# Patient Record
Sex: Male | Born: 1950 | Race: White | Hispanic: No | Marital: Married | Smoking: Never smoker
Health system: Southern US, Community
[De-identification: ages and names within clinical notes are randomized; demographics above are authoritative.]

## PROBLEM LIST (undated history)

## (undated) DIAGNOSIS — R011 Cardiac murmur, unspecified: Secondary | ICD-10-CM

## (undated) DIAGNOSIS — E785 Hyperlipidemia, unspecified: Secondary | ICD-10-CM

## (undated) DIAGNOSIS — Q631 Lobulated, fused and horseshoe kidney: Secondary | ICD-10-CM

## (undated) DIAGNOSIS — IMO0001 Reserved for inherently not codable concepts without codable children: Secondary | ICD-10-CM

## (undated) DIAGNOSIS — I2510 Atherosclerotic heart disease of native coronary artery without angina pectoris: Secondary | ICD-10-CM

## (undated) DIAGNOSIS — R269 Unspecified abnormalities of gait and mobility: Secondary | ICD-10-CM

## (undated) DIAGNOSIS — I10 Essential (primary) hypertension: Secondary | ICD-10-CM

## (undated) DIAGNOSIS — Z8709 Personal history of other diseases of the respiratory system: Secondary | ICD-10-CM

## (undated) DIAGNOSIS — M1990 Unspecified osteoarthritis, unspecified site: Secondary | ICD-10-CM

## (undated) DIAGNOSIS — I340 Nonrheumatic mitral (valve) insufficiency: Secondary | ICD-10-CM

## (undated) DIAGNOSIS — I499 Cardiac arrhythmia, unspecified: Secondary | ICD-10-CM

## (undated) DIAGNOSIS — I4892 Unspecified atrial flutter: Secondary | ICD-10-CM

## (undated) DIAGNOSIS — Z9889 Other specified postprocedural states: Secondary | ICD-10-CM

## (undated) DIAGNOSIS — T7840XA Allergy, unspecified, initial encounter: Secondary | ICD-10-CM

## (undated) DIAGNOSIS — I341 Nonrheumatic mitral (valve) prolapse: Secondary | ICD-10-CM

## (undated) DIAGNOSIS — I679 Cerebrovascular disease, unspecified: Secondary | ICD-10-CM

## (undated) HISTORY — DX: Allergy, unspecified, initial encounter: T78.40XA

## (undated) HISTORY — DX: Hyperlipidemia, unspecified: E78.5

## (undated) HISTORY — PX: COLONOSCOPY: SHX174

## (undated) HISTORY — PX: JOINT REPLACEMENT: SHX530

## (undated) HISTORY — PX: CARDIAC CATHETERIZATION: SHX172

## (undated) HISTORY — PX: SPINE SURGERY: SHX786

## (undated) HISTORY — DX: Nonrheumatic mitral (valve) insufficiency: I34.0

## (undated) HISTORY — DX: Atherosclerotic heart disease of native coronary artery without angina pectoris: I25.10

## (undated) HISTORY — DX: Essential (primary) hypertension: I10

## (undated) HISTORY — PX: OTHER SURGICAL HISTORY: SHX169

## (undated) HISTORY — DX: Cerebrovascular disease, unspecified: I67.9

## (undated) HISTORY — DX: Unspecified atrial flutter: I48.92

## (undated) HISTORY — PX: BACK SURGERY: SHX140

## (undated) HISTORY — DX: Unspecified osteoarthritis, unspecified site: M19.90

## (undated) HISTORY — DX: Other specified postprocedural states: Z98.890

## (undated) HISTORY — DX: Nonrheumatic mitral (valve) prolapse: I34.1

---
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
## 1998-05-09 ENCOUNTER — Encounter: Payer: Self-pay | Admitting: Family Medicine

## 1998-05-09 ENCOUNTER — Ambulatory Visit (HOSPITAL_COMMUNITY): Admission: RE | Admit: 1998-05-09 | Discharge: 1998-05-09 | Payer: Self-pay | Admitting: Family Medicine

## 2004-09-25 ENCOUNTER — Ambulatory Visit: Payer: Self-pay | Admitting: Family Medicine

## 2007-02-03 ENCOUNTER — Ambulatory Visit: Payer: Self-pay | Admitting: Family Medicine

## 2007-02-03 DIAGNOSIS — R011 Cardiac murmur, unspecified: Secondary | ICD-10-CM | POA: Insufficient documentation

## 2007-02-03 DIAGNOSIS — K921 Melena: Secondary | ICD-10-CM

## 2007-03-04 ENCOUNTER — Ambulatory Visit: Payer: Self-pay | Admitting: Gastroenterology

## 2007-03-10 ENCOUNTER — Ambulatory Visit: Payer: Self-pay

## 2007-03-10 ENCOUNTER — Encounter: Payer: Self-pay | Admitting: Gastroenterology

## 2007-03-10 ENCOUNTER — Encounter: Payer: Self-pay | Admitting: Family Medicine

## 2007-03-17 ENCOUNTER — Encounter: Payer: Self-pay | Admitting: Family Medicine

## 2007-03-17 ENCOUNTER — Encounter: Payer: Self-pay | Admitting: Gastroenterology

## 2007-03-17 ENCOUNTER — Ambulatory Visit: Payer: Self-pay | Admitting: Gastroenterology

## 2007-03-19 DIAGNOSIS — I080 Rheumatic disorders of both mitral and aortic valves: Secondary | ICD-10-CM | POA: Insufficient documentation

## 2007-03-25 ENCOUNTER — Telehealth (INDEPENDENT_AMBULATORY_CARE_PROVIDER_SITE_OTHER): Payer: Self-pay | Admitting: *Deleted

## 2007-03-31 ENCOUNTER — Ambulatory Visit: Payer: Self-pay | Admitting: Cardiology

## 2007-04-04 ENCOUNTER — Ambulatory Visit: Payer: Self-pay | Admitting: Cardiology

## 2007-04-04 ENCOUNTER — Ambulatory Visit: Payer: Self-pay

## 2007-04-04 LAB — CONVERTED CEMR LAB
Albumin: 4.1 g/dL (ref 3.5–5.2)
BUN: 18 mg/dL (ref 6–23)
Cholesterol: 244 mg/dL (ref 0–200)
Direct LDL: 180.5 mg/dL
GFR calc non Af Amer: 74 mL/min
HDL: 36.1 mg/dL — ABNORMAL LOW (ref >39.0)
Potassium: 4.2 meq/L (ref 3.5–5.1)
Sodium: 138 meq/L (ref 135–145)
Total CHOL/HDL Ratio: 6.8
VLDL: 31 mg/dL (ref 0–40)

## 2007-04-10 ENCOUNTER — Ambulatory Visit: Payer: Self-pay | Admitting: Cardiology

## 2007-04-10 ENCOUNTER — Encounter: Payer: Self-pay | Admitting: Cardiology

## 2007-04-10 ENCOUNTER — Ambulatory Visit (HOSPITAL_COMMUNITY): Admission: RE | Admit: 2007-04-10 | Discharge: 2007-04-10 | Payer: Self-pay | Admitting: Cardiology

## 2007-04-14 ENCOUNTER — Ambulatory Visit: Payer: Self-pay | Admitting: Cardiology

## 2007-04-21 ENCOUNTER — Ambulatory Visit: Payer: Self-pay | Admitting: Thoracic Surgery (Cardiothoracic Vascular Surgery)

## 2007-05-06 ENCOUNTER — Ambulatory Visit: Payer: Self-pay | Admitting: Cardiology

## 2007-05-06 LAB — CONVERTED CEMR LAB
Basophils Absolute: 0 10*3/uL (ref 0.0–0.1)
Basophils Relative: 0.2 % (ref 0.0–1.0)
Chloride: 105 meq/L (ref 96–112)
Creatinine, Ser: 1 mg/dL (ref 0.4–1.5)
Eosinophils Relative: 1.7 % (ref 0.0–5.0)
HCT: 40.9 % (ref 39.0–52.0)
INR: 1 (ref 0.8–1.0)
Neutrophils Relative %: 62.7 % (ref 43.0–77.0)
Prothrombin Time: 12 s (ref 10.9–13.3)
RBC: 4.5 M/uL (ref 4.22–5.81)
RDW: 11.8 % (ref 11.5–14.6)
Sodium: 139 meq/L (ref 135–145)
WBC: 4.6 10*3/uL (ref 4.5–10.5)
aPTT: 34.1 s — ABNORMAL HIGH (ref 21.7–29.8)

## 2007-05-08 ENCOUNTER — Ambulatory Visit: Payer: Self-pay | Admitting: Internal Medicine

## 2007-05-08 ENCOUNTER — Inpatient Hospital Stay (HOSPITAL_BASED_OUTPATIENT_CLINIC_OR_DEPARTMENT_OTHER): Admission: RE | Admit: 2007-05-08 | Discharge: 2007-05-08 | Payer: Self-pay | Admitting: Internal Medicine

## 2007-05-26 ENCOUNTER — Ambulatory Visit: Payer: Self-pay | Admitting: Thoracic Surgery (Cardiothoracic Vascular Surgery)

## 2007-05-26 ENCOUNTER — Ambulatory Visit: Payer: Self-pay | Admitting: Cardiology

## 2007-05-26 ENCOUNTER — Encounter: Payer: Self-pay | Admitting: Family Medicine

## 2007-05-26 LAB — CONVERTED CEMR LAB
Albumin: 4.1 g/dL (ref 3.5–5.2)
Total Bilirubin: 0.9 mg/dL (ref 0.3–1.2)
Total CHOL/HDL Ratio: 4.8
Total Protein: 6.9 g/dL (ref 6.0–8.3)
Triglycerides: 78 mg/dL (ref 0–149)

## 2007-06-09 ENCOUNTER — Ambulatory Visit: Payer: Self-pay | Admitting: Cardiology

## 2007-06-16 ENCOUNTER — Telehealth: Payer: Self-pay | Admitting: Family Medicine

## 2007-06-20 ENCOUNTER — Encounter: Payer: Self-pay | Admitting: Thoracic Surgery (Cardiothoracic Vascular Surgery)

## 2007-06-20 ENCOUNTER — Ambulatory Visit (HOSPITAL_COMMUNITY)
Admission: RE | Admit: 2007-06-20 | Discharge: 2007-06-20 | Payer: Self-pay | Admitting: Thoracic Surgery (Cardiothoracic Vascular Surgery)

## 2007-06-22 HISTORY — PX: MITRAL VALVE REPAIR: SHX2039

## 2007-06-23 ENCOUNTER — Encounter: Payer: Self-pay | Admitting: Family Medicine

## 2007-06-23 ENCOUNTER — Ambulatory Visit: Payer: Self-pay | Admitting: Thoracic Surgery (Cardiothoracic Vascular Surgery)

## 2007-06-24 ENCOUNTER — Ambulatory Visit: Payer: Self-pay | Admitting: Thoracic Surgery (Cardiothoracic Vascular Surgery)

## 2007-06-24 ENCOUNTER — Encounter: Payer: Self-pay | Admitting: Thoracic Surgery (Cardiothoracic Vascular Surgery)

## 2007-06-24 ENCOUNTER — Inpatient Hospital Stay (HOSPITAL_COMMUNITY)
Admission: RE | Admit: 2007-06-24 | Discharge: 2007-06-29 | Payer: Self-pay | Admitting: Thoracic Surgery (Cardiothoracic Vascular Surgery)

## 2007-06-24 ENCOUNTER — Ambulatory Visit: Payer: Self-pay | Admitting: Cardiology

## 2007-06-30 ENCOUNTER — Ambulatory Visit: Payer: Self-pay | Admitting: Internal Medicine

## 2007-07-04 ENCOUNTER — Ambulatory Visit: Payer: Self-pay | Admitting: Internal Medicine

## 2007-07-08 ENCOUNTER — Ambulatory Visit: Payer: Self-pay | Admitting: Cardiology

## 2007-07-14 ENCOUNTER — Ambulatory Visit: Payer: Self-pay | Admitting: Thoracic Surgery (Cardiothoracic Vascular Surgery)

## 2007-07-14 ENCOUNTER — Ambulatory Visit: Payer: Self-pay | Admitting: Cardiology

## 2007-07-23 ENCOUNTER — Ambulatory Visit: Payer: Self-pay | Admitting: Cardiology

## 2007-07-23 ENCOUNTER — Encounter: Admission: RE | Admit: 2007-07-23 | Discharge: 2007-07-23 | Payer: Self-pay | Admitting: Cardiothoracic Surgery

## 2007-07-25 ENCOUNTER — Ambulatory Visit: Payer: Self-pay | Admitting: Cardiothoracic Surgery

## 2007-07-31 ENCOUNTER — Encounter (HOSPITAL_COMMUNITY): Admission: RE | Admit: 2007-07-31 | Discharge: 2007-10-29 | Payer: Self-pay | Admitting: Cardiology

## 2007-08-04 ENCOUNTER — Ambulatory Visit: Payer: Self-pay | Admitting: Cardiology

## 2007-08-04 ENCOUNTER — Inpatient Hospital Stay (HOSPITAL_COMMUNITY): Admission: EM | Admit: 2007-08-04 | Discharge: 2007-08-12 | Payer: Self-pay | Admitting: Emergency Medicine

## 2007-08-07 ENCOUNTER — Encounter: Payer: Self-pay | Admitting: Cardiology

## 2007-08-08 ENCOUNTER — Ambulatory Visit: Payer: Self-pay | Admitting: Infectious Diseases

## 2007-08-11 ENCOUNTER — Encounter: Payer: Self-pay | Admitting: Infectious Diseases

## 2007-08-11 ENCOUNTER — Ambulatory Visit: Payer: Self-pay | Admitting: Cardiothoracic Surgery

## 2007-08-18 ENCOUNTER — Ambulatory Visit: Payer: Self-pay | Admitting: Cardiology

## 2007-08-18 LAB — CONVERTED CEMR LAB
Alkaline Phosphatase: 65 units/L (ref 39–117)
Bilirubin, Direct: 0.1 mg/dL (ref 0.0–0.3)
GFR calc Af Amer: 100 mL/min
GFR calc non Af Amer: 83 mL/min
Glucose, Bld: 93 mg/dL (ref 70–99)
Potassium: 4.5 meq/L (ref 3.5–5.1)
Sodium: 141 meq/L (ref 135–145)
Total Bilirubin: 0.7 mg/dL (ref 0.3–1.2)

## 2007-08-22 ENCOUNTER — Ambulatory Visit: Payer: Self-pay | Admitting: Cardiology

## 2007-09-01 ENCOUNTER — Ambulatory Visit: Payer: Self-pay | Admitting: Cardiology

## 2007-09-15 ENCOUNTER — Ambulatory Visit: Payer: Self-pay | Admitting: Cardiology

## 2007-09-15 ENCOUNTER — Ambulatory Visit: Payer: Self-pay | Admitting: Cardiovascular Disease

## 2007-09-15 LAB — CONVERTED CEMR LAB
ALT: 47 units/L (ref 0–53)
AST: 27 units/L (ref 0–37)
Albumin: 4.1 g/dL (ref 3.5–5.2)
Alkaline Phosphatase: 58 units/L (ref 39–117)
Total Protein: 7.4 g/dL (ref 6.0–8.3)

## 2007-09-26 ENCOUNTER — Ambulatory Visit: Payer: Self-pay | Admitting: Cardiology

## 2007-10-06 ENCOUNTER — Ambulatory Visit: Payer: Self-pay | Admitting: Cardiology

## 2007-10-06 LAB — CONVERTED CEMR LAB
Basophils Absolute: 0 10*3/uL (ref 0.0–0.1)
Lymphocytes Relative: 18.3 % (ref 12.0–46.0)
Monocytes Relative: 9.6 % (ref 3.0–12.0)
Platelets: 223 10*3/uL (ref 150–400)
RDW: 13.5 % (ref 11.5–14.6)

## 2007-10-15 ENCOUNTER — Ambulatory Visit: Payer: Self-pay | Admitting: Cardiology

## 2007-10-15 LAB — CONVERTED CEMR LAB
CO2: 21 meq/L (ref 19–32)
GFR calc Af Amer: 113 mL/min
Glucose, Bld: 112 mg/dL — ABNORMAL HIGH (ref 70–99)
Potassium: 4.6 meq/L (ref 3.5–5.1)
Sodium: 141 meq/L (ref 135–145)

## 2007-10-29 ENCOUNTER — Ambulatory Visit: Payer: Self-pay | Admitting: Cardiology

## 2007-10-30 ENCOUNTER — Encounter (HOSPITAL_COMMUNITY): Admission: RE | Admit: 2007-10-30 | Discharge: 2007-11-19 | Payer: Self-pay | Admitting: Cardiology

## 2007-11-23 ENCOUNTER — Ambulatory Visit: Payer: Self-pay | Admitting: Cardiology

## 2007-11-23 ENCOUNTER — Inpatient Hospital Stay (HOSPITAL_COMMUNITY): Admission: EM | Admit: 2007-11-23 | Discharge: 2007-11-25 | Payer: Self-pay | Admitting: Emergency Medicine

## 2007-11-24 ENCOUNTER — Ambulatory Visit: Payer: Self-pay | Admitting: Infectious Disease

## 2007-11-25 ENCOUNTER — Encounter: Payer: Self-pay | Admitting: Gastroenterology

## 2007-11-25 ENCOUNTER — Encounter: Payer: Self-pay | Admitting: Cardiology

## 2007-11-25 ENCOUNTER — Encounter (INDEPENDENT_AMBULATORY_CARE_PROVIDER_SITE_OTHER): Payer: Self-pay | Admitting: Emergency Medicine

## 2007-11-27 ENCOUNTER — Ambulatory Visit: Payer: Self-pay | Admitting: Gastroenterology

## 2007-12-03 ENCOUNTER — Encounter: Payer: Self-pay | Admitting: Family Medicine

## 2007-12-11 ENCOUNTER — Ambulatory Visit: Payer: Self-pay | Admitting: Cardiology

## 2007-12-11 LAB — CONVERTED CEMR LAB
ALT: 55 units/L — ABNORMAL HIGH (ref 0–53)
AST: 32 units/L (ref 0–37)
Albumin: 4.2 g/dL (ref 3.5–5.2)
BUN: 14 mg/dL (ref 6–23)
Basophils Absolute: 0 10*3/uL (ref 0.0–0.1)
Basophils Relative: 0.4 % (ref 0.0–3.0)
CO2: 26 meq/L (ref 19–32)
Calcium: 9.3 mg/dL (ref 8.4–10.5)
Chloride: 107 meq/L (ref 96–112)
Creatinine, Ser: 0.9 mg/dL (ref 0.4–1.5)
Eosinophils Relative: 2.6 % (ref 0.0–5.0)
Glucose, Bld: 103 mg/dL — ABNORMAL HIGH (ref 70–99)
Hemoglobin: 15.1 g/dL (ref 13.0–17.0)
Lymphocytes Relative: 29.9 % (ref 12.0–46.0)
MCHC: 35.3 g/dL (ref 30.0–36.0)
MCV: 87.5 fL (ref 78.0–100.0)
Neutro Abs: 2.5 10*3/uL (ref 1.4–7.7)
RBC: 4.9 M/uL (ref 4.22–5.81)
Total Bilirubin: 0.9 mg/dL (ref 0.3–1.2)
Total Protein: 7.1 g/dL (ref 6.0–8.3)
WBC: 4.3 10*3/uL — ABNORMAL LOW (ref 4.5–10.5)

## 2007-12-22 ENCOUNTER — Ambulatory Visit: Payer: Self-pay | Admitting: Infectious Disease

## 2007-12-22 DIAGNOSIS — I241 Dressler's syndrome: Secondary | ICD-10-CM

## 2007-12-22 DIAGNOSIS — Z9189 Other specified personal risk factors, not elsewhere classified: Secondary | ICD-10-CM | POA: Insufficient documentation

## 2007-12-22 LAB — CONVERTED CEMR LAB
ALT: 50 units/L (ref 0–53)
Albumin: 4.7 g/dL (ref 3.5–5.2)
CO2: 25 meq/L (ref 19–32)
CRP: 0.2 mg/dL (ref <0.6)
Glucose, Bld: 89 mg/dL (ref 70–99)
Lymphocytes Relative: 35 % (ref 12–46)
Lymphs Abs: 1.7 10*3/uL (ref 0.7–4.0)
Monocytes Relative: 10 % (ref 3–12)
Neutro Abs: 2.5 10*3/uL (ref 1.7–7.7)
Neutrophils Relative %: 52 % (ref 43–77)
Platelets: 184 10*3/uL (ref 150–400)
Potassium: 4 meq/L (ref 3.5–5.3)
RBC: 5 M/uL (ref 4.22–5.81)
Sed Rate: 3 mm/hr (ref 0–16)
Sodium: 140 meq/L (ref 135–145)
Total Bilirubin: 0.6 mg/dL (ref 0.3–1.2)
Total Protein: 7.2 g/dL (ref 6.0–8.3)
WBC: 4.8 10*3/uL (ref 4.0–10.5)

## 2007-12-24 ENCOUNTER — Encounter: Payer: Self-pay | Admitting: Infectious Disease

## 2007-12-25 DIAGNOSIS — Z86010 Personal history of colonic polyps: Secondary | ICD-10-CM

## 2007-12-29 ENCOUNTER — Ambulatory Visit: Payer: Self-pay | Admitting: Gastroenterology

## 2007-12-29 DIAGNOSIS — K760 Fatty (change of) liver, not elsewhere classified: Secondary | ICD-10-CM

## 2007-12-29 DIAGNOSIS — R740 Nonspecific elevation of levels of transaminase and lactic acid dehydrogenase [LDH]: Secondary | ICD-10-CM

## 2007-12-29 LAB — CONVERTED CEMR LAB
A-1 Antitrypsin, Ser: 156 mg/dL (ref 83–200)
AFP-Tumor Marker: 2.3 ng/mL (ref 0.0–8.0)
Ceruloplasmin: 35 mg/dL (ref 21–63)
Ferritin: 131 ng/mL (ref 22.0–322.0)
Iron: 119 ug/dL (ref 42–165)
Vitamin B-12: 773 pg/mL (ref 211–911)

## 2008-01-13 ENCOUNTER — Encounter: Payer: Self-pay | Admitting: Infectious Disease

## 2008-01-19 ENCOUNTER — Telehealth: Payer: Self-pay | Admitting: Gastroenterology

## 2008-01-23 ENCOUNTER — Ambulatory Visit: Payer: Self-pay | Admitting: Cardiology

## 2008-01-23 LAB — CONVERTED CEMR LAB
AST: 28 units/L (ref 0–37)
Albumin: 4.2 g/dL (ref 3.5–5.2)
BUN: 19 mg/dL (ref 6–23)
Basophils Absolute: 0 10*3/uL (ref 0.0–0.1)
Basophils Relative: 0.4 % (ref 0.0–3.0)
Calcium: 9 mg/dL (ref 8.4–10.5)
Creatinine, Ser: 1.1 mg/dL (ref 0.4–1.5)
Direct LDL: 190.7 mg/dL
Eosinophils Absolute: 0.1 10*3/uL (ref 0.0–0.7)
Eosinophils Relative: 2.1 % (ref 0.0–5.0)
GFR calc Af Amer: 90 mL/min
GFR calc non Af Amer: 74 mL/min
HCT: 42.5 % (ref 39.0–52.0)
HDL: 31.2 mg/dL — ABNORMAL LOW (ref >39.0)
MCHC: 34.8 g/dL (ref 30.0–36.0)
MCV: 89.4 fL (ref 78.0–100.0)
Monocytes Absolute: 0.4 10*3/uL (ref 0.1–1.0)
Neutro Abs: 2.5 10*3/uL (ref 1.4–7.7)
RBC: 4.75 M/uL (ref 4.22–5.81)
Total Bilirubin: 1.1 mg/dL (ref 0.3–1.2)
Triglycerides: 95 mg/dL (ref 0–149)
WBC: 4.5 10*3/uL (ref 4.5–10.5)

## 2008-03-17 ENCOUNTER — Ambulatory Visit: Payer: Self-pay | Admitting: Cardiology

## 2008-03-17 LAB — CONVERTED CEMR LAB
ALT: 82 units/L — ABNORMAL HIGH (ref 0–53)
AST: 51 units/L — ABNORMAL HIGH (ref 0–37)
Cholesterol: 176 mg/dL (ref 0–200)
HDL: 43.7 mg/dL (ref >39.0)
Total Bilirubin: 1 mg/dL (ref 0.3–1.2)
Total Protein: 7.1 g/dL (ref 6.0–8.3)
Triglycerides: 158 mg/dL — ABNORMAL HIGH (ref 0–149)

## 2008-03-22 ENCOUNTER — Ambulatory Visit: Payer: Self-pay | Admitting: Infectious Disease

## 2008-06-23 ENCOUNTER — Ambulatory Visit: Payer: Self-pay | Admitting: Cardiology

## 2008-06-23 LAB — CONVERTED CEMR LAB
ALT: 46 units/L (ref 0–53)
AST: 28 units/L (ref 0–37)
Albumin: 4.1 g/dL (ref 3.5–5.2)
Alkaline Phosphatase: 46 units/L (ref 39–117)
Cholesterol: 245 mg/dL (ref 0–200)
Total CHOL/HDL Ratio: 6.1

## 2008-07-05 ENCOUNTER — Ambulatory Visit: Payer: Self-pay | Admitting: Internal Medicine

## 2008-08-02 ENCOUNTER — Encounter: Payer: Self-pay | Admitting: Cardiology

## 2008-08-02 ENCOUNTER — Ambulatory Visit: Payer: Self-pay | Admitting: Cardiology

## 2008-08-04 ENCOUNTER — Ambulatory Visit: Payer: Self-pay | Admitting: Cardiology

## 2008-08-04 LAB — CONVERTED CEMR LAB
AST: 28 units/L (ref 0–37)
Albumin: 3.7 g/dL (ref 3.5–5.2)
Alkaline Phosphatase: 60 units/L (ref 39–117)
Total Protein: 6.8 g/dL (ref 6.0–8.3)

## 2008-08-16 ENCOUNTER — Ambulatory Visit: Payer: Self-pay

## 2008-08-16 ENCOUNTER — Ambulatory Visit: Payer: Self-pay | Admitting: Cardiology

## 2008-08-16 LAB — CONVERTED CEMR LAB
ALT: 37 units/L (ref 0–53)
AST: 24 units/L (ref 0–37)
Bilirubin, Direct: 0 mg/dL (ref 0.0–0.3)
Direct LDL: 158.4 mg/dL
Eosinophils Absolute: 0.1 10*3/uL (ref 0.0–0.7)
MCHC: 34.5 g/dL (ref 30.0–36.0)
MCV: 91.3 fL (ref 78.0–100.0)
Monocytes Absolute: 0.4 10*3/uL (ref 0.1–1.0)
Neutrophils Relative %: 52.7 % (ref 43.0–77.0)
Platelets: 202 10*3/uL (ref 150.0–400.0)
Total Bilirubin: 0.8 mg/dL (ref 0.3–1.2)
Triglycerides: 150 mg/dL — ABNORMAL HIGH (ref 0.0–149.0)

## 2008-08-30 ENCOUNTER — Ambulatory Visit: Payer: Self-pay | Admitting: Cardiology

## 2008-08-30 LAB — CONVERTED CEMR LAB
ALT: 37 units/L (ref 0–53)
Total Bilirubin: 1 mg/dL (ref 0.3–1.2)

## 2008-09-14 ENCOUNTER — Telehealth: Payer: Self-pay | Admitting: Cardiology

## 2008-09-18 IMAGING — CT CT ABDOMEN W/ CM
2 of 5 series · 17 of 46 positions shown, 19 images · IV contrast (APPLIED)
Comparison: none

CLINICAL DATA: Fever of unknown origin.  Postop from mitral valve replacement.  
ABDOMEN CT WITH CONTRAST:
TECHNIQUE: Multidetector CT imaging of the abdomen was performed following the standard protocol during bolus administration of intravenous contrast.
Contrast:  100 cc Omnipaque 300 and oral contrast.
TECHNIQUE: Multidetector CT imaging of the pelvis was performed following the standard protocol during bolus administration of intravenous contrast.

[Series 2: abd/pelv with 5.0 b31f st · axial · 0.80mm/px · z∈[-416,+14]mm · 14 of 96 slices shown, 16 images]
[im 5/96  soft-tissue]
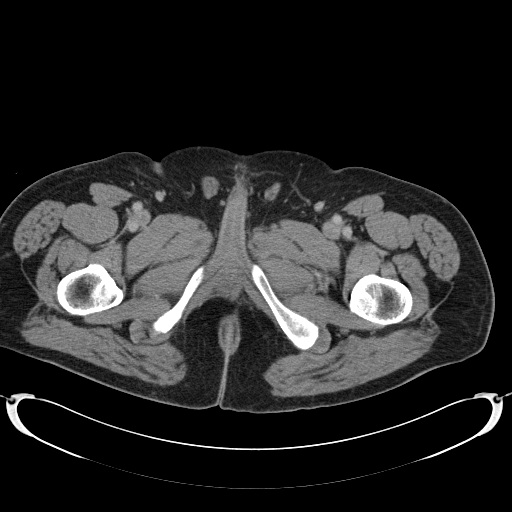
[im 5/96  bone]
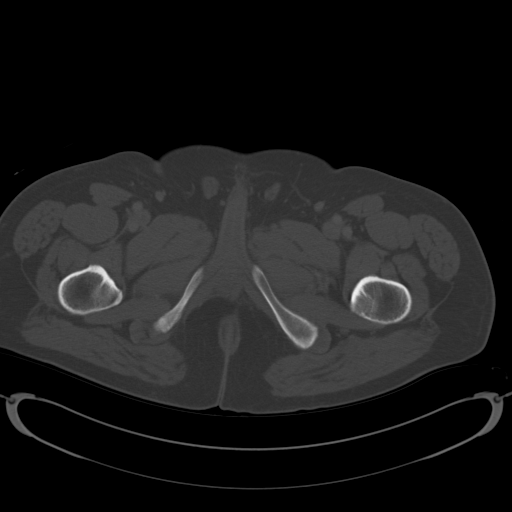
[im 15/96  soft-tissue]
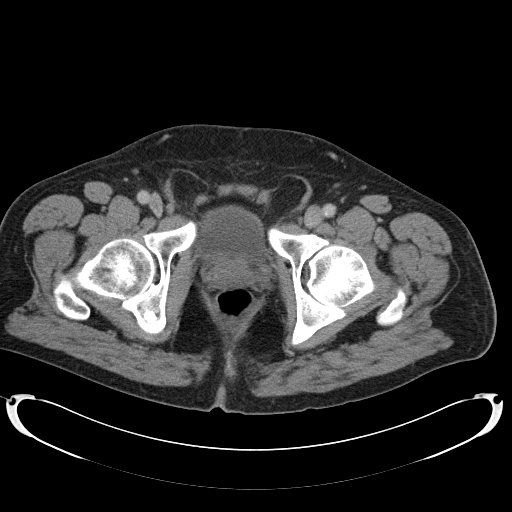
[im 20/96  soft-tissue]
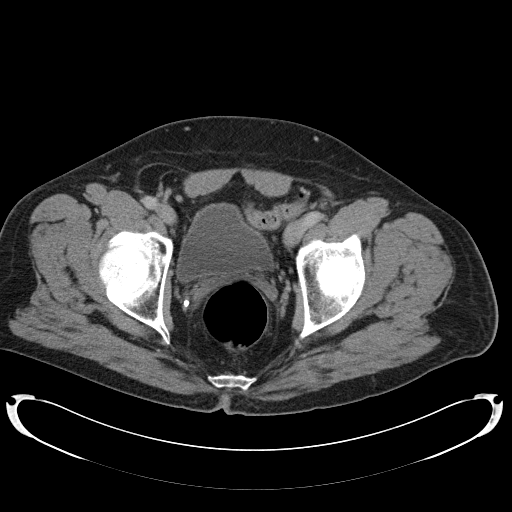
[im 24/96  soft-tissue]
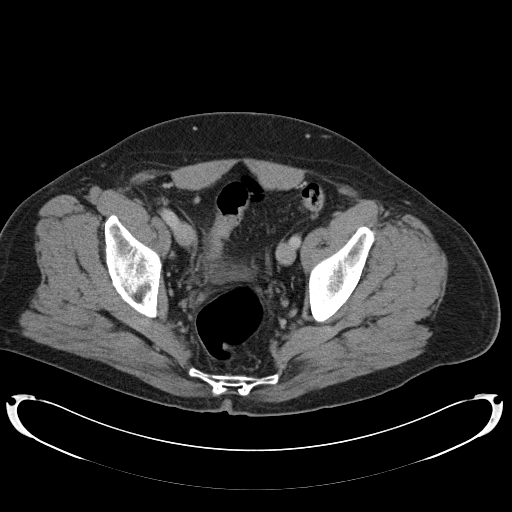
[im 34/96  soft-tissue]
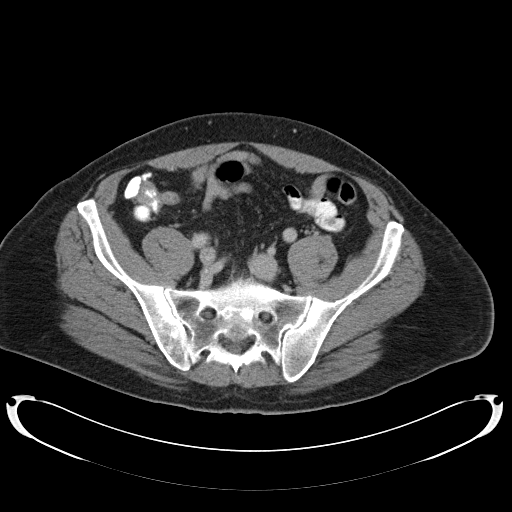
[im 39/96  soft-tissue]
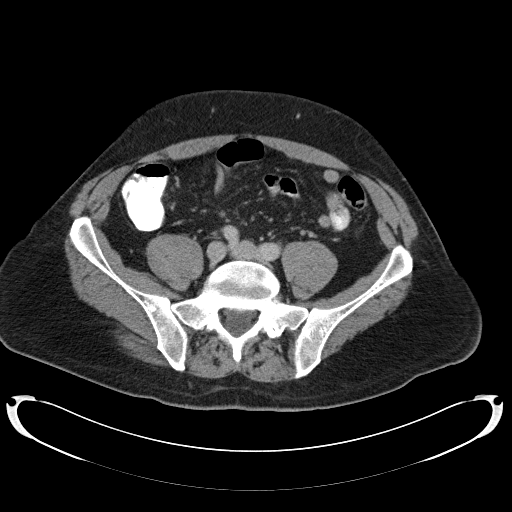
[im 43/96  soft-tissue]
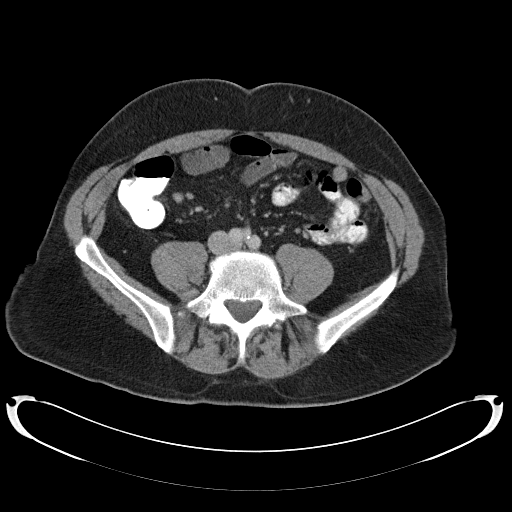
[im 53/96  soft-tissue]
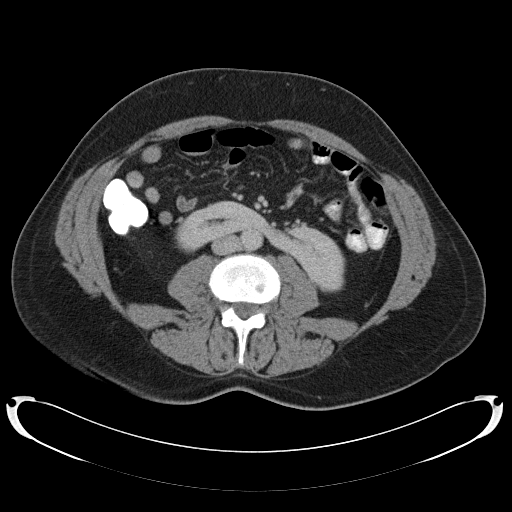
[im 58/96  soft-tissue]
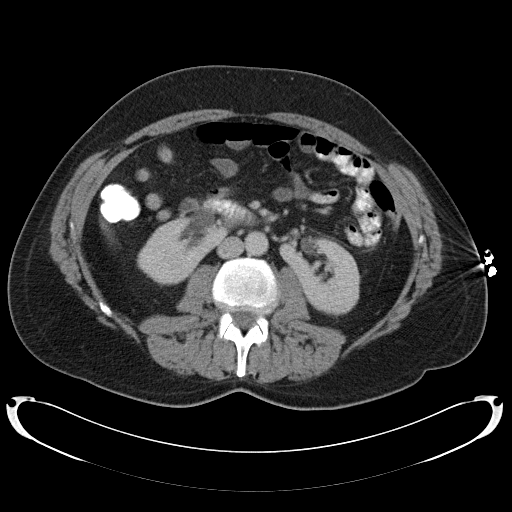
[im 58/96  bone]
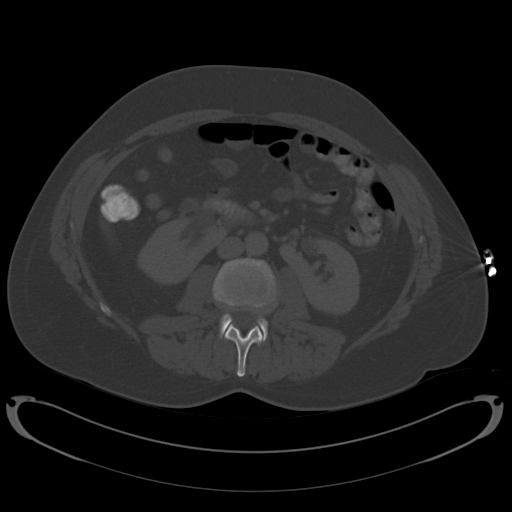
[im 62/96  soft-tissue]
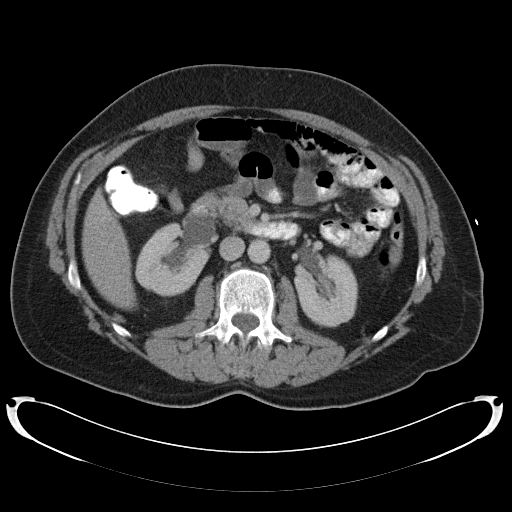
[im 72/96  soft-tissue]
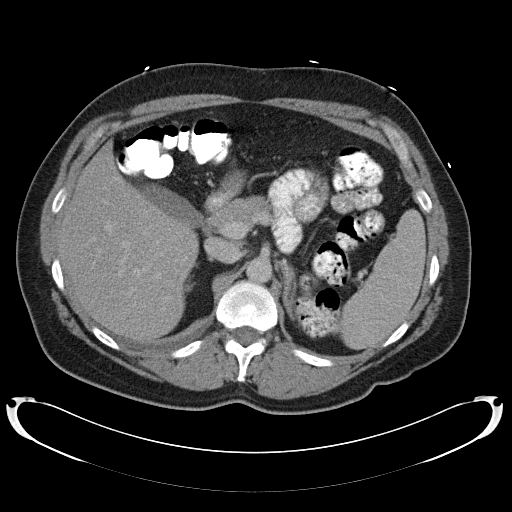
[im 77/96  soft-tissue]
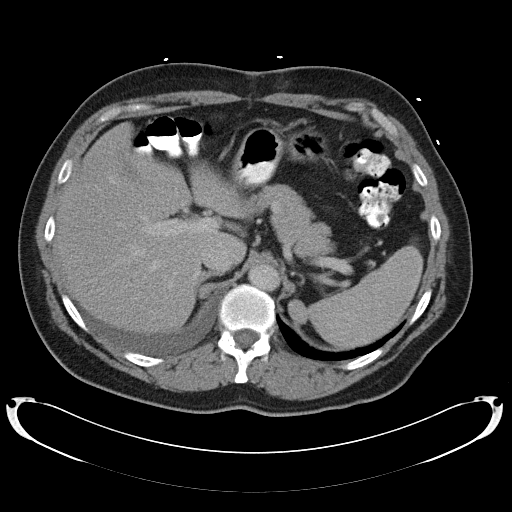
[im 81/96  soft-tissue]
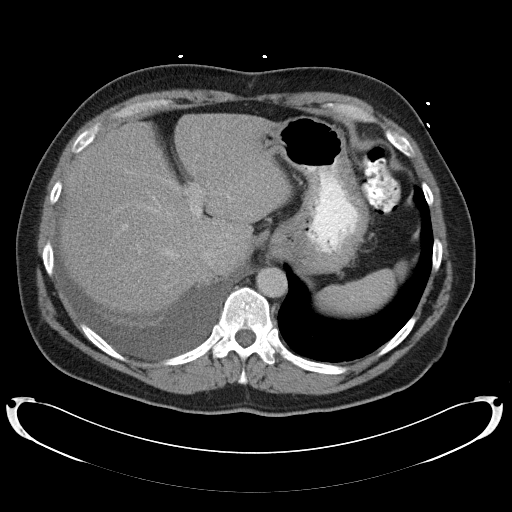
[im 91/96  soft-tissue]
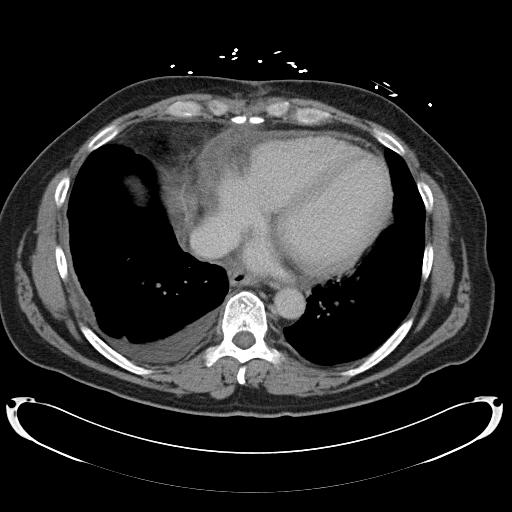

[Series 602: cor a/p · coronal · 0.93mm/px · 3 of 51 slices shown]
[im 17/51  soft-tissue]
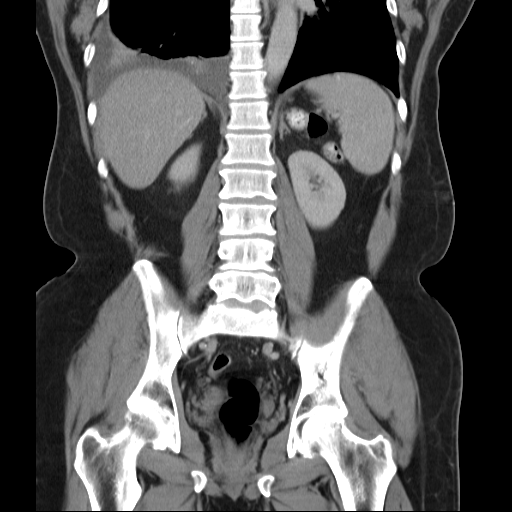
[im 23/51  soft-tissue]
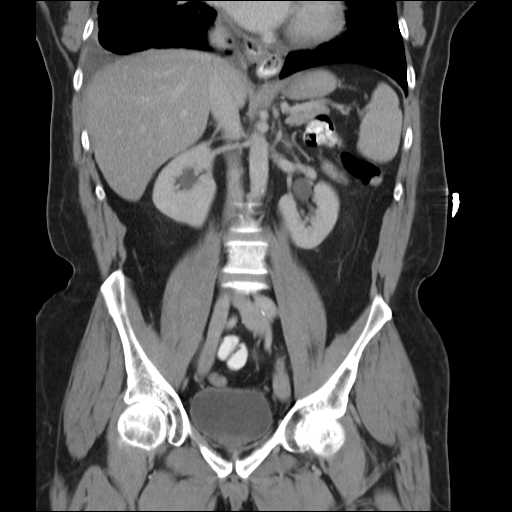
[im 28/51  soft-tissue]
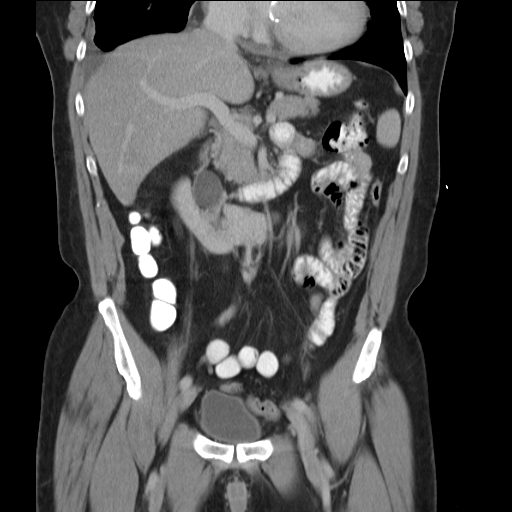

[17 of 46 positions shown; findings below may reference images not displayed]

FINDINGS: Small to moderate right pleural effusion is unchanged, however, small left pleural effusion has resolved since prior chest on 08/07/07.  
A horseshoe kidney is noted.  The other abdominal parenchymal organs are normal in appearance as well as the gallbladder.  There is no evidence of soft tissue masses or adenopathy.  There is no evidence of inflammatory process or abnormal fluid collections within the abdomen.
IMPRESSION: 1.  No evidence of abdominal abscess or inflammatory process. 
2.  Horseshoe kidney incidentally noted. 
3.  Small to moderate right pleural effusion.  
PELVIS CT WITH CONTRAST:
FINDINGS: There is no evidence of pelvic mass or lymphadenopathy.  There is no evidence of inflammatory process or abnormal fluid collections within the pelvis.  Pelvic bowel loops and other soft tissue structures are unremarkable in appearance.
IMPRESSION: Negative pelvis CT.

## 2008-09-27 ENCOUNTER — Ambulatory Visit: Payer: Self-pay | Admitting: Infectious Disease

## 2008-09-27 DIAGNOSIS — M1710 Unilateral primary osteoarthritis, unspecified knee: Secondary | ICD-10-CM | POA: Insufficient documentation

## 2008-10-26 ENCOUNTER — Telehealth: Payer: Self-pay | Admitting: Cardiology

## 2008-12-02 ENCOUNTER — Encounter (INDEPENDENT_AMBULATORY_CARE_PROVIDER_SITE_OTHER): Payer: Self-pay | Admitting: Cardiology

## 2009-03-29 ENCOUNTER — Telehealth: Payer: Self-pay | Admitting: Cardiology

## 2009-03-30 ENCOUNTER — Ambulatory Visit: Payer: Self-pay | Admitting: Cardiology

## 2009-03-31 LAB — CONVERTED CEMR LAB
AST: 62 units/L — ABNORMAL HIGH (ref 0–37)
Albumin: 4.1 g/dL (ref 3.5–5.2)
Cholesterol: 226 mg/dL — ABNORMAL HIGH (ref 0–200)
Direct LDL: 163.7 mg/dL
Total CHOL/HDL Ratio: 5
Triglycerides: 107 mg/dL (ref 0.0–149.0)
VLDL: 21.4 mg/dL (ref 0.0–40.0)

## 2009-04-26 ENCOUNTER — Ambulatory Visit: Payer: Self-pay | Admitting: Cardiology

## 2009-04-26 DIAGNOSIS — E7800 Pure hypercholesterolemia, unspecified: Secondary | ICD-10-CM

## 2009-04-27 LAB — CONVERTED CEMR LAB
Albumin: 4.2 g/dL (ref 3.5–5.2)
Alkaline Phosphatase: 58 units/L (ref 39–117)

## 2009-04-29 ENCOUNTER — Ambulatory Visit: Payer: Self-pay | Admitting: Family Medicine

## 2009-05-26 ENCOUNTER — Ambulatory Visit: Payer: Self-pay | Admitting: Gastroenterology

## 2009-05-26 DIAGNOSIS — L290 Pruritus ani: Secondary | ICD-10-CM | POA: Insufficient documentation

## 2009-05-27 LAB — CONVERTED CEMR LAB
ALT: 66 U/L — ABNORMAL HIGH
AST: 34 U/L
Albumin: 4.2 g/dL
Alkaline Phosphatase: 50 U/L
Bilirubin, Direct: 0.2 mg/dL
Total Bilirubin: 1.6 mg/dL — ABNORMAL HIGH
Total Protein: 7.6 g/dL

## 2009-12-28 ENCOUNTER — Ambulatory Visit: Payer: Self-pay | Admitting: Cardiology

## 2009-12-28 DIAGNOSIS — I679 Cerebrovascular disease, unspecified: Secondary | ICD-10-CM

## 2009-12-28 DIAGNOSIS — I2510 Atherosclerotic heart disease of native coronary artery without angina pectoris: Secondary | ICD-10-CM

## 2009-12-28 DIAGNOSIS — I6529 Occlusion and stenosis of unspecified carotid artery: Secondary | ICD-10-CM

## 2009-12-30 ENCOUNTER — Encounter (INDEPENDENT_AMBULATORY_CARE_PROVIDER_SITE_OTHER): Payer: Self-pay | Admitting: *Deleted

## 2010-01-12 ENCOUNTER — Ambulatory Visit: Payer: Self-pay | Admitting: Cardiology

## 2010-01-12 ENCOUNTER — Ambulatory Visit (HOSPITAL_COMMUNITY): Admission: RE | Admit: 2010-01-12 | Discharge: 2010-01-12 | Payer: Self-pay | Admitting: Cardiology

## 2010-01-12 ENCOUNTER — Ambulatory Visit: Payer: Self-pay

## 2010-01-12 ENCOUNTER — Encounter: Payer: Self-pay | Admitting: Cardiology

## 2010-04-17 ENCOUNTER — Telehealth: Payer: Self-pay | Admitting: Cardiology

## 2010-04-19 ENCOUNTER — Ambulatory Visit: Payer: Self-pay | Admitting: Family Medicine

## 2010-06-05 ENCOUNTER — Ambulatory Visit
Admission: RE | Admit: 2010-06-05 | Discharge: 2010-06-05 | Payer: Self-pay | Source: Home / Self Care | Attending: Family Medicine | Admitting: Family Medicine

## 2010-06-05 ENCOUNTER — Other Ambulatory Visit: Payer: Self-pay | Admitting: Family Medicine

## 2010-06-05 DIAGNOSIS — M79609 Pain in unspecified limb: Secondary | ICD-10-CM | POA: Insufficient documentation

## 2010-06-05 LAB — LDL CHOLESTEROL, DIRECT: Direct LDL: 222.4 mg/dL

## 2010-06-05 LAB — HEPATIC FUNCTION PANEL
ALT: 59 U/L — ABNORMAL HIGH (ref 0–53)
AST: 33 U/L (ref 0–37)
Albumin: 4.5 g/dL (ref 3.5–5.2)
Alkaline Phosphatase: 53 U/L (ref 39–117)
Bilirubin, Direct: 0.2 mg/dL (ref 0.0–0.3)
Total Bilirubin: 0.9 mg/dL (ref 0.3–1.2)
Total Protein: 7.2 g/dL (ref 6.0–8.3)

## 2010-06-05 LAB — BASIC METABOLIC PANEL
BUN: 21 mg/dL (ref 6–23)
CO2: 24 mEq/L (ref 19–32)
Calcium: 9.3 mg/dL (ref 8.4–10.5)
Chloride: 104 mEq/L (ref 96–112)
Creatinine, Ser: 1 mg/dL (ref 0.4–1.5)
GFR: 78.2 mL/min (ref >60.00)
Glucose, Bld: 96 mg/dL (ref 70–99)
Potassium: 4.4 mEq/L (ref 3.5–5.1)
Sodium: 137 mEq/L (ref 135–145)

## 2010-06-05 LAB — CBC WITH DIFFERENTIAL/PLATELET
Basophils Absolute: 0.1 10*3/uL (ref 0.0–0.1)
Basophils Relative: 1.1 % (ref 0.0–3.0)
Eosinophils Absolute: 0.1 10*3/uL (ref 0.0–0.7)
Eosinophils Relative: 2 % (ref 0.0–5.0)
HCT: 45.1 % (ref 39.0–52.0)
Hemoglobin: 15.6 g/dL (ref 13.0–17.0)
Lymphocytes Relative: 30.8 % (ref 12.0–46.0)
Lymphs Abs: 1.7 10*3/uL (ref 0.7–4.0)
MCHC: 34.5 g/dL (ref 30.0–36.0)
MCV: 92.1 fl (ref 78.0–100.0)
Monocytes Absolute: 0.4 10*3/uL (ref 0.1–1.0)
Monocytes Relative: 8.1 % (ref 3.0–12.0)
Neutro Abs: 3.2 10*3/uL (ref 1.4–7.7)
Neutrophils Relative %: 58 % (ref 43.0–77.0)
Platelets: 186 10*3/uL (ref 150.0–400.0)
RBC: 4.89 Mil/uL (ref 4.22–5.81)
RDW: 12.5 % (ref 11.5–14.6)
WBC: 5.5 10*3/uL (ref 4.5–10.5)

## 2010-06-05 LAB — CONVERTED CEMR LAB
Bilirubin Urine: NEGATIVE
Blood in Urine, dipstick: NEGATIVE
Glucose, Urine, Semiquant: NEGATIVE
Ketones, urine, test strip: NEGATIVE
Protein, U semiquant: NEGATIVE
Urobilinogen, UA: NEGATIVE
pH: 6

## 2010-06-05 LAB — LIPID PANEL
Cholesterol: 279 mg/dL — ABNORMAL HIGH (ref 0–200)
HDL: 49.1 mg/dL (ref >39.00)
Total CHOL/HDL Ratio: 6
Triglycerides: 163 mg/dL — ABNORMAL HIGH (ref 0.0–149.0)
VLDL: 32.6 mg/dL (ref 0.0–40.0)

## 2010-06-05 LAB — PSA: PSA: 0.85 ng/mL (ref 0.10–4.00)

## 2010-06-05 LAB — TSH: TSH: 1.1 u[IU]/mL (ref 0.35–5.50)

## 2010-06-07 ENCOUNTER — Telehealth (INDEPENDENT_AMBULATORY_CARE_PROVIDER_SITE_OTHER): Payer: Self-pay | Admitting: *Deleted

## 2010-06-10 ENCOUNTER — Encounter: Payer: Self-pay | Admitting: Family Medicine

## 2010-06-11 ENCOUNTER — Encounter: Payer: Self-pay | Admitting: Cardiothoracic Surgery

## 2010-06-14 ENCOUNTER — Telehealth: Payer: Self-pay | Admitting: Cardiology

## 2010-06-18 LAB — CONVERTED CEMR LAB
ALT: 32 units/L (ref 0–53)
AST: 23 units/L (ref 0–37)
Albumin: 4.4 g/dL (ref 3.5–5.2)
Alkaline Phosphatase: 51 units/L (ref 39–117)
Bilirubin, Direct: 0.1 mg/dL (ref 0.0–0.3)
Bilirubin, Direct: 0.2 mg/dL (ref 0.0–0.3)
Blood in Urine, dipstick: NEGATIVE
Calcium: 9.3 mg/dL (ref 8.4–10.5)
Chloride: 106 meq/L (ref 96–112)
Creatinine, Ser: 1.1 mg/dL (ref 0.4–1.5)
Eosinophils Absolute: 0.1 10*3/uL (ref 0.0–0.6)
Eosinophils Relative: 0.9 % (ref 0.0–5.0)
GFR calc non Af Amer: 74 mL/min
Glucose, Bld: 94 mg/dL (ref 70–99)
HCT: 42.5 % (ref 39.0–52.0)
HDL: 43.8 mg/dL (ref >39.0)
Ketones, urine, test strip: NEGATIVE
MCV: 90.5 fL (ref 78.0–100.0)
Nitrite: NEGATIVE
Platelets: 189 10*3/uL (ref 150–400)
Protein, U semiquant: NEGATIVE
RBC: 4.7 M/uL (ref 4.22–5.81)
RDW: 11.8 % (ref 11.5–14.6)
Specific Gravity, Urine: 1.01
Total Bilirubin: 0.9 mg/dL (ref 0.3–1.2)
Total Bilirubin: 1.5 mg/dL — ABNORMAL HIGH (ref 0.3–1.2)
Total CHOL/HDL Ratio: 5.8
Total CHOL/HDL Ratio: 6
Total Protein: 6.9 g/dL (ref 6.0–8.3)
Triglycerides: 100 mg/dL (ref 0–149)
Urobilinogen, UA: NEGATIVE
VLDL: 20 mg/dL (ref 0–40)
VLDL: 34.4 mg/dL (ref 0.0–40.0)
WBC Urine, dipstick: NEGATIVE
WBC: 6.8 10*3/uL (ref 4.5–10.5)

## 2010-06-20 NOTE — Assessment & Plan Note (Signed)
Summary: f1y per pt call/lg   Referring Provider:  Kirk Ruths, MD Primary Provider:  Garnet Koyanagi, DO   CC:  check up.  History of Present Illness: [name] is a pleasant gentleman who has a history of mitral valve repair secondary to severe mitral regurgitation in February 2009.  He has also had atrial flutter ablation in March 2009.  Postoperatively, he had recurrent fevers that were felt secondary to a postsurgical inflammatory state. I last saw him in March of 2010. Since then, the patient denies any dyspnea on exertion, orthopnea, PND, pedal edema, palpitations, syncope or chest pain.   Current Medications (verified): 1)  Centrum Silver   Tabs (Multiple Vitamins-Minerals) .Marland Kitchen.. 1 By Mouth Once Daily 2)  Aspirin Ec 81 Mg  Tbec (Aspirin) .... Take 1 Tablet By Mouth Once A Day 3)  Fish Oil   Oil (Fish Oil) 125m ..Marland Kitchen. 1 Tab By Mouth Once Daily 4)  Glucosamine-Chondroitin   Caps (Glucosamine-Chondroit-Vit C-Mn) ..Marland Kitchen. 1 Tab By Mouth Once Daily 5)  Saw Palmetto ..Marland Kitchen. 1 Tab By Mouth Qd  Allergies: 1)  Codeine  Past History:  Past Medical History: MVP Severe MR with mitral valve repair in March, 2009.  Postoperative atrial flutter s/p ablation hypertension hyperlipidemia coronary artery disease. sp Catheterization in February 2009, showed 40% left main.  cerebrovascular disease with a 59% right ICA, 49% left ICA, and history TIA Recurrent fevers ? Dresslers     Past Surgical History: Reviewed history from 05/26/2009 and no changes required. Tonsillectomy 1960 Mitral Valve Repair-2009 (Feb)  Social History: Reviewed history from 05/26/2009 and no changes required. 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 Remaining systems are negative.   Vital Signs:  Patient profile:   60year old male Height:      68 inches Weight:      211 pounds BMI:     32.20 Pulse rate:   80 / minute Resp:     14 per minute BP sitting:   135 / 85  (left arm)  Vitals Entered By: KBurnett Kanaris(December 28, 2009 9:38 AM)  Physical Exam  General:  Well-developed well-nourished in no acute distress.  Skin is warm and dry.  HEENT is normal.  Neck is supple. No thyromegaly.  Chest is clear to auscultation with normal expansion.  Cardiovascular exam is regular rate and rhythm. 2/6 systolic murmur at the apex. Abdominal exam nontender or distended. No masses palpated. Extremities show no edema. neuro grossly intact    EKG  Procedure date:  12/28/2009  Findings:      Sinus rhythm at a rate of 73. First degree AV block. RV conduction delay. No ST changes.  Impression & Recommendations:  Problem # 1:  HYPERLIPIDEMIA, FAMILIAL (ICD-272.0)  Discussed importance of diet. He cannot take statins as they have caused increased liver functions previously.  Orders: TLB-Lipid Panel (80061-LIPID) TLB-Hepatic/Liver Function Pnl (80076-HEPATIC)  Problem # 2:  MITRAL REGURGITATION, SEVERE (ICD-396.3)  Status post mitral valve repair. Repeat echocardiogram. Continued SBE prophylaxis.  Orders: Echocardiogram (Echo)  Problem # 3:  CEREBROVASCULAR DISEASE (ICD-437.9) Continue aspirin. Followup carotid Dopplers.  Problem # 4:  CAD (ICD-414.00) Continue aspirin. His updated medication list for this problem includes:    Aspirin Ec 81 Mg  Tbec (Aspirin) .Marland Kitchen... Take 1 tablet by mouth once a day  Other Orders: Carotid Duplex (Carotid Duplex)  Patient Instructions: 1)  Your physician recommends that you schedule a follow-up appointment in: ONE YEAR 2)  Your physician has requested that you have a carotid duplex. This test is an ultrasound of the carotid arteries in your  neck. It looks at blood flow through these arteries that supply the brain with blood. Allow one hour for this exam. There are no restrictions or special instructions. 3)  Your physician has requested that you have an echocardiogram.  Echocardiography is a painless test that uses sound waves to create images of your heart. It provides your doctor with information about the size and shape of your heart and how well your heart's chambers and valves are working.  This procedure takes approximately one hour. There are no restrictions for this procedure.

## 2010-06-20 NOTE — Assessment & Plan Note (Signed)
Summary: ELEVATED LIVER ENZYNES/YF   History of Present Illness Visit Type: consult Primary GI MD: Verl Blalock MD FACP [place] Primary Provider: Garnet Koyanagi, DO  Requesting Provider: Kirk Ruths, MD Chief Complaint: elevated liver enzymes, pt has recently been on several different cholesterol medications History of Present Illness:   [name] is asymptomatic and denies abdominal pain or gastrointestinal symptoms. He does complain of some anal pruritus but otherwise is having normal bowel movements without melena or hematochezia. He had been followed over the last 2 years because of abnormal liver function tests which are periodic in nature and related to statin use. He has a history of chronic mild hypercholesterolemia but there is no family history of coronary artery disease or early death. He is followed by Dr. Herb Grays in cardiology.   GI Review of Systems    Reports belching.      Denies abdominal pain, acid reflux, bloating, chest pain, dysphagia with liquids, dysphagia with solids, heartburn, loss of appetite, nausea, vomiting, vomiting blood, weight loss, and  weight gain.        Denies anal fissure, black tarry stools, change in bowel habit, constipation, diarrhea, diverticulosis, fecal incontinence, heme positive stool, hemorrhoids, irritable bowel syndrome, jaundice, light color stool, liver problems, rectal bleeding, and  rectal pain.    Current Medications (verified): 1)  Centrum Silver   Tabs (Multiple Vitamins-Minerals) .Marland Kitchen.. 1 By Mouth Once Daily 2)  Aspirin Ec 81 Mg  Tbec (Aspirin) .... Take 1 Tablet By Mouth Once A Day 3)  Fish Oil   Oil (Fish Oil) 1231m ..Marland Kitchen. 1 Tab By Mouth Once Daily 4)  Glucosamine-Chondroitin   Caps (Glucosamine-Chondroit-Vit C-Mn) ..Marland Kitchen. 1 Tab By Mouth Once Daily 5)  Saw Palmetto ..Marland Kitchen. 1 Tab By Mouth Qd  Allergies (verified): 1)  Codeine  Past History:  Past medical, surgical, family and social histories (including risk factors) reviewed for  relevance to current acute and chronic problems.  Past Medical History: MVP Severe MR with mitral valve repair in   March, 2009.  Postoperative atrial flutter for which he will   underwent ablation.   hypertension,  hyperlipidemia coronary artery disease. sp Catheterization in February 2009, showed 40% left main.   cerebrovascular disease with a 59% right ICA, 49% left ICA, and history TIA Recurrent fevers ? Dresslers History of left pleural effusion    Past Surgical History: Tonsillectomy 1960 Mitral Valve Repair-2009 (Feb)  Family History: Reviewed history from 07/30/2008 and no changes required. Family History Diabetes 1st degree relative Family History Liver disease-Father (cancer) Family History High cholesterol: Father No FH of Colon Cancer: Family History of Colitis/Crohn's: Daughter ?Crohns  Social History: Reviewed history from 12/29/2007 and no changes required. 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 Drug use-no Regular exercise-yes Alcohol Use - yes-occasional  Review of Systems  The patient denies allergy/sinus, anemia, anxiety-new, arthritis/joint pain, back pain, blood in urine, breast changes/lumps, confusion, cough, coughing up blood, depression-new, fainting, fatigue, fever, headaches-new, hearing problems, heart murmur, heart rhythm changes, itching, muscle pains/cramps, night sweats, nosebleeds, shortness of breath, skin rash, sleeping problems, sore throat, swelling of feet/legs, swollen lymph glands, thirst - excessive, urination - excessive, urination changes/pain, urine leakage, vision changes, and voice change.    Vital Signs:  Patient profile:   60year old male Height:      68.5 inches Weight:      212 pounds BMI:     31.88 Pulse rate:   68 / minute Pulse rhythm:  regular BP sitting:   120 / 84  (left arm) Cuff size:   regular  Vitals Entered By: Abelino Derrick CMA Deborra Medina) (May 26, 2009 11:24  AM)  Physical Exam  General:  Well developed, well nourished, no acute distress.healthy appearing.   Head:  Normocephalic and atraumatic. Eyes:  PERRLA, no icterus. Abdomen:  Soft, nontender and nondistended. No masses, hepatosplenomegaly or hernias noted. Normal bowel sounds. Rectal:  Normal exam.No evidence of hemorrhoids or perirectal rashes, fistulae or fissures. Extremities:  No clubbing, cyanosis, edema or deformities noted. Neurologic:  Alert and  oriented x4;  grossly normal neurologically. Psych:  Alert and cooperative. Normal mood and affect.   Impression & Recommendations:  Problem # 1:  FATTY LIVER DISEASE (ICD-571.8) Assessment Unchanged Review of his liver enzymes and details initiated he has hyper transaminasemia associated with statin use. Liver workup otherwise been unremarkable. A repeat his liver profile this time. He probably needs to avoid statin medications but is being considered for niacin therapy by cardiology. Orders: TLB-Hepatic/Liver Function Pnl (80076-HEPATIC)  Problem # 2:  PRURITUS ANI (ICD-698.0) Assessment: New  Balneol cleansing of his perirectal area twice a day as tolerated. He is up-to-date on his colonoscopy exams.  Patient Instructions: 1)  Copy sent to : Dr. Kirk Ruths in cardiology and Dr.  Garnet Koyanagi 2)  Please continue current medications.  3)  repeat liver enzymes ordered today  Appended Document: ELEVATED LIVER ENZYNES/YF    Clinical Lists Changes  Medications: Added new medication of BALNEOL  [facility]) Apply bid

## 2010-06-20 NOTE — Letter (Signed)
Summary: [place] [place] [address] [place]   [place], [place] [address]   Phone: [phone]  Fax: [phone]     December 30, 2009 MRN: [id number]   [facility] [place], [place]  [address]   Dear Mr. CAMMARATA,  We have reviewed your cholesterol results.  They are as follows:     Total Cholesterol:    242 (Desirable: less than 200)       HDL  Cholesterol:     43.90  (Desirable: greater than 40 for men and 50 for women)       LDL Cholesterol:       177.7 (Desirable: less than 100 for low risk and less than 70 for moderate to high risk)       Triglycerides:       172.0  (Desirable: less than 150)  Our recommendations include:Make sure you follow up with Dr Sharlett Iles. Continue to watch a low cholestrol diet. I included some info that may help! Take care, Dr Lyda Kalata     Call our office at the number listed above if you have any questions.  Lowering your LDL cholesterol is important, but it is only one of a large number of "risk factors" that may indicate that you are at risk for heart disease, stroke or other complications of hardening of the arteries.  Other risk factors include:   A.  Cigarette Smoking* B.  High Blood Pressure* C.  Obesity* D.   Low HDL Cholesterol (see yours above)* E.   Diabetes Mellitus (higher risk if your is uncontrolled) F.  Family history of premature heart disease G.  Previous history of stroke or cardiovascular disease    *These are risk factors YOU HAVE CONTROL OVER.  For more information, visit .  There is now evidence that lowering the TOTAL CHOLESTEROL AND LDL CHOLESTEROL can reduce the risk of heart disease.  The American Heart Association recommends the following guidelines for the treatment of elevated cholesterol:  1.  If there is now current heart disease and less than two risk factors, TOTAL CHOLESTEROL should be less than 200 and LDL CHOLESTEROL should be less than 100. 2.   If there is current heart disease or two or more risk factors, TOTAL CHOLESTEROL should be less than 200 and LDL CHOLESTEROL should be less than 70.  A diet low in cholesterol, saturated fat, and calories is the cornerstone of treatment for elevated cholesterol.  Cessation of smoking and exercise are also important in the management of elevated cholesterol and preventing vascular disease.  Studies have shown that 30 to 60 minutes of physical activity most days can help lower blood pressure, lower cholesterol, and keep your weight at a healthy level.  Drug therapy is used when cholesterol levels do not respond to therapeutic lifestyle changes (smoking cessation, diet, and exercise) and remains unacceptably high.  If medication is started, it is important to have you levels checked periodically to evaluate the need for further treatment options.  Thank you,    [facility]

## 2010-06-20 NOTE — Progress Notes (Signed)
Summary: wants pcp referral  Phone Note Call from Patient   Caller: Patient [phone] Reason for Call: Talk to Nurse Summary of Call: pt calling to request a referral to pcp from dr Stanford Breed Initial call taken by: Lorenda Hatchet,  April 17, 2010 8:23 AM  Follow-up for Phone Call        spoke with pt, he would prefer to see a male primary care md and wants to know who dr Stanford Breed would recommend. will foward for dr Stanford Breed review [name], RN  April 17, 2010 1:20 PM\par  Additional Follow-up for Phone Call Additional follow up Details #1::        Dr Shawna Orleans in [place], MD, [facility]  April 18, 2010 9:26 AM  pt aware [name], RN  April 18, 2010 9:42 AM

## 2010-06-20 NOTE — Assessment & Plan Note (Signed)
Summary: FLU SHOT/KN  Nurse Visit Flu Vaccine Consent Questions     Do you have a history of severe allergic reactions to this vaccine? no    Any prior history of allergic reactions to egg and/or gelatin? no    Do you have a sensitivity to the preservative Thimersol? no    Do you have a past history of Guillan-Barre Syndrome? no    Do you currently have an acute febrile illness? no    Have you ever had a severe reaction to latex? no    Vaccine information given and explained to patient? yes    Are you currently pregnant? no    Lot Number:[id number]   Exp Date:11/18/2010   Site Given  Left Deltoid IM   Allergies: 1)  Codeine  Orders Added: 1)  Flu Vaccine 30yr + MEDICARE PATIENTS [Q2039] 2)  Administration Flu vaccine - MCR [[[id number]]

## 2010-06-22 NOTE — Progress Notes (Signed)
Summary: pt has med question  Phone Note Call from Patient   Caller: Patient [phone] Reason for Call: Talk to Nurse Summary of Call: pt given meloxicam 15 mg 1 a day-is this ok to take Initial call taken by: Lorenda Hatchet,  June 14, 2010 11:03 AM  Follow-up for Phone Call        spoke w/pt and he is concerned after reading the precautions/side effects of meds. it cautions against taking low dose ASA and those w/heart disease. he would like Dr. Stanford Breed to review and ok for him to take med. Adv MD out of office until am and he expressed understanding.  Follow-up by: Joan Flores RN,  June 14, 2010 12:22 PM  Additional Follow-up for Phone Call Additional follow up Details #1::        would use tylenol if possible Colin Mulders, MD, [facility]  June 14, 2010 2:09 PM  pt aware [name], RN  June 14, 2010 2:45 PM

## 2010-06-22 NOTE — Progress Notes (Signed)
Summary: Results- lmom 1/18--call spouse--left number  Phone Note Outgoing Call   Call placed by: Aron Baba CMA Deborra Medina),  June 07, 2010 2:46 PM Call placed to: Patient Details for Reason: Cholesterol per cardiology----per pt pt did switch from otc fish oil to lovaza--- fax lipid to cardiology--Dr Crenshaw  rest of labs are normal.     Summary of Call: Left message to call back.... Aron Baba CMA Deborra Medina)  June 07, 2010 2:47 PM   Follow-up for Phone Call        wife Thayer Headings called --please return call to her at [phone].Berneta Sages  June 07, 2010 2:56 PM  spoke with Thayer Headings and advise her because of HIPAA I would not be able to give her his results, she voiced understanding and stated she would have him give [place] a call back.... Aron Baba CMA Deborra Medina)  June 07, 2010 4:21 PM    Additional Follow-up for Phone Call Additional follow up Details #1::        spoke with patient and he stated it was ok to discuss all his information with his wife from now on,....Gave him the results, he voiced understanding and requested a copy Additional Follow-up by: Aron Baba CMA Deborra Medina),  June 07, 2010 4:31 PM

## 2010-06-22 NOTE — Assessment & Plan Note (Signed)
Summary: CPX/FASTING/KN   Vital Signs:  Patient profile:   60 year old male Height:      68 inches Weight:      213.4 pounds Pulse rate:   76 / minute Pulse rhythm:   regular BP sitting:   136 / 74  (left arm) Cuff size:   regular  Vitals Entered By: Aron Baba CMA Deborra Medina) (June 05, 2010 9:14 AM) CC: CPX/Fasting--EKG done 8/11 with Dr.Crenshaw   History of Present Illness: Pt here for cpe. Pt c/o leaking urine on occassion just a few minutes after emptying his bladder.  He is taking saw pawmetto.  No other complaints.   Pt has been on several statins and zetia---they all elevated his LFTS.   Pt c/o pain left hand---pt cut hand as a child and had stitches---now he has knot in area and has trouble bending his pinky on his right hand.    Preventive Screening-Counseling & Management  Alcohol-Tobacco     Alcohol drinks/day: 0     Smoking Status: never     Passive Smoke Exposure: no  Caffeine-Diet-Exercise     Caffeine use/day: 3     Does Patient Exercise: yes     Type of exercise: total gym     Times/week: 5-7  Hep-HIV-STD-Contraception     HIV Risk: no     Dental Visit-last 6 months yes     Dental Care Counseling: not indicated; dental care within six months  Safety-Violence-Falls     Seat Belt Use: 100      Sexual History:  currently monogamous.    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 1 By Mouth Once Daily 2)  Aspirin Ec 81 Mg  Tbec (Aspirin) .... Take 1 Tablet By Mouth Once A Day 3)  Fish Oil   Oil (Fish Oil) 1220m ..Marland Kitchen. 1 Tab By Mouth Once Daily 4)  Glucosamine-Chondroitin   Caps (Glucosamine-Chondroit-Vit C-Mn) ..Marland Kitchen. 1 Tab By Mouth Once Daily 5)  Saw Palmetto ..Marland Kitchen. 1 Tab By Mouth Qd  Current Medications (verified): 1)  Centrum Silver   Tabs (Multiple Vitamins-Minerals) ..Marland Kitchen. 1 By Mouth Once Daily 2)  Aspirin Ec 81 Mg  Tbec (Aspirin) .... Take 1 Tablet By Mouth Once A Day 3)  Lovaza 1 Gm Caps (Omega-3-Acid Ethyl Esters) .... 2 By Mouth Two Times A Day 4)  Saw Palmetto ..Marland Kitchen. 1 Tab By Mouth Qd 5)  Enablex 7.5 Mg Xr24h-Tab (Darifenacin Hydrobromide) ..Marland Kitchen. 1 By Mouth Once Daily  Allergies (verified): 1)  Codeine  Past History:  Past Medical History: Last updated: 12/28/2009 MVP Severe MR with mitral valve repair in March, 2009.  Postoperative atrial flutter s/p ablation hypertension hyperlipidemia coronary artery  disease. sp Catheterization in February 2009, showed 40% left main.  cerebrovascular disease with a 59% right ICA, 49% left ICA, and history TIA Recurrent fevers ? 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 Family History Diabetes 1st degree relative Family History Liver disease-Father (cancer) Family History High cholesterol: Father No FH of Colon Cancer: Family History of Colitis/Crohn's: Daughter ?Crohns  Social History: Reviewed history from 05/26/2009 and no changes required. 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 Eyes:  Denies blurring, discharge, double vision, eye irritation, eye pain, halos, itching, light sensitivity, red eye, vision loss-1 eye, and vision loss-both eyes; optho-- q1y. ENT:  Denies decreased hearing, difficulty swallowing, ear discharge, earache, hoarseness, nasal congestion, nosebleeds, postnasal drainage, ringing in ears, sinus pressure, and sore throat. CV:  Denies bluish discoloration of lips or nails, chest pain or discomfort, difficulty breathing at night, difficulty breathing while lying down, fainting, fatigue, leg cramps with exertion, lightheadness, near fainting, palpitations, shortness of breath with exertion, swelling of feet, swelling of hands, and weight gain. Resp:  Denies chest discomfort, chest pain with inspiration, cough, coughing up blood, excessive snoring, hypersomnolence, morning headaches, pleuritic, shortness of breath, sputum productive, and wheezing. GI:  Denies abdominal pain, bloody stools, change in bowel habits, constipation, dark tarry stools,  diarrhea, excessive appetite, gas, hemorrhoids, indigestion, loss of appetite, nausea, vomiting, vomiting blood, and yellowish skin color. GU:  Complains of incontinence; denies decreased libido, discharge, dysuria, erectile dysfunction, genital sores, hematuria, nocturia, urinary frequency, and urinary hesitancy. MS:  Denies joint pain, joint redness, joint swelling, loss of strength, low back pain, mid back pain, muscle aches, muscle , cramps, muscle weakness, stiffness, and thoracic pain; R hand pain. Derm:  Denies changes in color of skin, changes in nail beds, dryness, excessive perspiration, flushing, hair loss, insect bite(s), itching, lesion(s), poor wound healing, and rash. Neuro:  Denies brief paralysis, difficulty with concentration, disturbances in coordination, falling down, headaches, inability to speak, memory loss, numbness, poor balance, seizures, sensation of room spinning, tingling, tremors, visual disturbances, and weakness. Psych:  Denies alternate hallucination ( auditory/visual), anxiety, depression, easily angered, easily tearful, irritability, mental problems, panic attacks, sense of great danger, suicidal thoughts/plans, thoughts of violence, unusual visions or sounds, and thoughts /plans of harming others. Endo:  Denies cold intolerance, excessive hunger, excessive thirst, excessive urination, heat intolerance, polyuria, and weight change. Heme:  Denies abnormal bruising, bleeding, enlarge lymph nodes, fevers, pallor, and skin discoloration. Allergy:  Denies hives or rash, itching eyes, persistent infections, seasonal allergies, and sneezing.  Physical Exam  General:  Well-developed,well-nourished,in no acute distress; alert,appropriate and cooperative  throughout examination Head:  Normocephalic and atraumatic without obvious abnormalities. No apparent alopecia or balding. Eyes:  pupils equal, pupils round, pupils reactive to light, and no injection.   Ears:  External ear  exam shows no significant lesions or deformities.  Otoscopic examination reveals clear canals, tympanic membranes are intact bilaterally without bulging, retraction, inflammation or discharge. Hearing is grossly normal bilaterally. Nose:  External nasal examination shows no deformity or inflammation. Nasal mucosa are pink and moist without lesions or exudates. Mouth:  Oral mucosa and oropharynx without lesions or exudates.  Teeth in good repair. Neck:  No deformities, masses, or tenderness noted.no carotid bruits.   Chest Wall:  No deformities, masses, tenderness or gynecomastia noted. Lungs:  Normal respiratory effort, chest expands symmetrically. Lungs are clear to auscultation, no crackles or wheezes. Heart:  normal rate and no murmur.   Abdomen:  Bowel sounds positive,abdomen soft and non-tender without masses, organomegaly or hernias noted. Rectal:  No external abnormalities noted. Normal sphincter tone. No rectal masses or tenderness. Genitalia:  Testes bilaterally descended without nodularity, tenderness or masses. No scrotal masses or lesions. No penis lesions or urethral discharge. Prostate:  no nodules, no asymmetry, no induration, and 1+ enlarged.   Msk:  No deformity or scoliosis noted of thoracic or lumbar spine.   Pulses:  R and L carotid,radial,femoral,dorsalis pedis and posterior tibial pulses are full and equal bilaterally Extremities:  No clubbing, cyanosis, edema, or deformity noted with normal full range of motion of all joints.   Neurologic:  No cranial nerve deficits noted. Station and gait are normal. Plantar reflexes are down-going bilaterally. DTRs are symmetrical throughout. Sensory, motor and coordinative functions appear intact. Skin:  Intact without suspicious lesions or rashes Cervical Nodes:  No lymphadenopathy noted Psych:  Cognition and judgment appear intact. Alert and cooperative with normal attention span and concentration. No apparent delusions, illusions,  hallucinations   Impression & Recommendations:  Problem # 1:  [place] (ICD-V70.0)  Orders: Venipuncture (91638) TLB-Lipid Panel (80061-LIPID) TLB-BMP (Basic Metabolic Panel-BMET) (46659-DJTTSVX) TLB-CBC Platelet - w/Differential (85025-CBCD) TLB-Hepatic/Liver Function Pnl (80076-HEPATIC) TLB-TSH (Thyroid Stimulating Hormone) (84443-TSH) TLB-PSA (Prostate Specific Antigen) (84153-PSA) Specimen Handling (99000) UA Dipstick W/ Micro (manual) (81000)  Reviewed preventive care protocols, scheduled due services, and updated immunizations.  Problem # 2:  HAND PAIN, RIGHT (ICD-729.5)  Orders: Specimen Handling (99000) UA Dipstick W/ Micro (manual) (81000) Orthopedic Surgeon Referral (Ortho Surgeon)  Problem # 3:  CEREBROVASCULAR DISEASE (ICD-437.9)  Orders: Venipuncture (79390) TLB-Lipid Panel (80061-LIPID) TLB-BMP (Basic Metabolic Panel-BMET) (30092-ZRAQTMA) TLB-CBC Platelet - w/Differential (85025-CBCD) TLB-Hepatic/Liver Function Pnl (80076-HEPATIC) TLB-TSH (Thyroid Stimulating Hormone) (84443-TSH) TLB-PSA (Prostate Specific Antigen) (84153-PSA) Specimen Handling (99000) Specimen Handling (99000) UA Dipstick W/ Micro (manual) (81000)  Problem # 4:  CAD (ICD-414.00)  His updated medication list for this problem includes:    Aspirin Ec 81 Mg Tbec (Aspirin) .Marland Kitchen... Take 1 tablet by mouth once a day  Orders: Venipuncture (26333) TLB-Lipid Panel (80061-LIPID) TLB-BMP (Basic Metabolic Panel-BMET) (54562-BWLSLHT) TLB-CBC Platelet - w/Differential (85025-CBCD) TLB-Hepatic/Liver Function Pnl (80076-HEPATIC) TLB-TSH (Thyroid Stimulating Hormone) (84443-TSH) TLB-PSA (Prostate Specific Antigen) (84153-PSA) Specimen Handling (99000)  Labs Reviewed: Chol: 242 (12/28/2009)   HDL: 43.90 (12/28/2009)   LDL: DEL (06/23/2008)   TG: 172.0 (12/28/2009)  Problem # 5:  HYPERLIPIDEMIA, FAMILIAL (ICD-272.0)  His updated medication list for this problem includes:    Lovaza  1 Gm Caps (Omega-3-acid ethyl esters) .Marland Kitchen... 2 by mouth two times a day  Orders: Venipuncture (34287) TLB-Lipid Panel (80061-LIPID) TLB-BMP (Basic Metabolic Panel-BMET) (  80048-METABOL) TLB-CBC Platelet - w/Differential (85025-CBCD) TLB-Hepatic/Liver Function Pnl (80076-HEPATIC) TLB-TSH (Thyroid Stimulating Hormone) (84443-TSH) TLB-PSA (Prostate Specific Antigen) (84153-PSA) Specimen Handling (99000)  Labs Reviewed: SGOT: 53 (12/28/2009)   SGPT: 127 (12/28/2009)   HDL:43.90 (12/28/2009), 44.50 (03/30/2009)  LDL:DEL (06/23/2008), 101 (03/17/2008)  Chol:242 (12/28/2009), 226 (03/30/2009)  Trig:172.0 (12/28/2009), 107.0 (03/30/2009)  Complete Medication List: 1)  Centrum Silver Tabs (Multiple vitamins-minerals) .Marland Kitchen.. 1 by mouth once daily 2)  Aspirin Ec 81 Mg Tbec (Aspirin) .... Take 1 tablet by mouth once a day 3)  Lovaza 1 Gm Caps (Omega-3-acid ethyl esters) .... 2 by mouth two times a day 4)  Saw Palmetto  .Marland Kitchen.. 1 tab by mouth qd 5)  Enablex 7.5 Mg Xr24h-tab (Darifenacin hydrobromide) .Marland Kitchen.. 1 by mouth once daily  Patient Instructions: 1)  rto 6 months Prescriptions: ENABLEX 7.5 MG XR24H-TAB (DARIFENACIN HYDROBROMIDE) 1 by mouth once daily  #30 x 11   Entered and Authorized by:   Garnet Koyanagi DO   Signed by:   Garnet Koyanagi DO on 06/05/2010   Method used:   Print then Give to Patient   RxID:   [id number] LOVAZA 1 GM CAPS (OMEGA-3-ACID ETHYL ESTERS) 2 by mouth two times a day  #120 x 5   Entered and Authorized by:   Garnet Koyanagi DO   Signed by:   Garnet Koyanagi DO on 06/05/2010   Method used:   Electronically to        [facility] [phone]* (retail)       [address]       [place], [place]  [address]       Ph: [phone]       Fax: [phone]   RxID:   [id number]    Orders Added: 1)  Venipuncture [58850] 2)  TLB-Lipid Panel [80061-LIPID] 3)  TLB-BMP (Basic Metabolic Panel-BMET) [27741-OINOMVE] 4)  TLB-CBC Platelet - w/Differential  [85025-CBCD] 5)  TLB-Hepatic/Liver Function Pnl [80076-HEPATIC] 6)  TLB-TSH (Thyroid Stimulating Hormone) [84443-TSH] 7)  TLB-PSA (Prostate Specific Antigen) [72094-BSJ] 8)  Specimen Handling [99000] 9)  Specimen Handling [99000] 10)  UA Dipstick W/ Micro (manual) [81000] 11)  Orthopedic Surgeon Referral [Ortho Surgeon] 12)  Est. Patient 40-64 years [62836]   Immunization History:  Pneumovax Immunization History:    Pneumovax:  historical (12/03/2007)   Immunization History:  Pneumovax Immunization History:    Pneumovax:  Historical (12/03/2007)      Laboratory Results   Urine Tests   Date/Time Reported: June 05, 2010 10:50 AM   Routine Urinalysis   Color: yellow Appearance: Clear Glucose: negative   (Normal Range: Negative) Bilirubin: negative   (Normal Range: Negative) Ketone: negative   (Normal Range: Negative) Spec. Gravity: <1.005   (Normal Range: 1.003-1.035) Blood: negative   (Normal Range: Negative) pH: 6.0   (Normal Range: 5.0-8.0) Protein: negative   (Normal Range: Negative) Urobilinogen: negative   (Normal Range: 0-1) Nitrite: negative   (Normal Range: Negative) Leukocyte Esterace: negative   (Normal Range: Negative)    Comments: Heath Lark  June 05, 2010 10:50 AM

## 2010-07-06 NOTE — Consult Note (Signed)
Summary: [facility]   Imported By: Phillis Knack 06/27/2010 15:27:12  _____________________________________________________________________  External Attachment:    Type:   Image     Comment:   External Document

## 2010-07-10 ENCOUNTER — Encounter: Payer: Self-pay | Admitting: Family Medicine

## 2010-08-08 NOTE — Letter (Signed)
Summary: [facility]   Imported By: Laural Benes 07/28/2010 14:30:39  _____________________________________________________________________  External Attachment:    Type:   Image     Comment:   External Document

## 2010-10-03 NOTE — Assessment & Plan Note (Signed)
OFFICE VISIT   [name], [name]  DOB:  January 10, 1951                                        June 23, 2007  CHART #:  [id number]   HISTORY OF PRESENT ILLNESS:  The patient returns for further followup  with tentative plans to proceed with elective mitral valve repair  tomorrow.  He was last seen here in the office on May 26, 2007.  Since then, he has done fine with no other issues or complaints.  We  spent in excess of 20 minutes reviewing our operative plan in the office  today.  The patient and his wife understand and accept all potential  associated risks of surgery, including but not limited to risk of death,  stroke, myocardial infarction, congestive heart failure, respiratory  failure, pneumonia, bleeding or clotting, blood transfusion, arrhythmia,  infection, late complications related to valve repair.  All of the  questions have been addressed.   Valentina Gu. Roxy Manns, M.D.  Electronically Signed   CHO/MEDQ  D:  06/23/2007  T:  06/23/2007  Job:  [id number]   cc:   Denice Bors. Stanford Breed, MD, [place], DO

## 2010-10-03 NOTE — Assessment & Plan Note (Signed)
[facility]   NAME:  [name], [name] NO.:  [id number]   MEDICAL RECORD NO.:  [id number]      DATE OF BIRTH:  Sep 14, 1950   PHYSICIAN:  Wallis Bamberg. Johnsie Cancel, MD, [facility]    VISIT DATE:                                   OFFICE VISIT   Transesophageal echocardiogram.   INDICATIONS:  A 56-year patient status post mitral valve repair with  fevers, rule out SBE. The patient was sedated with 100 mcg of fentanyl  and 8 mg of Versed.   Using digital technique an OmniPlane probe was advanced into distal  esophagus without incident.   LV in place. There was trivial opposing volume MR.  There was no  significant stenosis by color flow. The mitral valve repair appeared  normal and there was some mild thickening of the subchordal apparatus.  There was mild left atrial enlargement.  The atrial septum was intact  with no ASD by color flow. Right-sided cardiac chambers were normal.  Tricuspid pulmonary valves well-visualized no evidence of SVE.  Aortic  valve was trileaflet with trivial aortic insufficiency. Aortic root was  normal.  The patient may have had the left atrial appendage oversewn. It  was extremely small and hard to visualize. I could not see any color-  flow by Doppler. The aorta showed no significant debris.   FINAL IMPRESSION:  1. No evidence of SVE.  2. Normal functioning, mitral valve repair with trivial mitral      insufficiency.  3. Trivial aortic insufficiency.  4. Normal tricuspid and pulmonary valves.  5. EF 50-55% range with no regional wall motion abnormalities.  6. No aortic debris.  7. Question left atrial appendage oversewn.  Small not well visualized      without significant color-flow. The patient tolerated procedure      well.      Wallis Bamberg. Johnsie Cancel, MD, [facility]  Electronically Signed     PCN/MEDQ  D:  08/11/2007  T:  08/11/2007  Job:  [id number]

## 2010-10-03 NOTE — Discharge Summary (Signed)
NAMEDOCTOR, SHEAHAN NO.:  [id number]   MEDICAL RECORD NO.:  [id number]          PATIENT TYPE:  INP   LOCATION:  2020                         FACILITY:  [facility]   PHYSICIAN:  Eban Giovanni, P.A.-C. DATE OF BIRTH:  1950-06-30   DATE OF ADMISSION:  06/24/2007  DATE OF DISCHARGE:  06/29/2007                               DISCHARGE SUMMARY   ADDENDUM  The patient was originally scheduled for discharge on or about June 28, 2007.  He continued to progress nicely.  He did require some  continued diuresis.  Notably, he was having some shortness of breath and  weakness on June 28, 2007.  He progressed, however, nicely and was  deemed to be acceptable for discharge on June 29, 2007.   LABORATORY VALUES:  On that date showed electrolytes, BUN, and  creatinine to be within normal limits.  PT/INR was 17.5 and 1.4 and beta  natriuretic peptide was 384.  It was determined the patient would  require additional Lasix for 7 days, 40 mg daily.  There were no other  changes in the previously dictated summary. For further details of this  hospitalization, please see the original dictation.      Sabin Giovanni, P.A.-C.     Loren Racer  D:  09/09/2007  T:  09/10/2007  Job:  [id number]   cc:   Valentina Gu. Roxy Manns, M.D.  Denice Bors Stanford Breed, MD, [facility]

## 2010-10-03 NOTE — Assessment & Plan Note (Signed)
OFFICE VISIT   DELDRICK, LINCH  DOB:  06-24-1950                                        May 26, 2007  CHART #:  [id number]   HISTORY OF PRESENT ILLNESS:  [name] returns for further followup  and preoperative counseling with respect to mitral valve prolapse with  severe mitral regurgitation. He was originally seen in consultation on  April 21, 2007. Since then, Mr. Hirano reports no interval  development of any significant medical problems or complications. He  underwent cardiac catheterization in December by Dr. Pierre Bali. He  was found to have 30-40% ostial stenosis of the left main coronary  artery with otherwise no significant coronary artery disease whatsoever.  Right heart pressures were normal. There is normal left ventricular  function. There is severe mitral regurgitation.   I spent in excess of 30 minutes again discussing the indications, risks,  and potential benefits of surgery with Mr. Lorenson and his wife. The  rationale for proceeding with surgery at this juncture versus continued  close followup has been discussed in detail. Alternative surgical  techniques have been reviewed as well as minimally invasive techniques  versus conventional sternotomy. All of his questions have been  addressed. Mr. Cassin tentatively wants to schedule surgery the first  week of February. We will plan for him to return to see [place] for followup  on Monday, February 2 with plans for elective mitral valve repair on  Tuesday, February 3.   Valentina Gu. Roxy Manns, M.D.  Electronically Signed   CHO/MEDQ  D:  05/26/2007  T:  05/26/2007  Job:  [id number]   cc:   Denice Bors. Stanford Breed, MD, [place], DO

## 2010-10-03 NOTE — Op Note (Signed)
[name], PRINTUP NO.:  [id number]   MEDICAL RECORD NO.:  [id number]          PATIENT TYPE:  INP   LOCATION:  2313                         FACILITY:  [facility]   PHYSICIAN:  Valentina Gu. Roxy Manns, M.D. DATE OF BIRTH:  1950-06-15   DATE OF PROCEDURE:  06/24/2007  DATE OF DISCHARGE:                               OPERATIVE REPORT   PREOPERATIVE DIAGNOSIS:  Mitral valve prolapse with severe mitral  regurgitation.   POSTOPERATIVE DIAGNOSIS:  Mitral valve prolapse with severe mitral  regurgitation.   PROCEDURE:  Median sternotomy for mitral valve repair (quadrangular  resection of the posterior leaflet with sliding reduction posterior  leaflet annuloplasty and 36 mm Memo 3-D ring annuloplasty).   SURGEON:  Dr. Rexene Alberts   ASSISTANT:  Ms. Doroteo Bradford   ANESTHESIA:  General.   BRIEF CLINICAL NOTE:  The patient is a 60 year old male first noted to  have a heart murmur on routine physical exam 2003.  He apparently had an  echocardiogram done at that time demonstrating mild mitral  regurgitation.  The patient has more recently developed symptoms of  exertional shortness of breath.  An echocardiogram performed in the fall  of 2008 was notable for mitral valve prolapse with severe mitral  regurgitation and possible flail segment of the posterior leaflet of the  mitral valve.  The patient underwent transesophageal echocardiogram that  confirmed the presence of prolapse involving portion of the posterior  leaf of mitral valve and severe (4+) mitral regurgitation.  Left and  right heart catheterization has been performed on May 08, 2007.  This demonstrates no significant coronary artery disease with preserved  left ventricular function.  A full consultation has been dictated  previously.  The patient has been counseled at length regarding the  indications, risks, and potential benefits of surgery.  Alternative  treatment strategies have been discussed.  He  desires to proceed as  described.   OPERATIVE FINDINGS:  1. Bileaflet prolapse of the mitral valve with large billowing and      redundant leaflets, consistent with Barlow's syndrome.  2. Severe prolapse involving the posterior leaflet of the mitral valve  3. Moderate left ventricular chamber dilatation.  4. Normal left ventricular systolic function.  5. Trivial residual mitral regurgitation following successful mitral      valve repair.   OPERATIVE NOTE IN DETAIL:  The patient is brought to the operating room  on the above-mentioned date and central monitoring was established by  the anesthesia service under the care and direction of Dr. Lorrene Reid.  Specifically, a Swan-Ganz catheter is placed through the right internal  jugular approach.  A radial arterial line was placed.  Intravenous  antibiotics were administered.  Following induction with general  endotracheal anesthesia, Foley catheter is placed.  The patient's chest,  abdomen, both groins, and both lower extremities were prepared, draped  in sterile manner.   Baseline transesophageal echocardiogram was performed by Dr. Al Corpus.  This confirms the presence of severe mitral regurgitation with bileaflet  prolapse of the mitral valve and particularly severe prolapse involving  the posterior leaflet.  There  is a broad jet of mitral regurgitation  which fills the left atrium.  The valve is large and billowing.  There  did not appear to be any flail segments or ruptured chordae tendineae.  The left ventricular chamber is somewhat dilated.  Left ventricular  systolic function appears normal.  The aortic valve appears normal.   A median sternotomy incision was performed.  The pericardium was opened.  The patient is heparinized systemically.  The ascending aorta is normal  in appearance.  The ascending aorta was cannulated for cardiopulmonary  bypass.  A venous cannula is placed directly in the superior vena cava.  The second venous  cannula is placed low in the right atrium with tip  extending down the inferior vena cava.  A retrograde cardioplegic  catheter is placed through the right atrium into the coronary sinus.  Adequate heparinization is verified.  Cardiopulmonary bypass is begun.  A temperature probe is placed left ventricular septum.  A cardioplegic  catheter is placed in the ascending aorta.   The patient is cooled to 32 degrees systemic temperature.  The aortic  crossclamp was applied and cold blood cardioplegia is administered  initially in antegrade fashion through the aortic root.  Iced saline  slush is applied for topical hypothermia.  Supplemental cardioplegia is  administered retrograde through the coronary sinus catheter.  The  initial cardioplegic arrest and myocardial cooling are felt to be  excellent.  Repeat doses of cardioplegia are administered intermittently  throughout the crossclamp portion of the operation retrograde through  the coronary sinus catheter to maintain left ventricular septal  temperature below 15 degrees centigrade.   A left atriotomy incision was performed posteriorly through the intra-  atrial groove.  The mitral valve was exposed using a self-retaining  retractor.  Exposure is felt to be excellent.  The mitral valve was  carefully analyzed.  The valve was very large with large redundant  leaflets and severe prolapse involving the posterior leaflet and mild  prolapse involving the anterior leaflet of the mitral valve.  There are  no ruptured chordae tendineae nor are there any flail segments of the  leaflets.  The posterior leaflet is much taller than normal and in fact  is equally as tall as the anterior leaflet in its midportion.  There is  a large amount of redundant leaflet tissue involving the posterior  leaflet with large billowing scallops involving P1 and P2.  The leaflets  are mild to moderately thickened.  There is no calcification.  The gross  findings all  consistent with Barlow syndrome.   Interrupted 2-0 Ethibond horizontal mattress sutures were placed around  the mitral valve annulus, ultimately to be utilized for ring  annuloplasty.  At this juncture they are placed in the retractor to  facilitate symmetrical exposure of the valve.  A generous quadrangular  resection of the severely prolapsing portion of the posterior leaflet of  the mitral valve was performed.  This quadrangular resection encompasses  the portion of P1 and a portion of P2.  A sliding reduction leaflet  annuloplasty is then performed.  The remaining portions of P1 towards  the anterior commissure and the remaining portion of P2 and P3 towards  the posterior commissure are mobilized off the posterior mitral annulus.  The height of the posterior leaflet is then trimmed by resecting a wedge-  shaped portions of the posterior leaflet along the posterior margin of  the freely mobile leaflet.  The posterior annulus circumference is  shortened  by placement of a total of four interrupted figure-of-eight 2-  0 Ethibond compression sutures.  The remainder of the 2-0 Ethibond  horizontal mattress ring annuloplasty sutures were now placed throughout  the posterior annulus.  The posterior leaflet of the mitral valve was  now reattached to the posterior annulus using a two-layer closure of  running 4-0 Ethibond suture.  The intervening vertical defect between  the two halves of the posterior leaflet was then closed using  interrupted simple everting 4-0 Ethibond sutures.  At this juncture the  valve is tested for competence and there appears to be broad symmetrical  surface area of coaptation with perhaps mild residual prolapse involving  the anterior leaflet but no sign of any residual mitral regurgitation.  The mitral valve was sized to accept a 36-mm annuloplasty ring.  This  size corresponds to slightly larger than the total dimensions of the  anterior leaflet.  A 36-mm  [employer] Memo 3-D annuloplasty ring  (catalog number [id number], serial number [id number]) was secured in place  uneventfully.  After completion of the ring annuloplasty the valve was  again inspected for competence.  There is no residual leak.  There is  mild residual prolapse involving the anterior leaflet in the vicinity of  the junction between the A1 and A2.  This corrected by placement of a  pair of 4-0 Ethibond everting sutures along the free margin of the  anterior leaflet to shorten the anterior leaflet free margin length and  evert the anterior commissure.  After completion of this added  correction to the anterior leaflet, there remains a very nice,  symmetrical line of coaptation of the anterior and posterior leaflets  with broad surface area of coaptation and no sign of any residual mitral  regurgitation.   The patient is rewarmed to 37 degrees centigrade temperature.  The left  atrial appendage was oversewn from within the left atrium using a two-  layer closure of running 3-0 Prolene suture.  The left atriotomy  incision was closed using a two-layer closure of running 3-0 Prolene  suture.  The patient is placed in Trendelenburg position.  One final  dose of warm retrograde hot shot cardioplegia is administered.  The  lungs were ventilated and the heart allowed to fill to evacuate any  residual air through the aortic root.  The aortic crossclamp was removed  after total crossclamp time of 148 minutes.   The heart began to beat spontaneously without need for cardioversion.  Epicardial pacing wires fixed to the right ventricular free and to the  right atrial appendage.  The patient is rewarmed to 37 degrees  centigrade temperature.  The IVC cannula is removed.  The patient is  weaned from cardiopulmonary bypass without difficulty.  The patient's  rhythm at separation from bypass is normal sinus rhythm.  Atrial pacing  is employed to increase heart rate.  No inotropic support is  required.  Total cardiopulmonary bypass time the operation is 165 minutes.  Follow-  up transesophageal echocardiogram performed by Dr. Oren Bracket  after separation from bypass demonstrates a well seated annuloplasty  ring with a normal appearing mitral valve following repair with no  residual mitral prolapse and trivial residual mitral regurgitation.  There is no residual air.  Left ventricular function remains preserved.  No other abnormalities are noted.   The aortic root vent was removed.  The SVC cannula is removed.  The  aortic cannula is removed uneventfully.  Protamine is administered to  reverse  anticoagulation.  The mediastinum was irrigated with saline  solution containing vancomycin.  Meticulous surgical hemostasis is  ascertained.  The mediastinum and right pleural space are drained using  three chest tubes exited through separate stab incisions inferiorly.  The pericardium and soft tissues anterior to the aorta are  reapproximated loosely.  The sternum was closed with double-strength  sternal wire.  The On-Q continuous pain management system is utilized to  facilitate postoperative pain control.  Two 10-inch catheters supplied  with the On-Q kit are tunneled into the deep subcutaneous tissues and  positioned just lateral to the lateral border of the sternum on either  side.  Each catheter is flushed with 5 mL of 0.5% bupivacaine solution  and ultimately connected to continuous infusion pump.  The soft tissues  anterior to the sternum are closed in multiple layers and the skin is  closed with a running subcuticular skin closure.   The patient tolerated the procedure well and was transported to the  surgical intensive care unit in stable condition.  There are no  intraoperative complications.  All sponge, instrument and needle counts  verified correct at completion of the operation.  No blood products were  administered.      Valentina Gu. Roxy Manns, M.D.   Electronically Signed     CHO/MEDQ  D:  06/24/2007  T:  06/25/2007  Job:  [id number]   cc:   Denice Bors. Stanford Breed, MD, [place], DO

## 2010-10-03 NOTE — Assessment & Plan Note (Signed)
OFFICE VISIT   [name], [name]  DOB:  10-16-1950                                        July 25, 2007  CHART #:  [id number]   DATE OF VISIT:  July 25, 2007.   HISTORY OF PRESENT ILLNESS:  [name] presents for his postop  followup office visit following mitral valve repair (quadrangular  resection of the posterior leaflet with a sliding reduction posterior  leaflet annuloplasty and a 36 mm Memo 3-D ring annuloplasty).  This was  done by Dr. Roxy Manns on June 24, 2007.  He was discharged on June 29, 2007.  He reports that he is making a steady, stable progress.  He  noted a couple of days ago that he did have a rattling-type, tactile  sensation in the region of his right costophrenic angle.  A chest x-ray  was done at that time, July 23, 2007, which reveals a small pleural  effusion.  He is otherwise asymptomatic.  He is walking more than one  mile daily.  He denies significant shortness of breath, dyspnea on  exertion, or orthopnea.  He denies significant chest incisional pain.  He denies difficulty with significant peripheral edema.  His chest x-ray  is reviewed from July 23, 2007, and there is a small, right, pleural  effusion; otherwise, it is unremarkable.  There is no evidence of  congestive heart failure.   PHYSICAL EXAMINATION:  Vital signs:  Blood pressure is 118/74, heart  rate is 89, respirations 18, oxygen saturation 97% on room air.  General:  A well-appearing, middle-aged male in no acute distress.  Lungs:  Clear breath sounds without rales, rhonchi, or wheeze.  Cardiac  examination:  Regular rate and rhythm without murmurs, gallops, or rubs,  normal S1 S2.  Extremities:  No edema.  Incision well-healing without  evidence of infection.   ASSESSMENT:  Small, right, pleural effusion following mitral valve  repair surgery.  The patient was started two days ago on Lasix and  potassium with a prescription for 40 mg of Lasix for two  weeks,  potassium 20 mEq for two weeks.  This appears to be reasonable.  He will  continue this therapy.  He is instructed on increasing his levels of  activities, decreasing the lifting parameters, and resumption of  driving.  He will continue to follow up with the cardiologists.  We will  see him back at the surgery office on a p.r.n. basis.   ADDENDUM:  [name] was evaluated by Dr. Prescott Gum during this  visit.   [name], P.A.-C.   Loren Racer  D:  07/25/2007  T:  07/25/2007  Job:  [id number]   cc:   Valentina Gu. Roxy Manns, M.D.  Denice Bors Stanford Breed, MD, [place], DO

## 2010-10-03 NOTE — Assessment & Plan Note (Signed)
[facility]                            CARDIOLOGY OFFICE NOTE   CARLIN, ATTRIDGE                      MRN:          [id number]  DATE:04/14/2007                            DOB:          July 10, 1950    HISTORY OF PRESENT ILLNESS:  Mr. Wight is a pleasant gentleman that I  have recently evaluated for dyspnea on exertion as well as mitral  regurgitation.  The patient has had some dyspnea on exertion for the  past one month.  There is no chest pain, palpitations or syncope.  He  has not had pedal edema.  He previously did describe occasional heart  pounding when he laid down.  It was noted, when I initially saw him,  that he had had a previous echocardiogram performed on March 10, 2007.  At that time, his left ventricular end systolic dimension was 51.7 mm  and his ejection fraction was noted to be 60%.  There was probable  partial flail of the posterior leaflet with severe mitral regurgitation.  The left atrium was moderately dilated.  We therefore scheduled the  patient to have a transesophageal echocardiogram which I performed on  April 10, 2007.  The patient's LV function was normal.  There was  marked prolapse of the posterior mitral valve leaflet, and the leaflets  did not collapse.  There was severe eccentric mitral regurgitation.  He  now returns for further discussion concerning this issue.  Note, we also  performed carotid Dopplers on April 04, 2007, secondary to a bruit  that was normal.  Also note, when we saw him previously, we did add  Lisinopril 5 mg p.o. daily to his medical regimen.  We did follow up  laboratories on April 04, 2007. His BUN and creatinine were normal.  His potassium was 4.2.  His liver functions were normal and mildly  elevated bilirubin.  His cholesterol was 244 with an LDL of 180.  Since  I last saw him, he states his dyspnea has improved.  He feels that the  medication is helping with this.  He has had  no chest pain, palpitations  or syncope.  There have been no fevers, abdominal pain or nausea or  vomiting.  There is no headache.  He has had some anxiety related to his  cardiac problem, but this has improved since I saw him.  There  apparently has been some sexual difficulties related to his Lisinopril  by his report.   PRESENT MEDICATIONS:  1. Multivitamin daily.  2. Glucosamine.  3. Fish oil.  4. Garlic.  5. Saw Palmetto.  6. Lisinopril 5 mg p.o. daily.   PHYSICAL EXAMINATION:  VITAL SIGNS:  Blood pressure 136/80, pulse 70.  HEENT:  Normal.  NECK:  Supple with no bruits.  CHEST:  Clear.  CARDIOVASCULAR:  Regular rate and rhythm.  There is a 3/6 systolic  murmur at the apex that radiates to the left axilla.  ABDOMEN:  No tenderness.  EXTREMITIES:  No edema.   DIAGNOSES:  1. Severe mitral regurgitation - the patient's transesophageal      echocardiogram shows  severe prolapse of the posterior leaflet with      severe mitral regurgitation.  Note his LV function is normal and      his left ventricular and systolic dimension is less than 40.  I      have explained that there are two options in treating this      particular problem.  The first would be early repair.  This would      potentially void the risks of LV dysfunction as well as atrial      arrhythmias.  I have recommended this approach particularly in      light of his recent dyspnea on exertion.  I have also explained      that it would be perfectly acceptable to do followup      echocardiograms and proceed if he has worsening symptoms or if he      has left ventricular enlargement with an end systolic dimension      greater than 40 mm or he develops reduced LV function.  I have      explained that there is a risk of developing into arrhythmias, and      LV dysfunction in the future if we follow this approach.  He will      consider these options, but most likely would like to proceed with      early repair.  I  therefore arranged for him to see Dr. Darylene Price of [facility] for further discussions about this.  If he does decide      to proceed with early repair, then we will schedule him to have a      right and left cardiac catheterization prior to the procedure as an      outpatient.  For now, we will continue with his Lisinopril for      afterload reduction and blood pressure control.  2. Hypertension - his blood pressure appears to be adequately      controlled at present.  He is complaining of some sexual      dysfunction due to his ACE inhibitor.  If this becomes an issue in      the future, then we will discontinue this medication and control      with others potentially Norvasc.  3. Hyperlipidemia - the patient's LDL was severely elevated.  I have      recommended Zocor 40 mg p.o. daily.  He is somewhat hesitant as his      father died of liver cancer, and he attributes this to a previous      statin.  However, he has agreed to begin Zocor, and we will check      lipids and liver in six weeks.  I have explained the potential      complications related to liver dysfunction as well as myalgias.  4. History of right carotid bruit - his Carotid Dopplers are normal.  5. I have asked him to begin enteric coated aspirin 81 mg p.o. daily      prior to his catheterization.  We will arrange for him to be seen      back in 8 weeks, as he would most likely like to have his surgery      performed in late January versus early February.  In the meantime,      he will see Dr. Roxy Manns.     Denice Bors Stanford Breed, MD, [facility]  Electronically Signed    BSC/MedQ  DD: 04/14/2007  DT: 04/14/2007  Job #: [id number]   cc:   Valentina Gu. Roxy Manns, M.D.

## 2010-10-03 NOTE — Assessment & Plan Note (Signed)
[facility] OFFICE NOTE   DOZIER, BERKOVICH                      MRN:          [id number]  DATE:08/02/2008                            DOB:          1950-07-20    [name] is a pleasant gentleman who has a history of mitral valve  repair secondary to severe mitral regurgitation in February 2009.  He  has also had atrial flutter ablation in March 2009.  Postoperatively, he  had recurrent fevers that were felt secondary to a postsurgical  inflammatory state.  These have now resolved.  Since I last saw him,  there is no dyspnea, chest pain, palpitations, or syncope.  There is no  pedal edema.  He has not had any recurrent fevers.   MEDICATIONS:  1. Multivitamin.  2. Aspirin 81 mg p.o. daily.  3. Fish oil.  4. Glucosamine.  5. Saw palmetto.   PHYSICAL EXAMINATION:  VITAL SIGNS:  Shows a blood pressure of 114/76  and his pulse is 85.  HEENT:  Normal.  NECK:  Supple.  CHEST:  Clear.  CARDIOVASCULAR:  Regular rate and rhythm.  There is no murmur noted.  ABDOMEN:  Shows no tenderness.  EXTREMITIES:  Show no edema.   His electrocardiogram shows a sinus rhythm with a first-degree AV block.  There are no ST changes noted.   DIAGNOSES:  1. Status post mitral valve repair - [name] is doing well from a      symptomatic standpoint.  He will continue with SBE prophylaxis.  We      will most likely repeat his echocardiogram in 1 year when I see him      back.  2. History of recurrent fevers - this was felt secondary to a      postinflammatory reaction, and they have now resolved.  3. History of atrial flutter ablation.  4. History of cerebrovascular disease - he needs followup carotid      Dopplers.  He will continue on his aspirin.  5. Coronary artery disease - he will continue on his aspirin.  6. Hypertension - his blood pressure is controlled on no medications.  7. Hyperlipidemia - the patient has had  increased liver functions in      the past with statins.  He has been seen in the [facility].      Lipitor is to be initiated.  We will check lipids and liver based      on [facility]'s recommendations.  Note, he has also had some      ecchymoses.  I      will check his CBC at that time as well.  If the above is normal,      we will see him back in 1 year.     Denice Bors Stanford Breed, MD, [facility]  Electronically Signed    BSC/MedQ  DD: 08/02/2008  DT: 08/03/2008  Job #: [id number]

## 2010-10-03 NOTE — Assessment & Plan Note (Signed)
[place] OFFICE NOTE   [name], [name]                      MRN:          [id number]  DATE:01/23/2008                            DOB:          [birth year]    [name] is a pleasant gentleman who has a history of mitral valve  repair secondary to severe mitral regurgitation in February 2009.  He  also had an atrial flutter ablation in March.  He has had problems with  recurrent fevers.  His workup has been negative for infection.  We had  felt that this is most likely secondary to postsurgical inflammatory  reaction.  He has been followed by ID and also Gastroenterology for  increased liver functions.  Since I last saw him, he is doing well.  He  denies any dyspnea, chest pain, palpitations, or syncope and there is no  pedal edema.  He has not had recurrent fevers.  There are no chills.   His medications include:  1. Colchicine 0.6 mg p.o. daily.  2. Aspirin 81 mg p.o. daily.  3. Lasix 20 mg p.o. daily.  4. Multivitamin.   Physical exam shows a blood pressure of 115/80 and his pulse is 72.  He  weighs 204 pounds.  His HEENT is normal.  His neck is supple.  His chest  is clear.  His cardiovascular exam reveals regular rate and rhythm.  His  abdominal exam shows no tenderness.  His extremities show no edema.   DIAGNOSES:  1. Status post mitral valve repair - [name] is doing well from a      symptomatic standpoint.  He will continue with subacute bacterial      endocarditis prophylaxis.  2. History of recurrent fevers - We have felt that this is most likely      a postinflammatory reaction from his surgery.  He will continue on      his colchicine at 0.6 mg p.o. daily.  He has also been followed by      Infectious Disease.  He is scheduled to see them in November.  I      think we could most likely stop his colchicine at that time and see      how he does.  Note we will check a CBC given his  colchicine use.  3. History of atrial flutter ablation - He remains in sinus rhythm on      examination today.  4. History of left pleural effusion - This is not apparent on      examination today.  We will change his Lasix to as needed.  5. History of cerebrovascular disease - He will continue on his      aspirin.  We will consider adding a statin in the future once it is      clear that his liver functions have normalized and his      postinflammatory reaction has resolved.  6. Coronary artery disease - He will also continue on his aspirin, and      as above, we will consider statin in the future.  7. Hypertension - His blood pressure is adequately controlled.  We      will check a BMET.  8. Hyperlipidemia - We will check lipids and liver.   He will see [place] back in 6 months.     Denice Bors Stanford Breed, MD, [facility]  Electronically Signed    BSC/MedQ  DD: 01/23/2008  DT: 01/24/2008  Job #: [id number]

## 2010-10-03 NOTE — Assessment & Plan Note (Signed)
[name] OFFICE NOTE   [name], [name]                      MRN:          [id number]  DATE:06/09/2007                            DOB:          [birth year]    HISTORY:  [name] is a pleasant gentleman whom I have been recent  evaluating for severe mitral regurgitation, secondary to a partially  flail posterior mitral valve leaflet.  He did have a cardiac  catheterization performed in anticipation of mitral valve repair.  This  was performed on May 08, 2007.  The patient had severe mitral  regurgitation, and his ejection fraction was 55%.  There was a 40%  ostial left main narrowing that was felt to be an anatomical variant and  not flow-limiting.  Since then the patient does have some dyspnea on  exertion.  There is no orthopnea, PND, pedal edema, palpitations,  presyncope, syncope, or chest pain.  He does state that he feels a  fullness in his chest at times; but is not related to exertion.   MEDICATIONS INCLUDE:  1. A multivitamin.  2. Glucosamine.  3. Fish oil.  4. Garlic.  5. Saw palmetto.  6. Lisinopril 5 mg p.o. daily.  7. Aspirin 81 mg p.o. daily.  8. Zocor 40 mg daily.  9. I-caps vitamin.   PHYSICAL EXAMINATION:  Today shows a blood pressure of 110/80 and his  pulse is 72.  He weighs 198 pounds.  HEENT:  Normal.  NECK:  Supple.  CHEST:  Clear.  CARDIOVASCULAR EXAM:  Regular rate and rhythm.  There is a 3/6 systolic  murmur at the apex that radiates to the axilla.  ABDOMINAL EXAM:  Shows no tenderness.  His right groin shows no hematoma no bruits.  EXTREMITIES:  Show no edema.   DIAGNOSIS:  1. Severe mitral regurgitation.  [name] has met with Dr. Roxy Manns,      and his surgery is scheduled for February 3 for mitral valve      repair.  He did have an anatomic variant on his coronaries, but it      was felt not to be flow limiting; and he, therefore, will not      require  coronary artery bypassing graft.  He will need subacute      bacterial endocarditis prophylaxis following the procedure.  We      will also plan to schedule him to have a baseline echocardiogram      following this procedure.  2. Hypertension.  His blood pressure is adequately controlled on his      present medications.  3. Hyperlipidemia.  Lipitor, better on the Zocor.  I did recommend      increasing to 80 mg p.o. daily.  However, he is extremely concerned      about potential side effects, particularly liver problems as his      father died of liver cancer.  We have elected to continue the Zocor      at 40 mg p.o. daily.  He will also continue his diet      postoperatively,  and we will plan to recheck these,  and then make      another decision about his doses.  4. History of right carotid bruit.  His carotid Dopplers have been      normal.  5  We will see him back after his surgery.     Denice Bors Stanford Breed, MD, [facility]  Electronically Signed    BSC/MedQ  DD: 06/09/2007  DT: 06/09/2007  Job #: [id number]

## 2010-10-03 NOTE — Assessment & Plan Note (Signed)
[place] OFFICE NOTE   RYUN, VELEZ                      MRN:          [id number]  DATE:07/14/2007                            DOB:          [birth year]    [name] is a gentleman that I have seen secondary to severe mitral  regurgitation secondary to a partially flail posterior mitral valve  leaflet.  Previous catheterization revealed 40% ostial left main prior  to mitral valve repair.  He underwent surgery with a mitral valve repair  and quadrangular resection of the poster mitral valve leaflet on  June 24, 2007.  Since discharge, he has done well.  He denies any  dyspnea on exertion, orthopnea, PND, pedal edema, palpitations,  presyncope, syncope.  He has some pain in his chest when he coughs but  otherwise has not.   MEDICATIONS:  1. Multivitamin daily.  2. Fish oil.  3. Aspirin 81 mg daily.  4. Zocor 40 mg daily.  5. ICAPS.  6. Toprol 25 mg daily.  7. Coumadin as directed.   PHYSICAL EXAMINATION:  VITAL SIGNS:  Physical exam today shows a blood  pressure of 123/78.  His pulse is 89.  He weighs 194 pounds.  HEENT:  Normal.  NECK:  Supple.  CHEST:  Clear.  CARDIOVASCULAR:  Regular rate.  There are no murmurs noted.  ABDOMEN:  Exam shows no tenderness.  EXTREMITIES:  Show no edema.   DIAGNOSES:  1. Status post mitral valve repair.  Mr. Comunale is recovering nicely      from his surgery.  He is scheduled to see Dr. Prescott Gum next week.      We will continue with his aspirin, Coumadin and Toprol for now.  We      will most likely be able to discontinue Coumadin when I see him      back in 3 months.  Note:  We discussed importance of SBE      prophylaxis, and I provide a card today.  We will also schedule him      to have a baseline echocardiogram in approximately 3 months.  2. History of mild cerebrovascular disease. Note:  A preoperative      Doppler showed a 40-59% right internal  carotid artery stenosis as      well as 40-49% left stenosis.  He will continue on his aspirin and      statin,  and we will need followup carotid Dopplers in 1 year.  3. Coumadin therapy.  This is being managed in our Coumadin clinic      with goal INR 2-3 at present.  4. Coronary artery disease.  He will continue on aspirin as well as a      statin.  5. Hypertension.  His blood pressure is adequately controlled on his      present medications.  6. Hyperlipidemia.  He will continue on Zocor.  When he returns in 3      months, we will check lipids and liver.  Our goal LDL will be less      and  70 given his history of cerebrovascular disease and moderate      left main disease.   We will see him back in 3 months.     Denice Bors Stanford Breed, MD, [facility]  Electronically Signed    BSC/MedQ  DD: 07/14/2007  DT: 07/14/2007  Job #: [id number]

## 2010-10-03 NOTE — H&P (Signed)
[name], MAN NO.:  [id number]   MEDICAL RECORD NO.:  [id number]          PATIENT TYPE:  INP   LOCATION:  6962                         FACILITY:  [facility]   PHYSICIAN:  [name], MD, FACCDATE OF BIRTH:  [birth year]   DATE OF ADMISSION:  11/23/2007  DATE OF DISCHARGE:                              HISTORY & PHYSICAL   PRIMARY CARE PHYSICIAN:  Peter Chessman, DO   PRIMARY CARDIOLOGIST:  Peter Bors. Stanford Breed, MD, [facility]   EP CARDIOLOGIST:  Peter Sprang, MD, [facility]   CARDIOVASCULAR-THORACIC SURGERY:  Peter Gu. Roxy Manns, MD   INFECTIOUS DISEASE:  Peter Evener, MD   [name] is a 60 year old male who was referred to the emergency room  after he called our answering service and spoke to myself in regards to  reoccurring fevers associated with shortness of breath and dry cough.  [name] states that since his mitral valve repair, he has had  extensive workup for similar symptoms and not has been specifically  proven to attribute to his symptoms.  Last Wednesday, he describes after  eating spaghetti, some anterior chest indigestion, which was gone by  Thursday.  However Thursday, he noticed that he was running a low-grade  temperature of approximately 99.  He states that his temperature usually  runs 96-97 and when his temperature begins to become slightly elevate,  he generally does not feel well.  By last evening, his temperature was  up to 102 throughout.  At this time, he would have some pleuritic chest  discomfort that was particularly in the left lower lateral chest.  He  felt like he was unable to take full breath and the discomfort would  intensify with a deep breath.  He has been practicing on an incentive  spirometry that he had post to his mitral valve replacement, and has not  noticed any significant decrease.  Since last night, he has noticed  increased shortness of breath with talking and with exertion.  He also  has had a dry cough..   This morning his temperature was 102 at 4:00 a.m.  and he took 500 mg Tylenol at 9:00 a.m. it was 99 when he took more  Tylenol.   ALLERGIES:  Include ADHESIVE TAPE.  He has elevated LFTs associated with  simvastatin.   MEDICATIONS:  Prior to admission include Lasix 20 mg daily and aspirin  81 mg daily.   PAST MEDICAL HISTORY:  Notable for severe MR with mitral valve repair in  March, 2009.  He had some postoperative atrial flutter for which he will  underwent ablation.  As previously mentioned, he has had some  postoperative intermittent low-grade temperatures and has had extensive  workup through our practice as well as infectious diseases without any  specific findings.  These have included an elevated CRP, however, he has  had negative blood cultures, negative TEEs and CTs.  He has also been  treated for residual postop pleural effusion.  His history is notable  for hypertension, hyperlipidemia, and coronary artery disease.  Catheterization in February 2009, showed 40% left main.  He  also has  cerebrovascular disease with a 59% right ICA, 49% left ICA, and history  of T and A.   SOCIAL HISTORY:  He resides in [place] with his wife.  He is an  Chief Financial Officer.  He denies any tobacco, alcohol, or drugs.  He exercises  regularly.   FAMILY HISTORY:  Father deceased with liver cancer.  His mother has  history of diabetes, COPD in group.  He does not have any brothers and  sisters.   REVIEW OF SYSTEMS:  In addition to the above is notable for fevers and  chills.  Occasional headache, reading glasses, and dry cough..   PHYSICAL EXAMINATION:  GENERAL:  Well-nourished, well-developed pleasant  white male, in no apparent distress.  VITAL SIGNS:  Temperature 92, blood pressure 115/71, pulse 81, and  respirations 22.  HEENT:  Grossly unremarkable.  NECK:  Supple without thyromegaly, adenopathy, JVD, or carotid bruits.  CHEST:  Symmetrical excursion.  Clear to auscultation without rales,   rhonchi, or wheezing.  HEART:  PMI is not displaced.  Regular rate and rhythm.  Do not  appreciate murmurs, rubs, clicks, or gallops.  All pulses are  symmetrical and intact.  SKIN:  Integument is intact.  ABDOMEN:  Slightly rounded.  Bowel sounds present without organomegaly,  masses, or tenderness.  EXTREMITIES:  Negative cyanosis, clubbing, or edema.  MUSCULOSKELETAL:  Grossly unremarkable.  He does have some slight  anterior chest wall tenderness around the fourth or fifth left rib.  NEURO:  Unremarkable.   LABORATORY DATA:  Chest x-ray shows small left pleural effusions and it  associated atelectasis with interval clearing of right pleural effusion.  No air space disease.  EKG shows normal sinus rhythm, normal axis, first-  degree AV block early repole.  CMET pending.  Urinalysis is  unremarkable.  H and H is 14.3 and 42.8, normal indices, platelets  191,000, WBC is 11.4.  Neutrophils are 78.   IMPRESSION:  Recurrent fever of unknown origin associated with dry  cough, shortness of breath, and pleuritic discomfort.  The only  objective finding is mild increase of his WBC.   IMPRESSION:  History as listed above.   DISPOSITION:  Dr. Percival Spanish reviewed the patient's history, spoke with  and examined the patient and agrees with the above.  Dr. Percival Spanish also  reviewed extensive workup that has been performed by infectious disease  for earlier problems.  We will admit him to the hospital for observation  after talking to Dr. Tommy Medal.  We will check ESR, D-dimer, repeat CT if  D-dimer is abnormal.  Given his symptomology, we will also rule out H1N1  viral transport  median nasal/sinus swab.  We will be maintained in isolation.  We will  continue his home medications.  Dr. Stanford House will see the patient that  morning with further recommendations.  Infectious disease states that  they will follow up with the patient as well.      Sharyl Nimrod, PA-C      [name], MD,  [name]  Electronically Signed    EW/MEDQ  D:  11/23/2007  T:  11/24/2007  Job:  [id number]   cc:   Peter Evener, MD

## 2010-10-03 NOTE — Assessment & Plan Note (Signed)
[place] OFFICE NOTE   [name], [name]                      MRN:          [id number]  DATE:03/31/2007                            DOB:          May 25, 1950    [name] is a very pleasant 60 year old gentleman who presents for  evaluation of mitral regurgitation.   The patient has no prior cardiac history other than an echocardiogram  which is performed in 2003.  At that time he was found to have a mildly  dilated left ventricle but the function was normal.  The left atrium was  mildly dilated.  The patient was found to have mild mitral valve  prolapse and there was mild mitral regurgitation.  He was recently seen  for GI evaluation secondary to heme positive stool.  He apparently had a  colonoscopy that showed polyps.  During that evaluation the patient was  noted to have a murmur.  Therefore, a followup echocardiogram was  performed on March 10, 2007.  The left ventricle was mildly to  moderately dilated and ejection fraction was 60%.  There was severe  prolapse of the posterior mitral valve leaflet with a partial flail  segment per Dr. Ron Parker.  There was severe mitral regurgitation with the  velocity calculated at 66 mL.  The left atrium was moderately dilated.  Because of the above, we were asked to further evaluate.  Notice, since  he has been aware of the valve problem he has complained of dyspnea with  more extreme exertion but not with routine activities.  There is no  orthopnea, PND, pedal edema, presyncope, syncope or chest pain.  He does  occasionally feel his heart pound when he lays down at night, by his  report.   MEDICATIONS:  A multivitamin daily, glucosamine chondroitin, Fish Oil,  Garlic and Saw Palmetto.   He has no known drug allergies.   SOCIAL HISTORY:  He does not smoke and only occasionally consumes  alcohol.   FAMILY HISTORY:  Negative for coronary artery disease.   PAST  MEDICAL HISTORY:  1. There is no hypertension or diabetes mellitus but there is a      history of hyperlipidemia.  2. He has not been willing to take statins in the past apparently      because his father died of liver cancer that he feels is statin      related.  3. He does have a history of a tonsillectomy.   REVIEW OF SYSTEMS:  He denies any headaches or fever or chills.  There  is no productive cough or hemoptysis.  There is no dysphagia,  odynophagia, melena or hematochezia.  There is no dysuria or hematuria.  There is no rash or seizure activity.  There is no orthopnea, PND or  pedal edema.  The remaining systems are negative.   PHYSICAL EXAMINATION TODAY:  Shows a blood pressure 144/86 and his pulse  is 68.  He is well-developed and anxious-appearing.  He is no acute  distress at present.  His skin is warm and dry.  BACK:  Normal.  HEENT:  Normal with normal eyelids.  NECK:  Supple with a right carotid bruit.  The upstroke is normal.  There is no jugular venous distention, I cannot appreciate thyromegaly.  CHEST:  Clear to auscultation with normal expansion.  CARDIOVASCULAR EXAM:  A regular rate and rhythm with a normal S1 and S2.  There is a 3/6 systolic murmur at the apex that radiates to the left  axilla.  I cannot appreciate an S3 or S4.  ABDOMINAL EXAM:  Not tender or distended, positive bowel sounds, no  hepatosplenomegaly, no masses appreciated.  There is no abdominal bruit.  He has 2+ femoral pulses bilaterally, no bruits.  EXTREMITIES:  No edema, I could palpate no cords.  He has 2+ dorsalis  pedis pulses bilaterally.  NEUROLOGIC EXAM:  Grossly intact.   His electrocardiogram shows a sinus rhythm with a first degree block.  There are no ST changes noted.   DIAGNOSES:  1. Severe mitral regurgitation - Based on the echocardiogram report,      the patient has a flail posterior leaflet with severe mitral      regurgitation.  I will review this echocardiogram and I  think we      should also proceed with a transesophageal echocardiogram to      further evaluate the mitral valve and the severity of his mitral      regurgitation.  I have had extensive discussions with he and his      wife today concerning the treatment options if indeed this is the      case.  One would be to do followup serial echocardiograms and      proceed with mitral valve repair when the patient's left      ventricular and systolic dimension is greater than 40 milliliters      or if his left ventricular function ever deteriorates.  We would      also need to consider repair if he develops symptoms.  Note, he      does have some dyspnea on exertion, but only since he has become      aware of his valve.  He may be difficult to follow from a      symptomatic standpoint.  Also note that his left ventricle is      already dilated.  The second alternative would be to proceed with      early repair and decrease the risk of left ventricular      dilatation/decrease function as well as atrial fibrillation in the      future.  We will plan to see him back approximately 1 to 2 weeks      after we do his transesophageal echocardiogram and discuss how we      may proceed.  Given that he has some dyspnea on exertion and mild      left ventricular enlargement already, I would most likely recommend      early repair.  Also note that the patient's blood pressure is      elevated in the office and given his severe mitral regurgitation I      have asked him to be on lisinopril 10 milligrams by mouth daily.  I      will check a BMET in 1 week to follow his potassium and renal      function.  2. Hypertension - As per number 1.  3. Hyperlipidemia - We will check lipids and liver when he returns for  a BMET in 1 week.  We will treat accordingly.  He is very hesitant      to consider a statin as his father died of liver cancer and he      attributes this to a statin.  4. Right carotid bruit - He  will need carotid Dopplers.   We will see him back in 2 to 4 weeks after his transesophageal  echocardiogram.     Denice Bors. Stanford Breed, MD, [facility]  Electronically Signed    BSC/MedQ  DD: 03/31/2007  DT: 03/31/2007  Job #: [id number]

## 2010-10-03 NOTE — Consult Note (Signed)
NEW PATIENT CONSULTATION   [name], BEAVER  DOB:  25-Jul-1950                                        April 21, 2007  CHART #:  [id number]   PRIMARY CARE PHYSICIAN:  Dr. Garnet Koyanagi, D.O.   REASON FOR CONSULTATION:  Mitral valve of prolapse with severe mitral  regurgitation.   HISTORY OF PRESENT ILLNESS:  The patient is a 60 year old white male who  has been previously healthy.  He was first noted to have a heart murmur  on routine physical exam in 2003.  He states that at that time he had an  echocardiogram performed that reportedly showed only mild mitral  regurgitation.  More recently, the patient was referred to Dr. Kirk Ruths with development of symptoms of exertional shortness of breath.  Repeat 2-D echocardiogram was performed March 10, 2007 that reportedly  demonstrated mitral valve prolapse with severe mitral  regurgitation,  probable flail segment of the posterior leaflet of the mitral valve,  ejection fraction 60%, and left ventricular end systolic dimension of  65.4 mm.  The patient was subsequently scheduled for an elective  transesophageal echocardiogram.  This was performed on April 10, 2007  and confirmed the presence of prolapse involving the posterior leaflet  of the mitral valve with severe (4+) mitral regurgitation.  There  appears to be a flail segment of the middle portion of the posterior  leaflet.  The patient has now been referred to consider elective  surgical repair.   REVIEW OF SYSTEMS:  GENERAL:  The patient reports normal appetite.  He  has not been gaining or losing weight.  He is 5 feet 9 inches tall and  weighs approximately 190 pounds.  He does report progressive exertional  fatigue.  CARDIAC:  Notable for progressive exertional shortness of breath.  These  symptoms are still relatively mild, probable functional Class II.  He  states he only notes this when he is exerting himself and he never gets  short of breath  at rest.  He denies any PND, orthopnea, or lower  extremity edema.  He denies any tachycardic palpitations.  He has not  had chest pain.  RESPIRATORY:  Negative.  The patient denies productive cough,  hemoptysis, wheezing.  He only complains of mild exertional shortness of  breath.  GASTROINTESTINAL:  Negative.  The patient has no difficulty swallowing.  He denies hematochezia, hematemesis, melena.  GENITOURINARY:  Negative.  The patient denies urinary urgency,  frequency, or other difficulties.  PERIPHERAL VASCULAR:  Negative.  The patient denies symptoms suggestive  of claudication.  NEUROLOGICAL:  Negative.  The patient denies dizzy spells or syncopal  episodes.  He denies transient monocular blindness.  He denies transient  numbness or weakness involving either upper or lower extremity.  MUSCULOSKELETAL:  Notable only for mild chronic pain in both knees.  This is stable and does not limit him much physically.  PSYCHIATRIC:  Negative.  The patient does admit to being somewhat  anxious regarding his recently discovered diagnosis.  HEENT:  Negative.  The patient sees his dentist regularly and does not  have any dental problems.  His dentist is aware of his heart murmur.  HEMATOLOGICAL:  Negative.  The patient denies any bleeding diaphysis or  easy bruising.   PAST MEDICAL HISTORY:  1. Hypertension.  2. Hyperlipidemia.  PAST SURGICAL HISTORY:  Tonsillectomy.   FAMILY HISTORY:  Noncontributory.   SOCIAL HISTORY:  The patient is married and lives with his wife here in  [place].  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  He has 2 children.  He is a nonsmoker.  He denies  excessive alcohol consumption.   CURRENT MEDICATIONS:  1. Lisinopril 5 mg daily.  2. Simvastatin 40 mg daily.  3. Aspirin 81 mg daily.  4. Glucosamine chondroitin.  5. Fish oil.  6. Garlic.  7. Saw palmetto.  8. Multivitamin.   DRUG ALLERGIES:  None known.    PHYSICAL EXAMINATION:  General:  The patient is a well-appearing, mildly  obese white male who appears his stated age in no acute distress.  Vital  signs:  Blood pressure is 135/79, pulse 83, oxygen saturation 95% on  room air.  HEENT:  Unrevealing.  Neck:  Supple.  There is no cervical or  supraclavicular lymphadenopathy.  There is no jugular venous distension.  Auscultation of the chest reveals clear breath sounds, which are  symmetrical bilaterally.  No wheezes or rhonchi are noted.  Cardiovascular exam:  Regular rate and rhythm.  There is a prominent  grade 4-8/1 holosystolic murmur heard best at the apex with radiation  all across the precordium into the axilla.  No diastolic murmurs are  noted.  Abdomen:  Soft and nontender.  There are no palpable masses.  Bowel sounds are present.  Extremities:  Warm and well perfused.  There  is no lower extremity edema.  Distal pulses are palpable in the dorsalis  pedis and posterior tibial position.  There is no sign of venous  insufficiency.  Skin:  Clean, dry, and healthy appearing throughout.  Rectal:  Deferred.  GU:  Deferred.  Neurologic examination:  Grossly  nonfocal.   DIAGNOSTIC TESTS:  Transesophageal echocardiogram performed by Dr.  Stanford Breed on April 10, 2007 is reviewed.  This demonstrates classical  characteristics of fibroelastic deficiency with what appears to be flail  segment of the middle scalp (P2) of the posterior leaflet of the mitral  valve.  There is severe (4+) mitral regurgitation.  There is left atrial  enlargement.  The aortic valve of appears normal.  There is normal left  ventricular systolic function.   IMPRESSION:  Mitral valve prolapse with severe mitral regurgitation and  mild symptoms of exertional shortness of breath.  I believe it would  make sense to go ahead and proceed with elective mitral valve of repair  in the reasonably near future.  The patient will need elective left and  right heart  catheterization prior to surgery.  He is otherwise healthy  and a relatively good candidate.   PLAN:  I have discussed the options at length with the patient and his  wife.  Alternative treatment strategies have been discussed including  and most notably the relative risks and benefits of proceeding with  surgery soon rather than continued close follow-up and serial  echocardiograms.  All of their questions have been addressed.  The  likelihood of mitral valve repair has been discussed.  Alternative  surgical approaches have been reviewed.  The patient desires to wait  until after the holidays and will plan to return to see [place] here in the  office on May 26, 2007.  By that time, he will have made a decision  as to when he would like to consider scheduling surgery and possibly  proceed with heart  catheterization at that time.   Valentina Gu. Roxy Manns, M.D.  Electronically Signed   CHO/MEDQ  D:  04/21/2007  T:  04/22/2007  Job:  [id number]   cc:   Denice Bors. Stanford Breed, MD, [place], DO

## 2010-10-03 NOTE — Assessment & Plan Note (Signed)
[place] OFFICE NOTE   Peter, House                      MRN:          [id number]  DATE:12/10/2007                            DOB:          17-Dec-1950    [name] is a pleasant gentleman.  He has a history of mitral valve  repair secondary to severe mitral regurgitation in February.  He also  had an atrial flutter ablation in March.  He has had problems with  recurrent low-grade fevers.  He was recently readmitted to [facility] secondary to dyspnea and fever.  This was on November 23, 2007.  His  temperature had increased to 102.  During that admission, he was noted  to have a small left effusion, mildly elevated liver functions, and his  electrocardiogram suggested the possibility of mild pericarditis.  He  did have an echocardiogram on November 25, 2007, which showed normal LV  function.  There was an annuloplasty ring in the mitral position with  mild mitral stenosis and trivial mitral regurgitation.  The left axis  was mildly to moderately enlarged.  The patient had an extensive workup  again with his cultures being negative.  His SGOT and PT were mildly  elevated.  Gastroenterology saw the patient and felt that this may be  related to inflammatory reaction.  ID also felt that this may be an  inflammatory reaction (possible Delestrogen).  The patient was placed on  colchicine and discharged.  Since then, he has had some fatigue, but he  denies any dyspnea, chest pain, palpitations or syncope.  There has been  no fevers, chills, productive cough or diarrhea.   MEDICATIONS:  1. Aspirin 81 mg p.o. daily.  2. Lasix 20 mg p.o. daily.  3. Multivitamin.  4. Glucosamine.  5. Colchicine 0.6 mg p.o. b.i.d.   PHYSICAL EXAMINATION:  VITAL SIGNS:  Blood pressure of 110/60 and his  pulse is 80.  He weighs 210 pounds.  HEENT:  Normal.  NECK:  Supple.  CHEST:  Clear.  CARDIOVASCULAR:  Regular rate and  rhythm.  There is no murmur noted.  ABDOMEN:  No tenderness.  EXTREMITIES:  No edema.  There is no peripheral stigmata of SBE.   DIAGNOSES:  1. Status post mitral valve repair - [name] is doing well from a      symptomatic standpoint with no chest pain or shortness of breath.      He will continue with SBE prophylaxis.  2. History of recurrent fevers - we feel that this is most likely a      post inflammatory reaction from his surgery.  He has been on      colchicine 0.6 mg p.o. b.i.d.  I would decrease this to 0.6 mg p.o.      daily, and we will wean this in the future.  Note, if he has      recurrent fevers in the future then we may repeat his TEE,      otherwise I find no evidence of endocarditis.  If he does have  recurrent fevers, we will also most likely add a nonsteroidal.  We      will plan to repeat his CBC, liver functions, and BMET today.  3. History of atrial flutter ablation - the patient remains in sinus      rhythm on examination today.  His Coumadin has been discontinued.  4. Recent left pleural effusion - he will continue on his present dose      of Lasix, and this may be a postinflammatory effusion.  5. History of cerebrovascular disease - he will continue on his      aspirin.  We will consider adding a statin in the future once we      can resolve his increased liver functions and his fevers.  6. Coronary artery disease - he will continue on his aspirin and again      we will consider adding a statin in the future.  7. Hypertension - his blood pressure is adequately controlled.  8. Hyperlipidemia - as per above.   I will see him back in 3 months.     Denice Bors Stanford Breed, MD, [facility]  Electronically Signed    BSC/MedQ  DD: 12/11/2007  DT: 12/12/2007  Job #: [id number]   cc:   Valentina Gu. Roxy Manns, M.D.

## 2010-10-03 NOTE — Assessment & Plan Note (Signed)
[place]                         GASTROENTEROLOGY OFFICE NOTE   GORAN, OLDEN                      MRN:          [id number]  DATE:03/04/2007                            DOB:          02-14-1951    REFERRING PHYSICIAN:  Rosalita Chessman, DO   ,   PROBLEM:  Hemoccult positive stool.   HISTORY:  [name] is a pleasant, 60 year old, white male known to Dr.  Sharlett Iles last seen about 3-1/2 years ago at which time he underwent  colonoscopy for screening. He was found to have a few small polyps which  were removed. Path on these polyps consistent with adenomatous polyps  and he was advised to have followup in 3 years.   This time the patient has had recent physical with Dr. Etter Sjogren and was  Hemoccult positive at the time of rectal exam and is referred back. The  patient has absolutely no GI complaints, no complaints of heartburn,  indigestion, dysphagia or odynophagia. No dyspeptic symptoms, bowel  habits have been normal. He has not noted any melena or hematochezia. He  says that he was planning to do the followup colonoscopy but has been  busy with his mother whom he has been caring for, etc. He has not been  on any aspirin or NSAID.   CURRENT MEDICATIONS:  1. Multivitamin daily.  2. Glucosamine chondroitin 2 p.o. daily.  3. Fish oil supplement daily.  4. Garlic 2 p.o. daily.  5. Saw Palmetto 2 p.o. daily.   ALLERGIES:  No known drug allergies.   PAST MEDICAL HISTORY:  Pertinent for colon polyps and mild  hyperlipidemia. No prior surgeries.   SOCIAL HISTORY:  The patient is married, has 2 children. He is employed  as a Geneticist, molecular. He is a nonsmoker, nondrinker.   FAMILY HISTORY:  Positive for diabetes in his mother, negative for colon  cancers. He does have a daughter that has Crohn's disease and father had  liver cancer which he believes was a primary liver cancer.   REVIEW OF SYSTEMS:  Positive for arthritic symptoms and a heart  murmur,  otherwise completely negative review of systems.   PHYSICAL EXAMINATION:  GENERAL:  Well-developed, white male in no acute  distress.  VITAL SIGNS:  Height is 5 foot 9, weight 200.8, blood pressure 120/68,  pulse is 84.  HEENT:  Normocephalic, atraumatic. EOMI, PERRLA. Sclera anicteric.  NECK:  Supple and without nodes.  CARDIOVASCULAR:  Regular rate and rhythm with S1 and S2. He has a harsh  4/6 systolic murmur.  PULMONARY:  Clear to A&P.  ABDOMEN:  Soft and nontender. No palpable mass or hepatosplenomegaly.  RECTAL:  Not repeated. Recently Hemoccult positive.   IMPRESSION:  58. A 60 year old white male with Hemoccult positive stool and history      of adenomatous colon polyps due for followup colonoscopy.  2. Heart murmur ,type systolic ,question aortic stenosis. The patient      has not had echocardiogram to his knowledge.   PLAN:  1. Schedule colonoscopy at his convenience.  2. Schedule 2-D echo.      Amy Esterwood, PA-C  Electronically Signed      Loralee Pacas. Sharlett Iles, MD, [place], [place]  Electronically Signed   AE/MedQ  DD: 03/04/2007  DT: 03/05/2007  Job #: [id number]   cc:   Rosalita Chessman, DO

## 2010-10-03 NOTE — Assessment & Plan Note (Signed)
[place] OFFICE NOTE   Peter, House                      MRN:          [id number]  DATE:10/29/2007                            DOB:          [birth year]    [name] is a pleasant gentleman who is status post mitral valve  repair secondary to severe mitral regurgitation in February.  Postoperatively, he was noted to be in atrial flutter and had atrial  flutter ablation in March.  He also had some fevers with a negative  workup.  I last saw him on Oct 06, 2007.  At that time, he did state  that he had a low-grade fever.  We checked a blood culture, which was  normal with no growth.  He also had a CBC that showed a white blood cell  count of 8.  Note, we had planned to do a urinalysis, but he did not  have it done at the time, and his temperatures resolved, and he had no  dysuria, and this was therefore not pursued.  Since he was seen in the  office, he is doing well.  He denies any recurrent fevers, chills,  productive cough, diarrhea, or dysuria.  There has been no rashes.  He  denies any dyspnea (he was having some dyspnea previously, we resumed  his Lasix 20 mg p.o. daily).  He has not had hemoptysis.  There has been  no chest pain or pedal edema.   CURRENT MEDICATIONS:  1. Aspirin 81 mg p.o. daily.  2. Coumadin as directed.  3. Lasix 20 mg p.o. daily.  4. Multivitamin.  5. Glucosamine.  6. Fish oil.   PHYSICAL EXAMINATION:  Blood pressure 120/80, his pulse is 82, and he  weighs 200 pounds.  HEENT:  Normal.  NECK:  Supple.  CHEST:  Clear.  CARDIOVASCULAR:  Regular rate and rhythm.  There are no murmurs noted.  ABDOMEN:  No tenderness.  EXTREMITIES:  No edema.   DIAGNOSES:  1. Status post mitral valve repair - [name] is doing well from a      symptomatic standpoint.  We will plan to continue with subacute      bacterial endocarditis prophylaxis and follow up echocardiograms in  the future.  2. History of atrial flutter ablation - the patient remains in sinus      rhythm on examination today.  We will discontinue his Coumadin, but      continue with his aspirin.  3. Recent left pleural effusion/volume excess - he will continue on      the present dose of Lasix at 20 mg p.o. daily.  When I see him back      in 3 months, we will most likely repeat his chest x-ray.  Note, his      recent chest x-ray was interpreted as a left effusion with left      atelectasis or pneumonia.  He is not having any symptoms of      pneumonia including no productive cough and no fevers.  Again, we      will plan  to repeat his chest x-ray when I see him back in 3      months.  4. History of cerebrovascular disease - he will continue on his      aspirin.  When I see him back in 3 months, we will consider adding      a statin.  Note, his liver functions were increased previously on      Zocor.  We can consider Pravachol or Crestor.  5. Coumadin therapy - we will discontinue this as described above.  6. Coronary artery disease - he will continue on his aspirin, and we      will consider his statin when I see him next.  7. Hypertension - his blood pressure is adequately controlled.  8. Hyperlipidemia - again, we will consider a statin in the future.      He has some hesitancy as his father had difficulties with this, and      he also had increased liver functions on Zocor, that have now      normalized.  However, I would like for him to be on this in the      future, and again, we will consider this in 3 months.     Denice Bors Stanford Breed, MD, [facility]  Electronically Signed    BSC/MedQ  DD: 10/29/2007  DT: 10/30/2007  Job #: [id number]

## 2010-10-03 NOTE — Cardiovascular Report (Signed)
NAMEMACALLISTER, ASHMEAD NO.:  [id number]   MEDICAL RECORD NO.:  [id number]          PATIENT TYPE:  OIB   LOCATION:  1962                         FACILITY:  [facility]   PHYSICIAN:  Shaune Pascal. Bensimhon, MDDATE OF BIRTH:  Jan 21, 1951   DATE OF PROCEDURE:  05/08/2007  DATE OF DISCHARGE:                            CARDIAC CATHETERIZATION   CARDIOLOGIST:  Dr. Kirk Ruths.   PATIENT IDENTIFICATION:  [name] is a very pleasant 60 year old  male with no significant past medical history.  He has been having  progressive dyspnea on exertion and decreased exercise tolerance.  He  was found to have severe mitral regurgitation in the setting of mitral  valve prolapse.  He is referred for diagnostic cardiac catheterization  to evaluate his coronary anatomy and right heart pressures prior to  valve surgery.  This was done in the outpatient lab.   PROCEDURES PERFORMED:  1. Right heart cath.  2. Left heart cath.  3. Left ventriculogram.  4. Selective coronary angiography.   DESCRIPTION OF PROCEDURE:  The risks and indication of the procedure  were explained.  Consent was signed and placed on the chart.  A 4-French  arterial sheath was placed in the right femoral artery using a modified  Seldinger technique.  Standard catheters including JL-3, RCA and angled  pigtail were used for procedure.  All catheter exchanges made over a  wire.  A 7-French venous sheath was placed in the right femoral vein  using modified Seldinger technique and a standard Swan-Ganz catheter was  used.  There was no apparent complications.   HEMODYNAMIC RESULTS:  Right atrial pressure mean of 5, RV pressure 25/4.  PA pressure 20/7 with a mean of 14.  Pulmonary capillary wedge pressure  mean of 9 with a V-wave to 13.  Fick cardiac output was 5.7 liters per  minute.  Cardiac index was 2.8 liters per minute per m2.  Central aortic  pressure was 91/55 with a mean of 72.  LV pressure is 90/1 with an EDP  of 9.  There was no aortic stenosis and no mitral stenosis.  Pulmonary  vascular resistance was normal at 0.9 Woods units.   Left main had approximately 40% ostial narrowing.  There was no evidence  of significant calcification or plaquing.  I suspect this was probably  just an anatomical variant.  This was not flow-limiting.   The LAD was a long vessel coursing the apex.  It gave off two small  diagonals.  There was  a 30% lesion in the proximal portion.   Left circumflex was a large vessel, it gave off two large OMs.  It was  angiographically normal.   Right coronary artery was a large dominant vessel, it gave off PDA and  three posterolaterals, there was a 30% lesion in the proximal and  midportion.   Left ventriculogram done in the RAO position showed ejection fraction of  approximately 55%.  There was 3 to 4+ mitral regurgitation.   ASSESSMENT:  1. Severe mitral regurgitation.  2. A 40% ostial left main narrowing.  I suspect this is a probable  anatomical variant and it is not flow-limiting.  3. Mild nonobstructive coronary artery disease.  4. Normal right-sided pressures.   I reviewed the films with Dr. Olevia Perches, who agrees that left main stenosis  is not hemodynamically significant.  We will have him follow up with Dr.  Roxy Manns for mitral valve repair.      Shaune Pascal. Bensimhon, MD  Electronically Signed     DRB/MEDQ  D:  05/08/2007  T:  05/08/2007  Job:  [id number]

## 2010-10-03 NOTE — Op Note (Signed)
NAMEBARAK, [name] NO.:  [id number]   MEDICAL RECORD NO.:  [id number]          PATIENT TYPE:  INP   LOCATION:  2008                         FACILITY:  [facility]   PHYSICIAN:  Deboraha Sprang, MD, FACCDATE OF BIRTH:  02/22/1951   DATE OF PROCEDURE:  08/06/2007  DATE OF DISCHARGE:                               OPERATIVE REPORT   PREOPERATIVE DIAGNOSIS:  Post mitral valve repair atrial flutter.   POSTOPERATIVE DIAGNOSIS:  Two atrial flutters:  A.  Left atrial.  B.  Typical.   PROCEDURE:  Invasive electrophysiological study, arrhythmia mapping x2,  and radio frequency catheter ablation x1.   After obtaining informed consent, the patient was brought to the  electrophysiology laboratory and placed on the fluoroscopic table in a  supine position. After routine prep and drape, cardiac catheterization  was performed with local anesthesia and conscious sedation.  Noninvasive  blood pressure monitoring, transcutaneous oxygen saturation monitoring,  and end tidal CO2 monitoring were performed continuously throughout the  procedure.  Following the procedure, the catheters were removed,  hemostasis was obtained, and the patient was transferred to the floor in  stable condition.   CATHETERS:  5-French quadripolar catheter was inserted via the left  femoral vein to the AV junction.  7-French dual decapolar catheter was inserted via the left femoral vein  to the tricuspid annulus.  6-French octapolar catheter was inserted via the right femoral vein to  the coronary sinus.  8-French 8-mm deflectable tip ablation catheter was inserted via the  right femoral vein to the cavotricuspid isthmus.   Surface leads 1, aVF, and V1 were monitored continuously throughout the  procedure.  Following insertion of the catheters, the stimulation  protocol included:  Incremental atrial pacing  Incremental ventricular pacing.  Single atrial extra stimuli at a paced cycle length of 500  milliseconds  Burst atrial pacing  Entrainment mapping.   RESULTS:  Surface electrocardiogram:   Initial:  Rhythm:  Sinus; RR interval:  677 milliseconds; PR interval 265  milliseconds; QRS duration:  123 milliseconds; QT interval 379  milliseconds; bundle branch block:  Absent; pre-excitation:  Absent.  AH interval 215 milliseconds; 32 milliseconds.   Final:  Rhythm:  Sinus; RR interval 628 milliseconds; PR interval 248  milliseconds; QRS duration 110 milliseconds; QT interval 370  milliseconds; P-wave duration 136 milliseconds.   AH-HV interval was 267 milliseconds.  I did not see His electrogram of  which I could be certain.   AV nodal conduction:  Antegrade dissection was continuous, retrograde  conduction was dissociated.   Accessory pathway function:  No evidence of an accessory pathway was  identified.   Ventricular response to programmed stimulation was normal for  ventricular stimulation as described previously.   Arrhythmias induced:  Two atrial flutters were induced.  The first was  induced with coronary sinus pacing at 190 milliseconds.  Activation on  the right side came in from both superiorly and inferiorly and collided  on the lateral wall.  Earliest atrial activation was noted to be in the  distal coronary sinus and the post pacing interval from the distal  coronary sinus was close to the tachycardia cycle length demonstrated  left atrial origin and probably related to the atriotomy.   During attempts to terminate this tachycardia, typical atrial flutter  was induced.  A cycle length was 220 milliseconds and entrainment  mapping from the cavotricuspid isthmus had a return cycle length also of  the tachycardia cycle length.   Electrocardiograms of both these two flutters were fairly similar.  Because of that, I could not be sure which of these was more likely the  patient's clinical arrhythmia.  We elected, however, to pursue ablation  of the cavotricuspid  isthmus dependent flutter which was successfully  accomplished.   RADIOFREQUENCY ENERGY:  A total of 26 minutes and 1 second of RF energy  was applied across the cavotricuspid isthmus. Initially, the flutter  circuit terminated, cavotricuspid isthmus block was seen.  There was  resumption of cavotricuspid isthmus conduction. A line drawn at about 6  o'clock instead of the initial line of 5 o'clock successfully  interrupted cavotricuspid isthmus conduction.  A1/A2 intervals, however,  never exceeded about 120 milliseconds.   IMPRESSION:  1. Normal sinus function.  2. Abnormal atrial function manifested by two different atrial      flutters, one cavotricuspid isthmus dependent, one left atrial      dependent, the former of which was successfully ablated.  3. Abnormal AV nodal function manifested by prolonged AH intervals.  4. Normal His-Purkinje system function.  5. No accessory pathway.  6. Normal ventricular response to programmed stimulation.   SUMMARY OF CONCLUSIONS:  The results of electrophysiological testing  identified two atrial flutters, one left atrial, one cavotricuspid  isthmus dependent. Catheter ablation across the cavotricuspid isthmus  eliminated this tachycardia.  We will need to see what the other one  does and it may abate as the patient continues to heal from his mitral  valve repair.      Deboraha Sprang, MD, [facility]  Electronically Signed     SCK/MEDQ  D:  08/06/2007  T:  08/06/2007  Job:  [id number]   cc:   Denice Bors. Stanford Breed, MD, [facility]

## 2010-10-03 NOTE — Discharge Summary (Signed)
[name], Peter NO.:  [id number]   MEDICAL RECORD NO.:  [id number]          PATIENT TYPE:  INP   LOCATION:  2008                         FACILITY:  [facility]   PHYSICIAN:  Denice Bors. Stanford Breed, MD, FACCDATE OF BIRTH:  [birth year]   DATE OF ADMISSION:  08/04/2007  DATE OF DISCHARGE:  08/12/2007                               DISCHARGE SUMMARY   PROCEDURES:  1. Invasive electrophysiology study.  2. Arrhythmia mapping x2.  3. Radiofrequency catheter ablation x1.  4. Transesophageal electrocardiogram.  5. CT of the abdomen and pelvis with contrast.  6. CT of the chest with contrast.   PRIMARY/FINAL DISCHARGE DIAGNOSIS:  Atrial flutter.   SECONDARY DIAGNOSES:  1. Fever greater than 102, possibly drug reaction.  2. Chronic anticoagulation with Coumadin, INR at discharge 2.3.  3. Status post quadrangular resection of the posterior leaflet of the      mitral valve with a sliding reduction, posterior leaflet      annuloplasty and 36 mm Memo 3D ring annuloplasty on June 24, 2007.  4. Hyperlipidemia.  5. Mild cerebrovascular disease with 40%-59% right internal carotid      artery stenosis, 40%-49% left internal carotid artery stenosis.  6. Hypertension.  7. Status post cardiac catheterization in December 2008, with left      main 40%, left anterior descending 30%, circumflex normal, and      right coronary artery 30%.  8. Preserved left ventricular function with an ejection fraction of      50%-55% by transesophageal echocardiography on August 11, 2007.   TIME AT DISCHARGE:  47 minutes.   HOSPITAL COURSE:  [name] is a 60 year old male with a history of  mitral valve repair in February 2009.  He went to Cardiac Rehab on the  day of admission and was complaining of some weakness as well as some  dyspnea on exertion.  His heart rate was found to be 157 and he was  brought to the emergency room and admitted for further evaluation.   An EP consult was  called and he was seen by Dr. Caryl Comes who felt that  ablation was the best option.  The EP study showed left atrial flutter  and typical atrial flutter and he had radiofrequency catheter ablation  on August 06, 2007.   Postprocedure, he developed some fevers.  His temperature reached 102 at  times.  A CT of the chest was performed which showed small bilateral  pleural effusions and a tiny pericardial effusion, but no evidence of  mediastinal or chest wall abscess.  He was started on antibiotics and  blood cultures were drawn.  An ID consult was called and he was seen by  Dr. Johnnye Sima who recommended checking a sed rate for a possible  postpericardiotomy syndrome.  The sed rate was only mildly elevated at  30 and other labs were checked including total blood LDH which was  within normal limits at 214.  IgG and IgM were both within normal  limits, but C-reactive protein was significantly elevated at 138.7.  Hepatitis B surface antigen was  negative, but hepatitis B surface  antibody was positive.  Hepatitis C antibody and HIV antibodies were  nonreactive.  Cytomegalovirus antibody was negative and hepatitis A  antibody was also positive.  ANA was negative and Epstein-Barr virus IgG  was elevated, but IgM was within normal limits.  Blood cultures were  negative.  PPD was placed and was also negative.  Dr. Tommy Medal  reviewed  all the data and felt that TEE would be the best test to rule out any  problems with vegetations on the mitral valve.  This was performed on  August 11, 2007, and showed no evidence of SBE and the mitral valve  repair looked normal.  Dr. Roxy Manns had followed [name] periodically  during his hospital stay, but since the TEE was negative he did not feel  that any further evaluation of the mitral valve was indicated at this  time.  Dr. Tommy Medal and Dr. Johnnye Sima felt that since the blood cultures  were negative and the TEE results were negative, antibiotics could be  discontinued  and he could be watched off antibiotics.  Repeat blood  cultures were drawn on August 08, 2007, and those are still pending.  Lower extremity Dopplers were performed and showed no evidence of DVT.  His fever did not recur off antibiotics and there was a question that  the fever was medication related.  All medications were stopped except  for aspirin and low-dose Lasix as well as his Coumadin.  His INR at  discharge was therapeutic.  By August 12, 2007, [name] was  considered stable for discharge and was cleared to follow up as an  outpatient in the Coumadin Clinic and with Dr. Nelson Chimes, he is  to return to the hospital if he develops recurrent fevers.   DISCHARGE INSTRUCTIONS:  His activity level is to be increased  gradually.  He is to call our office for any problems with his ablation  sites.  He is also to contact us for any recurrent fevers.  He is to eat  a heart healthy diet.  [name] is cleared to return to Cardiac  Rehab.  He is to follow up with Dr. Roxy Manns and Dr. Etter Sjogren as needed and he  has a followup appointment with Dr. Stanford Breed on August 22, 2007, at 2:15.  He has a Coumadin Clinic appointment and is to get blood work done on  August 18, 2007.   DISCHARGE MEDICATIONS:  1. Aspirin 81 mg daily.  2. Warfarin 5 mg daily except 7.5 mg on Saturday.  3. Furosemide 20 mg daily.  4. On hold are simvastatin, K-Dur, Lasix 40 mg, Centrum Silver, fish      oil, and ICAPS vitamins.      Rosaria Ferries, PA-C      Denice Bors. Stanford Breed, MD, [facility]  Electronically Signed    RB/MEDQ  D:  08/12/2007  T:  08/13/2007  Job:  [id number]   cc:   Valentina Gu. Roxy Manns, M.D.  Rosalita Chessman, DO

## 2010-10-03 NOTE — Discharge Summary (Signed)
NAMERIHAN, SCHUELER NO.:  [id number]   MEDICAL RECORD NO.:  [id number]          PATIENT TYPE:  INP   LOCATION:  2020                         FACILITY:  [facility]   PHYSICIAN:  Valentina Gu. Roxy Manns, M.D. DATE OF BIRTH:  15-Apr-1951   DATE OF ADMISSION:  06/24/2007  DATE OF DISCHARGE:  06/29/2007                               DISCHARGE SUMMARY   FINAL DIAGNOSIS:  Mitral valve prolapse with severe mitral  regurgitation.   IN-HOSPITAL DIAGNOSES:  1. Volume overload postoperatively.  2. Acute blood loss anemia postoperatively.   SECONDARY DIAGNOSES:  1. Hypertension.  2. Hyperlipidemia.  3. Status post tonsillectomy.   IN-HOSPITAL OPERATIONS AND PROCEDURES:  Mitral valve repair with  quadrangular resection posterior leaflet, with sliding reduction  posterior leaflet annuloplasty and 36 mm MEMO 3-D ring annuloplasty.   HISTORY AND PHYSICAL AND HOSPITAL COURSE:  The patient is a 60 year old  male first noted to have a heart murmur on routine physical exam in  2003.  He apparently had an echocardiogram done at that time  demonstrating mild mitral regurgitation.  The patient has more recently  developed symptoms of exertional shortness of breath.  An echocardiogram  performed in the fall of 2008 was notable for mitral valve prolapse with  severe mitral regurgitation, possible flail segment of the posterior  leaflet of the mitral valve.  The patient underwent TEE that confirmed  the presence of prolapse involving a portion of the posterior leaf of  mitral valve and severe, 4+ mitral regurgitation.  Left and right heart  catheterization performed on May 08, 2007 demonstrated no  significant coronary artery disease.  The patient was seen and evaluated  by Dr. Roxy Manns.  Dr. Roxy Manns discussed with the patient undergoing mitral  valve repair.  He discussed risks and benefits with the patient.  The  patient acknowledged understanding and agreed to proceed.  Surgery was  scheduled for June 24, 2007.   PAST MEDICAL HISTORY AND PHYSICAL EXAMINATION:  For details, please see  dictated H&P.   The patient was taken to the operating room on June 24, 2007 where he  underwent mitral valve repair using a quadrangular resection of  posterior leaflet with sliding reduction posterior leaflet annuloplasty  and 36 mm MEMO 3-D ring annuloplasty.  The patient tolerated this  procedure well and was transferred to the intensive care unit in stable  condition.  Postoperatively, the patient was noted to be hemodynamically  stable.  He was extubated the evening of surgery.  Post extubation, the  patient was noted to be alert, oriented x4, neurologically intact.  Post  extubation, the patient was placed on nasal cannula.  He was satting  greater than 90% on room air.  Chest x-ray done on postoperative day #1  was clear, and the patient had minimal drainage from chest tubes.  Chest  tube was discontinued in normal fashion.  Follow-up chest x-ray remained  stable with no significant atelectasis, effusions or pneumothorax noted.  The patient was encouraged to use his incentive spirometer.  He was  eventually able to be weaned off oxygen, satting greater than 90%  on  room air.  Postoperatively, the patient was in normal sinus rhythm.  Blood pressure was stable.  He was started on low-dose beta blocker.  The patient remained in normal sinus rhythm postoperatively.  He was  started on Coumadin for his mitral valve repair.  Daily PT/INR was  obtained.  The patient's INR was nearing therapeutic prior to discharge  home.  Last INR on postoperative  day #3 was 1.6.  The patient did have  slight acute blood loss anemia postoperatively with hemoglobin and  hematocrit dropping to 10.6 and 30.6 on postoperative day #1.  This was  followed and remained stable during the remainder of his postoperative  course.  He also had slight volume overload postoperatively.  He was  started on  low-dose diuretics and was back towards baseline prior to  discharge home.  Postoperatively, the patient was out of bed, ambulating  well without difficulty.  He was ambulating without assistance prior to  discharge home.  He was tolerating diet well.  No nausea or vomiting  noted.   On postoperative day #3, the patient was noted to be afebrile.  Vital  signs stable.  He was satting greater than 90% on room air.  He was in  normal sinus rhythm.  Pulmonary status was stable.  All incisions were  clean, dry and intact and healing well.  INR was 1.6.  Labs the day  prior showed a white count of 12.2, hemoglobin of 10.6, hematocrit 31,  platelet count 72.  Sodium was 131, potassium 3.7, chloride of 98,  bicarbonate of 27, BUN of 17, creatinine of 1.18, glucose of 117.  The  patient is tentatively ready for discharge home in the a.m. pending he  remains stable.   FOLLOW-UP APPOINTMENTS:  Follow-up appointment has been arranged with  Dr. Prescott Gum for July 25, 2007 at 2:15 p.m.  The patient will need to  obtain PA and lateral chest x-ray 30 minutes prior to this appointment.  He will need to follow up with Dr. Stanford Breed in 2 weeks.  He will need to  contact Dr. Stanford Breed office to make these arrangements.  The patient  will also need to obtain PT/INR levels on June 30, 2007 in Dr.  Jacalyn Lefevre office.  He will need to contact them to make these  arrangements.   ACTIVITY:  The patient is told to ambulate 3-4 times per day and  progress as tolerated.  Continue his breathing exercises.  He is told no  driving until released to do so, no heavy lifting over 10 pounds.   INCISIONAL CARE:  The patient is told to shower, washing his incisions  using soap and water.  He is to contact the office if he develops any  drainage or opening from any of his incision sites.   DIET:  The patient is educated on diet to be low-fat, low-salt.   DISCHARGE MEDICATIONS:  1. Aspirin 81 mg daily.  2. Toprol-XL  25 mg daily.  3. Zocor 40 mg daily.  4. Oxycodone 5 mg 1-2 tablets q.4-6h. p.r.n..  5. Coumadin will be dosed based on the patient's discharge PT/INR      level.  6. Multivitamins daily.      Darlin Coco, PA      Valentina Gu. Roxy Manns, M.D.  Electronically Signed    KMD/MEDQ  D:  06/27/2007  T:  06/30/2007  Job:  [id number]   cc:   Denice Bors. Stanford Breed, MD, [facility]

## 2010-10-03 NOTE — Discharge Summary (Signed)
NAMEMARQUICE, [name] NO.:  [id number]   MEDICAL RECORD NO.:  [id number]          PATIENT TYPE:  INP   LOCATION:  4714                         FACILITY:  [name]   PHYSICIAN:  Peter Bors. Stanford Breed, MD, FACCDATE OF BIRTH:  12/16/1950   DATE OF ADMISSION:  11/23/2007  DATE OF DISCHARGE:  11/25/2007                               DISCHARGE SUMMARY   PRIMARY CARDIOLOGIST:  Peter Bors. Stanford Breed, MD, [name].   PRIMARY CARE [name]:  Peter Chessman, DO   ELECTROPHYSIOLOGIST:  Peter Sprang, MD, [name].   INFECTIOUS DISEASE PHYSICIAN:  Peter Evener, MD.   GI CONSULTANT:  Peter Riffle. Fuller Plan, MD, [name]   DISCHARGE DIAGNOSIS:  Recurrent fevers, presumed inflammatory reaction.   SECONDARY DIAGNOSES:  1. Status post mitral valve repair.  2. Atrial flutter status post ablation.  3. History of left pleural effusion.  4. History of cerebrovascular disease.  5. Coronary artery disease.  6. Hypertension.  7. Hyperlipidemia.   ALLERGIES:  No known drug allergies.   PROCEDURES:  A 2-D echocardiogram revealing EF of 55-60% without  regional wall motion abnormalities.  Mild mitral stenosis with  functioning annuloplasty ring.  Transmitral gradient of 7 mmHg.  Mild to  moderately dilated left atrium.   HISTORY OF PRESENT ILLNESS:  A 60 year old Caucasian male with the above  problem list who presented to [name] ED on November 23, 2007, secondary  to recurrent low-grade fevers in the 99-102 range.  With his recent  history of mitral valve, concern was for endocarditis.  The patient was  admitted for further evaluation.   House COURSE:  Blood cultures were obtained and showed no growth.  Internal Medicine was consulted and felt that his presentation was  likely a result of inflammatory process and recommended colchicine.  H1N1 virus test was normal as was Brucella, Bartonella, Coxsackie,  Legionella, Q fever, and chlamydia antibody panels.  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   The patient was also noted to have elevated liver function tests with an  AST of 48 and ALT of 89, rising to 69 and 141 respectively.  The patient  previously had elevated LFTs while on statin therapy, however, that had  been discontinued long prior to admission.  Gastroenterology was  consulted, and an abdominal ultrasound was performed showing fatty liver  but otherwise, no acute findings to explain his elevated LFTs.  CT the  chest was also performed and was negative for pulmonary embolism.   We obtained a 2-D echocardiogram to reevaluate his mitral valve repair  and this showed normal LV function with a mitral annuloplasty ring and  mild mitral stenosis with a mean transmitral gradient of 7 mmHg.  Mr.  House had no additional fevers while hospitalized and was discharged  home on November 25, 2007.   DISCHARGE LABORATORY DATA:  Hemoglobin 13.2, hematocrit 39.5, WBC 7.5,  and platelets 214.  Sodium 136, potassium 3.9, chloride 103, CO2 of 25,  BUN 12, creatinine 0.8, glucose 114, total bilirubin 0.7, alkaline  phosphatase  91, AST 69, ALT 141, total protein 6.8, albumin 3.2, and  calcium 8.7.  C-reactive protein 24.2.  Serologies as outlined in the  House course.  Urinalysis was negative.   DISPOSITION:  The patient was discharged home on November 25, 2007.   FOLLOWUP APPOINTMENTS:  The patient will follow up with Dr. Kirk Ruths on December 10, 2007.   DISCHARGE MEDICATIONS:  1. Tamiflu 75 mg b.i.d. for 3 days.  2. Colchicine 0.6 mg b.i.d.  3. Aspirin 81 mg daily.  4. Lasix 20 mg daily.   OUTSTANDING LABORATORY STUDIES:  None.   DURATION OF DISCHARGE/ENCOUNTER:  40 minutes including physician time.      [name], ANP      [place] Stanford Breed, MD, [facility]  Electronically Signed    CB/MEDQ  D:  12/26/2007  T:  12/27/2007  Job:  [id number]

## 2010-10-03 NOTE — H&P (Signed)
[name], [name] NO.:  [id number]   MEDICAL RECORD NO.:  [id number]          PATIENT TYPE:  INP   LOCATION:  4765                         FACILITY:  [facility]   PHYSICIAN:  Carlena Bjornstad, MD, FACCDATE OF BIRTH:  [birth year]   DATE OF ADMISSION:  08/04/2007  DATE OF DISCHARGE:                              HISTORY & PHYSICAL   PRIMARY CARDIOLOGIST:  Denice Bors. Stanford Breed, MD, [facility].   PRIMARY CARE PHYSICIAN:  Rosalita Chessman, DO.   CARDIOVASCULAR SURGEON:  Valentina Gu. Roxy Manns, M.D.   HISTORY OF PRESENT ILLNESS:  This 60 year old Caucasian male with recent  hospitalization in February, 2009 for mitral valve repair with resection  of the posterior leaflet and angioplasty along with 3D ring  annuloplasty, per Dr. Roxy Manns, secondary to severe mitral regurgitation and  stenosis.  This was completed on June 29, 2007.  The patient did  follow up in [place] for Dr. Roxy Manns on March 6 for follow-up appointment and  had a chest x-ray revealing a small pleural effusion.  He was started on  Lasix 40 mg daily with potassium x2 weeks and was doing well until  approximately 24 hours ago.  The patient then experiencing increased  weakness, mild shortness of breath, and unable to stand during church as  a result.  The patient woke up this morning, was still mildly short of  breath but went on to his first appointment in cardiac rehab.  The  patient had a telemetry placed, and the patient was found to have a  rapid heart rate of 157 beats per minute.  EKG preliminary, revealed an  A fib with RVR, and the patient was brought to the emergency room for  further evaluation.  The patient had no complaints of chest pain.  He  did feel mild shortness of breath, which was better after having oxygen  placed.  He did complain of some mild lightheadedness, but he denies any  nausea, vomiting, diaphoresis, near syncope.   REVIEW OF SYSTEMS:  As above, otherwise negative.   PAST MEDICAL HISTORY:  1.  Severe MR, status post annuloplasty on June 29, 2007 per Dr.      Roxy Manns with a 3D ring placed.  This is a Memo 36 mm.  2. CAD, status post cardiac catheterization in February, 2009      revealing 40% ostial left main narrowing.  No flow-limiting CAD      otherwise.  3. Cerebrovascular disease with Doppler carotid studies revealing 40-      59% right internal carotid and 40-49% left internal carotid artery.  4. Hypertension.  5. Hyperlipidemia.   The most recent echocardiogram revealing LVEF of 45%.   PAST SURGICAL HISTORY:  Status post mitral valve repair in February,  2009 and tonsillectomy.   SOCIAL HISTORY:  Patient lives in [place] with his wife.  He is an  Chief Financial Officer.  He does not smoke.  He does not drink alcohol.   FAMILY HISTORY:  Mother with diabetes and COPD, BOOP.  The patient's  father is deceased after having liver cancer.  He is an only child.  CURRENT MEDICATIONS:  1. Aspirin 81 mg once daily.  2. Toprol XL 25 mg once daily.  3. Zocor 40 mg daily.  4. Oxycodone p.r.n.  5. Coumadin 5 mg daily.  6. Multivitamin daily.   ALLERGIES:  No known drug allergies.  He is allergic to ADHESIVE TAPE.   PHYSICAL EXAMINATION:  Blood pressure 123/56, pulse 153, respirations  16, O2 sat 100% on 2 liters.  GENERAL:  He is awake, alert and oriented in no acute distress.  HEENT:  Head is normocephalic and atraumatic.  Eyes:  PERRLA.  Mucous  membranes, mouth are pink and moist.  Tongue is midline.  NECK:  Supple.  There is no JVD.  There are soft carotid bruits noted on  the left.  CARDIOVASCULAR:  Tachycardic without murmurs, rubs or gallops.  Pulses  are 2+ and equal bilaterally in dorsalis pedis, posterior tibial, and  radial pulses.  LUNGS:  Clear to auscultation without wheezes, rales or rhonchi.  ABDOMEN:  Soft, nontender.  Bowel sounds 2+.  EXTREMITIES:  Without clubbing, cyanosis or edema.  NEURO:  Cranial nerves II-XII are grossly intact.  SKIN:  Warm and dry.    Chest x-ray is pending.   EKG reveals sinus tachycardia with a ventricular rate of 150 beats per  minute.   Hemoglobin 13.1, hematocrit 39.3, white blood cells 13.6, platelets 257.  PTT 48, PT 26, INR 2.3.   IMPRESSION:  1. Status post mitral valve repair.  2. Minimal coronary artery disease.  3. Supraventricular tachycardia noted today at rehab.  The patient was      given Adenosine 6 mg and saw atrial flutter instead of atrial      fibrillation.  Patient will be placed on Cardizem drip after 5 mg      bolus at 10 mg/hr for titration for heart rate control.  Review of      his operative report reveals left atrial appendage was over-sewn      from within the left atrium.  The patient has been advised that he      will need to be admitted and will possibly have TEE cardioversion      if he does not convert on a Cardizem drip.  He verbalizes      understanding.  Patient will      have a follow-up echocardiogram when and if he returns to normal      sinus rhythm.  This has also been explained to the patient, who      verbalizes understanding and is willing to be admitted and proceed      with further treatment.      Phill Myron. Purcell Nails, NP      Carlena Bjornstad, MD, [facility]  Electronically Signed    KML/MEDQ  D:  08/04/2007  T:  08/04/2007  Job:  [id number]   cc:   Rosalita Chessman, DO  Valentina Gu Roxy Manns, M.D.

## 2010-10-03 NOTE — Assessment & Plan Note (Signed)
[place] OFFICE NOTE   MANCIL, PFENNING                      MRN:          [id number]  DATE:08/22/2007                            DOB:          May 02, 1951    HISTORY:  Mr. Dalto returns for followup today.  He is a gentleman  that recently underwent mitral valve repair.  However, he was found at  cardiac rehab to be in atrial flutter and admitted on August 04, 2007.  He ultimately underwent atrial flutter ablation successfully.  However  during that admission, he also was found to have fevers with a  temperature up to 102.  He did have blood cultures drawn which were  ultimately negative.  He had a transesophageal echocardiogram performed  as well.  This showed an ejection fraction of 50-55%.  There was a  normally functioning mitral valve repair with trivial mitral  regurgitation.  There were no vegetations noted.  He also had a chest CT  that showed no abscess.  It was ultimately felt that this may have been  related to a drug reaction.  Note, his liver functions were mildly  elevated and his Zocor was discontinued.  He was treated with low-dose  Lasix for pleural effusions and dyspnea.  Since discharge, he has done  well.  There is no dyspnea, chest pain, palpitations or syncope.  He has  not had any recurrent fevers.  His wife has been checking these at home.   MEDICATIONS:  1. Aspirin 81 mg p.o. daily.  2. Coumadin as directed.  3. Lasix 20 mg p.o. daily.   PHYSICAL EXAMINATION:  VITAL SIGNS:  Blood pressure of 120/84, pulse is  84, he weighs 199 pounds.  HEENT:  Normal.  NECK:  Supple.  CHEST:  Clear.  CARDIOVASCULAR:  Regular rhythm.  There is no murmur noted.  ABDOMEN:  Shows no tenderness.  EXTREMITIES:  Show no edema.  There is no peripheral stigmata of SBE.   DIAGNOSTICS:  His electrocardiogram shows sinus rhythm at a rate of 86.  There is first-degree AV block.  There is no RV  conduction delay.  There  are no ST changes noted.   DIAGNOSES:  1. Status post mitral valve repair - Mr. Seeling is doing well from a      symptomatic standpoint.  He states his dyspnea has improved.  I      have asked him to change his Lasix to 20 mg p.o. daily as needed.      Note, he did have recent fevers, but we felt this was most likely      reaction to medications.  His blood cultures were negative and his      transesophageal echocardiogram showed no vegetations.  He will need      to continue with SBE prophylaxis.  2. History of cerebrovascular disease - he will continue on his      aspirin.  I have discontinued his Zocor as his liver functions are      mildly elevated.  We will consider another statin in the future.  3. Coumadin therapy - we will plan to continue this for 3 more months.      If he remains in sinus when I see him back, then we will plan to      discontinue this.  4. Coronary artery disease - he will continue on his aspirin and we      will consider another statin in the future once his liver functions      have normalized.  5. Hypertension - his blood pressure is adequately controlled.  6. Hyperlipidemia - as per above.   FOLLOW UP:  I will see him back in 3 months.  Again, we will discontinue  his Coumadin at that time if he remains in sinus rhythm.     Denice Bors Stanford Breed, MD, [facility]  Electronically Signed    BSC/MedQ  DD: 08/22/2007  DT: 08/22/2007  Job #: [id number]

## 2010-10-03 NOTE — Assessment & Plan Note (Signed)
[place] OFFICE NOTE   BRAXTEN, MEMMER                      MRN:          [id number]  DATE:10/06/2007                            DOB:          1951-05-17    Mr. Cocuzza is a gentleman who is status post mitral valve repair  July 01, 2007.  In March, he was admitted to the hospital with  atrial flutter and underwent ablation.  During that admission, he was  having intermittent fevers.  A transesophageal echocardiogram was  performed, and the mitral valve repair was functioning normally with no  vegetations.  A CT of his abdomen showed no abscess.  I last saw him on  August 22, 2007.  He has done well since then.  However, over the past 3-4  days, he has noticed pain in the left chest area.  It increases with  inspiration.  He has also noticed some dyspnea with exertion as well as  dyspnea with lying flat.  There is no PND or pedal edema.  He does not  have exertional chest pain.  Note:  On Oct 04, 2007, he also noticed  some fever with a T-max of 101.3.  There was also some sweating, but  there were no chills.  He denies any productive cough, hemoptysis,  rhinorrhea, sore throat, nausea or diarrhea.  There is no dysuria.  There are no rashes.  Because of the above, he asked to be seen today.   MEDICATIONS:  1. Aspirin 81 mg daily.  2. Coumadin as directed.   PHYSICAL EXAMINATION:  VITAL SIGNS:  Exam today shows a blood pressure  of 111/75.  His pulse is 81.  Temperature is 98.5.  HEENT:  Normal.  NECK:  Supple.  CHEST:  Shows diminished breath sounds in the left lower lobe.  CARDIOVASCULAR:  Exam reveals a regular rhythm.  There are no murmurs  noted.  ABDOMEN:  Exam shows no tenderness.  EXTREMITIES:  Show no edema.  SKIN:  Shows no rash.  I cannot appreciate splinter hemorrhages or  Janeway lesions.  There are no peripheral nodules.   Electrocardiogram today shows sinus rhythm at a rate of 82.   There is a  first-degree AV block.  There is subtle ST elevation diffusely.   DIAGNOSES:  1. Fever of uncertain etiology.  Mr. Gatliff is afebrile today.  He      does not have any localizing signs or symptoms of infection      including no peripheral signs of subacute bacterial endocarditis.      He did have a transesophageal echocardiogram recently that showed      no vegetation.  We will plan to check a CBC today as well as blood      cultures x2.  I will also check a urinalysis as well as a urine      culture.  He will continue to track his temperature at home and      contact us if  it increases further.  It appears to have resolved      at this point.  Note:  His chest x-ray shows no pneumonia, but      there is a left pleural effusion.  2. Dyspnea:  This appears to be related to a left pleural effusion.  I      have considered pulmonary embolus, but I think this is unlikely as      he is on Coumadin.  We will resume his Lasix.  I will give him 40      mg today, and then we will continue with 20 mg p.o. daily.  I will      check a BMET in 1 week and follow his potassium and renal function.      Note:  His LV function is preserved.  3. History of cerebrovascular disease:  He will continue on his      aspirin.  We have previously discontinued his Zocor as his liver      functions were mildly elevated.  We may consider Pravachol in the      future after his acute illness resolves.  4. Coumadin therapy:  He will continue his for 3 months.  If he      remains in sinus rhythm in July,  I will discontinue this.  5. Coronary artery disease.  He will continue on his aspirin, and we      will consider a statin as described above.  6. Hypertension.  His blood pressure is adequately controlled.  7. Hyperlipidemia:  We will resume his statin in the near future.   We will see him back in 2 weeks.     Denice Bors Stanford Breed, MD, [facility]  Electronically Signed    BSC/MedQ  DD: 10/06/2007  DT:  10/06/2007  Job #: [id number]

## 2010-11-27 ENCOUNTER — Encounter: Payer: Self-pay | Admitting: Cardiology

## 2011-01-04 ENCOUNTER — Encounter: Payer: Self-pay | Admitting: Cardiology

## 2011-01-04 ENCOUNTER — Ambulatory Visit (INDEPENDENT_AMBULATORY_CARE_PROVIDER_SITE_OTHER): Payer: Self-pay | Admitting: Cardiology

## 2011-01-04 DIAGNOSIS — Z9889 Other specified postprocedural states: Secondary | ICD-10-CM

## 2011-01-04 DIAGNOSIS — I679 Cerebrovascular disease, unspecified: Secondary | ICD-10-CM

## 2011-01-04 DIAGNOSIS — I2510 Atherosclerotic heart disease of native coronary artery without angina pectoris: Secondary | ICD-10-CM

## 2011-01-04 DIAGNOSIS — R0600 Dyspnea, unspecified: Secondary | ICD-10-CM

## 2011-01-04 DIAGNOSIS — E7800 Pure hypercholesterolemia, unspecified: Secondary | ICD-10-CM

## 2011-01-04 DIAGNOSIS — R0989 Other specified symptoms and signs involving the circulatory and respiratory systems: Secondary | ICD-10-CM

## 2011-01-04 NOTE — Assessment & Plan Note (Signed)
Patient is status post mitral valve repair. He is complaining of mild dyspnea on exertion. Repeat echocardiogram. Check BNP. Continue SBE prophylaxis.

## 2011-01-04 NOTE — Assessment & Plan Note (Signed)
Last carotid Dopplers were normal.

## 2011-01-04 NOTE — Assessment & Plan Note (Signed)
Continue diet. We cannot use statins as these have caused increased liver functions previously.

## 2011-01-04 NOTE — Progress Notes (Signed)
HPI: [name] is a pleasant gentleman who has a history of mitral valve repair secondary to severe mitral regurgitation in February 2009.  He has also had atrial flutter ablation in March 2009.  Postoperatively, he had recurrent fevers that were felt secondary to a postsurgical inflammatory state. Carotid dopplers in August of 2011 were normal. Echo in August of 2011 showed normal LV function, moderate LAE and mild MR. Patient has had elevated LFTs with statins in the past. I last saw him in August of 2011. Since then, the patient has dyspnea with more extreme activities but not with routine activities. It is relieved with rest. It is not associated with chest pain. There is no orthopnea, PND or pedal edema. There is no syncope or palpitations. There is no exertional chest pain.   Current Outpatient Prescriptions  Medication Sig Dispense Refill  . aspirin EC 81 MG tablet Take 81 mg by mouth daily.        Marland Kitchen glucosamine-chondroitin 500-400 MG tablet Take 2 tablets by mouth.        . Multiple Vitamins-Minerals (CENTRUM SILVER PO) Take 1 tablet by mouth daily.        Marland Kitchen omega-3 acid ethyl esters (LOVAZA) 1 G capsule Take 2 g by mouth 2 (two) times daily.        . Saw Palmetto, Serenoa repens, (SAW PALMETTO PO) Take 2 tablets by mouth daily.          Past Medical History  Diagnosis Date  . MVP (mitral valve prolapse)   . MR (mitral regurgitation)     severe; mitral valve repair in march 2009  . Atrial flutter     s/p ablation  . HTN (hypertension)   . Hyperlipidemia   . CAD (coronary artery disease)     cath 06/2007 40% LM  . CVD (cerebrovascular disease)     59% right ICA, 49% left ICA; h/oTIA  . History of colonoscopy     Past Surgical History  Procedure Date  . Tonsillectomy 1960  . Mitral valve repair 06/2007    History   Social History  . Marital Status: Married    Spouse Name: N/A    Number of Children: N/A  . Years of Education: N/A   Occupational History  . 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 Smoking status: Never Smoker   . Smokeless tobacco: Not on file  . Alcohol Use: Yes     occasional  . Drug Use: No  . Sexually Active: Not on file   Other Topics Concern  . Not on file   Social History Narrative  . No narrative on file    ROS: no fevers or chills, productive cough, hemoptysis, dysphasia, odynophagia, melena, hematochezia, dysuria, hematuria, rash, seizure activity, orthopnea, PND, pedal edema, claudication. Remaining systems are negative.  Physical Exam: Well-developed well-nourished in no acute distress.  Skin is warm and dry.  HEENT is normal.  Neck is supple. No thyromegaly.  Chest is clear to auscultation with normal expansion.  Cardiovascular exam is regular rate and rhythm.  Abdominal exam nontender or distended. No masses palpated. Extremities show no edema. neuro grossly intact  ECG NSR with first degree AV block, RVCD, no ST changes

## 2011-01-04 NOTE — Assessment & Plan Note (Signed)
Continue aspirin 

## 2011-01-04 NOTE — Patient Instructions (Signed)
Your physician wants you to follow-up in: [place] will receive a reminder letter in the mail two months in advance. If you don't receive a letter, please call our office to schedule the follow-up appointment.   Your physician has requested that you have an echocardiogram. Echocardiography is a painless test that uses sound waves to create images of your heart. It provides your doctor with information about the size and shape of your heart and how well your heart's chambers and valves are working. This procedure takes approximately one hour. There are no restrictions for this procedure.   Your physician recommends that you return for lab work in: [place]

## 2011-01-08 ENCOUNTER — Ambulatory Visit (HOSPITAL_COMMUNITY): Payer: 59 | Attending: Cardiology | Admitting: Radiology

## 2011-01-08 DIAGNOSIS — R0989 Other specified symptoms and signs involving the circulatory and respiratory systems: Secondary | ICD-10-CM | POA: Insufficient documentation

## 2011-01-08 DIAGNOSIS — I080 Rheumatic disorders of both mitral and aortic valves: Secondary | ICD-10-CM | POA: Insufficient documentation

## 2011-01-08 DIAGNOSIS — I079 Rheumatic tricuspid valve disease, unspecified: Secondary | ICD-10-CM | POA: Insufficient documentation

## 2011-01-08 DIAGNOSIS — R0609 Other forms of dyspnea: Secondary | ICD-10-CM | POA: Insufficient documentation

## 2011-01-08 DIAGNOSIS — Z9889 Other specified postprocedural states: Secondary | ICD-10-CM

## 2011-01-08 DIAGNOSIS — E785 Hyperlipidemia, unspecified: Secondary | ICD-10-CM | POA: Insufficient documentation

## 2011-01-08 DIAGNOSIS — R0600 Dyspnea, unspecified: Secondary | ICD-10-CM

## 2011-01-08 DIAGNOSIS — R0602 Shortness of breath: Secondary | ICD-10-CM

## 2011-01-08 DIAGNOSIS — I2510 Atherosclerotic heart disease of native coronary artery without angina pectoris: Secondary | ICD-10-CM | POA: Insufficient documentation

## 2011-02-08 LAB — COMPREHENSIVE METABOLIC PANEL
Alkaline Phosphatase: 53
BUN: 17
CO2: 21
Chloride: 108
Glucose, Bld: 91
Potassium: 4.1
Total Bilirubin: 0.9

## 2011-02-08 LAB — CBC
HCT: 40.6
Hemoglobin: 14
WBC: 5

## 2011-02-08 LAB — URINALYSIS, ROUTINE W REFLEX MICROSCOPIC
Bilirubin Urine: NEGATIVE
Hgb urine dipstick: NEGATIVE
Nitrite: NEGATIVE
Specific Gravity, Urine: 1.016
pH: 7

## 2011-02-08 LAB — BLOOD GAS, ARTERIAL
Acid-base deficit: 0.9
Bicarbonate: 22.5
TCO2: 23.4
pCO2 arterial: 31.9 — ABNORMAL LOW
pO2, Arterial: 115 — ABNORMAL HIGH

## 2011-02-08 LAB — TYPE AND SCREEN: Antibody Screen: NEGATIVE

## 2011-02-08 LAB — HEMOGLOBIN A1C
Hgb A1c MFr Bld: 5.3
Mean Plasma Glucose: 111

## 2011-02-08 LAB — ABO/RH: ABO/RH(D): O POS

## 2011-02-09 LAB — POCT I-STAT 3, ART BLOOD GAS (G3+)
Acid-base deficit: 4 — ABNORMAL HIGH
Acid-base deficit: 9 — ABNORMAL HIGH
O2 Saturation: 100
O2 Saturation: 92
O2 Saturation: 99
TCO2: 14
TCO2: 23
pCO2 arterial: 19.2 — CL
pCO2 arterial: 41.1
pCO2 arterial: 49.8 — ABNORMAL HIGH
pO2, Arterial: 141 — ABNORMAL HIGH

## 2011-02-09 LAB — BASIC METABOLIC PANEL
BUN: 17
CO2: 24
CO2: 27
Chloride: 102
Chloride: 109
Chloride: 98
GFR calc Af Amer: >60
GFR calc non Af Amer: >60
GFR calc non Af Amer: >60
Glucose, Bld: 106 — ABNORMAL HIGH
Glucose, Bld: 117 — ABNORMAL HIGH
Glucose, Bld: 139 — ABNORMAL HIGH
Potassium: 3.7
Potassium: 4
Sodium: 131 — ABNORMAL LOW
Sodium: 137
Sodium: 137

## 2011-02-09 LAB — PROTIME-INR
Prothrombin Time: 17.5 — ABNORMAL HIGH
Prothrombin Time: 20.7 — ABNORMAL HIGH

## 2011-02-09 LAB — CBC
HCT: 30.6 — ABNORMAL LOW
HCT: 31 — ABNORMAL LOW
HCT: 37 — ABNORMAL LOW
Hemoglobin: 10.6 — ABNORMAL LOW
Hemoglobin: 10.6 — ABNORMAL LOW
Hemoglobin: 12.1 — ABNORMAL LOW
Hemoglobin: 12.7 — ABNORMAL LOW
MCHC: 34
MCHC: 34.3
MCHC: 34.4
MCHC: 34.5
MCV: 91.1
MCV: 91.5
MCV: 91.9
MCV: 92.2
RBC: 3.33 — ABNORMAL LOW
RBC: 3.86 — ABNORMAL LOW
RBC: 4.06 — ABNORMAL LOW
RDW: 12.7
RDW: 12.7
RDW: 12.8
WBC: 16.3 — ABNORMAL HIGH

## 2011-02-09 LAB — I-STAT EC8
Acid-base deficit: 3 — ABNORMAL HIGH
Bicarbonate: 19.4 — ABNORMAL LOW
Glucose, Bld: 150 — ABNORMAL HIGH
Hemoglobin: 11.2 — ABNORMAL LOW
Potassium: 3.7
Sodium: 140
TCO2: 20
pH, Arterial: 7.488 — ABNORMAL HIGH

## 2011-02-09 LAB — POCT I-STAT 4, (NA,K, GLUC, HGB,HCT)
Glucose, Bld: 114 — ABNORMAL HIGH
Glucose, Bld: 125 — ABNORMAL HIGH
HCT: 21 — ABNORMAL LOW
HCT: 25 — ABNORMAL LOW
HCT: 35 — ABNORMAL LOW
Hemoglobin: 11.9 — ABNORMAL LOW
Hemoglobin: 12.9 — ABNORMAL LOW
Hemoglobin: 7.1 — CL
Hemoglobin: 8.5 — ABNORMAL LOW
Operator id: 299391
Operator id: 3342
Operator id: 3406
Potassium: 3.9
Potassium: 4
Potassium: 5.6 — ABNORMAL HIGH
Sodium: 133 — ABNORMAL LOW
Sodium: 135
Sodium: 137
Sodium: 138

## 2011-02-09 LAB — POCT I-STAT GLUCOSE
Glucose, Bld: 129 — ABNORMAL HIGH
Operator id: 3406
Operator id: 3406

## 2011-02-09 LAB — HEMOGLOBIN AND HEMATOCRIT, BLOOD: HCT: 22.5 — ABNORMAL LOW

## 2011-02-09 LAB — CREATININE, SERUM: Creatinine, Ser: 1.01

## 2011-02-09 LAB — B-NATRIURETIC PEPTIDE (CONVERTED LAB): Pro B Natriuretic peptide (BNP): 384 — ABNORMAL HIGH

## 2011-02-12 LAB — DIFFERENTIAL
Basophils Absolute: 0
Basophils Absolute: 0
Basophils Absolute: 0.1
Basophils Relative: 0
Eosinophils Absolute: 0.8 — ABNORMAL HIGH
Eosinophils Relative: 11 — ABNORMAL HIGH
Eosinophils Relative: 5
Lymphocytes Relative: 14
Lymphocytes Relative: 15
Lymphocytes Relative: 15
Lymphocytes Relative: 18
Lymphs Abs: 1.1
Lymphs Abs: 1.1
Lymphs Abs: 1.9
Monocytes Absolute: 0.7
Monocytes Absolute: 0.7
Monocytes Absolute: 1.3 — ABNORMAL HIGH
Monocytes Relative: 10
Monocytes Relative: 10
Monocytes Relative: 11
Neutro Abs: 3.6
Neutro Abs: 4.9
Neutro Abs: 7.5
Neutrophils Relative %: 64

## 2011-02-12 LAB — CBC
HCT: 36.9 — ABNORMAL LOW
HCT: 37.5 — ABNORMAL LOW
HCT: 39.3
Hemoglobin: 11.9 — ABNORMAL LOW
Hemoglobin: 12.5 — ABNORMAL LOW
Hemoglobin: 12.6 — ABNORMAL LOW
Hemoglobin: 12.7 — ABNORMAL LOW
MCHC: 33.6
MCV: 88.7
Platelets: 222
Platelets: 240
Platelets: 257
RBC: 3.98 — ABNORMAL LOW
RBC: 4.23
RBC: 4.4
RDW: 13.1
RDW: 13.3
RDW: 13.5
RDW: 13.6
WBC: 13.6 — ABNORMAL HIGH
WBC: 7.6

## 2011-02-12 LAB — BASIC METABOLIC PANEL
BUN: 12
BUN: 12
Calcium: 8.6
Chloride: 105
GFR calc non Af Amer: >60
Glucose, Bld: 106 — ABNORMAL HIGH
Potassium: 3.9
Potassium: 4.4

## 2011-02-12 LAB — COMPREHENSIVE METABOLIC PANEL
ALT: 65 — ABNORMAL HIGH
AST: 30
AST: 45 — ABNORMAL HIGH
Albumin: 3.3 — ABNORMAL LOW
Albumin: 3.6
Alkaline Phosphatase: 71
BUN: 12
CO2: 25
Calcium: 8.6
Calcium: 9.1
Chloride: 103
Creatinine, Ser: 0.95
Creatinine, Ser: 0.98
GFR calc Af Amer: >60
GFR calc Af Amer: >60
GFR calc non Af Amer: >60
Glucose, Bld: 115 — ABNORMAL HIGH
Potassium: 4.1
Sodium: 137
Sodium: 137
Total Bilirubin: 0.5
Total Protein: 6.5

## 2011-02-12 LAB — EPSTEIN-BARR VIRUS VCA ANTIBODY PANEL
EBV EA IgG: 1.46 — ABNORMAL HIGH
EBV NA IgG: 5.69 — ABNORMAL HIGH
EBV VCA IgG: 2.46 — ABNORMAL HIGH
EBV VCA IgM: 0.15

## 2011-02-12 LAB — CULTURE, BLOOD (ROUTINE X 2): Culture: NO GROWTH

## 2011-02-12 LAB — PROTIME-INR
INR: 1.9 — ABNORMAL HIGH
INR: 1.9 — ABNORMAL HIGH
INR: 2.3 — ABNORMAL HIGH
Prothrombin Time: 22.6 — ABNORMAL HIGH
Prothrombin Time: 22.6 — ABNORMAL HIGH
Prothrombin Time: 25.1 — ABNORMAL HIGH
Prothrombin Time: 26.4 — ABNORMAL HIGH

## 2011-02-12 LAB — CK TOTAL AND CKMB (NOT AT ARMC)
CK, MB: 1.5
Relative Index: INVALID
Relative Index: INVALID
Total CK: 62

## 2011-02-12 LAB — URINALYSIS, ROUTINE W REFLEX MICROSCOPIC
Glucose, UA: NEGATIVE
Ketones, ur: NEGATIVE
Protein, ur: NEGATIVE

## 2011-02-12 LAB — PROTEIN ELECTROPH W RFLX QUANT IMMUNOGLOBULINS
Beta 2: 6.4
Gamma Globulin: 12.5
M-Spike, %: NOT DETECTED

## 2011-02-12 LAB — LACTATE DEHYDROGENASE: LDH: 214

## 2011-02-12 LAB — TROPONIN I: Troponin I: 0.01

## 2011-02-12 LAB — IGG: IgG (Immunoglobin G), Serum: 865

## 2011-02-12 LAB — HEPATITIS B SURFACE ANTIGEN: Hepatitis B Surface Ag: NEGATIVE

## 2011-02-12 LAB — CARDIAC PANEL(CRET KIN+CKTOT+MB+TROPI)
CK, MB: 1.2
Relative Index: INVALID
Total CK: 73
Troponin I: <0.01

## 2011-02-12 LAB — SEDIMENTATION RATE: Sed Rate: 30 — ABNORMAL HIGH

## 2011-02-12 LAB — ANTI-NEUTROPHIL ANTIBODY: Cytoplasmic Neutrophilic Ab: <1:20 {titer}

## 2011-02-12 LAB — CYTOMEGALOVIRUS ANTIBODY, IGG: Cytomegalovirus(CMV) Antibody, IgG: NEGATIVE

## 2011-02-12 LAB — TSH: TSH: 0.891

## 2011-02-12 LAB — RENAL FUNCTION PANEL
Albumin: 3.4 — ABNORMAL LOW
CO2: 25
Calcium: 8.6
GFR calc Af Amer: >60
GFR calc non Af Amer: >60
Phosphorus: 3.5
Sodium: 137

## 2011-02-12 LAB — HEPATITIS A ANTIBODY, TOTAL: Hep A Total Ab: POSITIVE — AB

## 2011-02-12 LAB — ANA: Anti Nuclear Antibody(ANA): NEGATIVE

## 2011-02-12 LAB — IGM: IgM, Serum: 158

## 2011-02-15 LAB — DIFFERENTIAL
Eosinophils Relative: 1
Lymphocytes Relative: 11 — ABNORMAL LOW
Lymphs Abs: 1.3
Monocytes Absolute: 1.1 — ABNORMAL HIGH

## 2011-02-15 LAB — CBC
MCHC: 33.4
MCHC: 33.5
MCV: 87.6
MCV: 88
Platelets: 191
Platelets: 214
RDW: 14.5

## 2011-02-15 LAB — BASIC METABOLIC PANEL
Calcium: 8.7
GFR calc Af Amer: >60
GFR calc non Af Amer: >60
Glucose, Bld: 114 — ABNORMAL HIGH
Potassium: 3.9
Sodium: 136

## 2011-02-15 LAB — CULTURE, BLOOD (ROUTINE X 2): Culture: NO GROWTH

## 2011-02-15 LAB — H1N1 SCREEN (PCR): H1N1 Virus Scrn: NOT DETECTED

## 2011-02-15 LAB — URINALYSIS, ROUTINE W REFLEX MICROSCOPIC
Hgb urine dipstick: NEGATIVE
Nitrite: NEGATIVE
Specific Gravity, Urine: 1.005
Urobilinogen, UA: 0.2

## 2011-02-15 LAB — HEPATIC FUNCTION PANEL
Albumin: 3.2 — ABNORMAL LOW
Indirect Bilirubin: 0.5
Total Protein: 6.8

## 2011-02-15 LAB — COMPREHENSIVE METABOLIC PANEL
AST: 48 — ABNORMAL HIGH
Albumin: 3.5
Calcium: 8.8
Creatinine, Ser: 0.87
GFR calc Af Amer: >60

## 2011-02-15 LAB — Q FEVER ABS IGG, IGM W/ REFLEX TITER: Q Fever Phase I: <1:16 {titer}

## 2011-02-15 LAB — COXSACKIE A VIRUS ANTIBODIES: Coxsackie A Sero 9: 1:64 {titer} — ABNORMAL HIGH

## 2011-02-15 LAB — BARTONELLA ANTIBODY PANEL
B henselae IgG: <1:64 {titer}
B henselae IgM: <1:16 {titer}

## 2011-02-15 LAB — COXSACKIE B VIRUS ANTIBODIES
Coxsackie B1 Ab: <1:8 {titer}
Coxsackie B3 Ab: <1:8 {titer}
Coxsackie B4 Ab: <1:8 {titer}
Coxsackie B5 Ab: 1:16 {titer} — ABNORMAL HIGH

## 2011-02-15 LAB — D-DIMER, QUANTITATIVE: D-Dimer, Quant: 1.87 — ABNORMAL HIGH

## 2011-02-15 LAB — BRUCELLA IGG, IGM: Brucella Ab: <1:80 {titer}

## 2011-02-23 LAB — POCT I-STAT 3, VENOUS BLOOD GAS (G3P V)
Bicarbonate: 25.2 — ABNORMAL HIGH
O2 Saturation: 75
O2 Saturation: 75
TCO2: 26
TCO2: 27
pCO2, Ven: 42.3 — ABNORMAL LOW
pCO2, Ven: 42.4 — ABNORMAL LOW
pO2, Ven: 41

## 2011-02-23 LAB — POCT I-STAT 3, ART BLOOD GAS (G3+)
Bicarbonate: 24.7 — ABNORMAL HIGH
Operator id: 141321
pCO2 arterial: 39
pH, Arterial: 7.409
pO2, Arterial: 83

## 2011-12-04 ENCOUNTER — Telehealth: Payer: Self-pay | Admitting: Family Medicine

## 2011-12-04 NOTE — Telephone Encounter (Signed)
Refill: Lovaza 1gm capsule. Take 2 capsules by mouth twice a day. Qty 120. Last fill 05-31-11

## 2011-12-04 NOTE — Telephone Encounter (Addendum)
Last seen 06/05/10. Please advise    KP

## 2011-12-04 NOTE — Telephone Encounter (Signed)
Pt needs ov with labs-- can refill after appointment made

## 2011-12-05 MED ORDER — OMEGA-3-ACID ETHYL ESTERS 1 G PO CAPS: 2.0000 g | ORAL_CAPSULE | Freq: Two times a day (BID) | ORAL | Status: DC

## 2011-12-05 NOTE — Telephone Encounter (Signed)
Made appt  7.18.13 11:30am used a same day to accommodate pt. Scheduled - advised a 15-minute visit jut for medication renewal pt. Understood Very nice and pleasant to speak with

## 2011-12-05 NOTE — Telephone Encounter (Signed)
Rx faxed.    KP 

## 2011-12-05 NOTE — Telephone Encounter (Signed)
Please offer this patient an apt, if he has question or concerns--transfer the call to me. Thank You    KP

## 2011-12-06 ENCOUNTER — Encounter: Payer: Self-pay | Admitting: Family Medicine

## 2011-12-06 ENCOUNTER — Ambulatory Visit (INDEPENDENT_AMBULATORY_CARE_PROVIDER_SITE_OTHER): Payer: 59 | Admitting: Family Medicine

## 2011-12-06 VITALS — BP 118/80 | HR 67 | Temp 98.3°F | Wt 211.0 lb

## 2011-12-06 DIAGNOSIS — M1710 Unilateral primary osteoarthritis, unspecified knee: Secondary | ICD-10-CM

## 2011-12-06 DIAGNOSIS — E7800 Pure hypercholesterolemia, unspecified: Secondary | ICD-10-CM

## 2011-12-06 DIAGNOSIS — E785 Hyperlipidemia, unspecified: Secondary | ICD-10-CM

## 2011-12-06 DIAGNOSIS — IMO0002 Reserved for concepts with insufficient information to code with codable children: Secondary | ICD-10-CM

## 2011-12-06 DIAGNOSIS — E781 Pure hyperglyceridemia: Secondary | ICD-10-CM

## 2011-12-06 LAB — CBC WITH DIFFERENTIAL/PLATELET
Eosinophils Relative: 2.6 % (ref 0.0–5.0)
HCT: 45 % (ref 39.0–52.0)
Lymphs Abs: 1.9 10*3/uL (ref 0.7–4.0)
Monocytes Relative: 9.6 % (ref 3.0–12.0)
Neutrophils Relative %: 55 % (ref 43.0–77.0)
Platelets: 179 10*3/uL (ref 150.0–400.0)
RBC: 4.84 Mil/uL (ref 4.22–5.81)
WBC: 5.9 10*3/uL (ref 4.5–10.5)

## 2011-12-06 LAB — POCT URINALYSIS DIPSTICK
Bilirubin, UA: NEGATIVE
Glucose, UA: NEGATIVE
Leukocytes, UA: NEGATIVE
Nitrite, UA: NEGATIVE

## 2011-12-06 LAB — BASIC METABOLIC PANEL
BUN: 17 mg/dL (ref 6–23)
GFR: 82.35 mL/min (ref >60.00)
Potassium: 4 mEq/L (ref 3.5–5.1)
Sodium: 139 mEq/L (ref 135–145)

## 2011-12-06 LAB — HEPATIC FUNCTION PANEL
ALT: 66 U/L — ABNORMAL HIGH (ref 0–53)
AST: 34 U/L (ref 0–37)
Bilirubin, Direct: 0.1 mg/dL (ref 0.0–0.3)
Total Bilirubin: 1 mg/dL (ref 0.3–1.2)

## 2011-12-06 LAB — LIPID PANEL: Total CHOL/HDL Ratio: 5

## 2011-12-06 LAB — TSH: TSH: 1.4 u[IU]/mL (ref 0.35–5.50)

## 2011-12-06 LAB — LDL CHOLESTEROL, DIRECT: Direct LDL: 199.9 mg/dL

## 2011-12-06 MED ORDER — OMEGA-3-ACID ETHYL ESTERS 1 G PO CAPS: 2.0000 g | ORAL_CAPSULE | Freq: Two times a day (BID) | ORAL | Status: DC

## 2011-12-06 MED ORDER — MELOXICAM 15 MG PO TABS: 15.0000 mg | ORAL_TABLET | Freq: Every day | ORAL | Status: DC

## 2011-12-06 NOTE — Assessment & Plan Note (Signed)
Refill mobic Knee sleeve F/u ortho if no improvement

## 2011-12-06 NOTE — Progress Notes (Signed)
  Subjective:    Patient ID: [name], male    DOB: 10-Apr-1951, 61 y.o.   MRN: [id number]  HPI Pt here c/o foot no better.  See last ov.  Pt still having difficulty with walking and is using a cane.     Review of Systems As above    Objective:   Physical Exam  Constitutional: He is oriented to person, place, and time. He appears well-developed and well-nourished.  Neck: Normal range of motion. Neck supple.  Cardiovascular: Normal rate, regular rhythm and normal heart sounds.   No murmur heard. Pulmonary/Chest: Effort normal and breath sounds normal. No respiratory distress. He has no wheezes. He has no rales.  Musculoskeletal: Normal range of motion. He exhibits no edema.  Neurological: He is alert and oriented to person, place, and time.  Psychiatric: He has a normal mood and affect. His behavior is normal. Judgment and thought content normal.          Assessment & Plan:

## 2011-12-06 NOTE — Patient Instructions (Addendum)
Cholesterol Cholesterol is a white, waxy, fat-like protein needed by your body in small amounts. The liver makes all the cholesterol you need. It is carried from the liver by the blood through the blood vessels. Deposits (plaque) may build up on blood vessel walls. This makes the arteries narrower and stiffer. Plaque increases the risk for heart attack and stroke. You cannot feel your cholesterol level even if it is very high. The only way to know is by a blood test to check your lipid (fats) levels. Once you know your cholesterol levels, you should keep a record of the test results. Work with your caregiver to to keep your levels in the desired range. WHAT THE RESULTS MEAN:  Total cholesterol is a rough measure of all the cholesterol in your blood.   LDL is the so-called bad cholesterol. This is the type that deposits cholesterol in the walls of the arteries. You want this level to be low.   HDL is the good cholesterol because it cleans the arteries and carries the LDL away. You want this level to be high.   Triglycerides are fat that the body can either burn for energy or store. High levels are closely linked to heart disease.  DESIRED LEVELS:  Total cholesterol below 200.   LDL below 100 for people at risk, below 70 for very high risk.   HDL above 50 is good, above 60 is best.   Triglycerides below 150.  HOW TO LOWER YOUR CHOLESTEROL:  Diet.   Choose fish or white meat chicken and [name], roasted or baked. Limit fatty cuts of red meat, fried foods, and processed meats, such as sausage and lunch meat.   Eat lots of fresh fruits and vegetables. Choose whole grains, beans, pasta, potatoes and cereals.   Use only small amounts of olive, corn or canola oils. Avoid butter, mayonnaise, shortening or palm kernel oils. Avoid foods with trans-fats.   Use skim/nonfat milk and low-fat/nonfat yogurt and cheeses. Avoid whole milk, cream, ice cream, egg yolks and cheeses. Healthy desserts include  angel food cake, ginger snaps, animal crackers, hard candy, popsicles, and low-fat/nonfat frozen yogurt. Avoid pastries, cakes, pies and cookies.   Exercise.   A regular program helps decrease LDL and raises HDL.   Helps with weight control.   Do things that increase your activity level like gardening, walking, or taking the stairs.   Medication.   May be prescribed by your caregiver to help lowering cholesterol and the risk for heart disease.   You may need medicine even if your levels are normal if you have several risk factors.  HOME CARE INSTRUCTIONS   Follow your diet and exercise programs as suggested by your caregiver.   Take medications as directed.   Have blood work done when your caregiver feels it is necessary.  MAKE SURE YOU:   Understand these instructions.   Will watch your condition.   Will get help right away if you are not doing well or get worse.  Document Released: 01/30/2001 Document Revised: 04/26/2011 Document Reviewed: 07/23/2007 [facility] Patient Information 2012 [place].

## 2011-12-06 NOTE — Assessment & Plan Note (Signed)
Refill Lovaza-- pt has been off of it for a month Check labs Schedule cpe

## 2011-12-10 ENCOUNTER — Encounter: Payer: Self-pay | Admitting: *Deleted

## 2011-12-10 MED ORDER — COLESEVELAM HCL 3.75 G PO PACK: 1.0000 | PACK | Freq: Every day | ORAL | Status: DC

## 2012-01-25 ENCOUNTER — Other Ambulatory Visit: Payer: Self-pay | Admitting: Family Medicine

## 2012-01-25 DIAGNOSIS — E785 Hyperlipidemia, unspecified: Secondary | ICD-10-CM

## 2012-01-25 MED ORDER — OMEGA-3-ACID ETHYL ESTERS 1 G PO CAPS: 2.0000 g | ORAL_CAPSULE | Freq: Two times a day (BID) | ORAL | Status: DC

## 2012-01-25 NOTE — Telephone Encounter (Signed)
Refill LOVAZA 1 GM Capsule  Take 2 capsules (2 g total) by mouth 2 (two) times daily. #120--last fill 7.17.13  Last ov 7.18.13 follow up

## 2012-01-28 ENCOUNTER — Telehealth: Payer: Self-pay | Admitting: Cardiology

## 2012-01-28 NOTE — Telephone Encounter (Signed)
Error

## 2012-02-18 ENCOUNTER — Ambulatory Visit (INDEPENDENT_AMBULATORY_CARE_PROVIDER_SITE_OTHER): Payer: Managed Care, Other (non HMO) | Admitting: Cardiology

## 2012-02-18 ENCOUNTER — Encounter: Payer: Self-pay | Admitting: Cardiology

## 2012-02-18 VITALS — BP 136/82 | HR 91 | Ht 70.0 in | Wt 209.1 lb

## 2012-02-18 DIAGNOSIS — E7800 Pure hypercholesterolemia, unspecified: Secondary | ICD-10-CM

## 2012-02-18 DIAGNOSIS — I679 Cerebrovascular disease, unspecified: Secondary | ICD-10-CM

## 2012-02-18 DIAGNOSIS — I080 Rheumatic disorders of both mitral and aortic valves: Secondary | ICD-10-CM

## 2012-02-18 DIAGNOSIS — Z9889 Other specified postprocedural states: Secondary | ICD-10-CM

## 2012-02-18 DIAGNOSIS — I2510 Atherosclerotic heart disease of native coronary artery without angina pectoris: Secondary | ICD-10-CM

## 2012-02-18 NOTE — Assessment & Plan Note (Signed)
Most recent carotid Dopplers were normal.

## 2012-02-18 NOTE — Patient Instructions (Addendum)
Your physician wants you to follow-up in: [place] will receive a reminder letter in the mail two months in advance. If you don't receive a letter, please call our office to schedule the follow-up appointment.   Your physician has requested that you have an echocardiogram. Echocardiography is a painless test that uses sound waves to create images of your heart. It provides your doctor with information about the size and shape of your heart and how well your heart's chambers and valves are working. This procedure takes approximately one hour. There are no restrictions for this procedure.

## 2012-02-18 NOTE — Assessment & Plan Note (Signed)
Statins have caused increased liver functions previously. Continue diet. Managed by primary care.

## 2012-02-18 NOTE — Progress Notes (Signed)
HPI: Pleasant gentleman who has a history of mitral valve repair secondary to severe mitral regurgitation in February 2009. He has also had atrial flutter ablation in March 2009. Postoperatively, he had recurrent fevers that were felt secondary to a postsurgical inflammatory state. Carotid dopplers in August of 2011 were normal. Last echocardiogram in August of 2012 revealed an ejection fraction of 98-33%, grade 1 diastolic dysfunction, prior mitral valve repair, trace mitral and aortic insufficiency and moderate to severe left atrial enlargement. Patient has had elevated LFTs with statins in the past. I last saw him in August of 2012. Since then, the patient has dyspnea with more extreme activities but not with routine activities. It is relieved with rest. It is not associated with chest pain. There is no orthopnea, PND or pedal edema. There is no syncope or palpitations. There is no exertional chest pain.   Current Outpatient Prescriptions  Medication Sig Dispense Refill  . aspirin EC 81 MG tablet Take 81 mg by mouth daily.        Marland Kitchen glucosamine-chondroitin 500-400 MG tablet Take 2 tablets by mouth.        . meloxicam (MOBIC) 15 MG tablet Take 7.5 mg by mouth daily.      . Multiple Vitamins-Minerals (CENTRUM SILVER PO) Take 1 tablet by mouth daily.        Marland Kitchen omega-3 acid ethyl esters (LOVAZA) 1 G capsule Take 2 capsules (2 g total) by mouth 2 (two) times daily.  120 capsule  2  . Saw Palmetto, Serenoa repens, (SAW PALMETTO PO) Take 2 tablets by mouth daily.       Marland Kitchen amoxicillin (AMOXIL) 500 MG capsule Take 500 mg by mouth as directed. FOR DENTAL WORK ONLY      . Colesevelam HCl [facility]) 3.75 G PACK Take 1 each by mouth daily.  30 each  2     Past Medical History  Diagnosis Date  . MVP (mitral valve prolapse)   . MR (mitral regurgitation)     severe; mitral valve repair in march 2009  . Atrial flutter     s/p ablation  . HTN (hypertension)   . Hyperlipidemia   . CAD (coronary artery  disease)     cath 06/2007 40% LM  . CVD (cerebrovascular disease)     59% right ICA, 49% left ICA; h/oTIA; fu study 8/11 with normal carotids  . History of colonoscopy     Past Surgical History  Procedure Date  . Tonsillectomy 1960  . Mitral valve repair 06/2007    History   Social History  . Marital Status: Married    Spouse Name: N/A    Number of Children: N/A  . Years of Education: N/A   Occupational History  . [employer]     works from home   Social History Main Topics  . Smoking status: Never Smoker   . Smokeless tobacco: Not on file  . Alcohol Use: Yes     occasional  . Drug Use: No  . Sexually Active: Not on file   Other Topics Concern  . Not on file   Social History Narrative  . No narrative on file    ROS: no fevers or chills, productive cough, hemoptysis, dysphasia, odynophagia, melena, hematochezia, dysuria, hematuria, rash, seizure activity, orthopnea, PND, pedal edema, claudication. Remaining systems are negative.  Physical Exam: Well-developed well-nourished in no acute distress.  Skin is warm and dry.  HEENT is normal.  Neck is supple.  Chest is clear  to auscultation with normal expansion.  Cardiovascular exam is regular rate and rhythm.  Abdominal exam nontender or distended. No masses palpated. Extremities show no edema. neuro grossly intact  ECG sinus rhythm at a rate of 91. First degree AV block. Normal axis. No significant ST changes.

## 2012-02-18 NOTE — Assessment & Plan Note (Signed)
Status post mitral valve repair. Continue SBE prophylaxis. Repeat echocardiogram.

## 2012-02-18 NOTE — Assessment & Plan Note (Signed)
Continue aspirin. Intolerant to statins because of increased liver functions.

## 2012-02-20 ENCOUNTER — Ambulatory Visit (HOSPITAL_COMMUNITY): Payer: Managed Care, Other (non HMO) | Attending: Cardiovascular Disease | Admitting: Radiology

## 2012-02-20 DIAGNOSIS — I2510 Atherosclerotic heart disease of native coronary artery without angina pectoris: Secondary | ICD-10-CM | POA: Insufficient documentation

## 2012-02-20 DIAGNOSIS — I059 Rheumatic mitral valve disease, unspecified: Secondary | ICD-10-CM | POA: Insufficient documentation

## 2012-02-20 DIAGNOSIS — Z9889 Other specified postprocedural states: Secondary | ICD-10-CM

## 2012-02-20 DIAGNOSIS — I379 Nonrheumatic pulmonary valve disorder, unspecified: Secondary | ICD-10-CM | POA: Insufficient documentation

## 2012-02-20 NOTE — Progress Notes (Signed)
Echocardiogram performed.  

## 2012-02-26 ENCOUNTER — Ambulatory Visit (INDEPENDENT_AMBULATORY_CARE_PROVIDER_SITE_OTHER): Payer: Managed Care, Other (non HMO) | Admitting: Family Medicine

## 2012-02-26 ENCOUNTER — Ambulatory Visit (HOSPITAL_BASED_OUTPATIENT_CLINIC_OR_DEPARTMENT_OTHER)
Admission: RE | Admit: 2012-02-26 | Discharge: 2012-02-26 | Disposition: A | Discharge status: Home / Self Care | Payer: Managed Care, Other (non HMO) | Source: Ambulatory Visit | Attending: Family Medicine | Admitting: Family Medicine

## 2012-02-26 ENCOUNTER — Encounter: Payer: Self-pay | Admitting: Family Medicine

## 2012-02-26 VITALS — BP 132/84 | HR 75 | Temp 98.5°F | Ht 68.75 in | Wt 210.2 lb

## 2012-02-26 DIAGNOSIS — M549 Dorsalgia, unspecified: Secondary | ICD-10-CM

## 2012-02-26 DIAGNOSIS — K635 Polyp of colon: Secondary | ICD-10-CM

## 2012-02-26 DIAGNOSIS — E785 Hyperlipidemia, unspecified: Secondary | ICD-10-CM

## 2012-02-26 DIAGNOSIS — I6529 Occlusion and stenosis of unspecified carotid artery: Secondary | ICD-10-CM

## 2012-02-26 DIAGNOSIS — Z0000 Encounter for general adult medical examination without abnormal findings: Secondary | ICD-10-CM

## 2012-02-26 DIAGNOSIS — Z23 Encounter for immunization: Secondary | ICD-10-CM

## 2012-02-26 DIAGNOSIS — E7800 Pure hypercholesterolemia, unspecified: Secondary | ICD-10-CM

## 2012-02-26 DIAGNOSIS — D126 Benign neoplasm of colon, unspecified: Secondary | ICD-10-CM

## 2012-02-26 DIAGNOSIS — M545 Low back pain, unspecified: Secondary | ICD-10-CM | POA: Insufficient documentation

## 2012-02-26 DIAGNOSIS — I2510 Atherosclerotic heart disease of native coronary artery without angina pectoris: Secondary | ICD-10-CM

## 2012-02-26 LAB — HEPATIC FUNCTION PANEL
ALT: 76 U/L — ABNORMAL HIGH (ref 0–53)
AST: 34 U/L (ref 0–37)
Albumin: 4.3 g/dL (ref 3.5–5.2)
Total Bilirubin: 1.4 mg/dL — ABNORMAL HIGH (ref 0.3–1.2)

## 2012-02-26 LAB — LDL CHOLESTEROL, DIRECT: Direct LDL: 189.2 mg/dL

## 2012-02-26 LAB — CBC WITH DIFFERENTIAL/PLATELET
Basophils Absolute: 0 10*3/uL (ref 0.0–0.1)
HCT: 44.9 % (ref 39.0–52.0)
Hemoglobin: 15 g/dL (ref 13.0–17.0)
Lymphs Abs: 1.8 10*3/uL (ref 0.7–4.0)
MCV: 92.7 fl (ref 78.0–100.0)
Monocytes Absolute: 0.5 10*3/uL (ref 0.1–1.0)
Monocytes Relative: 8.1 % (ref 3.0–12.0)
Neutro Abs: 3.4 10*3/uL (ref 1.4–7.7)
Platelets: 201 10*3/uL (ref 150.0–400.0)
RDW: 12 % (ref 11.5–14.6)

## 2012-02-26 LAB — BASIC METABOLIC PANEL
BUN: 14 mg/dL (ref 6–23)
CO2: 24 mEq/L (ref 19–32)
Chloride: 104 mEq/L (ref 96–112)
GFR: 91.85 mL/min (ref >60.00)
Glucose, Bld: 85 mg/dL (ref 70–99)
Potassium: 4.1 mEq/L (ref 3.5–5.1)
Sodium: 136 mEq/L (ref 135–145)

## 2012-02-26 LAB — LIPID PANEL
Cholesterol: 264 mg/dL — ABNORMAL HIGH (ref 0–200)
Triglycerides: 123 mg/dL (ref 0.0–149.0)

## 2012-02-26 MED ORDER — COLESEVELAM HCL 625 MG PO TABS: 1875.0000 mg | ORAL_TABLET | Freq: Two times a day (BID) | ORAL | Status: DC

## 2012-02-26 MED ORDER — CYCLOBENZAPRINE HCL 10 MG PO TABS: 10.0000 mg | ORAL_TABLET | Freq: Three times a day (TID) | ORAL | Status: DC | PRN

## 2012-02-26 NOTE — Progress Notes (Signed)
Subjective:    Patient ID: [name], male    DOB: March 29, 1951, 61 y.o.   MRN: [id number]  HPI Pt here for cpe and labs.   Pt c/o low back pain.  Pain started about 1 month ago.  He was moving an instrument for work and next day he felt sensitivity in back and that weekend everytime he put pressure on R foot it hurt and pain is in R hip and goes down R leg to foot.    Past Medical History  Diagnosis Date  . MVP (mitral valve prolapse)   . MR (mitral regurgitation)     severe; mitral valve repair in march 2009  . Atrial flutter     s/p ablation  . HTN (hypertension)   . Hyperlipidemia   . CAD (coronary artery disease)     cath 06/2007 40% LM  . CVD (cerebrovascular disease)     59% right ICA, 49% left ICA; h/oTIA; fu study 8/11 with normal carotids  . History of colonoscopy    History   Social History  . Marital Status: Married    Spouse Name: N/A    Number of Children: N/A  . Years of Education: N/A   Occupational History  . [employer] and [employer]     works from home   Social History Main Topics  . Smoking status: Never Smoker   . Smokeless tobacco: Not on file  . Alcohol Use: Yes     occasional  . Drug Use: No  . Sexually Active: Yes -- Male partner(s)   Other Topics Concern  . Not on file   Social History Narrative   Exercise--  [employer] gym   Family History  Problem Relation Age of Onset  . Diabetes    . Liver disease Father     cancer  . Hyperlipidemia Father   . Colon cancer Neg Hx   . Colitis Daughter   . Crohn's disease Daughter    Current outpatient prescriptions:amoxicillin (AMOXIL) 500 MG capsule, Take 500 mg by mouth as directed. FOR DENTAL WORK ONLY, Disp: , Rfl: ;  aspirin EC 81 MG tablet, Take 81 mg by mouth daily.  , Disp: , Rfl: ;  glucosamine-chondroitin 500-400 MG tablet, Take 2 tablets by mouth.  , Disp: , Rfl: ;  meloxicam (MOBIC) 15 MG tablet, Take 7.5 mg by mouth daily., Disp: , Rfl:  Multiple Vitamins-Minerals (CENTRUM SILVER PO), Take 1  tablet by mouth daily.  , Disp: , Rfl: ;  omega-3 acid ethyl esters (LOVAZA) 1 G capsule, Take 2 capsules (2 g total) by mouth 2 (two) times daily., Disp: 120 capsule, Rfl: 2;  Saw Palmetto, Serenoa repens, (SAW PALMETTO PO), Take 2 tablets by mouth daily. , Disp: , Rfl:  colesevelam (WELCHOL) 625 MG tablet, Take 3 tablets (1,875 mg total) by mouth 2 (two) times daily with a meal., Disp: 180 tablet, Rfl: 5  Review of Systems Review of Systems  Constitutional: Negative for activity change, appetite change and fatigue.  HENT: Negative for hearing loss, congestion, tinnitus and ear discharge.  dentist [id number]: Negative for visual disturbance (see optho q1y -- vision corrected to 20/20 with glasses).  Respiratory: Negative for cough, chest tightness and shortness of breath.   Cardiovascular: Negative for chest pain, palpitations and leg swelling.  Gastrointestinal: Negative for abdominal pain, diarrhea, constipation and abdominal distention.  Genitourinary: Negative for urgency, frequency, decreased urine volume and difficulty urinating.  Musculoskeletal: +bakc Skin: Negative for color change, pallor and rash.  Neurological: Negative for dizziness, light-headedness, numbness and headaches.  Hematological: Negative for adenopathy. Does not bruise/bleed easily.  Psychiatric/Behavioral: Negative for suicidal ideas, confusion, sleep disturbance, self-injury, dysphoric mood, decreased concentration and agitation.        Objective:   Physical Exam BP 132/84  Pulse 75  Temp 98.5 F (36.9 C) (Oral)  Ht 5' 8.75" (1.746 m)  Wt 210 lb 3.2 oz (95.346 kg)  BMI 31.27 kg/m2  SpO2 97%  General Appearance:    Alert, cooperative, no distress, appears stated age  Head:    Normocephalic, without obvious abnormality, atraumatic  Eyes:    PERRL, conjunctiva/corneas clear, EOM's intact, fundi    benign, both eyes       Ears:    Normal TM's and external ear canals, both ears  Nose:   Nares normal, septum  midline, mucosa normal, no drainage   or sinus tenderness  Throat:   Lips, mucosa, and tongue normal; teeth and gums normal  Neck:   Supple, symmetrical, trachea midline, no adenopathy;       thyroid:  No enlargement/tenderness/nodules; no carotid   bruit or JVD  Back:     Symmetric, no curvature, ROM normal, no CVA tenderness  Lungs:     Clear to auscultation bilaterally, respirations unlabored  Chest wall:    No tenderness or deformity  Heart:    Regular rate and rhythm, S1 and S2 normal, no murmur, rub   or gallop  Abdomen:     Soft, non-tender, bowel sounds active all four quadrants,    no masses, no organomegaly  Genitalia:    Normal male without lesion, discharge or tenderness  Rectal:    Normal tone, normal prostate, no masses or tenderness;   guaiac negative stool  Extremities:   Extremities normal, atraumatic, no cyanosis or edema  Pulses:   2+ and symmetric all extremities  Skin:   Skin color, texture, turgor normal, no rashes or lesions  Lymph nodes:   Cervical, supraclavicular, and axillary nodes normal  Neurologic:   CNII-XII intact. Normal strength, sensation and reflexes      throughout          Assessment & Plan:  cpe--   Check labs               ghm utd

## 2012-02-26 NOTE — Assessment & Plan Note (Signed)
Check labs 

## 2012-02-26 NOTE — Assessment & Plan Note (Signed)
Check labs Started on welchol pills

## 2012-02-26 NOTE — Assessment & Plan Note (Signed)
Per cardio 

## 2012-02-26 NOTE — Patient Instructions (Addendum)
Preventive Care for Adults, Male A healthy lifestyle and preventive care can promote health and wellness. Preventive health guidelines for men include the following key practices:  A routine yearly physical is a good way to check with your caregiver about your health and preventative screening. It is a chance to share any concerns and updates on your health, and to receive a thorough exam.  Visit your dentist for a routine exam and preventative care every 6 months. Brush your teeth twice a day and floss once a day. Good oral hygiene prevents tooth decay and gum disease.  The frequency of eye exams is based on your age, health, family medical history, use of contact lenses, and other factors. Follow your caregiver's recommendations for frequency of eye exams.  Eat a healthy diet. Foods like vegetables, fruits, whole grains, low-fat dairy products, and lean protein foods contain the nutrients you need without too many calories. Decrease your intake of foods high in solid fats, added sugars, and salt. Eat the right amount of calories for you.Get information about a proper diet from your caregiver, if necessary.  Regular physical exercise is one of the most important things you can do for your health. Most adults should get at least 150 minutes of moderate-intensity exercise (any activity that increases your heart rate and causes you to sweat) each week. In addition, most adults need muscle-strengthening exercises on 2 or more days a week.  Maintain a healthy weight. The body mass index (BMI) is a screening tool to identify possible weight problems. It provides an estimate of body fat based on height and weight. Your caregiver can help determine your BMI, and can help you achieve or maintain a healthy weight.For adults 20 years and older:  A BMI below 18.5 is considered underweight.  A BMI of 18.5 to 24.9 is normal.  A BMI of 25 to 29.9 is considered overweight.  A BMI of 30 and above is  considered obese.  Maintain normal blood lipids and cholesterol levels by exercising and minimizing your intake of saturated fat. Eat a balanced diet with plenty of fruit and vegetables. Blood tests for lipids and cholesterol should begin at age 25 and be repeated every 5 years. If your lipid or cholesterol levels are high, you are over 50, or you are a high risk for heart disease, you may need your cholesterol levels checked more frequently.Ongoing high lipid and cholesterol levels should be treated with medicines if diet and exercise are not effective.  If you smoke, find out from your caregiver how to quit. If you do not use tobacco, do not start.  If you choose to drink alcohol, do not exceed 2 drinks per day. One drink is considered to be 12 ounces (355 mL) of beer, 5 ounces (148 mL) of wine, or 1.5 ounces (44 mL) of liquor.  Avoid use of street drugs. Do not share needles with anyone. Ask for help if you need support or instructions about stopping the use of drugs.  High blood pressure causes heart disease and increases the risk of stroke. Your blood pressure should be checked at least every 1 to 2 years. Ongoing high blood pressure should be treated with medicines, if weight loss and exercise are not effective.  If you are 36 to 59 years old, ask your caregiver if you should take aspirin to prevent heart disease.  Diabetes screening involves taking a blood sample to check your fasting blood sugar level. This should be done once every 3 years,  after age 25, if you are within normal weight and without risk factors for diabetes. Testing should be considered at a younger age or be carried out more frequently if you are overweight and have at least 1 risk factor for diabetes.  Colorectal cancer can be detected and often prevented. Most routine colorectal cancer screening begins at the age of 100 and continues through age 59. However, your caregiver may recommend screening at an earlier age if you  have risk factors for colon cancer. On a yearly basis, your caregiver may provide home test kits to check for hidden blood in the stool. Use of a small camera at the end of a tube, to directly examine the colon (sigmoidoscopy or colonoscopy), can detect the earliest forms of colorectal cancer. Talk to your caregiver about this at age 45, when routine screening begins. Direct examination of the colon should be repeated every 5 to 10 years through age 71, unless early forms of pre-cancerous polyps or small growths are found.  Hepatitis C blood testing is recommended for all people born from [birth year] through 1965 and any individual with known risks for hepatitis C.  Practice safe sex. Use condoms and avoid high-risk sexual practices to reduce the spread of sexually transmitted infections (STIs). STIs include gonorrhea, chlamydia, syphilis, trichomonas, herpes, HPV, and human immunodeficiency virus (HIV). Herpes, HIV, and HPV are viral illnesses that have no cure. They can result in disability, cancer, and death.  A one-time screening for abdominal aortic aneurysm (AAA) and surgical repair of large AAAs by sound wave imaging (ultrasonography) is recommended for ages 98 to 76 years who are current or former smokers.  Healthy men should no longer receive prostate-specific antigen (PSA) blood tests as part of routine cancer screening. Consult with your caregiver about prostate cancer screening.  Testicular cancer screening is not recommended for adult males who have no symptoms. Screening includes self-exam, caregiver exam, and other screening tests. Consult with your caregiver about any symptoms you have or any concerns you have about testicular cancer.  Use sunscreen with skin protection factor (SPF) of 30 or more. Apply sunscreen liberally and repeatedly throughout the day. You should seek shade when your shadow is shorter than you. Protect yourself by wearing long sleeves, pants, a wide-brimmed hat, and  sunglasses year round, whenever you are outdoors.  Once a month, do a whole body skin exam, using a mirror to look at the skin on your back. Notify your caregiver of new moles, moles that have irregular borders, moles that are larger than a pencil eraser, or moles that have changed in shape or color.  Stay current with required immunizations.  Influenza. You need a dose every fall (or winter). The composition of the flu vaccine changes each year, so being vaccinated once is not enough.  Pneumococcal polysaccharide. You need 1 to 2 doses if you smoke cigarettes or if you have certain chronic medical conditions. You need 1 dose at age 58 (or older) if you have never been vaccinated.  Tetanus, diphtheria, pertussis (Tdap, Td). Get 1 dose of Tdap vaccine if you are younger than age 72 years, are over 58 and have contact with an infant, are a Dietitian, or simply want to be protected from whooping cough. After that, you need a Td booster dose every 10 years. Consult your caregiver if you have not had at least 3 tetanus and diphtheria-containing shots sometime in your life or have a deep or dirty wound.  HPV. This vaccine is recommended  for males 7 through 59 years of age. This vaccine may be given to men 89 through 59 years of age who have not completed the 3 dose series. It is recommended for men through age 25 who have sex with men or whose immune system is weakened because of HIV infection, other illness, or medications. The vaccine is given in 3 doses over 6 months.  Measles, mumps, rubella (MMR). You need at least 1 dose of MMR if you were born in 1957 or later. You may also need a 2nd dose.  Meningococcal. If you are age 73 to 73 years and a Market researcher living in a residence hall, or have one of several medical conditions, you need to get vaccinated against meningococcal disease. You may also need additional booster doses.  Zoster (shingles). If you are age 102 years or  older, you should get this vaccine.  Varicella (chickenpox). If you have never had chickenpox or you were vaccinated but received only 1 dose, talk to your caregiver to find out if you need this vaccine.  Hepatitis A. You need this vaccine if you have a specific risk factor for hepatitis A virus infection, or you simply wish to be protected from this disease. The vaccine is usually given as 2 doses, 6 to 18 months apart.  Hepatitis B. You need this vaccine if you have a specific risk factor for hepatitis B virus infection or you simply wish to be protected from this disease. The vaccine is given in 3 doses, usually over 6 months. Preventative Service / Frequency Ages 21 to 10  Blood pressure check.** / Every 1 to 2 years.  Lipid and cholesterol check.** / Every 5 years beginning at age 50.  Hepatitis C blood test.** / For any individual with known risks for hepatitis C.  Skin self-exam. / Monthly.  Influenza immunization.** / Every year.  Pneumococcal polysaccharide immunization.** / 1 to 2 doses if you smoke cigarettes or if you have certain chronic medical conditions.  Tetanus, diphtheria, pertussis (Tdap,Td) immunization. / A one-time dose of Tdap vaccine. After that, you need a Td booster dose every 10 years.  HPV immunization. / 3 doses over 6 months, if 99 and younger.  Measles, mumps, rubella (MMR) immunization. / You need at least 1 dose of MMR if you were born in 1957 or later. You may also need a 2nd dose.  Meningococcal immunization. / 1 dose if you are age 37 to 48 years and a Market researcher living in a residence hall, or have one of several medical conditions, you need to get vaccinated against meningococcal disease. You may also need additional booster doses.  Varicella immunization.** / Consult your caregiver.  Hepatitis A immunization.** / Consult your caregiver. 2 doses, 6 to 18 months apart.  Hepatitis B immunization.** / Consult your caregiver. 3 doses  usually over 6 months. Ages 46 to 14  Blood pressure check.** / Every 1 to 2 years.  Lipid and cholesterol check.** / Every 5 years beginning at age 73.  Fecal occult blood test (FOBT) of stool. / Every year beginning at age 56 and continuing until age 44. You may not have to do this test if you get colonoscopy every 10 years.  Flexible sigmoidoscopy** or colonoscopy.** / Every 5 years for a flexible sigmoidoscopy or every 10 years for a colonoscopy beginning at age 70 and continuing until age 29.  Hepatitis C blood test.** / For all people born from [birth year] through 1965 and any  individual with known risks for hepatitis C.  Skin self-exam. / Monthly.  Influenza immunization.** / Every year.  Pneumococcal polysaccharide immunization.** / 1 to 2 doses if you smoke cigarettes or if you have certain chronic medical conditions.  Tetanus, diphtheria, pertussis (Tdap/Td) immunization.** / A one-time dose of Tdap vaccine. After that, you need a Td booster dose every 10 years.  Measles, mumps, rubella (MMR) immunization. / You need at least 1 dose of MMR if you were born in 1957 or later. You may also need a 2nd dose.  Varicella immunization.**/ Consult your caregiver.  Meningococcal immunization.** / Consult your caregiver.  Hepatitis A immunization.** / Consult your caregiver. 2 doses, 6 to 18 months apart.  Hepatitis B immunization.** / Consult your caregiver. 3 doses, usually over 6 months. Ages 80 and over  Blood pressure check.** / Every 1 to 2 years.  Lipid and cholesterol check.**/ Every 5 years beginning at age 34.  Fecal occult blood test (FOBT) of stool. / Every year beginning at age 55 and continuing until age 77. You may not have to do this test if you get colonoscopy every 10 years.  Flexible sigmoidoscopy** or colonoscopy.** / Every 5 years for a flexible sigmoidoscopy or every 10 years for a colonoscopy beginning at age 43 and continuing until age 94.  Hepatitis C blood  test.** / For all people born from [birth year] through 1965 and any individual with known risks for hepatitis C.  Abdominal aortic aneurysm (AAA) screening.** / A one-time screening for ages 63 to 81 years who are current or former smokers.  Skin self-exam. / Monthly.  Influenza immunization.** / Every year.  Pneumococcal polysaccharide immunization.** / 1 dose at age 67 (or older) if you have never been vaccinated.  Tetanus, diphtheria, pertussis (Tdap, Td) immunization. / A one-time dose of Tdap vaccine if you are over 65 and have contact with an infant, are a Dietitian, or simply want to be protected from whooping cough. After that, you need a Td booster dose every 10 years.  Varicella immunization. ** / Consult your caregiver.  Meningococcal immunization.** / Consult your caregiver.  Hepatitis A immunization. ** / Consult your caregiver. 2 doses, 6 to 18 months apart.  Hepatitis B immunization.** / Check with your caregiver. 3 doses, usually over 6 months. **Family history and personal history of risk and conditions may change your caregiver's recommendations. Document Released: 07/03/2001 Document Revised: 07/30/2011 Document Reviewed: 10/02/2010 [facility] Patient Information 2013 [place].   Back Pain, Adult Low back pain is very common. About 1 in 5 people have back pain.The cause of low back pain is rarely dangerous. The pain often gets better over time.About half of people with a sudden onset of back pain feel better in just 2 weeks. About 8 in 10 people feel better by 6 weeks.  CAUSES Some common causes of back pain include:  Strain of the muscles or ligaments supporting the spine.  Wear and tear (degeneration) of the spinal discs.  Arthritis.  Direct injury to the back. DIAGNOSIS Most of the time, the direct cause of low back pain is not known.However, back pain can be treated effectively even when the exact cause of the pain is unknown.Answering your  caregiver's questions about your overall health and symptoms is one of the most accurate ways to make sure the cause of your pain is not dangerous. If your caregiver needs more information, he or she may order lab work or imaging tests (X-rays or MRIs).However, even if  imaging tests show changes in your back, this usually does not require surgery. HOME CARE INSTRUCTIONS For many people, back pain returns.Since low back pain is rarely dangerous, it is often a condition that people can learn to [facility] their own.   Remain active. It is stressful on the back to sit or stand in one place. Do not sit, drive, or stand in one place for more than 30 minutes at a time. Take short walks on level surfaces as soon as pain allows.Try to increase the length of time you walk each day.  Do not stay in bed.Resting more than 1 or 2 days can delay your recovery.  Do not avoid exercise or work.Your body is made to move.It is not dangerous to be active, even though your back may hurt.Your back will likely heal faster if you return to being active before your pain is gone.  Pay attention to your body when you bend and lift. Many people have less discomfortwhen lifting if they bend their knees, keep the load close to their bodies,and avoid twisting. Often, the most comfortable positions are those that put less stress on your recovering back.  Find a comfortable position to sleep. Use a firm mattress and lie on your side with your knees slightly bent. If you lie on your back, put a pillow under your knees.  Only take over-the-counter or prescription medicines as directed by your caregiver. Over-the-counter medicines to reduce pain and inflammation are often the most helpful.Your caregiver may prescribe muscle relaxant drugs.These medicines help dull your pain so you can more quickly return to your normal activities and healthy exercise.  Put ice on the injured area.  Put ice in a plastic bag.  Place a  towel between your skin and the bag.  Leave the ice on for 15 to 20 minutes, 3 to 4 times a day for the first 2 to 3 days. After that, ice and heat may be alternated to reduce pain and spasms.  Ask your caregiver about trying back exercises and gentle massage. This may be of some benefit.  Avoid feeling anxious or stressed.Stress increases muscle tension and can worsen back pain.It is important to recognize when you are anxious or stressed and learn ways to manage it.Exercise is a great option. SEEK MEDICAL CARE IF:  You have pain that is not relieved with rest or medicine.  You have pain that does not improve in 1 week.  You have new symptoms.  You are generally not feeling well. SEEK IMMEDIATE MEDICAL CARE IF:   You have pain that radiates from your back into your legs.  You develop new bowel or bladder control problems.  You have unusual weakness or numbness in your arms or legs.  You develop nausea or vomiting.  You develop abdominal pain.  You feel faint. Document Released: 05/07/2005 Document Revised: 11/06/2011 Document Reviewed: 09/25/2010 [facility] Patient Information 2013 [place].

## 2012-02-27 ENCOUNTER — Telehealth: Payer: Self-pay

## 2012-02-27 ENCOUNTER — Encounter: Payer: Self-pay | Admitting: Gastroenterology

## 2012-02-27 DIAGNOSIS — M549 Dorsalgia, unspecified: Secondary | ICD-10-CM

## 2012-02-27 LAB — HEPATITIS B SURFACE ANTIBODY,QUALITATIVE: Hep B S Ab: NEGATIVE

## 2012-02-27 NOTE — Telephone Encounter (Signed)
Message copied by Ewing Schlein on Wed Feb 27, 2012  3:20 PM ------      Message from: Rosalita Chessman      Created: Wed Feb 27, 2012 10:17 AM       Refer ortho or we can try PT first.

## 2012-02-27 NOTE — Telephone Encounter (Signed)
Discussed with patient and she voiced understanding.      KP

## 2012-03-06 ENCOUNTER — Encounter: Payer: Self-pay | Admitting: Gastroenterology

## 2012-03-11 ENCOUNTER — Ambulatory Visit: Payer: Managed Care, Other (non HMO) | Attending: Family Medicine | Admitting: Physical Therapy

## 2012-03-11 DIAGNOSIS — M2569 Stiffness of other specified joint, not elsewhere classified: Secondary | ICD-10-CM | POA: Insufficient documentation

## 2012-03-11 DIAGNOSIS — IMO0001 Reserved for inherently not codable concepts without codable children: Secondary | ICD-10-CM | POA: Insufficient documentation

## 2012-03-11 DIAGNOSIS — M545 Low back pain, unspecified: Secondary | ICD-10-CM | POA: Insufficient documentation

## 2012-03-13 ENCOUNTER — Ambulatory Visit: Payer: Managed Care, Other (non HMO) | Admitting: Physical Therapy

## 2012-03-17 ENCOUNTER — Ambulatory Visit (AMBULATORY_SURGERY_CENTER): Payer: Managed Care, Other (non HMO) | Admitting: *Deleted

## 2012-03-17 ENCOUNTER — Ambulatory Visit: Payer: Managed Care, Other (non HMO) | Admitting: Physical Therapy

## 2012-03-17 VITALS — Ht 69.0 in | Wt 205.0 lb

## 2012-03-17 DIAGNOSIS — Z1211 Encounter for screening for malignant neoplasm of colon: Secondary | ICD-10-CM

## 2012-03-17 MED ORDER — MOVIPREP 100 G PO SOLR: ORAL | Status: DC

## 2012-03-19 ENCOUNTER — Encounter: Payer: Self-pay | Admitting: Gastroenterology

## 2012-03-20 ENCOUNTER — Ambulatory Visit: Payer: Managed Care, Other (non HMO) | Admitting: Physical Therapy

## 2012-03-24 ENCOUNTER — Ambulatory Visit: Payer: Managed Care, Other (non HMO) | Attending: Family Medicine | Admitting: Physical Therapy

## 2012-03-24 DIAGNOSIS — IMO0001 Reserved for inherently not codable concepts without codable children: Secondary | ICD-10-CM | POA: Insufficient documentation

## 2012-03-24 DIAGNOSIS — M545 Low back pain, unspecified: Secondary | ICD-10-CM | POA: Insufficient documentation

## 2012-03-24 DIAGNOSIS — M2569 Stiffness of other specified joint, not elsewhere classified: Secondary | ICD-10-CM | POA: Insufficient documentation

## 2012-03-28 ENCOUNTER — Ambulatory Visit: Payer: Managed Care, Other (non HMO) | Admitting: Physical Therapy

## 2012-03-31 ENCOUNTER — Ambulatory Visit (AMBULATORY_SURGERY_CENTER): Payer: Managed Care, Other (non HMO) | Admitting: Gastroenterology

## 2012-03-31 ENCOUNTER — Encounter: Payer: Self-pay | Admitting: Gastroenterology

## 2012-03-31 VITALS — BP 128/77 | HR 55 | Temp 96.0°F | Resp 16 | Ht 69.0 in | Wt 205.0 lb

## 2012-03-31 DIAGNOSIS — Z1211 Encounter for screening for malignant neoplasm of colon: Secondary | ICD-10-CM

## 2012-03-31 DIAGNOSIS — D126 Benign neoplasm of colon, unspecified: Secondary | ICD-10-CM

## 2012-03-31 MED ORDER — SODIUM CHLORIDE 0.9 % IV SOLN: 500.0000 mL | INTRAVENOUS | Status: DC

## 2012-03-31 NOTE — Patient Instructions (Addendum)
A handout was given to your care partner on polyps.  You may resume your current medications today.  Please call if any questions or concerns.    YOU HAD AN ENDOSCOPIC PROCEDURE TODAY AT [facility]: Refer to the procedure report that was given to you for any specific questions about what was found during the examination.  If the procedure report does not answer your questions, please call your gastroenterologist to clarify.  If you requested that your care partner not be given the details of your procedure findings, then the procedure report has been included in a sealed envelope for you to review at your convenience later.  YOU SHOULD EXPECT: Some feelings of bloating in the abdomen. Passage of more gas than usual.  Walking can help get rid of the air that was put into your GI tract during the procedure and reduce the bloating. If you had a lower endoscopy (such as a colonoscopy or flexible sigmoidoscopy) you may notice spotting of blood in your stool or on the toilet paper. If you underwent a bowel prep for your procedure, then you may not have a normal bowel movement for a few days.  DIET: Your first meal following the procedure should be a light meal and then it is ok to progress to your normal diet.  A half-sandwich or bowl of soup is an example of a good first meal.  Heavy or fried foods are harder to digest and may make you feel nauseous or bloated.  Likewise meals heavy in dairy and vegetables can cause extra gas to form and this can also increase the bloating.  Drink plenty of fluids but you should avoid alcoholic beverages for 24 hours.  ACTIVITY: Your care partner should take you home directly after the procedure.  You should plan to take it easy, moving slowly for the rest of the day.  You can resume normal activity the day after the procedure however you should NOT DRIVE or use heavy machinery for 24 hours (because of the sedation medicines used during the test).    SYMPTOMS  TO REPORT IMMEDIATELY: A gastroenterologist can be reached at any hour.  During normal business hours, 8:30 AM to 5:00 PM Monday through Friday, call [phone].  After hours and on weekends, please call the GI answering service at [phone] who will take a message and have the physician on call contact you.   Following lower endoscopy (colonoscopy or flexible sigmoidoscopy):  Excessive amounts of blood in the stool  Significant tenderness or worsening of abdominal pains  Swelling of the abdomen that is new, acute  Fever of 100F or higher    FOLLOW UP: If any biopsies were taken you will be contacted by phone or by letter within the next 1-3 weeks.  Call your gastroenterologist if you have not heard about the biopsies in 3 weeks.  Our staff will call the home number listed on your records the next business day following your procedure to check on you and address any questions or concerns that you may have at that time regarding the information given to you following your procedure. This is a courtesy call and so if there is no answer at the home number and we have not heard from you through the emergency physician on call, we will assume that you have returned to your regular daily activities without incident.  SIGNATURES/CONFIDENTIALITY: You and/or your care partner have signed paperwork which will be entered into your electronic medical record.  These signatures attest to the fact that that the information above on your After Visit Summary has been reviewed and is understood.  Full responsibility of the confidentiality of this discharge information lies with you and/or your care-partner.

## 2012-03-31 NOTE — Op Note (Signed)
[facility]  [address] [place], [address]   COLONOSCOPY PROCEDURE REPORT  PATIENT: [name], [name]  MR#: [id number] BIRTHDATE: Jul 29, 1950 , 60  yrs. old GENDER: Male ENDOSCOPIST: Sable Feil, MD, [facility] REFERRED BY: PROCEDURE DATE:  03/31/2012 PROCEDURE:   Colonoscopy with snare polypectomy ASA CLASS:   Class III INDICATIONS: MEDICATIONS: propofol (Diprivan) 217m IV  DESCRIPTION OF PROCEDURE:   After the risks and benefits and of the procedure were explained, informed consent was obtained.  A digital rectal exam revealed no abnormalities of the rectum.    The LB [id number] endoscope was introduced through the anus and advanced to the cecum, which was identified by both the appendix and ileocecal valve .  The quality of the prep was good, using MoviPrep .  The instrument was then slowly withdrawn as the colon was fully examined.     COLON FINDINGS: A smooth flat polyp ranging between 3-549min size was found in the sigmoid colon.  A polypectomy was performed with a cold snare.  The resection was complete and the polyp tissue was completely retrieved.   A normal appearing cecum, ileocecal valve, and appendiceal orifice were identified.  The ascending, hepatic flexure, transverse, splenic flexure, descending, sigmoid colon and rectum appeared unremarkable.  No polyps or cancers were seen. The scope was then withdrawn from the patient and the procedure completed.  COMPLICATIONS: There were no complications. ENDOSCOPIC IMPRESSION: 1.   Flat polyp ranging between 3-14m61mn size was found in the sigmoid colon; polypectomy was performed with a cold snare ...r/o adenoma 2.   Normal colon otherwise  RECOMMENDATIONS: 1.  Repeat colonoscopy in 5 years if polyp adenomatous; otherwise 10 years 2.  Continue current medications   REPEAT EXAM:  cc:Dr Lowne  _______________________________ eSigned:  Sable FeilD, FACWillapa Harbor Hospital/03/2012 11:50  AM

## 2012-03-31 NOTE — Progress Notes (Signed)
The pt advised in admitting that he breaks out in a red rash to adhesives.  Paper tape was used for his IV.  We left the electrodes off until the pt was taken to the precedure room.  Maw   Electrodes were removed from the pt's chest, there was faint redness seen the outline of the electrodes.  The pt was given the chlorhexidine to wipe over all three areas.  He said the redness would eventually go away not to worry. Maw

## 2012-03-31 NOTE — Progress Notes (Signed)
No complaints noted in the recovery room. Maw  Patient did not experience any of the following events: a burn prior to discharge; a fall within the facility; wrong site/side/patient/procedure/implant event; or a hospital transfer or hospital admission upon discharge from the facility. (G8907) Patient did not have preoperative order for IV antibiotic SSI prophylaxis. (G8918)  

## 2012-04-01 ENCOUNTER — Ambulatory Visit: Payer: Managed Care, Other (non HMO) | Admitting: Physical Therapy

## 2012-04-01 ENCOUNTER — Telehealth: Payer: Self-pay | Admitting: *Deleted

## 2012-04-01 NOTE — Telephone Encounter (Signed)
  Follow up Call-  Call back number 03/31/2012  Post procedure Call Back phone  # [phone] hm  Permission to leave phone message Yes     Patient questions:  Do you have a fever, pain , or abdominal swelling? no Pain Score  0 *  Have you tolerated food without any problems? yes  Have you been able to return to your normal activities? yes  Do you have any questions about your discharge instructions: Diet   no Medications  no Follow up visit  no  Do you have questions or concerns about your Care? no  Actions: * If pain score is 4 or above: No action needed, pain <4.

## 2012-04-03 ENCOUNTER — Ambulatory Visit: Payer: Managed Care, Other (non HMO) | Admitting: Physical Therapy

## 2012-04-04 ENCOUNTER — Encounter: Payer: Self-pay | Admitting: Gastroenterology

## 2012-04-08 ENCOUNTER — Ambulatory Visit: Payer: Managed Care, Other (non HMO) | Admitting: Physical Therapy

## 2012-04-11 ENCOUNTER — Ambulatory Visit: Payer: Managed Care, Other (non HMO) | Admitting: Physical Therapy

## 2012-04-14 ENCOUNTER — Ambulatory Visit: Payer: Managed Care, Other (non HMO) | Admitting: Physical Therapy

## 2012-04-22 ENCOUNTER — Ambulatory Visit: Payer: Managed Care, Other (non HMO) | Attending: Family Medicine | Admitting: Physical Therapy

## 2012-04-22 DIAGNOSIS — M545 Low back pain, unspecified: Secondary | ICD-10-CM | POA: Insufficient documentation

## 2012-04-22 DIAGNOSIS — M2569 Stiffness of other specified joint, not elsewhere classified: Secondary | ICD-10-CM | POA: Insufficient documentation

## 2012-04-22 DIAGNOSIS — IMO0001 Reserved for inherently not codable concepts without codable children: Secondary | ICD-10-CM | POA: Insufficient documentation

## 2012-04-25 ENCOUNTER — Other Ambulatory Visit: Payer: Self-pay | Admitting: Family Medicine

## 2012-04-25 ENCOUNTER — Ambulatory Visit: Payer: Managed Care, Other (non HMO) | Admitting: Physical Therapy

## 2012-04-25 DIAGNOSIS — E785 Hyperlipidemia, unspecified: Secondary | ICD-10-CM

## 2012-04-25 MED ORDER — OMEGA-3-ACID ETHYL ESTERS 1 G PO CAPS: 2.0000 g | ORAL_CAPSULE | Freq: Two times a day (BID) | ORAL | Status: DC

## 2012-04-25 NOTE — Telephone Encounter (Signed)
LOVAZA 1 GM capsule Qty:120 Last filled 11.7.13 Take 2 capsules ( 2 G total) By mouth 2 (two) times daily.

## 2012-04-28 ENCOUNTER — Ambulatory Visit: Payer: Managed Care, Other (non HMO) | Admitting: Physical Therapy

## 2012-05-01 ENCOUNTER — Ambulatory Visit: Payer: Managed Care, Other (non HMO) | Admitting: Physical Therapy

## 2012-05-02 ENCOUNTER — Ambulatory Visit: Payer: Managed Care, Other (non HMO) | Admitting: Physical Therapy

## 2012-05-06 ENCOUNTER — Ambulatory Visit: Payer: Managed Care, Other (non HMO) | Admitting: Physical Therapy

## 2012-05-08 ENCOUNTER — Ambulatory Visit: Payer: Managed Care, Other (non HMO) | Admitting: Physical Therapy

## 2012-05-09 ENCOUNTER — Ambulatory Visit: Payer: Managed Care, Other (non HMO) | Admitting: Physical Therapy

## 2012-05-12 ENCOUNTER — Ambulatory Visit: Payer: Managed Care, Other (non HMO) | Admitting: Physical Therapy

## 2012-05-15 ENCOUNTER — Ambulatory Visit: Payer: Managed Care, Other (non HMO) | Admitting: Physical Therapy

## 2012-05-20 ENCOUNTER — Ambulatory Visit: Payer: Managed Care, Other (non HMO) | Admitting: Physical Therapy

## 2012-08-09 ENCOUNTER — Other Ambulatory Visit: Payer: Self-pay | Admitting: Family Medicine

## 2012-08-25 ENCOUNTER — Ambulatory Visit (INDEPENDENT_AMBULATORY_CARE_PROVIDER_SITE_OTHER): Payer: Managed Care, Other (non HMO) | Admitting: Family Medicine

## 2012-08-25 ENCOUNTER — Encounter: Payer: Self-pay | Admitting: Family Medicine

## 2012-08-25 VITALS — BP 120/70 | HR 65 | Temp 98.3°F | Wt 204.0 lb

## 2012-08-25 DIAGNOSIS — M1990 Unspecified osteoarthritis, unspecified site: Secondary | ICD-10-CM

## 2012-08-25 DIAGNOSIS — E785 Hyperlipidemia, unspecified: Secondary | ICD-10-CM

## 2012-08-25 DIAGNOSIS — I2510 Atherosclerotic heart disease of native coronary artery without angina pectoris: Secondary | ICD-10-CM

## 2012-08-25 LAB — BASIC METABOLIC PANEL
CO2: 25 mEq/L (ref 19–32)
GFR: 75.1 mL/min (ref >60.00)
Glucose, Bld: 109 mg/dL — ABNORMAL HIGH (ref 70–99)
Potassium: 4 mEq/L (ref 3.5–5.1)
Sodium: 139 mEq/L (ref 135–145)

## 2012-08-25 LAB — HEPATIC FUNCTION PANEL
AST: 28 U/L (ref 0–37)
Albumin: 4.1 g/dL (ref 3.5–5.2)
Alkaline Phosphatase: 47 U/L (ref 39–117)
Bilirubin, Direct: 0.1 mg/dL (ref 0.0–0.3)
Total Protein: 7.2 g/dL (ref 6.0–8.3)

## 2012-08-25 LAB — LIPID PANEL: VLDL: 16.4 mg/dL (ref 0.0–40.0)

## 2012-08-25 MED ORDER — MELOXICAM 15 MG PO TABS: 7.5000 mg | ORAL_TABLET | Freq: Every day | ORAL | Status: DC

## 2012-08-25 MED ORDER — OMEGA-3-ACID ETHYL ESTERS 1 G PO CAPS: 2.0000 g | ORAL_CAPSULE | Freq: Two times a day (BID) | ORAL | Status: DC

## 2012-08-25 NOTE — Progress Notes (Signed)
Subjective:    [name] is a 62 y.o. male here for follow up of dyslipidemia. The patient does not use medications that may worsen dyslipidemias (corticosteroids, progestins, anabolic steroids, diuretics, beta-blockers, amiodarone, cyclosporine, olanzapine). The patient exercises 2 x a week. The patient is known to have coexisting coronary artery disease.   Cardiac Risk Factors Age > 45-male, > 55-male:  YES  +1  Smoking:   NO  Sig. family hx of CHD*:  NO  Hypertension:   NO  Diabetes:   NO  HDL < 35:   NO  HDL > 9:   YES  -1  Total: 0   *- Sig. family h/o CHD per NCEP = MI or sudden death at <55yo in  father or other 1st-degree male relative, or <65yo in mother or  other 1st-degree male relative  The following portions of the patient's history were reviewed and updated as appropriate:  He  has a past medical history of MVP (mitral valve prolapse); MR (mitral regurgitation); Atrial flutter; Hyperlipidemia; CAD (coronary artery disease); CVD (cerebrovascular disease); and History of colonoscopy. He  does not have any pertinent problems on file. He  has past surgical history that includes Tonsillectomy (1960) and Mitral valve repair (06/2007). His family history includes Colitis in his daughter; Crohn's disease in his daughter; Diabetes in an unspecified family member; Hyperlipidemia in his father; Liver cancer in his father; and Liver disease in his father.  There is no history of Colon cancer, and Esophageal cancer, and Rectal cancer, and Stomach cancer, . He  reports that he has never smoked. He has never used smokeless tobacco. He reports that  drinks alcohol. He reports that he does not use illicit drugs. He has a current medication list which includes the following prescription(s): amoxicillin, aspirin ec, glucosamine-chondroitin, lovaza, melatonin, meloxicam, multiple vitamins-minerals, and saw palmetto (serenoa repens). Current Outpatient Prescriptions on File Prior to  Visit  Medication Sig Dispense Refill  . amoxicillin (AMOXIL) 500 MG capsule Take 500 mg by mouth as directed. FOR DENTAL WORK ONLY      . aspirin EC 81 MG tablet Take 81 mg by mouth daily.        Marland Kitchen glucosamine-chondroitin 500-400 MG tablet Take 2 tablets by mouth.        Marland Kitchen LOVAZA 1 G capsule TAKE 2 CAPSULES (2 G TOTAL) BY MOUTH 2 (TWO) TIMES DAILY.  120 capsule  0  . meloxicam (MOBIC) 15 MG tablet Take 7.5 mg by mouth daily.      . Multiple Vitamins-Minerals (CENTRUM SILVER PO) Take 1 tablet by mouth daily.        . Saw Palmetto, Serenoa repens, (SAW PALMETTO PO) Take 2 tablets by mouth daily.        No current facility-administered medications on file prior to visit.   He is allergic to codeine and tape..  Review of Systems Pertinent items are noted in HPI.    Objective:    BP 120/70  Pulse 65  Temp(Src) 98.3 F (36.8 C) (Oral)  Wt 204 lb (92.534 kg)  BMI 30.11 kg/m2  SpO2 96% General appearance: alert, cooperative, appears stated age and no distress Throat: lips, mucosa, and tongue normal; teeth and gums normal Neck: no adenopathy, no carotid bruit, no JVD, supple, symmetrical, trachea midline and thyroid not enlarged, symmetric, no tenderness/mass/nodules Lungs: clear to auscultation bilaterally Heart: regular rate and rhythm, S1, S2 normal, no murmur, click, rub or gallop  Lab Review Lab Results  Component Value Date  CHOL 264* 02/26/2012   CHOL 292* 12/06/2011   CHOL 279* 06/05/2010   HDL 47.40 02/26/2012   HDL 54.30 12/06/2011   HDL 49.10 06/05/2010   LDLDIRECT 189.2 02/26/2012   LDLDIRECT 199.9 12/06/2011   LDLDIRECT 222.4 06/05/2010      Assessment:    Dyslipidemia as detailed above with 0 CHD risk factors using NCEP scheme above.  Target levels for LDL are: < 100 mg/dl (CHD or "CHD risk equivalent" is present)  Explained to the patient the respective contributions of genetics, diet, and exercise to lipid levels and the use of medication in severe cases which do  not respond to lifestyle alteration. The patient's interest and motivation in making lifestyle changes seems good.    Plan:    The following changes are planned for the next 6 months, at which time the patient will return for repeat fasting lipids:  1. Dietary changes: Increase soluble fiber Plant sterols 2grams per day (e.g. Benecol): yes Reduce saturated fat, "trans" monounsaturated fatty acids, and cholesterol 2. Exercise changes:  con't current exercise plan 3. Other treatment: Weight reduction (con't on current diet--he has lost weight since last ov) 4. Lipid-lowering medications: zocor  (Recommended by NCEP after 3-6 mos of dietary therapy & lifestyle modification,  except if CHD is present or LDL well above 190.) 5. Hormone replacement therapy (patient is a postmenopausal  woman): no 6. Screening for secondary causes of dyslipidemias: None indicated 7. Lipid screening for relatives: na 8. Follow up: 6 months.  Note: The majority of the visit was spent in counseling on the pathophysiology and treatment of dyslipidemias. The total face-to-face time was in excess of 20 minutes.

## 2012-08-25 NOTE — Assessment & Plan Note (Signed)
con't meds Check labs

## 2012-08-25 NOTE — Patient Instructions (Addendum)
Cholesterol Cholesterol is a white, waxy, fat-like protein needed by your body in small amounts. The liver makes all the cholesterol you need. It is carried from the liver by the blood through the blood vessels. Deposits (plaque) may build up on blood vessel walls. This makes the arteries narrower and stiffer. Plaque increases the risk for heart attack and stroke. You cannot feel your cholesterol level even if it is very high. The only way to know is by a blood test to check your lipid (fats) levels. Once you know your cholesterol levels, you should keep a record of the test results. Work with your caregiver to to keep your levels in the desired range. WHAT THE RESULTS MEAN:  Total cholesterol is a rough measure of all the cholesterol in your blood.  LDL is the so-called bad cholesterol. This is the type that deposits cholesterol in the walls of the arteries. You want this level to be low.  HDL is the good cholesterol because it cleans the arteries and carries the LDL away. You want this level to be high.  Triglycerides are fat that the body can either burn for energy or store. High levels are closely linked to heart disease. DESIRED LEVELS:  Total cholesterol below 200.  LDL below 100 for people at risk, below 70 for very high risk.  HDL above 50 is good, above 60 is best.  Triglycerides below 150. HOW TO LOWER YOUR CHOLESTEROL:  Diet.  Choose fish or white meat chicken and [name], roasted or baked. Limit fatty cuts of red meat, fried foods, and processed meats, such as sausage and lunch meat.  Eat lots of fresh fruits and vegetables. Choose whole grains, beans, pasta, potatoes and cereals.  Use only small amounts of olive, corn or canola oils. Avoid butter, mayonnaise, shortening or palm kernel oils. Avoid foods with trans-fats.  Use skim/nonfat milk and low-fat/nonfat yogurt and cheeses. Avoid whole milk, cream, ice cream, egg yolks and cheeses. Healthy desserts include angel food  cake, ginger snaps, animal crackers, hard candy, popsicles, and low-fat/nonfat frozen yogurt. Avoid pastries, cakes, pies and cookies.  Exercise.  A regular program helps decrease LDL and raises HDL.  Helps with weight control.  Do things that increase your activity level like gardening, walking, or taking the stairs.  Medication.  May be prescribed by your caregiver to help lowering cholesterol and the risk for heart disease.  You may need medicine even if your levels are normal if you have several risk factors. HOME CARE INSTRUCTIONS   Follow your diet and exercise programs as suggested by your caregiver.  Take medications as directed.  Have blood work done when your caregiver feels it is necessary. MAKE SURE YOU:   Understand these instructions.  Will watch your condition.  Will get help right away if you are not doing well or get worse. Document Released: 01/30/2001 Document Revised: 07/30/2011 Document Reviewed: 07/23/2007 [facility] Patient Information 2013 [place].

## 2012-09-03 ENCOUNTER — Other Ambulatory Visit: Payer: Self-pay

## 2012-09-03 MED ORDER — SIMVASTATIN 40 MG PO TABS: 40.0000 mg | ORAL_TABLET | Freq: Every day | ORAL | Status: DC

## 2013-02-24 ENCOUNTER — Encounter: Payer: Self-pay | Admitting: Family Medicine

## 2013-02-24 ENCOUNTER — Ambulatory Visit (INDEPENDENT_AMBULATORY_CARE_PROVIDER_SITE_OTHER): Payer: Managed Care, Other (non HMO) | Admitting: Family Medicine

## 2013-02-24 ENCOUNTER — Telehealth: Payer: Self-pay | Admitting: Family Medicine

## 2013-02-24 VITALS — BP 122/76 | HR 72 | Temp 98.2°F | Ht 67.75 in | Wt 211.2 lb

## 2013-02-24 DIAGNOSIS — I2510 Atherosclerotic heart disease of native coronary artery without angina pectoris: Secondary | ICD-10-CM

## 2013-02-24 DIAGNOSIS — E785 Hyperlipidemia, unspecified: Secondary | ICD-10-CM

## 2013-02-24 DIAGNOSIS — M1990 Unspecified osteoarthritis, unspecified site: Secondary | ICD-10-CM

## 2013-02-24 DIAGNOSIS — E669 Obesity, unspecified: Secondary | ICD-10-CM | POA: Insufficient documentation

## 2013-02-24 LAB — BASIC METABOLIC PANEL
BUN: 17 mg/dL (ref 6–23)
Calcium: 9.4 mg/dL (ref 8.4–10.5)
GFR: 81.06 mL/min (ref >60.00)
Potassium: 4.1 mEq/L (ref 3.5–5.1)
Sodium: 138 mEq/L (ref 135–145)

## 2013-02-24 LAB — LIPID PANEL
Cholesterol: 273 mg/dL — ABNORMAL HIGH (ref 0–200)
HDL: 47.5 mg/dL (ref >39.00)
Total CHOL/HDL Ratio: 6
VLDL: 31.2 mg/dL (ref 0.0–40.0)

## 2013-02-24 LAB — HEPATIC FUNCTION PANEL
AST: 39 U/L — ABNORMAL HIGH (ref 0–37)
Total Bilirubin: 0.9 mg/dL (ref 0.3–1.2)

## 2013-02-24 LAB — LDL CHOLESTEROL, DIRECT: Direct LDL: 200.3 mg/dL

## 2013-02-24 MED ORDER — COLESEVELAM HCL 3.75 G PO PACK: PACK | ORAL | Status: DC

## 2013-02-24 MED ORDER — MELOXICAM 15 MG PO TABS: ORAL_TABLET | ORAL | Status: DC

## 2013-02-24 NOTE — Telephone Encounter (Signed)
Patient is coming in for labs in Dec to see how well he is doing on his rx Colesevelam HCl (WELCHOL) 3.75 G PACK. Please advise on orders.

## 2013-02-24 NOTE — Telephone Encounter (Signed)
Patient had labs done today, I will enter the orders once Dr.Lowne receives labs and advise what she wants in 3-6 mos.      KP

## 2013-02-24 NOTE — Progress Notes (Signed)
  Subjective:    [name] is a 62 y.o. male here for follow up of dyslipidemia. The patient does not use medications that may worsen dyslipidemias (corticosteroids, progestins, anabolic steroids, diuretics, beta-blockers, amiodarone, cyclosporine, olanzapine). The patient exercises infrequently. The patient is known to have coexisting coronary artery disease.   Cardiac Risk Factors Age > 45-male, > 55-male:  YES  +1  Smoking:   NO  Sig. family hx of CHD*:  no  Hypertension:   NO  Diabetes:   NO  HDL < 35:   NO  HDL > 80:   NO  Total: 1   *- Sig. family h/o CHD per NCEP = MI or sudden death at <55yo in  father or other 47st-degree male relative, or <65yo in mother or  other 1st-degree male relative  The following portions of the patient's history were reviewed and updated as appropriate: allergies, current medications, past family history, past medical history, past social history, past surgical history and problem list.  Review of Systems Pertinent items are noted in HPI.    Objective:    BP 122/76  Pulse 72  Temp(Src) 98.2 F (36.8 C) (Oral)  Ht 5' 7.75" (1.721 m)  Wt 211 lb 3.2 oz (95.8 kg)  BMI 32.34 kg/m2  SpO2 98% General appearance: alert, cooperative, appears stated age and no distress Neck: no adenopathy, no carotid bruit, no JVD, supple, symmetrical, trachea midline and thyroid not enlarged, symmetric, no tenderness/mass/nodules Lungs: clear to auscultation bilaterally Heart: regular rate and rhythm, S1, S2 normal, no murmur, click, rub or gallop Extremities: extremities normal, atraumatic, no cyanosis or edema  Lab Review Lab Results  Component Value Date   CHOL 244* 08/25/2012   CHOL 264* 02/26/2012   CHOL 292* 12/06/2011   HDL 47.40 08/25/2012   HDL 47.40 02/26/2012   HDL 54.30 12/06/2011   LDLDIRECT 166.5 08/25/2012   LDLDIRECT 189.2 02/26/2012   LDLDIRECT 199.9 12/06/2011      Assessment:    Dyslipidemia as detailed above with 1 CHD risk factors  using NCEP scheme above.  Target levels for LDL are: < 100 mg/dl (CHD or "CHD risk equivalent" is present)  Explained to the patient the respective contributions of genetics, diet, and exercise to lipid levels and the use of medication in severe cases which do not respond to lifestyle alteration. The patient's interest and motivation in making lifestyle changes seems fair.    Plan:    The following changes are planned for the next 6 months, at which time the patient will return for repeat fasting lipids:  1. Dietary changes: pt will work on diet and exercise 2. Exercise changes:  Pt to inc exercise 3. Other treatment: Weight reduction (-) 4. Lipid-lowering medications: unable to tolerate statins--- pt to try welchol  (Recommended by NCEP after 3-6 mos of dietary therapy & lifestyle modification,  except if CHD is present or LDL well above 190.) 5. Hormone replacement therapy (patient is a postmenopausal  woman): na 6. Screening for secondary causes of dyslipidemias: None indicated 7. Lipid screening for relatives: na 8. Follow up: 3 months.  Note: The majority of the visit was spent in counseling on the pathophysiology and treatment of dyslipidemias. The total face-to-face time was in excess of 25 minutes.

## 2013-02-24 NOTE — Assessment & Plan Note (Signed)
Check labs Cont' meds

## 2013-02-24 NOTE — Patient Instructions (Addendum)

## 2013-03-04 ENCOUNTER — Ambulatory Visit: Payer: Managed Care, Other (non HMO) | Admitting: Cardiology

## 2013-03-18 ENCOUNTER — Encounter: Payer: Self-pay | Admitting: Cardiology

## 2013-03-18 ENCOUNTER — Ambulatory Visit (INDEPENDENT_AMBULATORY_CARE_PROVIDER_SITE_OTHER): Payer: Managed Care, Other (non HMO) | Admitting: Cardiology

## 2013-03-18 ENCOUNTER — Other Ambulatory Visit: Payer: Self-pay | Admitting: Family Medicine

## 2013-03-18 VITALS — BP 134/82 | HR 69 | Wt 213.0 lb

## 2013-03-18 DIAGNOSIS — E7800 Pure hypercholesterolemia, unspecified: Secondary | ICD-10-CM

## 2013-03-18 DIAGNOSIS — I2510 Atherosclerotic heart disease of native coronary artery without angina pectoris: Secondary | ICD-10-CM

## 2013-03-18 DIAGNOSIS — Z9889 Other specified postprocedural states: Secondary | ICD-10-CM

## 2013-03-18 NOTE — Patient Instructions (Signed)
Your physician wants you to follow-up in: ONE YEAR WITH DR CRENSHAW You will receive a reminder letter in the mail two months in advance. If you don't receive a letter, please call our office to schedule the follow-up appointment.  

## 2013-03-18 NOTE — Assessment & Plan Note (Signed)
Patient has not tolerated statins in the past and they have caused increased LFTs. Followup primary care.

## 2013-03-18 NOTE — Assessment & Plan Note (Signed)
Continue aspirin. Intolerant to statins.

## 2013-03-18 NOTE — Assessment & Plan Note (Signed)
Continue SBE prophylaxis.

## 2013-03-18 NOTE — Assessment & Plan Note (Signed)
Most recent carotid Dopplers were normal.

## 2013-03-18 NOTE — Progress Notes (Signed)
HPI: Pleasant gentleman who has a history of mitral valve repair secondary to severe mitral regurgitation in February 2009. Note preoperative catheterization showed a 40% left main, 30% LAD and 30% right coronary artery. He has also had atrial flutter ablation in March 2009. Postoperatively, he had recurrent fevers that were felt secondary to a postsurgical inflammatory state. Carotid dopplers in August of 2011 were normal. Last echocardiogram in Oct 2013 showed normal LV function, s/p MV repair, mild MR and moderate to severe LAE. Patient has had elevated LFTs with statins in the past. I last saw him in Sept 2013. Since then, the patient has dyspnea with more extreme activities but not with routine activities. It is relieved with rest. It is not associated with chest pain. There is no orthopnea, PND or pedal edema. There is no syncope or palpitations. There is no exertional chest pain.   Current Outpatient Prescriptions  Medication Sig Dispense Refill  . amoxicillin (AMOXIL) 500 MG capsule Take 500 mg by mouth as directed. FOR DENTAL WORK ONLY      . aspirin EC 81 MG tablet Take 81 mg by mouth daily.        Marland Kitchen glucosamine-chondroitin 500-400 MG tablet Take 2 tablets by mouth daily.       . Melatonin 3 MG TABS Take 1 tablet by mouth daily.      . meloxicam (MOBIC) 15 MG tablet 1/2  - 1 po qd prn  30 tablet  5  . Multiple Vitamins-Minerals (CENTRUM SILVER PO) Take 1 tablet by mouth daily.        Marland Kitchen omega-3 acid ethyl esters (LOVAZA) 1 G capsule Take 2 capsules (2 g total) by mouth 2 (two) times daily.  120 capsule  5  . Saw Palmetto, Serenoa repens, (SAW PALMETTO PO) Take 2 tablets by mouth daily.        No current facility-administered medications for this visit.     Past Medical History  Diagnosis Date  . MVP (mitral valve prolapse)   . MR (mitral regurgitation)     severe; mitral valve repair in march 2009  . Atrial flutter     s/p ablation  . Hyperlipidemia   . CAD (coronary  artery disease)     cath 06/2007 40% LM  . CVD (cerebrovascular disease)     59% right ICA, 49% left ICA; h/oTIA; fu study 8/11 with normal carotids  . History of colonoscopy     Past Surgical History  Procedure Laterality Date  . Tonsillectomy  1960  . Mitral valve repair  06/2007    History   Social History  . Marital Status: Married    Spouse Name: N/A    Number of Children: N/A  . Years of Education: N/A   Occupational History  . [employer] and [employer]     works from home   Social History Main Topics  . Smoking status: Never Smoker   . Smokeless tobacco: Never Used  . Alcohol Use: Yes     Comment: occasional 1-2 wine drinks a month  . Drug Use: No  . Sexual Activity: Yes    Partners: Female   Other Topics Concern  . Not on file   Social History Narrative   Exercise--  Total gym    ROS: Knee arthralgias but no fevers or chills, productive cough, hemoptysis, dysphasia, odynophagia, melena, hematochezia, dysuria, hematuria, rash, seizure activity, orthopnea, PND, pedal edema, claudication. Remaining systems are negative.  Physical Exam: Well-developed well-nourished in  no acute distress.  Skin is warm and dry.  HEENT is normal.  Neck is supple.  Chest is clear to auscultation with normal expansion.  Cardiovascular exam is regular rate and rhythm.  Abdominal exam nontender or distended. No masses palpated. Extremities show no edema. neuro grossly intact  ECG sinus rhythm at a rate of 69. First degree AV block. RV conduction delay.

## 2013-03-26 ENCOUNTER — Other Ambulatory Visit: Payer: Self-pay

## 2013-04-28 ENCOUNTER — Other Ambulatory Visit: Payer: Managed Care, Other (non HMO)

## 2013-08-24 ENCOUNTER — Ambulatory Visit (INDEPENDENT_AMBULATORY_CARE_PROVIDER_SITE_OTHER): Payer: Managed Care, Other (non HMO) | Admitting: Family Medicine

## 2013-08-24 ENCOUNTER — Other Ambulatory Visit: Payer: Self-pay | Admitting: Family Medicine

## 2013-08-24 ENCOUNTER — Encounter: Payer: Self-pay | Admitting: Family Medicine

## 2013-08-24 VITALS — BP 122/84 | HR 82 | Temp 98.1°F | Ht 68.75 in | Wt 221.0 lb

## 2013-08-24 DIAGNOSIS — M25561 Pain in right knee: Secondary | ICD-10-CM

## 2013-08-24 DIAGNOSIS — R7989 Other specified abnormal findings of blood chemistry: Secondary | ICD-10-CM

## 2013-08-24 DIAGNOSIS — M25569 Pain in unspecified knee: Secondary | ICD-10-CM

## 2013-08-24 DIAGNOSIS — R739 Hyperglycemia, unspecified: Secondary | ICD-10-CM

## 2013-08-24 DIAGNOSIS — E785 Hyperlipidemia, unspecified: Secondary | ICD-10-CM

## 2013-08-24 DIAGNOSIS — R7309 Other abnormal glucose: Secondary | ICD-10-CM

## 2013-08-24 DIAGNOSIS — R945 Abnormal results of liver function studies: Principal | ICD-10-CM

## 2013-08-24 DIAGNOSIS — M25562 Pain in left knee: Secondary | ICD-10-CM

## 2013-08-24 LAB — BASIC METABOLIC PANEL
BUN: 17 mg/dL (ref 6–23)
CO2: 25 meq/L (ref 19–32)
CREATININE: 1 mg/dL (ref 0.4–1.5)
Calcium: 9.5 mg/dL (ref 8.4–10.5)
Chloride: 103 mEq/L (ref 96–112)
GFR: 78.21 mL/min (ref >60.00)
Glucose, Bld: 100 mg/dL — ABNORMAL HIGH (ref 70–99)
Potassium: 4 mEq/L (ref 3.5–5.1)
Sodium: 138 mEq/L (ref 135–145)

## 2013-08-24 LAB — LIPID PANEL
CHOL/HDL RATIO: 6
Cholesterol: 277 mg/dL — ABNORMAL HIGH (ref 0–200)
HDL: 45.9 mg/dL (ref >39.00)
LDL Cholesterol: 195 mg/dL — ABNORMAL HIGH (ref 0–99)
Triglycerides: 183 mg/dL — ABNORMAL HIGH (ref 0.0–149.0)
VLDL: 36.6 mg/dL (ref 0.0–40.0)

## 2013-08-24 LAB — HEPATIC FUNCTION PANEL
ALT: 79 U/L — ABNORMAL HIGH (ref 0–53)
AST: 39 U/L — ABNORMAL HIGH (ref 0–37)
Albumin: 4.3 g/dL (ref 3.5–5.2)
Alkaline Phosphatase: 55 U/L (ref 39–117)
BILIRUBIN TOTAL: 1 mg/dL (ref 0.3–1.2)
Bilirubin, Direct: 0 mg/dL (ref 0.0–0.3)
TOTAL PROTEIN: 7.5 g/dL (ref 6.0–8.3)

## 2013-08-24 LAB — HEMOGLOBIN A1C: Hgb A1c MFr Bld: 5.9 % (ref 4.6–6.5)

## 2013-08-24 MED ORDER — CELECOXIB 200 MG PO CAPS: 200.0000 mg | ORAL_CAPSULE | Freq: Two times a day (BID) | ORAL | Status: DC

## 2013-08-24 MED ORDER — FENOFIBRATE MICRONIZED 90 MG PO CAPS: ORAL_CAPSULE | ORAL | Status: DC

## 2013-08-24 NOTE — Patient Instructions (Signed)
Cholesterol Cholesterol is a white, waxy, fat-like protein needed by your body in small amounts. The liver makes all the cholesterol you need. It is carried from the liver by the blood through the blood vessels. Deposits (plaque) may build up on blood vessel walls. This makes the arteries narrower and stiffer. Plaque increases the risk for heart attack and stroke. You cannot feel your cholesterol level even if it is very high. The only way to know is by a blood test to check your lipid (fats) levels. Once you know your cholesterol levels, you should keep a record of the test results. Work with your caregiver to to keep your levels in the desired range. WHAT THE RESULTS MEAN:  Total cholesterol is a rough measure of all the cholesterol in your blood.  LDL is the so-called bad cholesterol. This is the type that deposits cholesterol in the walls of the arteries. You want this level to be low.  HDL is the good cholesterol because it cleans the arteries and carries the LDL away. You want this level to be high.  Triglycerides are fat that the body can either burn for energy or store. High levels are closely linked to heart disease. DESIRED LEVELS:  Total cholesterol below 200.  LDL below 100 for people at risk, below 70 for very high risk.  HDL above 50 is good, above 60 is best.  Triglycerides below 150. HOW TO LOWER YOUR CHOLESTEROL:  Diet.  Choose fish or white meat chicken and [name], roasted or baked. Limit fatty cuts of red meat, fried foods, and processed meats, such as sausage and lunch meat.  Eat lots of fresh fruits and vegetables. Choose whole grains, beans, pasta, potatoes and cereals.  Use only small amounts of olive, corn or canola oils. Avoid butter, mayonnaise, shortening or palm kernel oils. Avoid foods with trans-fats.  Use skim/nonfat milk and low-fat/nonfat yogurt and cheeses. Avoid whole milk, cream, ice cream, egg yolks and cheeses. Healthy desserts include angel food  cake, ginger snaps, animal crackers, hard candy, popsicles, and low-fat/nonfat frozen yogurt. Avoid pastries, cakes, pies and cookies.  Exercise.  A regular program helps decrease LDL and raises HDL.  Helps with weight control.  Do things that increase your activity level like gardening, walking, or taking the stairs.  Medication.  May be prescribed by your caregiver to help lowering cholesterol and the risk for heart disease.  You may need medicine even if your levels are normal if you have several risk factors. HOME CARE INSTRUCTIONS   Follow your diet and exercise programs as suggested by your caregiver.  Take medications as directed.  Have blood work done when your caregiver feels it is necessary. MAKE SURE YOU:   Understand these instructions.  Will watch your condition.  Will get help right away if you are not doing well or get worse. Document Released: 01/30/2001 Document Revised: 07/30/2011 Document Reviewed: 02/18/2013 [facility] Patient Information 2014 [place], [place].

## 2013-08-24 NOTE — Progress Notes (Signed)
Patient ID: [name], male   DOB: 02/21/1951, 63 y.o.   MRN: [id number]   Subjective:    Patient ID: [name], male    DOB: 18-Jan-1951, 62 y.o.   MRN: [id number] HPI Pt here for f/u cholesterol.  Pt has been unable to tolerate statins---LFT elevate.  He did not tolerate the welchol---"it tore up my stomach".  Pt also sees cardiology. Pt c/o both knees started to bother him more.  He is bow legged and he feels like it is worsening.           Objective:    BP 122/84  Pulse 82  Temp(Src) 98.1 F (36.7 C) (Oral)  Ht 5' 8.75" (1.746 m)  Wt 221 lb (100.245 kg)  BMI 32.88 kg/m2  SpO2 95% General appearance: alert, cooperative, appears stated age and no distress Neck: no adenopathy, no carotid bruit, no JVD, supple, symmetrical, trachea midline and thyroid not enlarged, symmetric, no tenderness/mass/nodules Lungs: clear to auscultation bilaterally Heart: regular rate and rhythm, S1, S2 normal, no murmur, click, rub or gallop       Assessment & Plan:  1. Other and unspecified hyperlipidemia Check labs,   - Basic metabolic panel - Hepatic function panel - Lipid panel  1. Other and unspecified hyperlipidemia Check labs--- try antara 90 mg qd  - Basic metabolic panel - Hepatic function panel - Lipid panel - Hepatic function panel; Future  2. Hyperglycemia Check labs - Hemoglobin A1c  3. Knee pain, bilateral celebrex 200 mg 1po qd prn - Ambulatory referral to Orthopedic Surgery

## 2013-08-24 NOTE — Progress Notes (Signed)
Pre visit review using our clinic review tool, if applicable. No additional management support is needed unless otherwise documented below in the visit note. 

## 2013-08-28 ENCOUNTER — Telehealth: Payer: Self-pay | Admitting: *Deleted

## 2013-08-28 DIAGNOSIS — M25562 Pain in left knee: Principal | ICD-10-CM

## 2013-08-28 DIAGNOSIS — M25561 Pain in right knee: Secondary | ICD-10-CM

## 2013-08-28 MED ORDER — CELECOXIB 200 MG PO CAPS: 200.0000 mg | ORAL_CAPSULE | Freq: Two times a day (BID) | ORAL | Status: DC

## 2013-08-28 NOTE — Telephone Encounter (Signed)
Pt came by [facility] this morning to see if we had anymore samples of ANTARA.  We werer out on Side A, but Side B had 2 in the log book.  Per Katharine Look, she will go through and find them and pt can pick up Monday Morning.  Please send the prescription for celecoxib (CELEBREX) 200 MG capsule to [facility].  Thank you.  bw

## 2013-08-28 NOTE — Telephone Encounter (Signed)
Celebrex 200 po bid #[id number] [facility] to [facility].     KP

## 2013-08-31 ENCOUNTER — Telehealth: Payer: Self-pay | Admitting: *Deleted

## 2013-08-31 NOTE — Telephone Encounter (Signed)
Samples of Anatara 67m (x2)  provided to patient Lot # [id number] 05/2014

## 2013-09-07 ENCOUNTER — Other Ambulatory Visit: Payer: Self-pay | Admitting: Family Medicine

## 2013-09-07 ENCOUNTER — Other Ambulatory Visit (INDEPENDENT_AMBULATORY_CARE_PROVIDER_SITE_OTHER): Payer: Managed Care, Other (non HMO)

## 2013-09-07 DIAGNOSIS — R7989 Other specified abnormal findings of blood chemistry: Secondary | ICD-10-CM

## 2013-09-07 DIAGNOSIS — E785 Hyperlipidemia, unspecified: Secondary | ICD-10-CM

## 2013-09-07 DIAGNOSIS — Z8719 Personal history of other diseases of the digestive system: Secondary | ICD-10-CM

## 2013-09-07 DIAGNOSIS — R945 Abnormal results of liver function studies: Principal | ICD-10-CM

## 2013-09-07 LAB — CBC WITH DIFFERENTIAL/PLATELET
BASOS PCT: 0.7 % (ref 0.0–3.0)
Basophils Absolute: 0 10*3/uL (ref 0.0–0.1)
EOS PCT: 3.9 % (ref 0.0–5.0)
Eosinophils Absolute: 0.2 10*3/uL (ref 0.0–0.7)
HCT: 45.5 % (ref 39.0–52.0)
Hemoglobin: 15.6 g/dL (ref 13.0–17.0)
LYMPHS PCT: 34.4 % (ref 12.0–46.0)
Lymphs Abs: 1.8 10*3/uL (ref 0.7–4.0)
MCHC: 34.2 g/dL (ref 30.0–36.0)
MCV: 92.2 fl (ref 78.0–100.0)
MONOS PCT: 8.7 % (ref 3.0–12.0)
Monocytes Absolute: 0.5 10*3/uL (ref 0.1–1.0)
NEUTROS ABS: 2.8 10*3/uL (ref 1.4–7.7)
NEUTROS PCT: 52.3 % (ref 43.0–77.0)
Platelets: 196 10*3/uL (ref 150.0–400.0)
RBC: 4.94 Mil/uL (ref 4.22–5.81)
RDW: 12.2 % (ref 11.5–14.6)
WBC: 5.3 10*3/uL (ref 4.5–10.5)

## 2013-09-07 LAB — BASIC METABOLIC PANEL
BUN: 18 mg/dL (ref 6–23)
CHLORIDE: 104 meq/L (ref 96–112)
CO2: 25 mEq/L (ref 19–32)
Calcium: 9.4 mg/dL (ref 8.4–10.5)
Creatinine, Ser: 1.1 mg/dL (ref 0.4–1.5)
GFR: 76.48 mL/min (ref >60.00)
GLUCOSE: 103 mg/dL — AB (ref 70–99)
Potassium: 4.1 mEq/L (ref 3.5–5.1)
Sodium: 138 mEq/L (ref 135–145)

## 2013-09-07 LAB — HEPATIC FUNCTION PANEL
ALBUMIN: 4.3 g/dL (ref 3.5–5.2)
ALT: 77 U/L — AB (ref 0–53)
AST: 38 U/L — AB (ref 0–37)
Alkaline Phosphatase: 53 U/L (ref 39–117)
BILIRUBIN TOTAL: 1 mg/dL (ref 0.3–1.2)
Bilirubin, Direct: 0 mg/dL (ref 0.0–0.3)
Total Protein: 7.4 g/dL (ref 6.0–8.3)

## 2013-09-07 LAB — LIPID PANEL
CHOLESTEROL: 215 mg/dL — AB (ref 0–200)
HDL: 49.5 mg/dL (ref >39.00)
LDL CALC: 145 mg/dL — AB (ref 0–99)
Total CHOL/HDL Ratio: 4
Triglycerides: 105 mg/dL (ref 0.0–149.0)
VLDL: 21 mg/dL (ref 0.0–40.0)

## 2013-09-07 LAB — GAMMA GT: GGT: 25 U/L (ref 7–51)

## 2013-09-09 ENCOUNTER — Encounter: Payer: Self-pay | Admitting: Cardiology

## 2013-09-13 ENCOUNTER — Encounter: Payer: Self-pay | Admitting: Family Medicine

## 2013-09-13 DIAGNOSIS — E785 Hyperlipidemia, unspecified: Secondary | ICD-10-CM

## 2013-09-14 MED ORDER — FENOFIBRATE MICRONIZED 90 MG PO CAPS: ORAL_CAPSULE | ORAL | Status: DC

## 2013-10-05 ENCOUNTER — Other Ambulatory Visit: Payer: Self-pay | Admitting: Family Medicine

## 2014-02-08 ENCOUNTER — Other Ambulatory Visit: Payer: Self-pay | Admitting: Cardiology

## 2014-02-08 ENCOUNTER — Ambulatory Visit (INDEPENDENT_AMBULATORY_CARE_PROVIDER_SITE_OTHER): Payer: Managed Care, Other (non HMO) | Admitting: Cardiology

## 2014-02-08 ENCOUNTER — Encounter: Payer: Self-pay | Admitting: *Deleted

## 2014-02-08 ENCOUNTER — Encounter: Payer: Self-pay | Admitting: Cardiology

## 2014-02-08 VITALS — BP 132/90 | HR 70 | Ht 69.0 in | Wt 218.9 lb

## 2014-02-08 DIAGNOSIS — I059 Rheumatic mitral valve disease, unspecified: Secondary | ICD-10-CM

## 2014-02-08 DIAGNOSIS — Z9889 Other specified postprocedural states: Secondary | ICD-10-CM

## 2014-02-08 DIAGNOSIS — I2510 Atherosclerotic heart disease of native coronary artery without angina pectoris: Secondary | ICD-10-CM

## 2014-02-08 DIAGNOSIS — E7800 Pure hypercholesterolemia, unspecified: Secondary | ICD-10-CM

## 2014-02-08 NOTE — Assessment & Plan Note (Signed)
Continue aspirin 

## 2014-02-08 NOTE — Assessment & Plan Note (Signed)
Intolerant to statins. 

## 2014-02-08 NOTE — Progress Notes (Signed)
      HPI: FU mitral valve repair secondary to severe mitral regurgitation in February 2009. Note preoperative catheterization showed a 40% left main, 30% LAD and 30% right coronary artery. He has also had atrial flutter ablation in March 2009. Postoperatively, he had recurrent fevers that were felt secondary to a postsurgical inflammatory state. Carotid dopplers in August of 2011 were normal. Last echocardiogram in Oct 2013 showed normal LV function, s/p MV repair, mild MR and moderate to severe LAE. Patient has had elevated LFTs with statins in the past. Since I last saw him, No chest pain, palpitations or syncope. He notes some increased dyspnea but attributes this to knee arthralgias and difficulties with ambulation.   Current Outpatient Prescriptions  Medication Sig Dispense Refill  . amoxicillin (AMOXIL) 500 MG capsule Take 500 mg by mouth as directed. FOR DENTAL WORK ONLY      . aspirin EC 81 MG tablet Take 81 mg by mouth daily.        . Fenofibrate Micronized (ANTARA) 90 MG CAPS 1 po qd  90 capsule  1  . omega-3 acid ethyl esters (LOVAZA) 1 G capsule TAKE 2 CAPSULES (2 G TOTAL) BY MOUTH 2 (TWO) TIMES DAILY.  120 capsule  5  . Saw Palmetto, Serenoa repens, (SAW PALMETTO PO) Take 2 tablets by mouth daily.       . meloxicam (MOBIC) 15 MG tablet Take 15 mg by mouth daily.       No current facility-administered medications for this visit.     Past Medical History  Diagnosis Date  . MVP (mitral valve prolapse)   . MR (mitral regurgitation)     severe; mitral valve repair in march 2009  . Atrial flutter     s/p ablation  . Hyperlipidemia   . CAD (coronary artery disease)     cath 06/2007 40% LM  . CVD (cerebrovascular disease)     59% right ICA, 49% left ICA; h/oTIA; fu study 8/11 with normal carotids  . History of colonoscopy     Past Surgical History  Procedure Laterality Date  . Tonsillectomy  1960  . Mitral valve repair  06/2007    History   Social History  . Marital  Status: Married    Spouse Name: N/A    Number of Children: N/A  . Years of Education: N/A   Occupational History  . [employer] and [employer]     works from home   Social History Main Topics  . Smoking status: Never Smoker   . Smokeless tobacco: Never Used  . Alcohol Use: Yes     Comment: occasional 1-2 wine drinks a month  . Drug Use: No  . Sexual Activity: Yes    Partners: Female   Other Topics Concern  . Not on file   Social History Narrative   Exercise--  Total gym    ROS: Knee arthralgias but no fevers or chills, productive cough, hemoptysis, dysphasia, odynophagia, melena, hematochezia, dysuria, hematuria, rash, seizure activity, orthopnea, PND, pedal edema, claudication. Remaining systems are negative.  Physical Exam: Well-developed well-nourished in no acute distress.  Skin is warm and dry.  HEENT is normal.  Neck is supple.  Chest is clear to auscultation with normal expansion. Previous sternotomy Cardiovascular exam is regular rate and rhythm.  Abdominal exam nontender or distended. No masses palpated. Extremities show no edema. neuro grossly intact  ECG Sinus rhythm, first degree AV block

## 2014-02-08 NOTE — Assessment & Plan Note (Signed)
Some note of increased dyspnea. No murmur on examination. Repeat echocardiogram. Continue SBE prophylaxis.

## 2014-02-08 NOTE — Assessment & Plan Note (Signed)
Continue aspirin. Intolerant to statins.

## 2014-02-08 NOTE — Assessment & Plan Note (Signed)
Plan repeat laboratories including CBC, potassium, renal function and liver functions.

## 2014-02-08 NOTE — Patient Instructions (Signed)
Your physician wants you to follow-up in: [place] will receive a reminder letter in the mail two months in advance. If you don't receive a letter, please call our office to schedule the follow-up appointment.   Your physician has requested that you have an echocardiogram. Echocardiography is a painless test that uses sound waves to create images of your heart. It provides your doctor with information about the size and shape of your heart and how well your heart's chambers and valves are working. This procedure takes approximately one hour. There are no restrictions for this procedure.   Your physician recommends that you HAVE LAB WORK TODAY

## 2014-02-09 LAB — HEPATIC FUNCTION PANEL
ALBUMIN: 4.8 g/dL (ref 3.5–5.2)
ALT: 56 U/L — AB (ref 0–53)
AST: 30 U/L (ref 0–37)
Alkaline Phosphatase: 54 U/L (ref 39–117)
Bilirubin, Direct: 0.1 mg/dL (ref 0.0–0.3)
Indirect Bilirubin: 0.5 mg/dL (ref 0.2–1.2)
TOTAL PROTEIN: 7.1 g/dL (ref 6.0–8.3)
Total Bilirubin: 0.6 mg/dL (ref 0.2–1.2)

## 2014-02-09 LAB — CBC
HEMATOCRIT: 43.7 % (ref 39.0–52.0)
Hemoglobin: 14.9 g/dL (ref 13.0–17.0)
MCH: 31.1 pg (ref 26.0–34.0)
MCHC: 34.1 g/dL (ref 30.0–36.0)
MCV: 91.2 fL (ref 78.0–100.0)
Platelets: 219 10*3/uL (ref 150–400)
RBC: 4.79 MIL/uL (ref 4.22–5.81)
RDW: 12.9 % (ref 11.5–15.5)
WBC: 4.7 10*3/uL (ref 4.0–10.5)

## 2014-02-09 LAB — BASIC METABOLIC PANEL WITH GFR
BUN: 14 mg/dL (ref 6–23)
CO2: 22 mEq/L (ref 19–32)
CREATININE: 1.08 mg/dL (ref 0.50–1.35)
Calcium: 9.8 mg/dL (ref 8.4–10.5)
Chloride: 107 mEq/L (ref 96–112)
GFR, EST AFRICAN AMERICAN: 86 mL/min
GFR, EST NON AFRICAN AMERICAN: 74 mL/min
Glucose, Bld: 103 mg/dL — ABNORMAL HIGH (ref 70–99)
POTASSIUM: 4.5 meq/L (ref 3.5–5.3)
Sodium: 139 mEq/L (ref 135–145)

## 2014-02-09 LAB — LIPID PANEL
CHOLESTEROL: 194 mg/dL (ref 0–200)
HDL: 54 mg/dL (ref >39)
LDL Cholesterol: 119 mg/dL — ABNORMAL HIGH (ref 0–99)
Total CHOL/HDL Ratio: 3.6 Ratio
Triglycerides: 105 mg/dL (ref <150)
VLDL: 21 mg/dL (ref 0–40)

## 2014-02-09 LAB — TSH: TSH: 1.339 u[IU]/mL (ref 0.350–4.500)

## 2014-02-11 ENCOUNTER — Ambulatory Visit (INDEPENDENT_AMBULATORY_CARE_PROVIDER_SITE_OTHER): Payer: Managed Care, Other (non HMO) | Admitting: Family Medicine

## 2014-02-11 ENCOUNTER — Ambulatory Visit (HOSPITAL_COMMUNITY)
Admission: RE | Admit: 2014-02-11 | Discharge: 2014-02-11 | Disposition: A | Discharge status: Home / Self Care | Payer: Managed Care, Other (non HMO) | Source: Ambulatory Visit | Attending: Cardiovascular Disease | Admitting: Cardiovascular Disease

## 2014-02-11 ENCOUNTER — Encounter: Payer: Self-pay | Admitting: Family Medicine

## 2014-02-11 VITALS — BP 146/80 | HR 65 | Temp 97.9°F | Wt 216.8 lb

## 2014-02-11 DIAGNOSIS — D485 Neoplasm of uncertain behavior of skin: Secondary | ICD-10-CM

## 2014-02-11 DIAGNOSIS — I2510 Atherosclerotic heart disease of native coronary artery without angina pectoris: Secondary | ICD-10-CM | POA: Diagnosis not present

## 2014-02-11 DIAGNOSIS — I10 Essential (primary) hypertension: Secondary | ICD-10-CM

## 2014-02-11 DIAGNOSIS — R350 Frequency of micturition: Secondary | ICD-10-CM

## 2014-02-11 DIAGNOSIS — I059 Rheumatic mitral valve disease, unspecified: Secondary | ICD-10-CM | POA: Diagnosis not present

## 2014-02-11 DIAGNOSIS — D229 Melanocytic nevi, unspecified: Secondary | ICD-10-CM

## 2014-02-11 DIAGNOSIS — E785 Hyperlipidemia, unspecified: Secondary | ICD-10-CM | POA: Diagnosis not present

## 2014-02-11 LAB — POCT URINALYSIS DIPSTICK
BILIRUBIN UA: NEGATIVE
Blood, UA: NEGATIVE
GLUCOSE UA: NEGATIVE
KETONES UA: NEGATIVE
LEUKOCYTES UA: NEGATIVE
Nitrite, UA: NEGATIVE
PROTEIN UA: NEGATIVE
Spec Grav, UA: 1.025
Urobilinogen, UA: 0.2
pH, UA: 6

## 2014-02-11 NOTE — Progress Notes (Signed)
   Subjective:    Patient ID: [name], male    DOB: 1950/09/27, 63 y.o.   MRN: [id number]  HPI Pt here for f/u cholesterol and bp.  Labs were done by cardiology.. Pt c/o mole on scalp.    He also c/o urinary frequency at the end of the visit.     Review of Systems    as above  Objective:   Physical Exam  BP 146/80  Pulse 65  Temp(Src) 97.9 F (36.6 C) (Oral)  Wt 216 lb 12.8 oz (98.34 kg)  SpO2 97% General appearance: alert, cooperative, appears stated age and no distress Neck: no adenopathy, supple, symmetrical, trachea midline and thyroid not enlarged, symmetric, no tenderness/mass/nodules Lungs: clear to auscultation bilaterally Heart: S1, S2 normal Extremities: extremities normal, atraumatic, no cyanosis or edema  Lab Results  Component Value Date   CHOL 194 02/08/2014   CHOL 215* 09/07/2013   CHOL 277* 08/24/2013   Lab Results  Component Value Date   HDL 54 02/08/2014   HDL 49.50 09/07/2013   HDL 45.90 08/24/2013   Lab Results  Component Value Date   LDLCALC 119* 02/08/2014   LDLCALC 145* 09/07/2013   LDLCALC 195* 08/24/2013   Lab Results  Component Value Date   TRIG 105 02/08/2014   TRIG 105.0 09/07/2013   TRIG 183.0* 08/24/2013   Lab Results  Component Value Date   CHOLHDL 3.6 02/08/2014   CHOLHDL 4 09/07/2013   CHOLHDL 6 08/24/2013   Lab Results  Component Value Date   LDLDIRECT 200.3 02/24/2013   LDLDIRECT 166.5 08/25/2012   LDLDIRECT 189.2 02/26/2012        Assessment & Plan:  1. Suspicious nevus On scalp - Ambulatory referral to Dermatology  2. Essential hypertension Stable , con't meds  3. Other and unspecified hyperlipidemia Check labs, con't meds2  4. Urinary frequency Check UA,  Pt will need to rto for prostate check - POCT urinalysis dipstick

## 2014-02-11 NOTE — Progress Notes (Signed)
Pre visit review using our clinic review tool, if applicable. No additional management support is needed unless otherwise documented below in the visit note. 

## 2014-02-11 NOTE — Progress Notes (Signed)
2D Echo Performed 02/11/2014    Marygrace Drought, RCS

## 2014-02-11 NOTE — Patient Instructions (Signed)

## 2014-02-12 LAB — PSA: PSA: 1.7 ng/mL (ref <=4.00)

## 2014-02-22 ENCOUNTER — Ambulatory Visit: Payer: Managed Care, Other (non HMO) | Admitting: Family Medicine

## 2014-03-09 ENCOUNTER — Ambulatory Visit (INDEPENDENT_AMBULATORY_CARE_PROVIDER_SITE_OTHER): Payer: Managed Care, Other (non HMO)

## 2014-03-09 DIAGNOSIS — Z23 Encounter for immunization: Secondary | ICD-10-CM

## 2014-04-19 ENCOUNTER — Other Ambulatory Visit: Payer: Self-pay | Admitting: Family Medicine

## 2014-07-23 ENCOUNTER — Telehealth: Payer: Self-pay | Admitting: *Deleted

## 2014-07-23 NOTE — Telephone Encounter (Signed)
Patient is needing clearance for right TKA-MEDIAL & LATERAL W/WO PATELLA RESURFACING Will forward for dr Stanford Breed review

## 2014-07-23 NOTE — Telephone Encounter (Signed)
[place] for surgery Kirk Ruths

## 2014-07-23 NOTE — Telephone Encounter (Signed)
This note was hand delivered to [facility]opedic

## 2014-08-12 ENCOUNTER — Ambulatory Visit: Payer: Managed Care, Other (non HMO) | Admitting: Family Medicine

## 2014-08-27 ENCOUNTER — Telehealth: Payer: Self-pay | Admitting: Family Medicine

## 2014-08-27 NOTE — Telephone Encounter (Signed)
Pre visit letter sent

## 2014-09-16 ENCOUNTER — Telehealth: Payer: Self-pay | Admitting: *Deleted

## 2014-09-16 ENCOUNTER — Encounter: Payer: Self-pay | Admitting: *Deleted

## 2014-09-16 NOTE — Telephone Encounter (Signed)
Spoke with pt. previsit done

## 2014-09-16 NOTE — Telephone Encounter (Signed)
Unable to reach.

## 2014-09-17 ENCOUNTER — Encounter: Payer: Self-pay | Admitting: Family Medicine

## 2014-09-17 ENCOUNTER — Ambulatory Visit (INDEPENDENT_AMBULATORY_CARE_PROVIDER_SITE_OTHER): Payer: Managed Care, Other (non HMO) | Admitting: Family Medicine

## 2014-09-17 VITALS — BP 146/90 | HR 77 | Temp 98.1°F | Ht 69.0 in | Wt 217.2 lb

## 2014-09-17 DIAGNOSIS — Z01810 Encounter for preprocedural cardiovascular examination: Secondary | ICD-10-CM

## 2014-09-17 DIAGNOSIS — Z9889 Other specified postprocedural states: Secondary | ICD-10-CM

## 2014-09-17 DIAGNOSIS — Z23 Encounter for immunization: Secondary | ICD-10-CM | POA: Diagnosis not present

## 2014-09-17 DIAGNOSIS — I080 Rheumatic disorders of both mitral and aortic valves: Secondary | ICD-10-CM

## 2014-09-17 DIAGNOSIS — Z0000 Encounter for general adult medical examination without abnormal findings: Secondary | ICD-10-CM | POA: Insufficient documentation

## 2014-09-17 DIAGNOSIS — E7800 Pure hypercholesterolemia, unspecified: Secondary | ICD-10-CM

## 2014-09-17 DIAGNOSIS — Z01818 Encounter for other preprocedural examination: Secondary | ICD-10-CM

## 2014-09-17 LAB — CBC WITH DIFFERENTIAL/PLATELET
BASOS ABS: 0 10*3/uL (ref 0.0–0.1)
Basophils Relative: 0 % (ref 0–1)
EOS PCT: 2 % (ref 0–5)
Eosinophils Absolute: 0.1 10*3/uL (ref 0.0–0.7)
HCT: 42.8 % (ref 39.0–52.0)
HEMOGLOBIN: 14.8 g/dL (ref 13.0–17.0)
LYMPHS ABS: 2 10*3/uL (ref 0.7–4.0)
Lymphocytes Relative: 27 % (ref 12–46)
MCH: 31.2 pg (ref 26.0–34.0)
MCHC: 34.6 g/dL (ref 30.0–36.0)
MCV: 90.1 fL (ref 78.0–100.0)
MONO ABS: 0.7 10*3/uL (ref 0.1–1.0)
MPV: 11.1 fL (ref 8.6–12.4)
Monocytes Relative: 10 % (ref 3–12)
Neutro Abs: 4.5 10*3/uL (ref 1.7–7.7)
Neutrophils Relative %: 61 % (ref 43–77)
Platelets: 230 10*3/uL (ref 150–400)
RBC: 4.75 MIL/uL (ref 4.22–5.81)
RDW: 13.2 % (ref 11.5–15.5)
WBC: 7.4 10*3/uL (ref 4.0–10.5)

## 2014-09-17 LAB — LIPID PANEL
CHOL/HDL RATIO: 4 ratio
CHOLESTEROL: 205 mg/dL — AB (ref 0–200)
HDL: 51 mg/dL (ref >=40)
LDL CALC: 133 mg/dL — AB (ref 0–99)
TRIGLYCERIDES: 107 mg/dL (ref <150)
VLDL: 21 mg/dL (ref 0–40)

## 2014-09-17 LAB — POCT URINALYSIS DIPSTICK
BILIRUBIN UA: NEGATIVE
Blood, UA: NEGATIVE
Glucose, UA: NEGATIVE
KETONES UA: NEGATIVE
Leukocytes, UA: NEGATIVE
NITRITE UA: NEGATIVE
PROTEIN UA: NEGATIVE
Spec Grav, UA: 1.025
Urobilinogen, UA: NEGATIVE
pH, UA: 6

## 2014-09-17 LAB — HEPATIC FUNCTION PANEL
ALT: 56 U/L — ABNORMAL HIGH (ref 0–53)
AST: 29 U/L (ref 0–37)
Albumin: 4.8 g/dL (ref 3.5–5.2)
Alkaline Phosphatase: 59 U/L (ref 39–117)
BILIRUBIN INDIRECT: 0.8 mg/dL (ref 0.2–1.2)
Bilirubin, Direct: 0.2 mg/dL (ref 0.0–0.3)
TOTAL PROTEIN: 7.5 g/dL (ref 6.0–8.3)
Total Bilirubin: 1 mg/dL (ref 0.2–1.2)

## 2014-09-17 LAB — BASIC METABOLIC PANEL
BUN: 19 mg/dL (ref 6–23)
CALCIUM: 9.6 mg/dL (ref 8.4–10.5)
CO2: 19 mEq/L (ref 19–32)
Chloride: 102 mEq/L (ref 96–112)
Creat: 0.98 mg/dL (ref 0.50–1.35)
Glucose, Bld: 84 mg/dL (ref 70–99)
Potassium: 3.9 mEq/L (ref 3.5–5.3)
Sodium: 135 mEq/L (ref 135–145)

## 2014-09-17 LAB — TSH: TSH: 1.439 u[IU]/mL (ref 0.350–4.500)

## 2014-09-17 NOTE — Addendum Note (Signed)
Addended by: Modena Morrow D on: 09/17/2014 03:24 PM   Modules accepted: Orders

## 2014-09-17 NOTE — Progress Notes (Signed)
Pre visit review using our clinic review tool, if applicable. No additional management support is needed unless otherwise documented below in the visit note. 

## 2014-09-17 NOTE — Addendum Note (Signed)
Addended by: Elizabeth Sauer on: 09/17/2014 02:52 PM   Modules accepted: Orders

## 2014-09-17 NOTE — Assessment & Plan Note (Addendum)
Pt cleared for surgery Pt with no chest pain --- d./w cardiology --pt is ok to have surgery

## 2014-09-17 NOTE — Progress Notes (Signed)
Patient ID: [name], male    DOB: 31-Mar-1951  Age: 64 y.o. MRN: [id number]    Subjective:  Subjective HPI MELECIO CUETO presents for cpe and med clearance for a R knee replacement November 29, 2014.    Review of Systems  Constitutional: Negative.   HENT: Negative for congestion, ear pain, hearing loss, nosebleeds, postnasal drip, rhinorrhea, sinus pressure, sneezing and tinnitus.   Eyes: Negative for photophobia, discharge, itching and visual disturbance.  Respiratory: Negative.   Cardiovascular: Negative.   Gastrointestinal: Negative for abdominal pain, constipation, blood in stool, abdominal distention and anal bleeding.  Endocrine: Negative.   Genitourinary: Negative.   Musculoskeletal: Positive for joint swelling.  Skin: Negative.   Allergic/Immunologic: Negative.   Neurological: Negative for dizziness, weakness, light-headedness, numbness and headaches.  Psychiatric/Behavioral: Negative for suicidal ideas, confusion, sleep disturbance, dysphoric mood, decreased concentration and agitation. The patient is not nervous/anxious.     History Past Medical History  Diagnosis Date  . MVP (mitral valve prolapse)   . MR (mitral regurgitation)     severe; mitral valve repair in march 2009  . Atrial flutter     s/p ablation  . Hyperlipidemia   . CAD (coronary artery disease)     cath 06/2007 40% LM  . CVD (cerebrovascular disease)     59% right ICA, 49% left ICA; h/oTIA; fu study 8/11 with normal carotids  . History of colonoscopy   . Osteoarthritis     both knees    He has past surgical history that includes Tonsillectomy (1960); Mitral valve repair (06/2007); and Replacement total knee (Right).   His family history includes Colitis in his daughter; Crohn's disease in his daughter; Diabetes in an other family member; Hyperlipidemia in his father; Liver cancer in his father; Liver disease in his father. There is no history of Colon cancer, Esophageal cancer, Rectal cancer, or  Stomach cancer.He reports that he has never smoked. He has never used smokeless tobacco. He reports that he drinks alcohol. He reports that he does not use illicit drugs.  Current Outpatient Prescriptions on File Prior to Visit  Medication Sig Dispense Refill  . amoxicillin (AMOXIL) 500 MG capsule Take 500 mg by mouth as directed. FOR DENTAL WORK ONLY    . ANTARA 90 MG CAPS TAKE ONE CAPSULE BY MOUTH EVERY DAY 90 capsule 1  . aspirin EC 81 MG tablet Take 81 mg by mouth daily.      . meloxicam (MOBIC) 15 MG tablet Take 15 mg by mouth daily.    Marland Kitchen omega-3 acid ethyl esters (LOVAZA) 1 G capsule TAKE 2 CAPSULES (2 G TOTAL) BY MOUTH 2 (TWO) TIMES DAILY. 120 capsule 5  . Saw Palmetto, Serenoa repens, (SAW PALMETTO PO) Take 2 tablets by mouth daily.      No current facility-administered medications on file prior to visit.     Objective:  Objective Physical Exam  Constitutional: He is oriented to person, place, and time. Vital signs are normal. He appears well-developed and well-nourished. He is sleeping.  HENT:  Head: Normocephalic and atraumatic.  Mouth/Throat: Oropharynx is clear and moist.  Eyes: EOM are normal. Pupils are equal, round, and reactive to light.  Neck: Normal range of motion. Neck supple. No thyromegaly present.  Cardiovascular: Normal rate and regular rhythm.   No murmur heard. Pulmonary/Chest: Effort normal and breath sounds normal. No respiratory distress. He has no wheezes. He has no rales. He exhibits no tenderness.  Musculoskeletal: He exhibits no edema.  Right knee: He exhibits swelling. Tenderness found.       Left knee: He exhibits swelling. Tenderness found.  Neurological: He is alert and oriented to person, place, and time.  Skin: Skin is warm and dry.  Psychiatric: He has a normal mood and affect. His behavior is normal. Judgment and thought content normal.   BP 148/98 mmHg  Pulse 77  Temp(Src) 98.1 F (36.7 C) (Oral)  Ht 5' 9"  (1.753 m)  Wt 217 lb 3.2  oz (98.521 kg)  BMI 32.06 kg/m2  SpO2 94% Wt Readings from Last 3 Encounters:  09/17/14 217 lb 3.2 oz (98.521 kg)  02/11/14 216 lb 12.8 oz (98.34 kg)  02/08/14 218 lb 14.4 oz (99.292 kg)     Lab Results  Component Value Date   WBC 4.7 02/08/2014   HGB 14.9 02/08/2014   HCT 43.7 02/08/2014   PLT 219 02/08/2014   GLUCOSE 103* 02/08/2014   CHOL 194 02/08/2014   TRIG 105 02/08/2014   HDL 54 02/08/2014   LDLDIRECT 200.3 02/24/2013   LDLCALC 119* 02/08/2014   ALT 56* 02/08/2014   AST 30 02/08/2014   NA 139 02/08/2014   K 4.5 02/08/2014   CL 107 02/08/2014   CREATININE 1.08 02/08/2014   BUN 14 02/08/2014   CO2 22 02/08/2014   TSH 1.339 02/08/2014   PSA 1.70 02/08/2014   INR 2.3* 08/11/2007   HGBA1C 5.9 08/24/2013   EKG--no change from previous No results found.   Assessment & Plan:  Plan I am having Mr. Littlejohn maintain his aspirin EC, (Saw Palmetto, Serenoa repens, (SAW PALMETTO PO)), amoxicillin, meloxicam, omega-3 acid ethyl esters, ANTARA, glucosamine-chondroitin, and Multiple Vitamins-Minerals (CENTRUM SILVER PO).  Meds ordered this encounter  Medications  . glucosamine-chondroitin 500-400 MG tablet    Sig: Take 1 tablet by mouth 3 (three) times daily.  . Multiple Vitamins-Minerals (CENTRUM SILVER PO)    Sig: Take 1 tablet by mouth daily.    Problem List Items Addressed This Visit    HYPERLIPIDEMIA, FAMILIAL    con't antara Check labs      MITRAL REGURGITATION, SEVERE   Pre-op examination    Pt medically cleared --- pt to be cleared by cardiology as well      S/P mitral valve repair    F/u cardiology       Other Visit Diagnoses    Preventative health care    -  Primary    Relevant Orders    Basic metabolic panel    CBC with Differential/Platelet    Hepatic function panel    Lipid panel    POCT urinalysis dipstick    PSA    TSH    Pre-operative cardiovascular examination        Relevant Orders    EKG 12-Lead (Completed)       Follow-up:  Return in about 6 months (around 03/19/2015), or if symptoms worsen or fail to improve.  Garnet Koyanagi, DO

## 2014-09-17 NOTE — Patient Instructions (Signed)
Preventive Care for Adults A healthy lifestyle and preventive care can promote health and wellness. Preventive health guidelines for men include the following key practices:  A routine yearly physical is a good way to check with your health care provider about your health and preventative screening. It is a chance to share any concerns and updates on your health and to receive a thorough exam.  Visit your dentist for a routine exam and preventative care every 6 months. Brush your teeth twice a day and floss once a day. Good oral hygiene prevents tooth decay and gum disease.  The frequency of eye exams is based on your age, health, family medical history, use of contact lenses, and other factors. Follow your health care provider's recommendations for frequency of eye exams.  Eat a healthy diet. Foods such as vegetables, fruits, whole grains, low-fat dairy products, and lean protein foods contain the nutrients you need without too many calories. Decrease your intake of foods high in solid fats, added sugars, and salt. Eat the right amount of calories for you.Get information about a proper diet from your health care provider, if necessary.  Regular physical exercise is one of the most important things you can do for your health. Most adults should get at least 150 minutes of moderate-intensity exercise (any activity that increases your heart rate and causes you to sweat) each week. In addition, most adults need muscle-strengthening exercises on 2 or more days a week.  Maintain a healthy weight. The body mass index (BMI) is a screening tool to identify possible weight problems. It provides an estimate of body fat based on height and weight. Your health care provider can find your BMI and can help you achieve or maintain a healthy weight.For adults 20 years and older:  A BMI below 18.5 is considered underweight.  A BMI of 18.5 to 24.9 is normal.  A BMI of 25 to 29.9 is considered overweight.  A BMI  of 30 and above is considered obese.  Maintain normal blood lipids and cholesterol levels by exercising and minimizing your intake of saturated fat. Eat a balanced diet with plenty of fruit and vegetables. Blood tests for lipids and cholesterol should begin at age 50 and be repeated every 5 years. If your lipid or cholesterol levels are high, you are over 50, or you are at high risk for heart disease, you may need your cholesterol levels checked more frequently.Ongoing high lipid and cholesterol levels should be treated with medicines if diet and exercise are not working.  If you smoke, find out from your health care provider how to quit. If you do not use tobacco, do not start.  Lung cancer screening is recommended for adults aged 73-80 years who are at high risk for developing lung cancer because of a history of smoking. A yearly low-dose CT scan of the lungs is recommended for people who have at least a 30-pack-year history of smoking and are a current smoker or have quit within the past 15 years. A pack year of smoking is smoking an average of 1 pack of cigarettes a day for 1 year (for example: 1 pack a day for 30 years or 2 packs a day for 15 years). Yearly screening should continue until the smoker has stopped smoking for at least 15 years. Yearly screening should be stopped for people who develop a health problem that would prevent them from having lung cancer treatment.  If you choose to drink alcohol, do not have more than  2 drinks per day. One drink is considered to be 12 ounces (355 mL) of beer, 5 ounces (148 mL) of wine, or 1.5 ounces (44 mL) of liquor.  Avoid use of street drugs. Do not share needles with anyone. Ask for help if you need support or instructions about stopping the use of drugs.  High blood pressure causes heart disease and increases the risk of stroke. Your blood pressure should be checked at least every 1-2 years. Ongoing high blood pressure should be treated with  medicines, if weight loss and exercise are not effective.  If you are 45-79 years old, ask your health care provider if you should take aspirin to prevent heart disease.  Diabetes screening involves taking a blood sample to check your fasting blood sugar level. This should be done once every 3 years, after age 45, if you are within normal weight and without risk factors for diabetes. Testing should be considered at a younger age or be carried out more frequently if you are overweight and have at least 1 risk factor for diabetes.  Colorectal cancer can be detected and often prevented. Most routine colorectal cancer screening begins at the age of 50 and continues through age 75. However, your health care provider may recommend screening at an earlier age if you have risk factors for colon cancer. On a yearly basis, your health care provider may provide home test kits to check for hidden blood in the stool. Use of a small camera at the end of a tube to directly examine the colon (sigmoidoscopy or colonoscopy) can detect the earliest forms of colorectal cancer. Talk to your health care provider about this at age 50, when routine screening begins. Direct exam of the colon should be repeated every 5-10 years through age 75, unless early forms of precancerous polyps or small growths are found.  People who are at an increased risk for hepatitis B should be screened for this virus. You are considered at high risk for hepatitis B if:  You were born in a country where hepatitis B occurs often. Talk with your health care provider about which countries are considered high risk.  Your parents were born in a high-risk country and you have not received a shot to protect against hepatitis B (hepatitis B vaccine).  You have HIV or AIDS.  You use needles to inject street drugs.  You live with, or have sex with, someone who has hepatitis B.  You are a man who has sex with other men (MSM).  You get hemodialysis  treatment.  You take certain medicines for conditions such as cancer, organ transplantation, and autoimmune conditions.  Hepatitis C blood testing is recommended for all people born from 1945 through 1965 and any individual with known risks for hepatitis C.  Practice safe sex. Use condoms and avoid high-risk sexual practices to reduce the spread of sexually transmitted infections (STIs). STIs include gonorrhea, chlamydia, syphilis, trichomonas, herpes, HPV, and human immunodeficiency virus (HIV). Herpes, HIV, and HPV are viral illnesses that have no cure. They can result in disability, cancer, and death.  If you are at risk of being infected with HIV, it is recommended that you take a prescription medicine daily to prevent HIV infection. This is called preexposure prophylaxis (PrEP). You are considered at risk if:  You are a man who has sex with other men (MSM) and have other risk factors.  You are a heterosexual man, are sexually active, and are at increased risk for HIV infection.    You take drugs by injection.  You are sexually active with a partner who has HIV.  Talk with your health care provider about whether you are at high risk of being infected with HIV. If you choose to begin PrEP, you should first be tested for HIV. You should then be tested every 3 months for as long as you are taking PrEP.  A one-time screening for abdominal aortic aneurysm (AAA) and surgical repair of large AAAs by ultrasound are recommended for men ages 32 to 67 years who are current or former smokers.  Healthy men should no longer receive prostate-specific antigen (PSA) blood tests as part of routine cancer screening. Talk with your health care provider about prostate cancer screening.  Testicular cancer screening is not recommended for adult males who have no symptoms. Screening includes self-exam, a health care provider exam, and other screening tests. Consult with your health care provider about any symptoms  you have or any concerns you have about testicular cancer.  Use sunscreen. Apply sunscreen liberally and repeatedly throughout the day. You should seek shade when your shadow is shorter than you. Protect yourself by wearing long sleeves, pants, a wide-brimmed hat, and sunglasses year round, whenever you are outdoors.  Once a month, do a whole-body skin exam, using a mirror to look at the skin on your back. Tell your health care provider about new moles, moles that have irregular borders, moles that are larger than a pencil eraser, or moles that have changed in shape or color.  Stay current with required vaccines (immunizations).  Influenza vaccine. All adults should be immunized every year.  Tetanus, diphtheria, and acellular pertussis (Td, Tdap) vaccine. An adult who has not previously received Tdap or who does not know his vaccine status should receive 1 dose of Tdap. This initial dose should be followed by tetanus and diphtheria toxoids (Td) booster doses every 10 years. Adults with an unknown or incomplete history of completing a 3-dose immunization series with Td-containing vaccines should begin or complete a primary immunization series including a Tdap dose. Adults should receive a Td booster every 10 years.  Varicella vaccine. An adult without evidence of immunity to varicella should receive 2 doses or a second dose if he has previously received 1 dose.  Human papillomavirus (HPV) vaccine. Males aged 68-21 years who have not received the vaccine previously should receive the 3-dose series. Males aged 22-26 years may be immunized. Immunization is recommended through the age of 6 years for any male who has sex with males and did not get any or all doses earlier. Immunization is recommended for any person with an immunocompromised condition through the age of 49 years if he did not get any or all doses earlier. During the 3-dose series, the second dose should be obtained 4-8 weeks after the first  dose. The third dose should be obtained 24 weeks after the first dose and 16 weeks after the second dose.  Zoster vaccine. One dose is recommended for adults aged 50 years or older unless certain conditions are present.  Measles, mumps, and rubella (MMR) vaccine. Adults born before [birth year] generally are considered immune to measles and mumps. Adults born in [birth year] or later should have 1 or more doses of MMR vaccine unless there is a contraindication to the vaccine or there is laboratory evidence of immunity to each of the three diseases. A routine second dose of MMR vaccine should be obtained at least 28 days after the first dose for students attending postsecondary  schools, health care workers, or international travelers. People who received inactivated measles vaccine or an unknown type of measles vaccine during 1963-1967 should receive 2 doses of MMR vaccine. People who received inactivated mumps vaccine or an unknown type of mumps vaccine before 1979 and are at high risk for mumps infection should consider immunization with 2 doses of MMR vaccine. Unvaccinated health care workers born before 1957 who lack laboratory evidence of measles, mumps, or rubella immunity or laboratory confirmation of disease should consider measles and mumps immunization with 2 doses of MMR vaccine or rubella immunization with 1 dose of MMR vaccine.  Pneumococcal 13-valent conjugate (PCV13) vaccine. When indicated, a person who is uncertain of his immunization history and has no record of immunization should receive the PCV13 vaccine. An adult aged 19 years or older who has certain medical conditions and has not been previously immunized should receive 1 dose of PCV13 vaccine. This PCV13 should be followed with a dose of pneumococcal polysaccharide (PPSV23) vaccine. The PPSV23 vaccine dose should be obtained at least 8 weeks after the dose of PCV13 vaccine. An adult aged 19 years or older who has certain medical conditions and  previously received 1 or more doses of PPSV23 vaccine should receive 1 dose of PCV13. The PCV13 vaccine dose should be obtained 1 or more years after the last PPSV23 vaccine dose.  Pneumococcal polysaccharide (PPSV23) vaccine. When PCV13 is also indicated, PCV13 should be obtained first. All adults aged 65 years and older should be immunized. An adult younger than age 65 years who has certain medical conditions should be immunized. Any person who resides in a nursing home or long-term care facility should be immunized. An adult smoker should be immunized. People with an immunocompromised condition and certain other conditions should receive both PCV13 and PPSV23 vaccines. People with human immunodeficiency virus (HIV) infection should be immunized as soon as possible after diagnosis. Immunization during chemotherapy or radiation therapy should be avoided. Routine use of PPSV23 vaccine is not recommended for American Indians, [place] Natives, or people younger than 65 years unless there are medical conditions that require PPSV23 vaccine. When indicated, people who have unknown immunization and have no record of immunization should receive PPSV23 vaccine. One-time revaccination 5 years after the first dose of PPSV23 is recommended for people aged 19-64 years who have chronic kidney failure, nephrotic syndrome, asplenia, or immunocompromised conditions. People who received 1-2 doses of PPSV23 before age 65 years should receive another dose of PPSV23 vaccine at age 65 years or later if at least 5 years have passed since the previous dose. Doses of PPSV23 are not needed for people immunized with PPSV23 at or after age 65 years.  Meningococcal vaccine. Adults with asplenia or persistent complement component deficiencies should receive 2 doses of quadrivalent meningococcal conjugate (MenACWY-D) vaccine. The doses should be obtained at least 2 months apart. Microbiologists working with certain meningococcal bacteria,  military recruits, people at risk during an outbreak, and people who travel to or live in countries with a high rate of meningitis should be immunized. A first-year college student up through age 21 years who is living in a residence hall should receive a dose if he did not receive a dose on or after his 16th birthday. Adults who have certain high-risk conditions should receive one or more doses of vaccine.  Hepatitis A vaccine. Adults who wish to be protected from this disease, have certain high-risk conditions, work with hepatitis A-infected animals, work in hepatitis A research labs, or   travel to or work in countries with a high rate of hepatitis A should be immunized. Adults who were previously unvaccinated and who anticipate close contact with an international adoptee during the first 60 days after arrival in the [place] from a country with a high rate of hepatitis A should be immunized.  Hepatitis B vaccine. Adults should be immunized if they wish to be protected from this disease, have certain high-risk conditions, may be exposed to blood or other infectious body fluids, are household contacts or sex partners of hepatitis B positive people, are clients or workers in certain care facilities, or travel to or work in countries with a high rate of hepatitis B.  Haemophilus influenzae type b (Hib) vaccine. A previously unvaccinated person with asplenia or sickle cell disease or having a scheduled splenectomy should receive 1 dose of Hib vaccine. Regardless of previous immunization, a recipient of a hematopoietic stem cell transplant should receive a 3-dose series 6-12 months after his successful transplant. Hib vaccine is not recommended for adults with HIV infection. Preventive Service / Frequency Ages 50 to 42  Blood pressure check.** / Every 1 to 2 years.  Lipid and cholesterol check.** / Every 5 years beginning at age 72.  Hepatitis C blood test.** / For any individual with known risks for  hepatitis C.  Skin self-exam. / Monthly.  Influenza vaccine. / Every year.  Tetanus, diphtheria, and acellular pertussis (Tdap, Td) vaccine.** / Consult your health care provider. 1 dose of Td every 10 years.  Varicella vaccine.** / Consult your health care provider.  HPV vaccine. / 3 doses over 6 months, if 70 or younger.  Measles, mumps, rubella (MMR) vaccine.** / You need at least 1 dose of MMR if you were born in 1957 or later. You may also need a second dose.  Pneumococcal 13-valent conjugate (PCV13) vaccine.** / Consult your health care provider.  Pneumococcal polysaccharide (PPSV23) vaccine.** / 1 to 2 doses if you smoke cigarettes or if you have certain conditions.  Meningococcal vaccine.** / 1 dose if you are age 54 to 84 years and a Market researcher living in a residence hall, or have one of several medical conditions. You may also need additional booster doses.  Hepatitis A vaccine.** / Consult your health care provider.  Hepatitis B vaccine.** / Consult your health care provider.  Haemophilus influenzae type b (Hib) vaccine.** / Consult your health care provider. Ages 43 to 82  Blood pressure check.** / Every 1 to 2 years.  Lipid and cholesterol check.** / Every 5 years beginning at age 64.  Lung cancer screening. / Every year if you are aged 33-80 years and have a 30-pack-year history of smoking and currently smoke or have quit within the past 15 years. Yearly screening is stopped once you have quit smoking for at least 15 years or develop a health problem that would prevent you from having lung cancer treatment.  Fecal occult blood test (FOBT) of stool. / Every year beginning at age 51 and continuing until age 76. You may not have to do this test if you get a colonoscopy every 10 years.  Flexible sigmoidoscopy** or colonoscopy.** / Every 5 years for a flexible sigmoidoscopy or every 10 years for a colonoscopy beginning at age 1 and continuing until age  95.  Hepatitis C blood test.** / For all people born from [birth year] through 1965 and any individual with known risks for hepatitis C.  Skin self-exam. / Monthly.  Influenza vaccine. / Every  year.  Tetanus, diphtheria, and acellular pertussis (Tdap/Td) vaccine.** / Consult your health care provider. 1 dose of Td every 10 years.  Varicella vaccine.** / Consult your health care provider.  Zoster vaccine.** / 1 dose for adults aged 53 years or older.  Measles, mumps, rubella (MMR) vaccine.** / You need at least 1 dose of MMR if you were born in 1957 or later. You may also need a second dose.  Pneumococcal 13-valent conjugate (PCV13) vaccine.** / Consult your health care provider.  Pneumococcal polysaccharide (PPSV23) vaccine.** / 1 to 2 doses if you smoke cigarettes or if you have certain conditions.  Meningococcal vaccine.** / Consult your health care provider.  Hepatitis A vaccine.** / Consult your health care provider.  Hepatitis B vaccine.** / Consult your health care provider.  Haemophilus influenzae type b (Hib) vaccine.** / Consult your health care provider. Ages 77 and over  Blood pressure check.** / Every 1 to 2 years.  Lipid and cholesterol check.**/ Every 5 years beginning at age 85.  Lung cancer screening. / Every year if you are aged 55-80 years and have a 30-pack-year history of smoking and currently smoke or have quit within the past 15 years. Yearly screening is stopped once you have quit smoking for at least 15 years or develop a health problem that would prevent you from having lung cancer treatment.  Fecal occult blood test (FOBT) of stool. / Every year beginning at age 33 and continuing until age 11. You may not have to do this test if you get a colonoscopy every 10 years.  Flexible sigmoidoscopy** or colonoscopy.** / Every 5 years for a flexible sigmoidoscopy or every 10 years for a colonoscopy beginning at age 28 and continuing until age 73.  Hepatitis C blood  test.** / For all people born from [birth year] through 1963 and any individual with known risks for hepatitis C.  Abdominal aortic aneurysm (AAA) screening.** / A one-time screening for ages 50 to 27 years who are current or former smokers.  Skin self-exam. / Monthly.  Influenza vaccine. / Every year.  Tetanus, diphtheria, and acellular pertussis (Tdap/Td) vaccine.** / 1 dose of Td every 10 years.  Varicella vaccine.** / Consult your health care provider.  Zoster vaccine.** / 1 dose for adults aged 34 years or older.  Pneumococcal 13-valent conjugate (PCV13) vaccine.** / Consult your health care provider.  Pneumococcal polysaccharide (PPSV23) vaccine.** / 1 dose for all adults aged 63 years and older.  Meningococcal vaccine.** / Consult your health care provider.  Hepatitis A vaccine.** / Consult your health care provider.  Hepatitis B vaccine.** / Consult your health care provider.  Haemophilus influenzae type b (Hib) vaccine.** / Consult your health care provider. **Family history and personal history of risk and conditions may change your health care provider's recommendations. Document Released: 07/03/2001 Document Revised: 05/12/2013 Document Reviewed: 10/02/2010 [facility] Patient Information 2015 [place], [place]. This information is not intended to replace advice given to you by your health care provider. Make sure you discuss any questions you have with your health care provider.

## 2014-09-17 NOTE — Assessment & Plan Note (Signed)
con't antara Check labs

## 2014-09-17 NOTE — Addendum Note (Signed)
Addended by: Elizabeth Sauer on: 09/17/2014 02:48 PM   Modules accepted: Orders

## 2014-09-17 NOTE — Assessment & Plan Note (Deleted)
Per cardiology 

## 2014-09-17 NOTE — Assessment & Plan Note (Signed)
F/u cardiology

## 2014-09-18 LAB — PSA: PSA: 1.74 ng/mL (ref <=4.00)

## 2014-09-20 ENCOUNTER — Encounter: Payer: Self-pay | Admitting: Family Medicine

## 2014-09-20 ENCOUNTER — Other Ambulatory Visit: Payer: Self-pay | Admitting: Family Medicine

## 2014-09-20 DIAGNOSIS — M5440 Lumbago with sciatica, unspecified side: Principal | ICD-10-CM

## 2014-09-20 DIAGNOSIS — M545 Low back pain, unspecified: Secondary | ICD-10-CM

## 2014-09-20 NOTE — Telephone Encounter (Signed)
MRI low back

## 2014-09-22 ENCOUNTER — Encounter: Payer: Self-pay | Admitting: Family Medicine

## 2014-09-23 ENCOUNTER — Other Ambulatory Visit: Payer: Self-pay | Admitting: Family Medicine

## 2014-09-23 ENCOUNTER — Encounter: Payer: Self-pay | Admitting: Family Medicine

## 2014-09-23 ENCOUNTER — Ambulatory Visit
Admission: RE | Admit: 2014-09-23 | Discharge: 2014-09-23 | Disposition: A | Discharge status: Home / Self Care | Payer: Managed Care, Other (non HMO) | Source: Ambulatory Visit | Attending: Family Medicine | Admitting: Family Medicine

## 2014-09-23 ENCOUNTER — Other Ambulatory Visit: Payer: Self-pay

## 2014-09-23 DIAGNOSIS — M545 Low back pain, unspecified: Secondary | ICD-10-CM

## 2014-09-23 DIAGNOSIS — M5440 Lumbago with sciatica, unspecified side: Principal | ICD-10-CM

## 2014-09-23 DIAGNOSIS — M5126 Other intervertebral disc displacement, lumbar region: Secondary | ICD-10-CM

## 2014-09-23 DIAGNOSIS — M48061 Spinal stenosis, lumbar region without neurogenic claudication: Secondary | ICD-10-CM

## 2014-09-23 DIAGNOSIS — M5136 Other intervertebral disc degeneration, lumbar region: Secondary | ICD-10-CM

## 2014-09-24 ENCOUNTER — Encounter: Payer: Self-pay | Admitting: Family Medicine

## 2014-10-01 ENCOUNTER — Encounter: Payer: Self-pay | Admitting: Physical Therapy

## 2014-10-01 ENCOUNTER — Ambulatory Visit: Payer: Managed Care, Other (non HMO) | Attending: Family Medicine | Admitting: Physical Therapy

## 2014-10-01 DIAGNOSIS — M5440 Lumbago with sciatica, unspecified side: Secondary | ICD-10-CM | POA: Diagnosis present

## 2014-10-01 DIAGNOSIS — R262 Difficulty in walking, not elsewhere classified: Secondary | ICD-10-CM

## 2014-10-01 NOTE — Therapy (Signed)
[address] [place], [place], [address] Phone: [phone]   Fax:  [phone]  Physical Therapy Evaluation  Patient Details  Name: [name] MRN: [id number] Date of Birth: [birth year] Referring Provider:  Rosalita Chessman, DO  Encounter Date: 10/01/2014      PT End of Session - 10/01/14 0937    Visit Number 1   Date for PT Re-Evaluation 11/30/14   PT Start Time 0835   PT Stop Time 0933   PT Time Calculation (min) 58 min   Activity Tolerance Patient limited by pain   Behavior During Therapy [facility] for tasks assessed/performed      Past Medical History  Diagnosis Date  . MVP (mitral valve prolapse)   . MR (mitral regurgitation)     severe; mitral valve repair in march 2009  . Atrial flutter     s/p ablation  . Hyperlipidemia   . CAD (coronary artery disease)     cath 06/2007 40% LM  . CVD (cerebrovascular disease)     59% right ICA, 49% left ICA; h/oTIA; fu study 8/11 with normal carotids  . History of colonoscopy   . Osteoarthritis     both knees    Past Surgical History  Procedure Laterality Date  . Tonsillectomy  1960  . Mitral valve repair  06/2007  . Replacement total knee Right     July 11th [facility]     There were no vitals filed for this visit.  Visit Diagnosis:  Midline low back pain with sciatica, sciatica laterality unspecified - Plan: PT plan of care cert/re-cert  Difficulty walking - Plan: PT plan of care cert/re-cert      Subjective Assessment - 10/01/14 0921    Subjective Patient started having significant LBP about April 2016, has had back pain int he past with resolved by PT.  He has very bad knees that may contribute to his back issues, MRI showed significant degenderative changes, but with a disc fragment pressing a nerve.   Limitations Sitting;Lifting;Standing;Walking;Writing;House hold activities   How long can you sit comfortably? 10   How long can  you stand comfortably? 1   How long can you walk comfortably? 2   Diagnostic tests MRI   Patient Stated Goals help relieve pain   Currently in Pain? Yes   Pain Score 7    Pain Location Back   Pain Orientation Lower;Left;Right   Pain Descriptors / Indicators Aching;Numbness;Tingling   Pain Type Acute pain;Chronic pain   Pain Onset 1 to 4 weeks ago   Pain Frequency Constant   Aggravating Factors  any activity   Pain Relieving Factors rest   Effect of Pain on Daily Activities limits everything            [facility] PT Assessment - 10/01/14 0001    Assessment   Medical Diagnosis LBP with disc fragment pressing a nerve   Onset Date 08/20/14   Prior Therapy 3 years ago   Precautions   Precautions None   Balance Screen   Has the patient fallen in the past 6 months No   Has the patient had a decrease in activity level because of a fear of falling?  No   Is the patient reluctant to leave their home because of a fear of falling?  No   Prior Function   Level of Independence Independent with basic ADLs;Independent with homemaking with ambulation   Vocation Full time employment  Vocation Requirements has to lift and stand, travel is required   Posture/Postural Control   Posture Comments significant genu varus, significant forward flexion of trunk   ROM / Strength   AROM / PROM / Strength --  Lumbar ROM decreased 75%   Flexibility   Soft Tissue Assessment /Muscle Length --  very tight HS and piriformis as well as hip flexors and quad   Palpation   Palpation Very tight and tender in the lumbar area and in the right quad and hip flexor area   Slump test   Findings Negative   Straight Leg Raise   Findings Positive   Bed Mobility   Bed Mobility --  very difficult for him, required assist to get to sitting   Ambulation/Gait   Gait Comments has to use a walker, slow, forward flexed trunk,                    [facility] Adult PT Treatment/Exercise - 10/01/14 0001    Modalities    Modalities Moist Heat;Traction;Electrical Stimulation   Moist Heat Therapy   Number Minutes Moist Heat 15 Minutes   Moist Heat Location Lumbar Spine   Electrical Stimulation   Electrical Stimulation Location lumbar   Electrical Stimulation Parameters IFC   Electrical Stimulation Goals Pain   Traction   Type of Traction Lumbar   Min (lbs) 70   Max (lbs) 70   Hold Time static   Rest Time static   Time 15                PT Education - 10/01/14 0937    Education provided Yes   Education Details Wms flexion, with HS and piriformis   Person(s) Educated Patient;Spouse   Methods Explanation;Demonstration;Handout   Comprehension Verbalized understanding;Returned demonstration                     Problem List Patient Active Problem List   Diagnosis Date Noted  . Pre-op examination 09/17/2014  . S/P mitral valve repair 03/18/2013  . Obesity (BMI 30-39.9) 02/24/2013  . HAND PAIN, RIGHT 06/05/2010  . CAD 12/28/2009  . CAROTID STENOSIS 12/28/2009  . CEREBROVASCULAR DISEASE 12/28/2009  . PRURITUS ANI 05/26/2009  . HYPERLIPIDEMIA, FAMILIAL 04/26/2009  . DEGENERATIVE JOINT DISEASE, KNEES, BILATERAL 09/27/2008  . FATTY LIVER DISEASE 12/29/2007  . ABNORMAL TRANSAMINASE-LFT'S 12/29/2007  . COLONIC POLYPS, ADENOMATOUS, HX OF 12/25/2007  . 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 FEVER, HX OF 12/22/2007  . MITRAL REGURGITATION, SEVERE 03/19/2007  . HEMOCCULT POSITIVE STOOL 02/03/2007  . SYSTOLIC MURMUR 45/07/8880    [name], PT 10/01/2014, 9:40 AM  [address] [place], [place], [address] Phone: [phone]   Fax:  [phone]

## 2014-10-16 ENCOUNTER — Other Ambulatory Visit: Payer: Self-pay | Admitting: Family Medicine

## 2014-10-20 ENCOUNTER — Other Ambulatory Visit: Payer: Self-pay | Admitting: Family Medicine

## 2014-11-04 ENCOUNTER — Encounter: Payer: Self-pay | Admitting: Cardiology

## 2014-11-09 ENCOUNTER — Encounter: Payer: Self-pay | Admitting: Physical Therapy

## 2014-11-29 ENCOUNTER — Encounter (HOSPITAL_COMMUNITY): Admission: RE | Payer: Self-pay | Source: Ambulatory Visit

## 2014-11-29 ENCOUNTER — Inpatient Hospital Stay (HOSPITAL_COMMUNITY)
Admission: RE | Admit: 2014-11-29 | Payer: Managed Care, Other (non HMO) | Source: Ambulatory Visit | Admitting: Orthopedic Surgery

## 2014-11-29 SURGERY — ARTHROPLASTY, KNEE, TOTAL
Anesthesia: Choice | Laterality: Right

## 2014-12-03 ENCOUNTER — Ambulatory Visit: Payer: Managed Care, Other (non HMO) | Attending: Family Medicine | Admitting: Physical Therapy

## 2014-12-03 ENCOUNTER — Encounter: Payer: Self-pay | Admitting: Physical Therapy

## 2014-12-03 DIAGNOSIS — M545 Low back pain, unspecified: Secondary | ICD-10-CM

## 2014-12-03 DIAGNOSIS — M623 Immobility syndrome (paraplegic): Secondary | ICD-10-CM | POA: Diagnosis present

## 2014-12-03 DIAGNOSIS — M2560 Stiffness of unspecified joint, not elsewhere classified: Secondary | ICD-10-CM

## 2014-12-03 DIAGNOSIS — R262 Difficulty in walking, not elsewhere classified: Secondary | ICD-10-CM | POA: Diagnosis present

## 2014-12-03 DIAGNOSIS — M5440 Lumbago with sciatica, unspecified side: Secondary | ICD-10-CM | POA: Insufficient documentation

## 2014-12-03 DIAGNOSIS — Z7409 Other reduced mobility: Secondary | ICD-10-CM

## 2014-12-03 NOTE — Therapy (Signed)
[place], [place], [address] Phone: [phone]   Fax:  [phone]  Physical Therapy Evaluation  Patient Details  Name: [name] MRN: [id number] Date of Birth: [birth year] Referring Provider:  Earnie Larsson, MD  Encounter Date: 12/03/2014      PT End of Session - 12/03/14 0947    Visit Number 1   Date for PT Re-Evaluation 02/03/15   PT Start Time 0928   PT Stop Time 2836   PT Time Calculation (min) 47 min   Activity Tolerance Patient tolerated treatment well      Past Medical History  Diagnosis Date  . MVP (mitral valve prolapse)   . MR (mitral regurgitation)     severe; mitral valve repair in march 2009  . Atrial flutter     s/p ablation  . Hyperlipidemia   . CAD (coronary artery disease)     cath 06/2007 40% LM  . CVD (cerebrovascular disease)     59% right ICA, 49% left ICA; h/oTIA; fu study 8/11 with normal carotids  . History of colonoscopy   . Osteoarthritis     both knees    Past Surgical History  Procedure Laterality Date  . Tonsillectomy  1960  . Mitral valve repair  06/2007  . Replacement total knee Right     July 11th [facility]     There were no vitals filed for this visit.  Visit Diagnosis:  Midline low back pain without sciatica - Plan: PT plan of care cert/re-cert  Difficulty walking - Plan: PT plan of care cert/re-cert  Stiffness due to immobility - Plan: PT plan of care cert/re-cert      Subjective Assessment - 12/03/14 0932    Subjective Patient with LBP beginning in April, he had PT here for 1 visit in May and then decided to have surgery.  He underwent a 3 level discectomy and laminectomy on Oct 19, 2014   Limitations Sitting;Lifting;Standing;Walking;Writing;House hold activities   How long can you sit comfortably? 15   How long can you stand comfortably? 2   How long can you walk comfortably? 2   Patient Stated Goals less pain and  return to work   Currently in Pain? Yes   Pain Score 3    Pain Location Back  also c/o pain in the knees which he has TKR scheduled,  pain iwth walking in the knees  a 6-7/10   Pain Orientation Lower   Pain Descriptors / Indicators Aching;Tightness   Pain Type Chronic pain   Pain Onset More than a month ago   Pain Frequency Constant   Aggravating Factors  activity, sitting 30 minutes   Pain Relieving Factors rest   Effect of Pain on Daily Activities limits ability            [facility] PT Assessment - 12/03/14 0001    Assessment   Medical Diagnosis lumbar laminectomy 3 levels   Onset Date/Surgical Date 10/19/14   Next MD Visit January 07, 2015   Prior Therapy 1 visit in May   Precautions   Precautions None   Balance Screen   Has the patient fallen in the past 6 months No   Has the patient had a decrease in activity level because of a fear of falling?  No   Is the patient reluctant to leave their home because of a fear of falling?  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#   Leisure was exercising 1x/week   Posture/Postural Control   Posture Comments significant genu varus, significant forward flexion of trunk   ROM / Strength   AROM / PROM / Strength --  Lumbar ROM was limited by PT at 75% decreased as he had slig   AROM   Overall AROM Comments lumbar ROM was decreased 75% but limited by PT trying to decrease stress on back   Strength   Overall Strength Comments LE's 4-/5 knees are very painful with audible crepitus   Flexibility   Soft Tissue Assessment /Muscle Length --  very tight with HS,  piriformis and ITB   Palpation   Palpation comment tight and mild tenderness in the lumbar area   Slump test   Findings Negative   Straight Leg Raise   Findings Negative   Ambulation/Gait   Gait Comments uses cane and is very stiff in the trunk, minimal movements at hips and trunk, he has significant varus at both knees                   [facility] Adult PT Treatment/Exercise - 12/03/14 0001    Lumbar Exercises: Aerobic   Tread Mill Nustep Level 4 x 6 minutes   UBE (Upper Arm Bike) constant work 35 watts 5 minutes   Lumbar Exercises: Seated   Other Seated Lumbar Exercises on sit fit all pelvic mobility                PT Education - 12/03/14 0946    Education provided Yes   Education Details gave exercises to begin basic ROM and stretching of the LE's   Person(s) Educated Patient   Methods Explanation;Demonstration;Handout   Comprehension Verbalized understanding          PT Short Term Goals - 12/03/14 1008    PT SHORT TERM GOAL #1   Title independent with initial HEP   Time 2   Period Weeks   Status New           PT Long Term Goals - 12/03/14 1009    PT LONG TERM GOAL #1   Title independent with advanced HEP   Time 8   Period Weeks   Status New   PT LONG TERM GOAL #2   Title increase Lumbar ROM by 50%   Time 8   Period Weeks   Status New   PT LONG TERM GOAL #3   Title be able to return to work   Time 8   Period Weeks   Status New   PT LONG TERM GOAL #4   Title lift and carry 30#   Time 8   Period Weeks   Status New               Plan - 12/03/14 0947    Clinical Impression Statement Patient with 3 level laminectomy/discectomy Oct 19, 2014.  He has severe varus at the knees that we will have to be careful with this.  He is very tight and has poor gait due to the knees   Pt will benefit from skilled therapeutic intervention  in order to improve on the following deficits Abnormal gait;Decreased mobility;Decreased range of  motion;Decreased strength;Difficulty walking;Impaired flexibility;Pain   Rehab Potential Good   PT Frequency 2x / week   PT Duration 8 weeks   PT Treatment/Interventions Electrical Stimulation;Moist Heat;Ultrasound;Functional mobility training;Patient/family education;Therapeutic exercise;Therapeutic activities;Manual techniques   PT Next Visit Plan Slowly add exercises   Consulted and Agree with Plan of Care Patient         Problem List Patient Active Problem List   Diagnosis Date Noted  . Pre-op examination 09/17/2014  . S/P mitral valve repair 03/18/2013  . Obesity (BMI 30-39.9) 02/24/2013  . HAND PAIN, RIGHT 06/05/2010  . CAD 12/28/2009  . CAROTID STENOSIS 12/28/2009  . CEREBROVASCULAR DISEASE 12/28/2009  . PRURITUS ANI 05/26/2009  . HYPERLIPIDEMIA, FAMILIAL 04/26/2009  . DEGENERATIVE JOINT DISEASE, KNEES, BILATERAL 09/27/2008  . FATTY LIVER DISEASE 12/29/2007  . ABNORMAL TRANSAMINASE-LFT'S 12/29/2007  . COLONIC POLYPS, ADENOMATOUS, HX OF 12/25/2007  . 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 FEVER, HX OF 12/22/2007  . MITRAL REGURGITATION, SEVERE 03/19/2007  . HEMOCCULT POSITIVE STOOL 02/03/2007  . SYSTOLIC MURMUR 46/56/8127    [name], PT 12/03/2014, 10:11 AM  [address] [place], [place], [address] Phone: [phone]   Fax:  [phone]

## 2014-12-07 ENCOUNTER — Encounter: Payer: Self-pay | Admitting: Physical Therapy

## 2014-12-07 ENCOUNTER — Ambulatory Visit: Payer: Managed Care, Other (non HMO) | Admitting: Physical Therapy

## 2014-12-07 DIAGNOSIS — M2560 Stiffness of unspecified joint, not elsewhere classified: Secondary | ICD-10-CM

## 2014-12-07 DIAGNOSIS — M545 Low back pain, unspecified: Secondary | ICD-10-CM

## 2014-12-07 DIAGNOSIS — R262 Difficulty in walking, not elsewhere classified: Secondary | ICD-10-CM

## 2014-12-07 DIAGNOSIS — Z7409 Other reduced mobility: Secondary | ICD-10-CM

## 2014-12-07 DIAGNOSIS — M5440 Lumbago with sciatica, unspecified side: Secondary | ICD-10-CM

## 2014-12-07 NOTE — Therapy (Signed)
[place] [address] [place], [place], [address] Phone: [phone]   Fax:  [phone]  Physical Therapy Treatment  Patient Details  Name: [name] MRN: [id number] Date of Birth: 04-25-1951 Referring Provider:  Earnie Larsson, MD  Encounter Date: 12/07/2014      PT End of Session - 12/07/14 0930    Visit Number 2   Date for PT Re-Evaluation 02/03/15   PT Start Time 0846   PT Stop Time 0945   PT Time Calculation (min) 59 min   Activity Tolerance Patient tolerated treatment well   Behavior During Therapy [facility] for tasks assessed/performed      Past Medical History  Diagnosis Date  . MVP (mitral valve prolapse)   . MR (mitral regurgitation)     severe; mitral valve repair in march 2009  . Atrial flutter     s/p ablation  . Hyperlipidemia   . CAD (coronary artery disease)     cath 06/2007 40% LM  . CVD (cerebrovascular disease)     59% right ICA, 49% left ICA; h/oTIA; fu study 8/11 with normal carotids  . History of colonoscopy   . Osteoarthritis     both knees    Past Surgical History  Procedure Laterality Date  . Tonsillectomy  1960  . Mitral valve repair  06/2007  . Replacement total knee Right     July 11th [facility]     There were no vitals filed for this visit.  Visit Diagnosis:  Midline low back pain without sciatica  Difficulty walking  Stiffness due to immobility  Midline low back pain with sciatica, sciatica laterality unspecified      Subjective Assessment - 12/07/14 0853    Subjective Had two injections one in each knee yesterday, knees feeling better.  Back felt looser after last time.  Set up bilateral TKR for October.   Currently in Pain? Yes   Pain Score 2    Pain Location Back   Pain Orientation Lower   Pain Descriptors / Indicators Aching;Tightness   Pain Type Chronic pain                         [facility] Adult PT Treatment/Exercise -  12/07/14 0001    Lumbar Exercises: Aerobic   Tread Mill Nustep Level 4 x 6 minutes   UBE (Upper Arm Bike) constant work 35 watts 5 minutes   Lumbar Exercises: Machines for Strengthening   Other Lumbar Machine Exercise seated row 35#, lats 35#   Other Lumbar Machine Exercise feet on ball K2C, rotation and small brideges, also PROM of the HS and piriformis mms   Lumbar Exercises: Seated   Other Seated Lumbar Exercises on sit fit all pelvic mobility, also used a weighted ball for some lumbar extension and rotation                  PT Short Term Goals - 12/03/14 1008    PT SHORT TERM GOAL #1   Title independent with initial HEP   Time 2   Period Weeks   Status New           PT Long Term Goals - 12/03/14 1009    PT LONG TERM GOAL #1   Title independent with advanced HEP   Time 8   Period Weeks   Status New   PT LONG TERM GOAL #2   Title increase Lumbar ROM by  50%   Time 8   Period Weeks   Status New   PT LONG TERM GOAL #3   Title be able to return to work   Time 8   Period Weeks   Status New   PT LONG TERM GOAL #4   Title lift and carry 30#   Time 8   Period Weeks   Status New               Plan - 12/07/14 0930    Clinical Impression Statement Reports that he felt better after getting some movments with the exercises last week.  Injections in the knees help him not limp as much which is helping the back as well   PT Next Visit Plan Slowly add exercises   Consulted and Agree with Plan of Care Patient        Problem List Patient Active Problem List   Diagnosis Date Noted  . Pre-op examination 09/17/2014  . S/P mitral valve repair 03/18/2013  . Obesity (BMI 30-39.9) 02/24/2013  . HAND PAIN, RIGHT 06/05/2010  . CAD 12/28/2009  . CAROTID STENOSIS 12/28/2009  . CEREBROVASCULAR DISEASE 12/28/2009  . PRURITUS ANI 05/26/2009  . HYPERLIPIDEMIA, FAMILIAL 04/26/2009  . DEGENERATIVE JOINT DISEASE, KNEES, BILATERAL 09/27/2008  . FATTY LIVER DISEASE  12/29/2007  . ABNORMAL TRANSAMINASE-LFT'S 12/29/2007  . COLONIC POLYPS, ADENOMATOUS, HX OF 12/25/2007  . 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 FEVER, HX OF 12/22/2007  . MITRAL REGURGITATION, SEVERE 03/19/2007  . HEMOCCULT POSITIVE STOOL 02/03/2007  . SYSTOLIC MURMUR 14/48/1856    [name], PT 12/07/2014, 9:32 AM  [place] [address] [place], [place], [address] Phone: [phone]   Fax:  [phone]

## 2014-12-10 ENCOUNTER — Ambulatory Visit: Payer: Managed Care, Other (non HMO) | Admitting: Physical Therapy

## 2014-12-10 ENCOUNTER — Encounter: Payer: Self-pay | Admitting: Physical Therapy

## 2014-12-10 DIAGNOSIS — R262 Difficulty in walking, not elsewhere classified: Secondary | ICD-10-CM

## 2014-12-10 DIAGNOSIS — M2560 Stiffness of unspecified joint, not elsewhere classified: Secondary | ICD-10-CM

## 2014-12-10 DIAGNOSIS — M545 Low back pain, unspecified: Secondary | ICD-10-CM

## 2014-12-10 DIAGNOSIS — Z7409 Other reduced mobility: Secondary | ICD-10-CM

## 2014-12-10 NOTE — Therapy (Signed)
[place], [place], [address] Phone: [phone]   Fax:  [phone]  Physical Therapy Treatment  Patient Details  Name: [name] MRN: [id number] Date of Birth: 1951-03-15 Referring Provider:  Earnie Larsson, MD  Encounter Date: 12/10/2014      PT End of Session - 12/10/14 0931    Visit Number 3   Date for PT Re-Evaluation 02/03/15   PT Start Time 0845   PT Stop Time 0935   PT Time Calculation (min) 50 min      Past Medical History  Diagnosis Date  . MVP (mitral valve prolapse)   . MR (mitral regurgitation)     severe; mitral valve repair in march 2009  . Atrial flutter     s/p ablation  . Hyperlipidemia   . CAD (coronary artery disease)     cath 06/2007 40% LM  . CVD (cerebrovascular disease)     59% right ICA, 49% left ICA; h/oTIA; fu study 8/11 with normal carotids  . History of colonoscopy   . Osteoarthritis     both knees    Past Surgical History  Procedure Laterality Date  . Tonsillectomy  1960  . Mitral valve repair  06/2007  . Replacement total knee Right     July 11th [facility]     There were no vitals filed for this visit.  Visit Diagnosis:  Midline low back pain without sciatica  Difficulty walking  Stiffness due to immobility      Subjective Assessment - 12/10/14 0847    Subjective bought braces for knee, helping some- stretching helps   Currently in Pain? Yes   Pain Score 4    Pain Location Back                         [facility] Adult PT Treatment/Exercise - 12/10/14 0001    Exercises   Exercises Shoulder;Lumbar   Lumbar Exercises: Aerobic   Tread Mill Nustep Level 5 x 6 minutes   Lumbar Exercises: Machines for Strengthening   Other Lumbar Machine Exercise seated row 35#, lats 35#  2 sets 15   Other Lumbar Machine Exercise feet on ball K2C, rotation and small brideges, also PROM of the HS and piriformis mms   Lumbar  Exercises: Seated   Sit to Stand Limitations weight ball core ex with weighted ball   Other Seated Lumbar Exercises standing OH ext, rotation 15 times each   Lumbar Exercises: Supine   Ab Set 20 reps;3 seconds  isometric ball   Dead Bug 20 reps  weighted ball   Shoulder Exercises: Standing   Row Both;15 reps;Theraband  3way   Manual Therapy   Manual Therapy Passive ROM   Passive ROM LE and trunk                  PT Short Term Goals - 12/10/14 0933    PT SHORT TERM GOAL #1   Title independent with initial HEP   Status Achieved           PT Long Term Goals - 12/10/14 0933    PT LONG TERM GOAL #1   Title independent with advanced HEP   Status On-going   PT LONG TERM GOAL #2   Title increase Lumbar ROM by 50%   Status On-going   PT LONG TERM GOAL #3   Title be able to return to work  Status On-going   PT LONG TERM GOAL #4   Title lift and carry 30#   Status On-going               Plan - 12/10/14 0931    Clinical Impression Statement feeling so much better with ther ex and stretches   PT Next Visit Plan Slowly add exercises        Problem List Patient Active Problem List   Diagnosis Date Noted  . Pre-op examination 09/17/2014  . S/P mitral valve repair 03/18/2013  . Obesity (BMI 30-39.9) 02/24/2013  . HAND PAIN, RIGHT 06/05/2010  . CAD 12/28/2009  . CAROTID STENOSIS 12/28/2009  . CEREBROVASCULAR DISEASE 12/28/2009  . PRURITUS ANI 05/26/2009  . HYPERLIPIDEMIA, FAMILIAL 04/26/2009  . DEGENERATIVE JOINT DISEASE, KNEES, BILATERAL 09/27/2008  . FATTY LIVER DISEASE 12/29/2007  . ABNORMAL TRANSAMINASE-LFT'S 12/29/2007  . COLONIC POLYPS, ADENOMATOUS, HX OF 12/25/2007  . 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 FEVER, HX OF 12/22/2007  . MITRAL REGURGITATION, SEVERE 03/19/2007  . HEMOCCULT POSITIVE STOOL 02/03/2007  . SYSTOLIC MURMUR 01/01/4817    Connor Foxworthy,ANGIE PTA  12/10/2014, 9:34 AM  [address] [place], [place], [address] Phone: [phone]   Fax:  [phone]

## 2014-12-14 ENCOUNTER — Ambulatory Visit: Payer: Managed Care, Other (non HMO) | Admitting: Physical Therapy

## 2014-12-14 ENCOUNTER — Encounter: Payer: Self-pay | Admitting: Physical Therapy

## 2014-12-14 DIAGNOSIS — M2560 Stiffness of unspecified joint, not elsewhere classified: Secondary | ICD-10-CM

## 2014-12-14 DIAGNOSIS — Z7409 Other reduced mobility: Secondary | ICD-10-CM

## 2014-12-14 DIAGNOSIS — M545 Low back pain, unspecified: Secondary | ICD-10-CM

## 2014-12-14 DIAGNOSIS — R262 Difficulty in walking, not elsewhere classified: Secondary | ICD-10-CM

## 2014-12-14 DIAGNOSIS — M5440 Lumbago with sciatica, unspecified side: Secondary | ICD-10-CM

## 2014-12-14 NOTE — Therapy (Signed)
[place], [place], [address] Phone: [phone]   Fax:  [phone]  Physical Therapy Treatment  Patient Details  Name: [name] MRN: [id number] Date of Birth: 1951-01-14 Referring Provider:  Earnie Larsson, MD  Encounter Date: 12/14/2014      PT End of Session - 12/14/14 0956    Visit Number 4   Date for PT Re-Evaluation 02/03/15   PT Start Time 6384   PT Stop Time 0955   PT Time Calculation (min) 68 min   Activity Tolerance Patient tolerated treatment well   Behavior During Therapy [facility] for tasks assessed/performed      Past Medical History  Diagnosis Date  . MVP (mitral valve prolapse)   . MR (mitral regurgitation)     severe; mitral valve repair in march 2009  . Atrial flutter     s/p ablation  . Hyperlipidemia   . CAD (coronary artery disease)     cath 06/2007 40% LM  . CVD (cerebrovascular disease)     59% right ICA, 49% left ICA; h/oTIA; fu study 8/11 with normal carotids  . History of colonoscopy   . Osteoarthritis     both knees    Past Surgical History  Procedure Laterality Date  . Tonsillectomy  1960  . Mitral valve repair  06/2007  . Replacement total knee Right     July 11th [facility]     There were no vitals filed for this visit.  Visit Diagnosis:  Midline low back pain without sciatica  Difficulty walking  Stiffness due to immobility  Midline low back pain with sciatica, sciatica laterality unspecified      Subjective Assessment - 12/14/14 0849    Subjective Not much pain, just sensitivity.     Currently in Pain? Yes   Pain Score 3    Pain Location Back   Pain Orientation Lower   Pain Descriptors / Indicators Aching;Tightness   Pain Type Chronic pain   Pain Onset More than a month ago   Pain Frequency Constant   Aggravating Factors  sitting   Pain Relieving Factors rest                         [facility] Adult PT  Treatment/Exercise - 12/14/14 0001    Lumbar Exercises: Aerobic   Tread Mill Nustep Level 5 x 6 minutes   UBE (Upper Arm Bike) constant work 35 watts 5 minutes   Lumbar Exercises: Machines for Strengthening   Other Lumbar Machine Exercise seated row 35#, lats 35#, 15# seated oblique, 25# isometric holds,    Other Lumbar Machine Exercise feet on ball K2C, rotation and small brideges, also PROM of the HS and piriformis mms   Lumbar Exercises: Supine   Ab Set 20 reps;3 seconds   Dead Bug 20 reps   Manual Therapy   Manual Therapy Passive ROM   Passive ROM LE and trunk                  PT Short Term Goals - 12/10/14 0933    PT SHORT TERM GOAL #1   Title independent with initial HEP   Status Achieved           PT Long Term Goals - 12/14/14 0959    PT LONG TERM GOAL #2   Title increase Lumbar ROM by 50%   Status On-going  Plan - 12/14/14 0957    Clinical Impression Statement Moving better, less stiffness when observing him move and walk.  Still very tight HS, at times uses poor body mechanics   PT Next Visit Plan continue to slowly add exercises and assure body mechanics   Consulted and Agree with Plan of Care Patient        Problem List Patient Active Problem List   Diagnosis Date Noted  . Pre-op examination 09/17/2014  . S/P mitral valve repair 03/18/2013  . Obesity (BMI 30-39.9) 02/24/2013  . HAND PAIN, RIGHT 06/05/2010  . CAD 12/28/2009  . CAROTID STENOSIS 12/28/2009  . CEREBROVASCULAR DISEASE 12/28/2009  . PRURITUS ANI 05/26/2009  . HYPERLIPIDEMIA, FAMILIAL 04/26/2009  . DEGENERATIVE JOINT DISEASE, KNEES, BILATERAL 09/27/2008  . FATTY LIVER DISEASE 12/29/2007  . ABNORMAL TRANSAMINASE-LFT'S 12/29/2007  . COLONIC POLYPS, ADENOMATOUS, HX OF 12/25/2007  . 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 FEVER, HX OF 12/22/2007  . MITRAL REGURGITATION, SEVERE 03/19/2007  . HEMOCCULT POSITIVE STOOL 02/03/2007  . SYSTOLIC MURMUR 48/83/0141     [name], PT 12/14/2014, 10:00 AM  [address] [place], [place], [address] Phone: [phone]   Fax:  [phone]

## 2014-12-17 ENCOUNTER — Ambulatory Visit: Payer: Managed Care, Other (non HMO) | Admitting: Physical Therapy

## 2014-12-17 ENCOUNTER — Encounter: Payer: Self-pay | Admitting: Physical Therapy

## 2014-12-17 DIAGNOSIS — M5440 Lumbago with sciatica, unspecified side: Secondary | ICD-10-CM

## 2014-12-17 DIAGNOSIS — M2560 Stiffness of unspecified joint, not elsewhere classified: Secondary | ICD-10-CM

## 2014-12-17 DIAGNOSIS — M545 Low back pain, unspecified: Secondary | ICD-10-CM

## 2014-12-17 DIAGNOSIS — Z7409 Other reduced mobility: Secondary | ICD-10-CM

## 2014-12-17 DIAGNOSIS — R262 Difficulty in walking, not elsewhere classified: Secondary | ICD-10-CM

## 2014-12-17 NOTE — Therapy (Signed)
[place], [place], [address] Phone: [phone]   Fax:  [phone]  Physical Therapy Treatment  Patient Details  Name: [name] MRN: [id number] Date of Birth: May 12, 1951 Referring Provider:  Earnie Larsson, MD  Encounter Date: 12/17/2014      PT End of Session - 12/17/14 0932    Visit Number 5   PT Start Time 6767   PT Stop Time 0950   PT Time Calculation (min) 63 min   Activity Tolerance Patient tolerated treatment well   Behavior During Therapy [facility] for tasks assessed/performed      Past Medical History  Diagnosis Date  . MVP (mitral valve prolapse)   . MR (mitral regurgitation)     severe; mitral valve repair in march 2009  . Atrial flutter     s/p ablation  . Hyperlipidemia   . CAD (coronary artery disease)     cath 06/2007 40% LM  . CVD (cerebrovascular disease)     59% right ICA, 49% left ICA; h/oTIA; fu study 8/11 with normal carotids  . History of colonoscopy   . Osteoarthritis     both knees    Past Surgical History  Procedure Laterality Date  . Tonsillectomy  1960  . Mitral valve repair  06/2007  . Replacement total knee Right     July 11th [facility]     There were no vitals filed for this visit.  Visit Diagnosis:  Midline low back pain without sciatica  Difficulty walking  Stiffness due to immobility  Midline low back pain with sciatica, sciatica laterality unspecified      Subjective Assessment - 12/17/14 0854    Subjective Back is a little sore, really hurts wehn I bend over   Currently in Pain? Yes   Pain Score 3    Pain Location Back   Pain Orientation Lower   Pain Descriptors / Indicators Aching;Tightness   Pain Type Chronic pain                         [facility] Adult PT Treatment/Exercise - 12/17/14 0001    Lumbar Exercises: Stretches   Passive Hamstring Stretch 30 seconds;4 reps   Single Knee to Chest Stretch 10  seconds;5 reps   Double Knee to Chest Stretch 10 seconds;5 reps   Lower Trunk Rotation 10 seconds;5 reps   Pelvic Tilt 10 seconds;5 reps   Piriformis Stretch 10 seconds;5 reps   Lumbar Exercises: Aerobic   Tread Mill Nustep Level 5 x 8 minutes   Lumbar Exercises: Machines for Strengthening   Other Lumbar Machine Exercise seated row 35#, lats 35#, 15# seated oblique, 25# isometric holds,    Lumbar Exercises: Supine   Dead Bug 20 reps   Large Ball Abdominal Isometric 20 reps;2 seconds   Electrical Stimulation   Electrical Stimulation Location lumbar   Electrical Stimulation Parameters IFC   Electrical Stimulation Goals Pain                  PT Short Term Goals - 12/10/14 0933    PT SHORT TERM GOAL #1   Title independent with initial HEP   Status Achieved           PT Long Term Goals - 12/17/14 0933    PT LONG TERM GOAL #1   Title independent with advanced HEP   Status On-going   PT LONG TERM GOAL #2  Title increase Lumbar ROM by 50%   Status On-going               Plan - 12/17/14 0932    Clinical Impression Statement HS are very tight, difficulty with ADL's due to very bad knees and that makes him use the back, needs some problem solving to help with this   PT Next Visit Plan continue to slowly add exercises and assure body mechanics   Consulted and Agree with Plan of Care Patient        Problem List Patient Active Problem List   Diagnosis Date Noted  . Pre-op examination 09/17/2014  . S/P mitral valve repair 03/18/2013  . Obesity (BMI 30-39.9) 02/24/2013  . HAND PAIN, RIGHT 06/05/2010  . CAD 12/28/2009  . CAROTID STENOSIS 12/28/2009  . CEREBROVASCULAR DISEASE 12/28/2009  . PRURITUS ANI 05/26/2009  . HYPERLIPIDEMIA, FAMILIAL 04/26/2009  . DEGENERATIVE JOINT DISEASE, KNEES, BILATERAL 09/27/2008  . FATTY LIVER DISEASE 12/29/2007  . ABNORMAL TRANSAMINASE-LFT'S 12/29/2007  . COLONIC POLYPS, ADENOMATOUS, HX OF 12/25/2007  . DRESSLER SYNDROME  12/22/2007  . FEVER, HX OF 12/22/2007  . MITRAL REGURGITATION, SEVERE 03/19/2007  . HEMOCCULT POSITIVE STOOL 02/03/2007  . SYSTOLIC MURMUR 45/36/4680    [name], PT 12/17/2014, 9:34 AM  [address] [place], [place], [address] Phone: [phone]   Fax:  [phone]

## 2014-12-21 ENCOUNTER — Encounter: Payer: Self-pay | Admitting: Physical Therapy

## 2014-12-21 ENCOUNTER — Ambulatory Visit: Payer: Managed Care, Other (non HMO) | Attending: Family Medicine | Admitting: Physical Therapy

## 2014-12-21 DIAGNOSIS — M545 Low back pain, unspecified: Secondary | ICD-10-CM

## 2014-12-21 DIAGNOSIS — M2560 Stiffness of unspecified joint, not elsewhere classified: Secondary | ICD-10-CM

## 2014-12-21 DIAGNOSIS — R262 Difficulty in walking, not elsewhere classified: Secondary | ICD-10-CM | POA: Diagnosis present

## 2014-12-21 DIAGNOSIS — M623 Immobility syndrome (paraplegic): Secondary | ICD-10-CM | POA: Diagnosis present

## 2014-12-21 DIAGNOSIS — Z7409 Other reduced mobility: Secondary | ICD-10-CM

## 2014-12-21 DIAGNOSIS — M5440 Lumbago with sciatica, unspecified side: Secondary | ICD-10-CM | POA: Insufficient documentation

## 2014-12-21 NOTE — Therapy (Signed)
[place], [place], [address] Phone: [phone]   Fax:  [phone]  Physical Therapy Treatment  Patient Details  Name: [name] MRN: [id number] Date of Birth: 1950-09-11 Referring Provider:  Earnie Larsson, MD  Encounter Date: 12/21/2014      PT End of Session - 12/21/14 1035    Visit Number 6   Date for PT Re-Evaluation 02/03/15   PT Start Time 4174   PT Stop Time 0951   PT Time Calculation (min) 64 min   Activity Tolerance Patient tolerated treatment well   Behavior During Therapy [facility] for tasks assessed/performed      Past Medical History  Diagnosis Date  . MVP (mitral valve prolapse)   . MR (mitral regurgitation)     severe; mitral valve repair in march 2009  . Atrial flutter     s/p ablation  . Hyperlipidemia   . CAD (coronary artery disease)     cath 06/2007 40% LM  . CVD (cerebrovascular disease)     59% right ICA, 49% left ICA; h/oTIA; fu study 8/11 with normal carotids  . History of colonoscopy   . Osteoarthritis     both knees    Past Surgical History  Procedure Laterality Date  . Tonsillectomy  1960  . Mitral valve repair  06/2007  . Replacement total knee Right     July 11th [facility]     There were no vitals filed for this visit.  Visit Diagnosis:  Midline low back pain without sciatica  Difficulty walking  Stiffness due to immobility      Subjective Assessment - 12/21/14 0856    Subjective stiff and sore today   Currently in Pain? Yes   Pain Score 3    Pain Location Back                         [facility] Adult PT Treatment/Exercise - 12/21/14 0001    Lumbar Exercises: Stretches   Passive Hamstring Stretch 30 seconds;4 reps   Single Knee to Chest Stretch 10 seconds;5 reps   Double Knee to Chest Stretch 10 seconds;5 reps   Lower Trunk Rotation 20 seconds;4 reps   Pelvic Tilt 20 seconds;4 reps   Quad Stretch 20 seconds;4  reps   ITB Stretch 20 seconds;4 reps   Piriformis Stretch 20 seconds;4 reps   Lumbar Exercises: Aerobic   Tread Mill Nustep Level 5 x 8 minutes   UBE (Upper Arm Bike) constant work 35 watts 5 minutes   Lumbar Exercises: Machines for Strengthening   Other Lumbar Machine Exercise seated row 35#, lats 35#, 15# seated oblique, 25# isometric holds,    Other Lumbar Machine Exercise feet on ball K2C, rotation and small brideges, also PROM of the HS and piriformis mms, 15# isometric oblique holds   Lumbar Exercises: Seated   Other Seated Lumbar Exercises on sit fit pelvic mobility and stability   Modalities   Modalities Cryotherapy   Cryotherapy   Number Minutes Cryotherapy 15 Minutes   Cryotherapy Location Lumbar Spine;Knee   Type of Cryotherapy Ice pack   Electrical Stimulation   Electrical Stimulation Location lumbar   Electrical Stimulation Parameters IFC   Electrical Stimulation Goals Pain                  PT Short Term Goals - 12/10/14 0933    PT SHORT TERM GOAL #1   Title independent  with initial HEP   Status Achieved           PT Long Term Goals - 12/21/14 1038    PT LONG TERM GOAL #2   Title increase Lumbar ROM by 50%   Status On-going               Plan - 12/21/14 1036    Clinical Impression Statement Knees are a significant hinderance to progressing.  We need to continue to progress slowly with the back to get him back to work in September is his goal   PT Next Visit Plan continue to slowly add exercises and assure body mechanics   Consulted and Agree with Plan of Care Patient        Problem List Patient Active Problem List   Diagnosis Date Noted  . Pre-op examination 09/17/2014  . S/P mitral valve repair 03/18/2013  . Obesity (BMI 30-39.9) 02/24/2013  . HAND PAIN, RIGHT 06/05/2010  . CAD 12/28/2009  . CAROTID STENOSIS 12/28/2009  . CEREBROVASCULAR DISEASE 12/28/2009  . PRURITUS ANI 05/26/2009  . HYPERLIPIDEMIA, FAMILIAL 04/26/2009  .  DEGENERATIVE JOINT DISEASE, KNEES, BILATERAL 09/27/2008  . FATTY LIVER DISEASE 12/29/2007  . ABNORMAL TRANSAMINASE-LFT'S 12/29/2007  . COLONIC POLYPS, ADENOMATOUS, HX OF 12/25/2007  . 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 FEVER, HX OF 12/22/2007  . MITRAL REGURGITATION, SEVERE 03/19/2007  . HEMOCCULT POSITIVE STOOL 02/03/2007  . SYSTOLIC MURMUR 48/54/6270    [name], PT 12/21/2014, 10:39 AM  [address] [place], [place], [address] Phone: [phone]   Fax:  [phone]

## 2014-12-24 ENCOUNTER — Encounter: Payer: Self-pay | Admitting: Physical Therapy

## 2014-12-24 ENCOUNTER — Ambulatory Visit: Payer: Managed Care, Other (non HMO) | Admitting: Physical Therapy

## 2014-12-24 DIAGNOSIS — M545 Low back pain, unspecified: Secondary | ICD-10-CM

## 2014-12-24 DIAGNOSIS — R262 Difficulty in walking, not elsewhere classified: Secondary | ICD-10-CM

## 2014-12-24 DIAGNOSIS — M5440 Lumbago with sciatica, unspecified side: Secondary | ICD-10-CM

## 2014-12-24 DIAGNOSIS — M2560 Stiffness of unspecified joint, not elsewhere classified: Secondary | ICD-10-CM

## 2014-12-24 DIAGNOSIS — Z7409 Other reduced mobility: Secondary | ICD-10-CM

## 2014-12-24 NOTE — Therapy (Signed)
[place], [place], [address] Phone: [phone]   Fax:  [phone]  Physical Therapy Treatment  Patient Details  Name: [name] MRN: [id number] Date of Birth: Dec 26, 1950 Referring Provider:  Earnie Larsson, MD  Encounter Date: 12/24/2014    Past Medical History  Diagnosis Date  . MVP (mitral valve prolapse)   . MR (mitral regurgitation)     severe; mitral valve repair in march 2009  . Atrial flutter     s/p ablation  . Hyperlipidemia   . CAD (coronary artery disease)     cath 06/2007 40% LM  . CVD (cerebrovascular disease)     59% right ICA, 49% left ICA; h/oTIA; fu study 8/11 with normal carotids  . History of colonoscopy   . Osteoarthritis     both knees    Past Surgical History  Procedure Laterality Date  . Tonsillectomy  1960  . Mitral valve repair  06/2007  . Replacement total knee Right     July 11th [facility]     There were no vitals filed for this visit.  Visit Diagnosis:  Midline low back pain without sciatica  Difficulty walking  Stiffness due to immobility  Midline low back pain with sciatica, sciatica laterality unspecified      Subjective Assessment - 12/24/14 0853    Subjective stiff and tight, some tenderness in the mms in the low back   Currently in Pain? Yes   Pain Score 3    Pain Location Back   Pain Orientation Lower   Pain Descriptors / Indicators Aching;Tender;Tightness   Pain Onset More than a month ago   Pain Frequency Constant   Aggravating Factors  bending and lifting   Pain Relieving Factors rest and ice                         [facility] Adult PT Treatment/Exercise - 12/24/14 0001    Lumbar Exercises: Stretches   Passive Hamstring Stretch 30 seconds;4 reps   Single Knee to Chest Stretch 10 seconds;5 reps   Double Knee to Chest Stretch 10 seconds;5 reps   Lower Trunk Rotation 20 seconds;4 reps   Pelvic Tilt 20  seconds;4 reps   Quad Stretch 20 seconds;4 reps   ITB Stretch 20 seconds;4 reps   Piriformis Stretch 20 seconds;4 reps   Lumbar Exercises: Aerobic   Tread Mill Nustep Level 5 x 8 minutes   UBE (Upper Arm Bike) constant work 35 watts 5 minutes   Lumbar Exercises: Machines for Strengthening   Other Lumbar Machine Exercise seated row 45#, lats 45#, 15# seated oblique, 25# isometric holds,    Other Lumbar Machine Exercise feet on ball K2C, rotation and small brideges, also PROM of the HS and piriformis mms, 15# isometric oblique holds   Lumbar Exercises: Seated   Other Seated Lumbar Exercises on sit fit pelvic mobility and stability   Cryotherapy   Number Minutes Cryotherapy 15 Minutes   Cryotherapy Location Lumbar Spine;Knee   Type of Cryotherapy Ice pack   Electrical Stimulation   Electrical Stimulation Location lumbar   Electrical Stimulation Action IFC   Electrical Stimulation Parameters tolerancetolerance   Electrical Stimulation Goals Pain                  PT Short Term Goals - 12/10/14 0933    PT SHORT TERM GOAL #1   Title independent with initial HEP  Status Achieved           PT Long Term Goals - 12/24/14 0956    PT LONG TERM GOAL #2   Title increase Lumbar ROM by 50%   Status Partially Met               Problem List Patient Active Problem List   Diagnosis Date Noted  . Pre-op examination 09/17/2014  . S/P mitral valve repair 03/18/2013  . Obesity (BMI 30-39.9) 02/24/2013  . HAND PAIN, RIGHT 06/05/2010  . CAD 12/28/2009  . CAROTID STENOSIS 12/28/2009  . CEREBROVASCULAR DISEASE 12/28/2009  . PRURITUS ANI 05/26/2009  . HYPERLIPIDEMIA, FAMILIAL 04/26/2009  . DEGENERATIVE JOINT DISEASE, KNEES, BILATERAL 09/27/2008  . FATTY LIVER DISEASE 12/29/2007  . ABNORMAL TRANSAMINASE-LFT'S 12/29/2007  . COLONIC POLYPS, ADENOMATOUS, HX OF 12/25/2007  . 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 FEVER, HX OF 12/22/2007  . MITRAL REGURGITATION, SEVERE 03/19/2007   . HEMOCCULT POSITIVE STOOL 02/03/2007  . SYSTOLIC MURMUR 22/17/9810    [name], PT 12/24/2014, 9:57 AM  [address] [address] [place], [place], [address] Phone: [phone]   Fax:  [phone]

## 2014-12-28 ENCOUNTER — Ambulatory Visit: Payer: Managed Care, Other (non HMO) | Admitting: Physical Therapy

## 2014-12-28 ENCOUNTER — Encounter: Payer: Self-pay | Admitting: Physical Therapy

## 2014-12-28 DIAGNOSIS — M545 Low back pain, unspecified: Secondary | ICD-10-CM

## 2014-12-28 DIAGNOSIS — R262 Difficulty in walking, not elsewhere classified: Secondary | ICD-10-CM

## 2014-12-28 DIAGNOSIS — M2560 Stiffness of unspecified joint, not elsewhere classified: Secondary | ICD-10-CM

## 2014-12-28 DIAGNOSIS — Z7409 Other reduced mobility: Secondary | ICD-10-CM

## 2014-12-28 NOTE — Therapy (Signed)
[place], [place], [address] Phone: [phone]   Fax:  [phone]  Physical Therapy Treatment  Patient Details  Name: [name] MRN: [id number] Date of Birth: [birth year] Referring Provider:  Earnie Larsson, MD  Encounter Date: 12/28/2014      PT End of Session - 12/28/14 1056    Visit Number 7   Date for PT Re-Evaluation 02/03/15   PT Start Time 0849   PT Stop Time 0951   PT Time Calculation (min) 62 min   Activity Tolerance Patient tolerated treatment well   Behavior During Therapy [facility] for tasks assessed/performed      Past Medical History  Diagnosis Date  . MVP (mitral valve prolapse)   . MR (mitral regurgitation)     severe; mitral valve repair in march 2009  . Atrial flutter     s/p ablation  . Hyperlipidemia   . CAD (coronary artery disease)     cath 06/2007 40% LM  . CVD (cerebrovascular disease)     59% right ICA, 49% left ICA; h/oTIA; fu study 8/11 with normal carotids  . History of colonoscopy   . Osteoarthritis     both knees    Past Surgical History  Procedure Laterality Date  . Tonsillectomy  1960  . Mitral valve repair  06/2007  . Replacement total knee Right     July 11th [facility]     There were no vitals filed for this visit.  Visit Diagnosis:  Midline low back pain without sciatica  Difficulty walking  Stiffness due to immobility      Subjective Assessment - 12/28/14 0852    Subjective Stiff and sore, when I bend is the worst, but there are times that I do not have pain   Currently in Pain? Yes   Pain Score 2    Pain Location Back   Pain Orientation Lower   Aggravating Factors  bending   Pain Relieving Factors rest                         [facility] Adult PT Treatment/Exercise - 12/28/14 0001    Lumbar Exercises: Stretches   Passive Hamstring Stretch 30 seconds;4 reps   Single Knee to Chest Stretch 10 seconds;5 reps    Double Knee to Chest Stretch 10 seconds;5 reps   Lower Trunk Rotation 20 seconds;4 reps   Pelvic Tilt 20 seconds;4 reps   Quad Stretch 20 seconds;4 reps   ITB Stretch 20 seconds;4 reps   Piriformis Stretch 20 seconds;4 reps   Lumbar Exercises: Aerobic   Tread Mill Nustep Level 5 x 8 minutes   UBE (Upper Arm Bike) constant work 40 watts 5 minutes   Lumbar Exercises: Machines for Strengthening   Other Lumbar Machine Exercise seated row 45#, lats 45#, 15# seated oblique, 25# isometric holds,    Other Lumbar Machine Exercise Weighted ball back to wall extension overhead   Lumbar Exercises: Supine   Other Supine Lumbar Exercises feet on ball K2C, and bridges, isometric ball abdominal squeeze   Other Supine Lumbar Exercises Body blade supine for abs                  PT Short Term Goals - 12/10/14 1740    PT SHORT TERM GOAL #1   Title independent with initial HEP   Status Achieved           PT Long  Term Goals - 12/24/14 0956    PT LONG TERM GOAL #2   Title increase Lumbar ROM by 50%   Status Partially Met               Plan - 12/28/14 1056    Clinical Impression Statement Tight in LE's but much improved.  Knees are the biggest hinderance to progress   PT Next Visit Plan continue to slowly add exercises and assure body mechanics   Consulted and Agree with Plan of Care Patient        Problem List Patient Active Problem List   Diagnosis Date Noted  . Pre-op examination 09/17/2014  . S/P mitral valve repair 03/18/2013  . Obesity (BMI 30-39.9) 02/24/2013  . HAND PAIN, RIGHT 06/05/2010  . CAD 12/28/2009  . CAROTID STENOSIS 12/28/2009  . CEREBROVASCULAR DISEASE 12/28/2009  . PRURITUS ANI 05/26/2009  . HYPERLIPIDEMIA, FAMILIAL 04/26/2009  . DEGENERATIVE JOINT DISEASE, KNEES, BILATERAL 09/27/2008  . FATTY LIVER DISEASE 12/29/2007  . ABNORMAL TRANSAMINASE-LFT'S 12/29/2007  . COLONIC POLYPS, ADENOMATOUS, HX OF 12/25/2007  . DRESSLER SYNDROME 12/22/2007   . FEVER, HX OF 12/22/2007  . MITRAL REGURGITATION, SEVERE 03/19/2007  . HEMOCCULT POSITIVE STOOL 02/03/2007  . SYSTOLIC MURMUR 02/03/2007    ALBRIGHT,MICHAEL W., PT 12/28/2014, 10:58 AM  [facility] [address] [place], [place], [address] Phone: [phone]   Fax:  [phone]      

## 2014-12-31 ENCOUNTER — Encounter: Payer: Self-pay | Admitting: Physical Therapy

## 2014-12-31 ENCOUNTER — Ambulatory Visit: Payer: Managed Care, Other (non HMO) | Admitting: Physical Therapy

## 2014-12-31 DIAGNOSIS — M545 Low back pain, unspecified: Secondary | ICD-10-CM

## 2014-12-31 DIAGNOSIS — Z7409 Other reduced mobility: Secondary | ICD-10-CM

## 2014-12-31 DIAGNOSIS — R262 Difficulty in walking, not elsewhere classified: Secondary | ICD-10-CM

## 2014-12-31 DIAGNOSIS — M2560 Stiffness of unspecified joint, not elsewhere classified: Secondary | ICD-10-CM

## 2014-12-31 NOTE — Therapy (Signed)
[place], [place], [address] Phone: [phone]   Fax:  [phone]  Physical Therapy Treatment  Patient Details  Name: [name] MRN: [id number] Date of Birth: 01-11-1951 Referring Provider:  Earnie Larsson, MD  Encounter Date: 12/31/2014      PT End of Session - 12/31/14 0936    Visit Number 8   Date for PT Re-Evaluation 02/03/15   PT Start Time 3299   PT Stop Time 0955   PT Time Calculation (min) 73 min      Past Medical History  Diagnosis Date  . MVP (mitral valve prolapse)   . MR (mitral regurgitation)     severe; mitral valve repair in march 2009  . Atrial flutter     s/p ablation  . Hyperlipidemia   . CAD (coronary artery disease)     cath 06/2007 40% LM  . CVD (cerebrovascular disease)     59% right ICA, 49% left ICA; h/oTIA; fu study 8/11 with normal carotids  . History of colonoscopy   . Osteoarthritis     both knees    Past Surgical History  Procedure Laterality Date  . Tonsillectomy  1960  . Mitral valve repair  06/2007  . Replacement total knee Right     July 11th [facility]     There were no vitals filed for this visit.  Visit Diagnosis:  Midline low back pain without sciatica  Difficulty walking  Stiffness due to immobility      Subjective Assessment - 12/31/14 0902    Subjective left LB muscular pain, stretching helps   Currently in Pain? Yes   Pain Score 2    Pain Location Back            [facility] PT Assessment - 12/31/14 0001    AROM   Overall AROM Comments WFL                     [facility] Adult PT Treatment/Exercise - 12/31/14 0001    Lumbar Exercises: Aerobic   Tread Mill Nustep Level 5 x 8 minutes   UBE (Upper Arm Bike) constant work 40 watts 5 minutes   Lumbar Exercises: Machines for Strengthening   Other Lumbar Machine Exercise seated row 45#, lats 45#, 15# seated oblique, 25# isometric holds,    Other Lumbar  Machine Exercise chest press 35 # 2 sets 15   Lumbar Exercises: Supine   Other Supine Lumbar Exercises core stabd with weighted ball   Lumbar Exercises: Quadruped   Opposite Arm/Leg Raise Right arm/Left leg;Left arm/Right leg;10 reps;3 seconds   Modalities   Modalities Cryotherapy   Moist Heat Therapy   Number Minutes Moist Heat 15 Minutes   Moist Heat Location Lumbar Spine   Cryotherapy   Number Minutes Cryotherapy 15 Minutes   Cryotherapy Location Lumbar Spine;Knee   Type of Cryotherapy Ice pack   Electrical Stimulation   Electrical Stimulation Location lumbar   Electrical Stimulation Action IFC   Electrical Stimulation Goals Pain   Manual Therapy   Manual Therapy Passive ROM   Passive ROM LE and trunk                  PT Short Term Goals - 12/31/14 0912    PT SHORT TERM GOAL #1   Title independent with initial HEP   Status Achieved           PT Long Term Goals -  12/31/14 0916    PT LONG TERM GOAL #2   Title increase Lumbar ROM by 50%   Status Achieved   PT LONG TERM GOAL #3   Title be able to return to work   Status On-going   PT LONG TERM GOAL #4   Title lift and carry 30#   Status On-going               Plan - 12/31/14 0936    Clinical Impression Statement pt is improving with pain and ROM , muscles still very tight, continuing to work on core strength and stab. Progressing with goals   PT Next Visit Plan Discussed D/c with pt to independant program in next 2 weeks        Problem List Patient Active Problem List   Diagnosis Date Noted  . Pre-op examination 09/17/2014  . S/P mitral valve repair 03/18/2013  . Obesity (BMI 30-39.9) 02/24/2013  . HAND PAIN, RIGHT 06/05/2010  . CAD 12/28/2009  . CAROTID STENOSIS 12/28/2009  . CEREBROVASCULAR DISEASE 12/28/2009  . PRURITUS ANI 05/26/2009  . HYPERLIPIDEMIA, FAMILIAL 04/26/2009  . DEGENERATIVE JOINT DISEASE, KNEES, BILATERAL 09/27/2008  . FATTY LIVER DISEASE 12/29/2007  . ABNORMAL  TRANSAMINASE-LFT'S 12/29/2007  . COLONIC POLYPS, ADENOMATOUS, HX OF 12/25/2007  . 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 FEVER, HX OF 12/22/2007  . MITRAL REGURGITATION, SEVERE 03/19/2007  . HEMOCCULT POSITIVE STOOL 02/03/2007  . SYSTOLIC MURMUR 92/76/3943    PAYSEUR,ANGIE PTA 12/31/2014, 9:38 AM  [place] [address] [place], [place], [address] Phone: [phone]   Fax:  [phone]

## 2015-01-03 ENCOUNTER — Ambulatory Visit: Payer: Managed Care, Other (non HMO) | Admitting: Physical Therapy

## 2015-01-03 ENCOUNTER — Encounter: Payer: Self-pay | Admitting: Physical Therapy

## 2015-01-03 DIAGNOSIS — M545 Low back pain, unspecified: Secondary | ICD-10-CM

## 2015-01-03 DIAGNOSIS — M2560 Stiffness of unspecified joint, not elsewhere classified: Secondary | ICD-10-CM

## 2015-01-03 DIAGNOSIS — R262 Difficulty in walking, not elsewhere classified: Secondary | ICD-10-CM

## 2015-01-03 DIAGNOSIS — Z7409 Other reduced mobility: Secondary | ICD-10-CM

## 2015-01-03 DIAGNOSIS — M5440 Lumbago with sciatica, unspecified side: Secondary | ICD-10-CM

## 2015-01-03 NOTE — Therapy (Signed)
[place], [place], [address] Phone: [phone]   Fax:  [phone]  Physical Therapy Treatment  Patient Details  Name: [name] MRN: [id number] Date of Birth: 31-Aug-1950 Referring Provider:  Earnie Larsson, MD  Encounter Date: 01/03/2015      PT End of Session - 01/03/15 0855    Visit Number 9   Date for PT Re-Evaluation 02/03/15   PT Start Time 0845   PT Stop Time 0951   PT Time Calculation (min) 66 min   Activity Tolerance Patient tolerated treatment well   Behavior During Therapy [facility] for tasks assessed/performed      Past Medical History  Diagnosis Date  . MVP (mitral valve prolapse)   . MR (mitral regurgitation)     severe; mitral valve repair in march 2009  . Atrial flutter     s/p ablation  . Hyperlipidemia   . CAD (coronary artery disease)     cath 06/2007 40% LM  . CVD (cerebrovascular disease)     59% right ICA, 49% left ICA; h/oTIA; fu study 8/11 with normal carotids  . History of colonoscopy   . Osteoarthritis     both knees    Past Surgical History  Procedure Laterality Date  . Tonsillectomy  1960  . Mitral valve repair  06/2007  . Replacement total knee Right     July 11th [facility]     There were no vitals filed for this visit.  Visit Diagnosis:  Midline low back pain without sciatica  Difficulty walking  Stiffness due to immobility  Midline low back pain with sciatica, sciatica laterality unspecified      Subjective Assessment - 01/03/15 0849    Subjective mm tightness and ache in the low back, less pain   Currently in Pain? Yes   Pain Score 1    Pain Location Back   Pain Orientation Lower;Left   Pain Descriptors / Indicators Aching   Aggravating Factors  bending and lifting   Pain Relieving Factors streretch, rest and ice                         [facility] Adult PT Treatment/Exercise - 01/03/15 0001    Lumbar  Exercises: Stretches   Passive Hamstring Stretch 30 seconds;4 reps   Single Knee to Chest Stretch 10 seconds;5 reps   Double Knee to Chest Stretch 10 seconds;5 reps   Lower Trunk Rotation 20 seconds;4 reps   Pelvic Tilt 20 seconds;4 reps   Quad Stretch 20 seconds;4 reps   ITB Stretch 20 seconds;4 reps   Piriformis Stretch 20 seconds;4 reps   Lumbar Exercises: Aerobic   Tread Mill Nustep Level 5 x 8 minutes   UBE (Upper Arm Bike) constant work 40 watts 5 minutes   Lumbar Exercises: Machines for Strengthening   Other Lumbar Machine Exercise seated row 45#, lats 45#, 15# seated oblique, 25# isometric holds,    Other Lumbar Machine Exercise chest press 35 # 2 sets 15   Lumbar Exercises: Seated   Other Seated Lumbar Exercises on sit fit pelvic mobility and stability   Lumbar Exercises: Supine   Other Supine Lumbar Exercises core stabd with weighted ball   Cryotherapy   Number Minutes Cryotherapy 15 Minutes   Cryotherapy Location Lumbar Spine;Knee   Type of Cryotherapy Ice pack   Electrical Stimulation   Electrical Stimulation Location lumbar   Electrical Stimulation Action IFC  Electrical Stimulation Parameters tolerance   Electrical Stimulation Goals Pain                  PT Short Term Goals - 12/31/14 0912    PT SHORT TERM GOAL #1   Title independent with initial HEP   Status Achieved           PT Long Term Goals - 01/03/15 0908    PT LONG TERM GOAL #2   Title increase Lumbar ROM by 50%   Status Partially Met   PT LONG TERM GOAL #4   Title lift and carry 30#   Status Partially Met               Plan - 01/03/15 0909    Clinical Impression Statement Knees are the biggest issue with his progress as the pain and instability decrease what we can do and what he can tolerate.   PT Next Visit Plan he is to see MD this week.   Consulted and Agree with Plan of Care Patient        Problem List Patient Active Problem List   Diagnosis Date Noted  .  Pre-op examination 09/17/2014  . S/P mitral valve repair 03/18/2013  . Obesity (BMI 30-39.9) 02/24/2013  . HAND PAIN, RIGHT 06/05/2010  . CAD 12/28/2009  . CAROTID STENOSIS 12/28/2009  . CEREBROVASCULAR DISEASE 12/28/2009  . PRURITUS ANI 05/26/2009  . HYPERLIPIDEMIA, FAMILIAL 04/26/2009  . DEGENERATIVE JOINT DISEASE, KNEES, BILATERAL 09/27/2008  . FATTY LIVER DISEASE 12/29/2007  . ABNORMAL TRANSAMINASE-LFT'S 12/29/2007  . COLONIC POLYPS, ADENOMATOUS, HX OF 12/25/2007  . 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 FEVER, HX OF 12/22/2007  . MITRAL REGURGITATION, SEVERE 03/19/2007  . HEMOCCULT POSITIVE STOOL 02/03/2007  . SYSTOLIC MURMUR 35/78/9784    [name], PT 01/03/2015, 9:29 AM  [place] [address] [facility] [place], [place], [address] Phone: [phone]   Fax:  [phone]

## 2015-01-07 ENCOUNTER — Encounter: Payer: Self-pay | Admitting: Physical Therapy

## 2015-01-07 ENCOUNTER — Ambulatory Visit: Payer: Managed Care, Other (non HMO) | Admitting: Physical Therapy

## 2015-01-07 DIAGNOSIS — Z7409 Other reduced mobility: Secondary | ICD-10-CM

## 2015-01-07 DIAGNOSIS — M545 Low back pain, unspecified: Secondary | ICD-10-CM

## 2015-01-07 DIAGNOSIS — M2560 Stiffness of unspecified joint, not elsewhere classified: Secondary | ICD-10-CM

## 2015-01-07 NOTE — Therapy (Signed)
[place], [place], [address] Phone: [phone]   Fax:  [phone]  Physical Therapy Treatment  Patient Details  Name: [name] MRN: [id number] Date of Birth: 31-Aug-1950 Referring Provider:  Earnie Larsson, MD  Encounter Date: 01/07/2015      PT End of Session - 01/07/15 0934    Visit Number 10   Date for PT Re-Evaluation 02/03/15   PT Start Time 7619   PT Stop Time 0950   PT Time Calculation (min) 63 min      Past Medical History  Diagnosis Date  . MVP (mitral valve prolapse)   . MR (mitral regurgitation)     severe; mitral valve repair in march 2009  . Atrial flutter     s/p ablation  . Hyperlipidemia   . CAD (coronary artery disease)     cath 06/2007 40% LM  . CVD (cerebrovascular disease)     59% right ICA, 49% left ICA; h/oTIA; fu study 8/11 with normal carotids  . History of colonoscopy   . Osteoarthritis     both knees    Past Surgical History  Procedure Laterality Date  . Tonsillectomy  1960  . Mitral valve repair  06/2007  . Replacement total knee Right     July 11th [facility]     There were no vitals filed for this visit.  Visit Diagnosis:  Midline low back pain without sciatica  Stiffness due to immobility      Subjective Assessment - 01/07/15 0843    Subjective stiffness is biggest issue,getting stronger   Currently in Pain? Yes   Pain Score 0-No pain   Pain Location Back                         [facility] Adult PT Treatment/Exercise - 01/07/15 0001    Lumbar Exercises: Aerobic   Tread Mill Nustep Level 6 x 8 minutes   UBE (Upper Arm Bike) constant work 40 watts 5 minutes   Lumbar Exercises: Machines for Strengthening   Cybex Lumbar Extension weighted ball OH 10 times straight back the diag.   Other Lumbar Machine Exercise seated row 45#, lats 45#    Other Lumbar Machine Exercise chest press 35 # 2 sets 15   Lumbar  Exercises: Standing   Other Standing Lumbar Exercises standing on airex trunk rotation with green tband 20 times each   Lumbar Exercises: Supine   Ab Set 20 reps;3 seconds  blue tband   Shoulder Exercises: Supine   Other Supine Exercises bridge with ball,KTC, obl, ab squeeze  red physioball 20 reps   Modalities   Modalities Cryotherapy   Moist Heat Therapy   Number Minutes Moist Heat 15 Minutes   Moist Heat Location Lumbar Spine   Cryotherapy   Number Minutes Cryotherapy 15 Minutes   Cryotherapy Location Lumbar Spine;Knee   Type of Cryotherapy Ice pack   Electrical Stimulation   Electrical Stimulation Location lumbar   Electrical Stimulation Action IFC   Electrical Stimulation Goals Pain   Manual Therapy   Manual Therapy Passive ROM   Passive ROM LE and trunk                  PT Short Term Goals - 12/31/14 0912    PT SHORT TERM GOAL #1   Title independent with initial HEP   Status Achieved  PT Long Term Goals - 01/03/15 0908    PT LONG TERM GOAL #2   Title increase Lumbar ROM by 50%   Status Partially Met   PT LONG TERM GOAL #4   Title lift and carry 30#   Status Partially Met               Plan - 01/07/15 0934    Clinical Impression Statement pt with increased core strength and looser , overall pain decreasing and func increasing   PT Next Visit Plan D/C. GCODE        Problem List Patient Active Problem List   Diagnosis Date Noted  . Pre-op examination 09/17/2014  . S/P mitral valve repair 03/18/2013  . Obesity (BMI 30-39.9) 02/24/2013  . HAND PAIN, RIGHT 06/05/2010  . CAD 12/28/2009  . CAROTID STENOSIS 12/28/2009  . CEREBROVASCULAR DISEASE 12/28/2009  . PRURITUS ANI 05/26/2009  . HYPERLIPIDEMIA, FAMILIAL 04/26/2009  . DEGENERATIVE JOINT DISEASE, KNEES, BILATERAL 09/27/2008  . FATTY LIVER DISEASE 12/29/2007  . ABNORMAL TRANSAMINASE-LFT'S 12/29/2007  . COLONIC POLYPS, ADENOMATOUS, HX OF 12/25/2007  . DRESSLER SYNDROME  12/22/2007  . FEVER, HX OF 12/22/2007  . MITRAL REGURGITATION, SEVERE 03/19/2007  . HEMOCCULT POSITIVE STOOL 02/03/2007  . SYSTOLIC MURMUR 19/16/6060    PAYSEUR,ANGIE PTA 01/07/2015, 9:36 AM  [place] [address] [place], [place], [address] Phone: [phone]   Fax:  [phone]

## 2015-01-10 ENCOUNTER — Ambulatory Visit: Payer: Managed Care, Other (non HMO) | Admitting: Physical Therapy

## 2015-01-10 ENCOUNTER — Encounter: Payer: Self-pay | Admitting: Physical Therapy

## 2015-01-10 DIAGNOSIS — M5440 Lumbago with sciatica, unspecified side: Secondary | ICD-10-CM

## 2015-01-10 DIAGNOSIS — Z7409 Other reduced mobility: Secondary | ICD-10-CM

## 2015-01-10 DIAGNOSIS — M545 Low back pain, unspecified: Secondary | ICD-10-CM

## 2015-01-10 DIAGNOSIS — R262 Difficulty in walking, not elsewhere classified: Secondary | ICD-10-CM

## 2015-01-10 DIAGNOSIS — M2560 Stiffness of unspecified joint, not elsewhere classified: Secondary | ICD-10-CM

## 2015-01-10 NOTE — Therapy (Signed)
[place], [place], [address] Phone: [phone]   Fax:  [phone]  Physical Therapy Treatment  Patient Details  Name: LORRIN NAWROT MRN: [id number] Date of Birth: 03/19/1951 Referring Provider:  Earnie Larsson, MD  Encounter Date: 01/10/2015      PT End of Session - 01/10/15 0930    Visit Number 11   Date for PT Re-Evaluation 02/03/15   PT Start Time 0848   PT Stop Time 0950   PT Time Calculation (min) 62 min   Behavior During Therapy [facility] for tasks assessed/performed      Past Medical History  Diagnosis Date  . MVP (mitral valve prolapse)   . MR (mitral regurgitation)     severe; mitral valve repair in march 2009  . Atrial flutter     s/p ablation  . Hyperlipidemia   . CAD (coronary artery disease)     cath 06/2007 40% LM  . CVD (cerebrovascular disease)     59% right ICA, 49% left ICA; h/oTIA; fu study 8/11 with normal carotids  . History of colonoscopy   . Osteoarthritis     both knees    Past Surgical History  Procedure Laterality Date  . Tonsillectomy  1960  . Mitral valve repair  06/2007  . Replacement total knee Right     July 11th [facility]     There were no vitals filed for this visit.  Visit Diagnosis:  Midline low back pain without sciatica  Stiffness due to immobility  Difficulty walking  Midline low back pain with sciatica, sciatica laterality unspecified      Subjective Assessment - 01/10/15 0855    Subjective I am going to try to go back to work today.  Knees are what is the biggest issue   Currently in Pain? Yes   Pain Score 1    Pain Location Back   Pain Orientation Lower   Pain Descriptors / Indicators Aching   Pain Type Chronic pain                         [facility] Adult PT Treatment/Exercise - 01/10/15 0001    Lumbar Exercises: Stretches   Passive Hamstring Stretch 30 seconds;4 reps   Single Knee to Chest Stretch  10 seconds;5 reps   Double Knee to Chest Stretch 10 seconds;5 reps   Lower Trunk Rotation 20 seconds;4 reps   Pelvic Tilt 20 seconds;4 reps   Quad Stretch 20 seconds;4 reps   ITB Stretch 20 seconds;4 reps   Piriformis Stretch 20 seconds;4 reps   Lumbar Exercises: Aerobic   Tread Mill Nustep Level 6 x 8 minutes   UBE (Upper Arm Bike) constant work 40 watts 5 minutes   Lumbar Exercises: Machines for Strengthening   Cybex Lumbar Extension weighted ball OH 10 times straight back the diag.   Other Lumbar Machine Exercise seated row 45#, lats 45#    Other Lumbar Machine Exercise chest press 35 # 2 sets 15   Lumbar Exercises: Supine   Ab Set 20 reps;3 seconds   Other Supine Lumbar Exercises core stabd with weighted ball   Shoulder Exercises: Supine   Other Supine Exercises bridge with ball,KTC, obl, ab squeeze   Cryotherapy   Number Minutes Cryotherapy 15 Minutes   Cryotherapy Location Lumbar Spine;Knee   Type of Cryotherapy Ice pack   Electrical Stimulation   Electrical Stimulation Location lumbar   Electrical Stimulation  Action IFC   Electrical Stimulation Goals Pain                  PT Short Term Goals - 12/31/14 0912    PT SHORT TERM GOAL #1   Title independent with initial HEP   Status Achieved           PT Long Term Goals - 01/10/15 0931    PT LONG TERM GOAL #1   Title independent with advanced HEP   Status Achieved   PT LONG TERM GOAL #2   Title increase Lumbar ROM by 50%   PT LONG TERM GOAL #3   Title be able to return to work   Status Achieved               Plan - 01/10/15 0930    Clinical Impression Statement Knees are the worst.  My back is feeling  better and I will try to go back to work this week,   PT Next Visit Plan D/C but if he returns to work and he has pain we will resume treatment   Consulted and Agree with Plan of Care Patient        Problem List Patient Active Problem List   Diagnosis Date Noted  . Pre-op examination  09/17/2014  . S/P mitral valve repair 03/18/2013  . Obesity (BMI 30-39.9) 02/24/2013  . HAND PAIN, RIGHT 06/05/2010  . CAD 12/28/2009  . CAROTID STENOSIS 12/28/2009  . CEREBROVASCULAR DISEASE 12/28/2009  . PRURITUS ANI 05/26/2009  . HYPERLIPIDEMIA, FAMILIAL 04/26/2009  . DEGENERATIVE JOINT DISEASE, KNEES, BILATERAL 09/27/2008  . FATTY LIVER DISEASE 12/29/2007  . ABNORMAL TRANSAMINASE-LFT'S 12/29/2007  . COLONIC POLYPS, ADENOMATOUS, HX OF 12/25/2007  . 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 FEVER, HX OF 12/22/2007  . MITRAL REGURGITATION, SEVERE 03/19/2007  . HEMOCCULT POSITIVE STOOL 02/03/2007  . SYSTOLIC MURMUR 95/32/0233    [name], PT 01/10/2015, 9:33 AM  [place] [address] [place], [place], [address] Phone: [phone]   Fax:  [phone]

## 2015-02-03 ENCOUNTER — Ambulatory Visit: Payer: Self-pay | Admitting: Orthopedic Surgery

## 2015-02-03 NOTE — Progress Notes (Signed)
Preoperative surgical orders have been place into the Epic hospital system for [name] on 02/03/2015, 2:40 PM  by Mickel Crow for surgery on 03-09-2015.  Preop Bilateral Total Knee orders including Epidural per Anesthesia, IV Tylenol, and IV Decadron as long as there are no contraindications to the above medications. Arlee Muslim, PA-C

## 2015-02-11 NOTE — Progress Notes (Signed)
      HPI: FU mitral valve repair secondary to severe mitral regurgitation in February 2009. Note preoperative catheterization showed a 40% left main, 30% LAD and 30% right coronary artery. He has also had atrial flutter ablation in March 2009. Postoperatively, he had recurrent fevers that were felt secondary to a postsurgical inflammatory state. Carotid dopplers in August of 2011 were normal. Patient has had elevated LFTs with statins in the past. Echo 9/15 shows normal LV function, mean gradient across MV 5 mmHg. Since I last saw him, he does have some dyspnea on exertion but no orthopnea, PND, pedal edema, chest pain or syncope.  Current Outpatient Prescriptions  Medication Sig Dispense Refill  . amoxicillin (AMOXIL) 500 MG capsule Take 500 mg by mouth as directed. FOR DENTAL WORK ONLY    . aspirin EC 81 MG tablet Take 81 mg by mouth daily.      . meloxicam (MOBIC) 15 MG tablet Take 15 mg by mouth daily.    Marland Kitchen omega-3 acid ethyl esters (LOVAZA) 1 G capsule TAKE 2 CAPSULES BY MOUTH TWICE A DAY 120 capsule 5  . Saw Palmetto, Serenoa repens, (SAW PALMETTO PO) Take 2 tablets by mouth daily.      No current facility-administered medications for this visit.     Past Medical History  Diagnosis Date  . MVP (mitral valve prolapse)   . MR (mitral regurgitation)     severe; mitral valve repair in march 2009  . Atrial flutter     s/p ablation  . Hyperlipidemia   . CAD (coronary artery disease)     cath 06/2007 40% LM  . CVD (cerebrovascular disease)     59% right ICA, 49% left ICA; h/oTIA; fu study 8/11 with normal carotids  . History of colonoscopy   . Osteoarthritis     both knees    Past Surgical History  Procedure Laterality Date  . Tonsillectomy  1960  . Mitral valve repair  06/2007  . Replacement total knee Right     [facility]th  [facility]     Social History   Social History  . Marital Status: Married    Spouse Name: N/A  . Number of Children: N/A  . Years of  Education: N/A   Occupational History  . [employer] and [employer]     works from home   Social History Main Topics  . Smoking status: Never Smoker   . Smokeless tobacco: Never Used  . Alcohol Use: 0.0 oz/week    0 Standard drinks or equivalent per week     Comment: occasional 1-2 wine drinks a month  . Drug Use: No  . Sexual Activity:    Partners: Female   Other Topics Concern  . Not on file   Social History Narrative   Exercise--  Total gym    ROS: back pain and knee arthralgias but no fevers or chills, productive cough, hemoptysis, dysphasia, odynophagia, melena, hematochezia, dysuria, hematuria, rash, seizure activity, orthopnea, PND, pedal edema, claudication. Remaining systems are negative.  Physical Exam: Well-developed well-nourished in no acute distress.  Skin is warm and dry.  HEENT is normal.  Neck is supple.  Chest is clear to auscultation with normal expansion.  Cardiovascular exam is regular rate and rhythm.  Abdominal exam nontender or distended. No masses palpated. Extremities show no edema. neuro grossly intact  ECG sinus rhythm with first-degree AV block. RV conduction delay. No ST changes.

## 2015-02-17 ENCOUNTER — Encounter: Payer: Self-pay | Admitting: *Deleted

## 2015-02-17 ENCOUNTER — Encounter: Payer: Self-pay | Admitting: Cardiology

## 2015-02-17 ENCOUNTER — Ambulatory Visit (INDEPENDENT_AMBULATORY_CARE_PROVIDER_SITE_OTHER): Payer: Managed Care, Other (non HMO) | Admitting: Cardiology

## 2015-02-17 VITALS — BP 144/86 | HR 64 | Ht 69.0 in | Wt 215.5 lb

## 2015-02-17 DIAGNOSIS — Z9889 Other specified postprocedural states: Secondary | ICD-10-CM

## 2015-02-17 DIAGNOSIS — E7800 Pure hypercholesterolemia, unspecified: Secondary | ICD-10-CM

## 2015-02-17 DIAGNOSIS — I25810 Atherosclerosis of coronary artery bypass graft(s) without angina pectoris: Secondary | ICD-10-CM

## 2015-02-17 DIAGNOSIS — Z01818 Encounter for other preprocedural examination: Secondary | ICD-10-CM | POA: Diagnosis not present

## 2015-02-17 NOTE — Assessment & Plan Note (Signed)
Check lipids. He has had difficulties with statins and zetia previously. Continue diet.

## 2015-02-17 NOTE — Assessment & Plan Note (Signed)
Continue SBE prophylaxis.

## 2015-02-17 NOTE — Assessment & Plan Note (Signed)
Continue aspirin. Intolerant to statins.

## 2015-02-17 NOTE — Patient Instructions (Signed)
Your physician wants you to follow-up in: [place] will receive a reminder letter in the mail two months in advance. If you don't receive a letter, please call our office to schedule the follow-up appointment.   Your physician has requested that you have a lexiscan myoview. For further information please visit [email or web address]. Please follow instruction sheet, as given.   Your physician recommends that you return for lab work WHEN YOU COME FOR STRESS TEST

## 2015-02-17 NOTE — Assessment & Plan Note (Signed)
Patient notes some dyspnea with exertion. He has limited mobility because of knee arthralgias and will require bilateral knee replacement. Previous catheterization showed moderate coronary disease. Plan nuclear study for risk stratification preoperatively.

## 2015-02-18 ENCOUNTER — Telehealth (HOSPITAL_COMMUNITY): Payer: Self-pay

## 2015-02-18 NOTE — Telephone Encounter (Signed)
Encounter complete. 

## 2015-02-23 ENCOUNTER — Ambulatory Visit (HOSPITAL_COMMUNITY)
Admission: RE | Admit: 2015-02-23 | Discharge: 2015-02-23 | Disposition: A | Discharge status: Home / Self Care | Payer: Managed Care, Other (non HMO) | Source: Ambulatory Visit | Attending: Cardiovascular Disease | Admitting: Cardiovascular Disease

## 2015-02-23 DIAGNOSIS — Z6831 Body mass index (BMI) 31.0-31.9, adult: Secondary | ICD-10-CM | POA: Insufficient documentation

## 2015-02-23 DIAGNOSIS — I779 Disorder of arteries and arterioles, unspecified: Secondary | ICD-10-CM | POA: Insufficient documentation

## 2015-02-23 DIAGNOSIS — R0609 Other forms of dyspnea: Secondary | ICD-10-CM | POA: Diagnosis not present

## 2015-02-23 DIAGNOSIS — I25810 Atherosclerosis of coronary artery bypass graft(s) without angina pectoris: Secondary | ICD-10-CM | POA: Insufficient documentation

## 2015-02-23 DIAGNOSIS — E669 Obesity, unspecified: Secondary | ICD-10-CM | POA: Diagnosis not present

## 2015-02-23 LAB — MYOCARDIAL PERFUSION IMAGING
CHL CUP NUCLEAR SRS: 3
CHL CUP NUCLEAR SSS: 4
CHL CUP RESTING HR STRESS: 70 {beats}/min
CHL CUP STRESS STAGE 1 DBP: 97 mmHg
CHL CUP STRESS STAGE 3 GRADE: 0 %
CHL CUP STRESS STAGE 3 HR: 67 {beats}/min
CHL CUP STRESS STAGE 3 SPEED: 0 mph
CHL CUP STRESS STAGE 4 DBP: 79 mmHg
CHL CUP STRESS STAGE 4 HR: 75 {beats}/min
CHL CUP STRESS STAGE 4 SBP: 148 mmHg
CSEPEW: 1 METS
CSEPPMHR: 42 %
LV dias vol: 144 mL
LV sys vol: 74 mL
NUC STRESS TID: 1.3
Peak HR: 67 {beats}/min
SDS: 1
Stage 1 Grade: 0 %
Stage 1 HR: 61 {beats}/min
Stage 1 SBP: 153 mmHg
Stage 1 Speed: 0 mph
Stage 2 Grade: 0 %
Stage 2 HR: 60 {beats}/min
Stage 2 Speed: 0 mph
Stage 4 Grade: 0 %
Stage 4 Speed: 0 mph

## 2015-02-23 MED ORDER — REGADENOSON 0.4 MG/5ML IV SOLN
0.4000 mg | Freq: Once | INTRAVENOUS | Status: AC
  Administered 2015-02-23: 0.4 mg via INTRAVENOUS

## 2015-02-23 MED ORDER — TECHNETIUM TC 99M SESTAMIBI GENERIC - CARDIOLITE
32.9000 | Freq: Once | INTRAVENOUS | Status: AC | PRN
  Administered 2015-02-23: 32.9 via INTRAVENOUS

## 2015-02-23 MED ORDER — TECHNETIUM TC 99M SESTAMIBI GENERIC - CARDIOLITE
10.9000 | Freq: Once | INTRAVENOUS | Status: AC | PRN
  Administered 2015-02-23: 10.9 via INTRAVENOUS

## 2015-02-24 ENCOUNTER — Encounter: Payer: Self-pay | Admitting: Cardiology

## 2015-02-24 LAB — LIPID PANEL
Cholesterol: 220 mg/dL — ABNORMAL HIGH (ref 125–200)
HDL: 48 mg/dL (ref >=40)
LDL CALC: 151 mg/dL — AB (ref <130)
Total CHOL/HDL Ratio: 4.6 Ratio (ref <=5.0)
Triglycerides: 107 mg/dL (ref <150)
VLDL: 21 mg/dL (ref <30)

## 2015-02-24 NOTE — Telephone Encounter (Signed)
This encounter was created in error - please disregard.

## 2015-02-24 NOTE — Telephone Encounter (Signed)
Please call Jan's cell phone with the results.

## 2015-02-24 NOTE — Telephone Encounter (Signed)
Left message for pt to call.

## 2015-02-28 ENCOUNTER — Encounter: Payer: Self-pay | Admitting: Gastroenterology

## 2015-03-01 NOTE — Patient Instructions (Addendum)
[name]  03/01/2015   Your procedure is scheduled on: Wednesday 03/09/2015  Report to [facility] Main  Entrance take [place]  elevators to 3rd floor to  [place] at  [place] AM.  Call this number if you have problems the morning of surgery [phone]   Remember: ONLY 1 PERSON MAY GO WITH YOU TO SHORT STAY TO GET  READY MORNING OF [place].  Do not eat food or drink liquids :After Midnight.     Take these medicines the morning of surgery with A SIP OF WATER: NONE                                You may not have any metal on your body including hair pins and              piercings  Do not wear jewelry, lotions, powders or colognes, deodorant             Men may shave face and neck.   Do not bring valuables to the hospital. [place].  Contacts, dentures or bridgework may not be worn into surgery.  Leave suitcase in the car. After surgery it may be brought to your room.              Please read over the following fact sheets you were given:INCENTIVE SPIROMETER; BLOOD TRANSFUSION INFORMATION SHEET; MRSA INFORMATION SHEET _____________________________________________________________________             [facility] - Preparing for Surgery Before surgery, you can play an important role.  Because skin is not sterile, your skin needs to be as free of germs as possible.  You can reduce the number of germs on your skin by washing with CHG (chlorahexidine gluconate) soap before surgery.  CHG is an antiseptic cleaner which kills germs and bonds with the skin to continue killing germs even after washing. Please DO NOT use if you have an allergy to CHG or antibacterial soaps.  If your skin becomes reddened/irritated stop using the CHG and inform your nurse when you arrive at Short Stay. Do not shave (including legs and underarms) for at least 48 hours prior to the first CHG shower.  You may shave your  face/neck. Please follow these instructions carefully:  1.  Shower with CHG Soap the night before surgery and the  morning of Surgery.  2.  If you choose to wash your hair, wash your hair first as usual with your  normal  shampoo.  3.  After you shampoo, rinse your hair and body thoroughly to remove the  shampoo.                           4.  Use CHG as you would any other liquid soap.  You can apply chg directly  to the skin and wash                       Gently with a scrungie or clean washcloth.  5.  Apply the CHG Soap to your body ONLY FROM THE NECK DOWN.   Do not use on face/ open  Wound or open sores. Avoid contact with eyes, ears mouth and genitals (private parts).                       Wash face,  Genitals (private parts) with your normal soap.             6.  Wash thoroughly, paying special attention to the area where your surgery  will be performed.  7.  Thoroughly rinse your body with warm water from the neck down.  8.  DO NOT shower/wash with your normal soap after using and rinsing off  the CHG Soap.                9.  Pat yourself dry with a clean towel.            10.  Wear clean pajamas.            11.  Place clean sheets on your bed the night of your first shower and do not  sleep with pets. Day of Surgery : Do not apply any lotions/deodorants the morning of surgery.  Please wear clean clothes to the hospital/surgery center.  FAILURE TO FOLLOW THESE INSTRUCTIONS MAY RESULT IN THE CANCELLATION OF YOUR SURGERY PATIENT SIGNATURE_________________________________  NURSE SIGNATURE__________________________________  ________________________________________________________________________   [name]  An incentive spirometer is a tool that can help keep your lungs clear and active. This tool measures how well you are filling your lungs with each breath. Taking long deep breaths may help reverse or decrease the chance of developing breathing  (pulmonary) problems (especially infection) following:  A long period of time when you are unable to move or be active. BEFORE THE PROCEDURE   If the spirometer includes an indicator to show your best effort, your nurse or respiratory therapist will set it to a desired goal.  If possible, sit up straight or lean slightly forward. Try not to slouch.  Hold the incentive spirometer in an upright position. INSTRUCTIONS FOR USE   Sit on the edge of your bed if possible, or sit up as far as you can in bed or on a chair.  Hold the incentive spirometer in an upright position.  Breathe out normally.  Place the mouthpiece in your mouth and seal your lips tightly around it.  Breathe in slowly and as deeply as possible, raising the piston or the ball toward the top of the column.  Hold your breath for 3-5 seconds or for as long as possible. Allow the piston or ball to fall to the bottom of the column.  Remove the mouthpiece from your mouth and breathe out normally.  Rest for a few seconds and repeat Steps 1 through 7 at least 10 times every 1-2 hours when you are awake. Take your time and take a few normal breaths between deep breaths.  The spirometer may include an indicator to show your best effort. Use the indicator as a goal to work toward during each repetition.  After each set of 10 deep breaths, practice coughing to be sure your lungs are clear. If you have an incision (the cut made at the time of surgery), support your incision when coughing by placing a pillow or rolled up towels firmly against it. Once you are able to get out of bed, walk around indoors and cough well. You may stop using the incentive spirometer when instructed by your caregiver.  RISKS AND COMPLICATIONS  Take your time so you do not get  dizzy or light-headed.  If you are in pain, you may need to take or ask for pain medication before doing incentive spirometry. It is harder to take a deep breath if you are having  pain. AFTER USE  Rest and breathe slowly and easily.  It can be helpful to keep track of a log of your progress. Your caregiver can provide you with a simple table to help with this. If you are using the spirometer at home, follow these instructions: [place] IF:   You are having difficultly using the spirometer.  You have trouble using the spirometer as often as instructed.  Your pain medication is not giving enough relief while using the spirometer.  You develop fever of 100.5 F (38.1 C) or higher. SEEK IMMEDIATE MEDICAL CARE IF:   You cough up bloody sputum that had not been present before.  You develop fever of 102 F (38.9 C) or greater.  You develop worsening pain at or near the incision site. MAKE SURE YOU:   Understand these instructions.  Will watch your condition.  Will get help right away if you are not doing well or get worse. Document Released: 09/17/2006 Document Revised: 07/30/2011 Document Reviewed: 11/18/2006 ExitCare Patient Information 2014 ExitCare, [place].   ________________________________________________________________________  WHAT IS A BLOOD TRANSFUSION? Blood Transfusion Information  A transfusion is the replacement of blood or some of its parts. Blood is made up of multiple cells which provide different functions.  Red blood cells carry oxygen and are used for blood loss replacement.  White blood cells fight against infection.  Platelets control bleeding.  Plasma helps clot blood.  Other blood products are available for specialized needs, such as hemophilia or other clotting disorders. BEFORE THE TRANSFUSION  Who gives blood for transfusions?   Healthy volunteers who are fully evaluated to make sure their blood is safe. This is blood bank blood. Transfusion therapy is the safest it has ever been in the practice of medicine. Before blood is taken from a donor, a complete history is taken to make sure that person has no history  of diseases nor engages in risky social behavior (examples are intravenous drug use or sexual activity with multiple partners). The donor's travel history is screened to minimize risk of transmitting infections, such as malaria. The donated blood is tested for signs of infectious diseases, such as HIV and hepatitis. The blood is then tested to be sure it is compatible with you in order to minimize the chance of a transfusion reaction. If you or a relative donates blood, this is often done in anticipation of surgery and is not appropriate for emergency situations. It takes many days to process the donated blood. RISKS AND COMPLICATIONS Although transfusion therapy is very safe and saves many lives, the main dangers of transfusion include:   Getting an infectious disease.  Developing a transfusion reaction. This is an allergic reaction to something in the blood you were given. Every precaution is taken to prevent this. The decision to have a blood transfusion has been considered carefully by your caregiver before blood is given. Blood is not given unless the benefits outweigh the risks. AFTER THE TRANSFUSION  Right after receiving a blood transfusion, you will usually feel much better and more energetic. This is especially true if your red blood cells have gotten low (anemic). The transfusion raises the level of the red blood cells which carry oxygen, and this usually causes an energy increase.  The nurse administering the transfusion will  monitor you carefully for complications. HOME CARE INSTRUCTIONS  No special instructions are needed after a transfusion. You may find your energy is better. Speak with your caregiver about any limitations on activity for underlying diseases you may have. SEEK MEDICAL CARE IF:   Your condition is not improving after your transfusion.  You develop redness or irritation at the intravenous (IV) site. SEEK IMMEDIATE MEDICAL CARE IF:  Any of the following symptoms  occur over the next 12 hours:  Shaking chills.  You have a temperature by mouth above 102 F (38.9 C), not controlled by medicine.  Chest, back, or muscle pain.  People around you feel you are not acting correctly or are confused.  Shortness of breath or difficulty breathing.  Dizziness and fainting.  You get a rash or develop hives.  You have a decrease in urine output.  Your urine turns a dark color or changes to pink, red, or brown. Any of the following symptoms occur over the next 10 days:  You have a temperature by mouth above 102 F (38.9 C), not controlled by medicine.  Shortness of breath.  Weakness after normal activity.  The white part of the eye turns yellow (jaundice).  You have a decrease in the amount of urine or are urinating less often.  Your urine turns a dark color or changes to pink, red, or brown. Document Released: 05/04/2000 Document Revised: 07/30/2011 Document Reviewed: 12/22/2007 [facility] Patient Information 2014 [place], [place].  _______________________________________________________________________

## 2015-03-01 NOTE — Progress Notes (Signed)
09/17/2014-PRE-OPERATIVE CLEARANCE AND LAST OFFICE VISIT FROM DR. YVONNE LOWNE ON CHART.

## 2015-03-02 ENCOUNTER — Encounter (HOSPITAL_COMMUNITY): Payer: Self-pay

## 2015-03-02 ENCOUNTER — Encounter (HOSPITAL_COMMUNITY)
Admission: RE | Admit: 2015-03-02 | Discharge: 2015-03-02 | Disposition: A | Discharge status: Home / Self Care | Payer: Managed Care, Other (non HMO) | Source: Ambulatory Visit | Attending: Orthopedic Surgery | Admitting: Orthopedic Surgery

## 2015-03-02 DIAGNOSIS — M179 Osteoarthritis of knee, unspecified: Secondary | ICD-10-CM | POA: Insufficient documentation

## 2015-03-02 DIAGNOSIS — Z01818 Encounter for other preprocedural examination: Secondary | ICD-10-CM | POA: Diagnosis not present

## 2015-03-02 HISTORY — DX: Reserved for inherently not codable concepts without codable children: IMO0001

## 2015-03-02 HISTORY — DX: Unspecified abnormalities of gait and mobility: R26.9

## 2015-03-02 HISTORY — DX: Cardiac murmur, unspecified: R01.1

## 2015-03-02 HISTORY — DX: Personal history of other diseases of the respiratory system: Z87.09

## 2015-03-02 HISTORY — DX: Lobulated, fused and horseshoe kidney: Q63.1

## 2015-03-02 HISTORY — DX: Cardiac arrhythmia, unspecified: I49.9

## 2015-03-02 LAB — URINALYSIS, ROUTINE W REFLEX MICROSCOPIC
Bilirubin Urine: NEGATIVE
Glucose, UA: NEGATIVE mg/dL
Hgb urine dipstick: NEGATIVE
KETONES UR: NEGATIVE mg/dL
LEUKOCYTES UA: NEGATIVE
NITRITE: NEGATIVE
Protein, ur: NEGATIVE mg/dL
SPECIFIC GRAVITY, URINE: 1.01 (ref 1.005–1.030)
Urobilinogen, UA: 0.2 mg/dL (ref 0.0–1.0)
pH: 7 (ref 5.0–8.0)

## 2015-03-02 LAB — CBC
HEMATOCRIT: 46.2 % (ref 39.0–52.0)
Hemoglobin: 15.6 g/dL (ref 13.0–17.0)
MCH: 31 pg (ref 26.0–34.0)
MCHC: 33.8 g/dL (ref 30.0–36.0)
MCV: 91.8 fL (ref 78.0–100.0)
Platelets: 214 10*3/uL (ref 150–400)
RBC: 5.03 MIL/uL (ref 4.22–5.81)
RDW: 12.6 % (ref 11.5–15.5)
WBC: 4.5 10*3/uL (ref 4.0–10.5)

## 2015-03-02 LAB — ABO/RH: ABO/RH(D): O POS

## 2015-03-02 LAB — COMPREHENSIVE METABOLIC PANEL
ALK PHOS: 68 U/L (ref 38–126)
ALT: 68 U/L — AB (ref 17–63)
AST: 38 U/L (ref 15–41)
Albumin: 4.4 g/dL (ref 3.5–5.0)
Anion gap: 8 (ref 5–15)
BILIRUBIN TOTAL: 0.8 mg/dL (ref 0.3–1.2)
BUN: 18 mg/dL (ref 6–20)
CO2: 24 mmol/L (ref 22–32)
CREATININE: 0.99 mg/dL (ref 0.61–1.24)
Calcium: 9.5 mg/dL (ref 8.9–10.3)
Chloride: 106 mmol/L (ref 101–111)
Glucose, Bld: 103 mg/dL — ABNORMAL HIGH (ref 65–99)
Potassium: 4.3 mmol/L (ref 3.5–5.1)
Sodium: 138 mmol/L (ref 135–145)
TOTAL PROTEIN: 7.6 g/dL (ref 6.5–8.1)

## 2015-03-02 LAB — SURGICAL PCR SCREEN
MRSA, PCR: NEGATIVE
Staphylococcus aureus: NEGATIVE

## 2015-03-02 LAB — PROTIME-INR
INR: 1.03 (ref 0.00–1.49)
PROTHROMBIN TIME: 13.7 s (ref 11.6–15.2)

## 2015-03-02 LAB — APTT: aPTT: 32 seconds (ref 24–37)

## 2015-03-02 NOTE — H&P (Signed)
TOTAL KNEE ADMISSION H&P  Patient is being admitted for bilateral total knee arthroplasty.  Subjective:  Chief Complaint:bilateral knee pain.  HPI: [name], 64 y.o. male, has a history of pain and functional disability in the left and right knee due to arthritis and has failed non-surgical conservative treatments for greater than 12 weeks to includeNSAID's and/or analgesics, corticosteriod injections, viscosupplementation injections and activity modification.  Onset of symptoms was gradual, starting >10 years ago with gradually worsening course since that time. The patient noted no past surgery on the left and right knee(s).  Patient currently rates pain in the left and right knee(s) at 8 out of 10 with activity. Patient has night pain, worsening of pain with activity and weight bearing, pain that interferes with activities of daily living, pain with passive range of motion, crepitus and joint swelling.  Patient has evidence of periarticular osteophytes and joint space narrowing by imaging studies. There is no active infection.  Patient Active Problem List   Diagnosis Date Noted  . Pre-op examination 09/17/2014  . S/P mitral valve repair 03/18/2013  . Obesity (BMI 30-39.9) 02/24/2013  . HAND PAIN, RIGHT 06/05/2010  . CAD 12/28/2009  . CAROTID STENOSIS 12/28/2009  . CEREBROVASCULAR DISEASE 12/28/2009  . PRURITUS ANI 05/26/2009  . HYPERLIPIDEMIA, FAMILIAL 04/26/2009  . DEGENERATIVE JOINT DISEASE, KNEES, BILATERAL 09/27/2008  . FATTY LIVER DISEASE 12/29/2007  . ABNORMAL TRANSAMINASE-LFT'S 12/29/2007  . COLONIC POLYPS, ADENOMATOUS, HX OF 12/25/2007  . 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 FEVER, HX OF 12/22/2007  . MITRAL REGURGITATION, SEVERE 03/19/2007  . HEMOCCULT POSITIVE STOOL 02/03/2007  . SYSTOLIC MURMUR 56/43/3295   Past Medical History  Diagnosis Date  . MVP (mitral valve prolapse)   . MR (mitral regurgitation)     severe; mitral valve repair in march 2009  . Atrial flutter      s/p ablation  . Hyperlipidemia   . CAD (coronary artery disease)     cath 06/2007 40% LM  . CVD (cerebrovascular disease)     59% right ICA, 49% left ICA; h/oTIA; fu study 8/11 with normal carotids  . History of colonoscopy   . Osteoarthritis     both knees    Past Surgical History  Procedure Laterality Date  . Tonsillectomy  1960  . Mitral valve repair  06/2007  . Replacement total knee Right     July 11th [facility]       Current outpatient prescriptions:  .  amoxicillin (AMOXIL) 500 MG capsule, Take 500 mg by mouth as directed. FOR DENTAL WORK ONLY, Disp: , Rfl:  .  aspirin EC 81 MG tablet, Take 81 mg by mouth daily.  , Disp: , Rfl:  .  meloxicam (MOBIC) 15 MG tablet, Take 15 mg by mouth daily., Disp: , Rfl:  .  omega-3 acid ethyl esters (LOVAZA) 1 G capsule, TAKE 2 CAPSULES BY MOUTH TWICE A DAY, Disp: 120 capsule, Rfl: 5 .  Saw Palmetto, Serenoa repens, (SAW PALMETTO PO), Take 2 tablets by mouth daily. , Disp: , Rfl:   Allergies  Allergen Reactions  . Statins Other (See Comments)    Messes with liver function  . Codeine     REACTION: nausea  . Tape Rash    Social History  Substance Use Topics  . Smoking status: Never Smoker   . Smokeless tobacco: Never Used  . Alcohol Use: 0.0 oz/week    0 Standard drinks or equivalent per week     Comment: occasional 1-2 wine drinks a  month    Family History  Problem Relation Age of Onset  . Diabetes    . Liver disease Father     cancer  . Hyperlipidemia Father   . Liver cancer Father   . Colon cancer Neg Hx   . Esophageal cancer Neg Hx   . Rectal cancer Neg Hx   . Stomach cancer Neg Hx   . Colitis Daughter   . Crohn's disease Daughter      Review of Systems  Constitutional: Negative.   HENT: Negative.   Eyes: Negative.   Respiratory: Negative.   Cardiovascular: Negative.   Gastrointestinal: Negative.   Genitourinary: Negative.   Musculoskeletal: Positive for joint pain. Negative for myalgias, back  pain, falls and neck pain.       Bilateral knee pain  Skin: Negative.   Neurological: Negative.   Endo/Heme/Allergies: Negative.   Psychiatric/Behavioral: Negative.     Objective:  Physical Exam  Constitutional: He is oriented to person, place, and time. He appears well-developed. No distress.  Overweight  HENT:  Head: Normocephalic and atraumatic.  Right Ear: External ear normal.  Left Ear: External ear normal.  Nose: Nose normal.  Mouth/Throat: Oropharynx is clear and moist.  Eyes: Conjunctivae and EOM are normal.  Neck: Normal range of motion. Neck supple.  Cardiovascular: Normal rate, regular rhythm and intact distal pulses.   Murmur heard. Respiratory: Effort normal and breath sounds normal. No respiratory distress. He has no wheezes.  GI: Soft. Bowel sounds are normal. There is no tenderness.  Musculoskeletal:       Right hip: Normal.       Left hip: Normal.       Right knee: He exhibits decreased range of motion and swelling. He exhibits no effusion and no erythema. Tenderness found. Medial joint line and lateral joint line tenderness noted.       Left knee: He exhibits decreased range of motion and swelling. He exhibits no effusion and no erythema. Tenderness found. Medial joint line and lateral joint line tenderness noted.  He has severe varus deformity of both knees. Incomplete extension and further flexion to around 110 degrees. No effusion, but crepitation throughout range of motion.  Neurological: He is alert and oriented to person, place, and time. He has normal strength and normal reflexes. No sensory deficit.  Skin: No rash noted. He is not diaphoretic. No erythema.  Psychiatric: He has a normal mood and affect. His behavior is normal.    Vitals  Weight: 208 lb Height: 68.5in Body Surface Area: 2.09 m Body Mass Index: 31.17 kg/m  Pulse: 72 (Regular)  BP: 126/80 (Sitting, Left Arm, Standard)  Imaging Review Plain radiographs demonstrate severe  degenerative joint disease of the right and left knee(s). The overall alignment issignificant varus. The bone quality appears to be good for age and reported activity level.  Assessment/Plan:  End stage primary osteoarthritis, left and right knee   The patient history, physical examination, clinical judgment of the provider and imaging studies are consistent with end stage degenerative joint disease of the left and right knee(s) and total knee arthroplasty is deemed medically necessary. The treatment options including medical management, injection therapy arthroscopy and arthroplasty were discussed at length. The risks and benefits of total knee arthroplasty were presented and reviewed. The risks due to aseptic loosening, infection, stiffness, patella tracking problems, thromboembolic complications and other imponderables were discussed. The patient acknowledged the explanation, agreed to proceed with the plan and consent was signed. Patient is being admitted for  inpatient treatment for surgery, pain control, PT, OT, prophylactic antibiotics, VTE prophylaxis, progressive ambulation and ADL's and discharge planning. The patient is planning to be discharged to inpatient rehab ([facility])  PCP: Dr. Etter Sjogren Cardio: Dr. Stanford Breed  Topical TXA    Ardeen Jourdain, PA-C

## 2015-03-02 NOTE — Progress Notes (Signed)
EKG 02/14/2015 per epic Stress test per epic 02/23/2015 with note per Dr Stanford Breed noting clearance ECHO / epic 02/11/2014 Spoke with Dr Ewell/anesthesia in regards to pts H&P. No orders given. Anesthesia to see pt day of surgery.

## 2015-03-09 ENCOUNTER — Inpatient Hospital Stay (HOSPITAL_COMMUNITY)
Admission: RE | Admit: 2015-03-09 | Discharge: 2015-03-14 | DRG: 462 | Disposition: A | Discharge status: Rehabilitation Facility | Payer: Managed Care, Other (non HMO) | Source: Ambulatory Visit | Attending: Orthopedic Surgery | Admitting: Orthopedic Surgery

## 2015-03-09 ENCOUNTER — Inpatient Hospital Stay (HOSPITAL_COMMUNITY): Payer: Managed Care, Other (non HMO) | Admitting: Anesthesiology

## 2015-03-09 ENCOUNTER — Encounter (HOSPITAL_COMMUNITY): Payer: Self-pay | Admitting: *Deleted

## 2015-03-09 ENCOUNTER — Encounter (HOSPITAL_COMMUNITY)
Admission: RE | Disposition: A | Discharge status: Rehabilitation Facility | Payer: Self-pay | Source: Ambulatory Visit | Attending: Orthopedic Surgery

## 2015-03-09 DIAGNOSIS — I2510 Atherosclerotic heart disease of native coronary artery without angina pectoris: Secondary | ICD-10-CM | POA: Diagnosis present

## 2015-03-09 DIAGNOSIS — E663 Overweight: Secondary | ICD-10-CM | POA: Diagnosis present

## 2015-03-09 DIAGNOSIS — K5900 Constipation, unspecified: Secondary | ICD-10-CM | POA: Diagnosis present

## 2015-03-09 DIAGNOSIS — Z471 Aftercare following joint replacement surgery: Secondary | ICD-10-CM | POA: Diagnosis not present

## 2015-03-09 DIAGNOSIS — M179 Osteoarthritis of knee, unspecified: Secondary | ICD-10-CM | POA: Diagnosis present

## 2015-03-09 DIAGNOSIS — M25569 Pain in unspecified knee: Secondary | ICD-10-CM | POA: Diagnosis present

## 2015-03-09 DIAGNOSIS — R2689 Other abnormalities of gait and mobility: Secondary | ICD-10-CM | POA: Diagnosis not present

## 2015-03-09 DIAGNOSIS — R000 Tachycardia, unspecified: Secondary | ICD-10-CM | POA: Diagnosis not present

## 2015-03-09 DIAGNOSIS — Z79899 Other long term (current) drug therapy: Secondary | ICD-10-CM | POA: Diagnosis not present

## 2015-03-09 DIAGNOSIS — M1710 Unilateral primary osteoarthritis, unspecified knee: Secondary | ICD-10-CM | POA: Diagnosis present

## 2015-03-09 DIAGNOSIS — M170 Bilateral primary osteoarthritis of knee: Principal | ICD-10-CM | POA: Diagnosis present

## 2015-03-09 DIAGNOSIS — Z7982 Long term (current) use of aspirin: Secondary | ICD-10-CM | POA: Diagnosis not present

## 2015-03-09 DIAGNOSIS — E785 Hyperlipidemia, unspecified: Secondary | ICD-10-CM | POA: Diagnosis present

## 2015-03-09 DIAGNOSIS — Z6831 Body mass index (BMI) 31.0-31.9, adult: Secondary | ICD-10-CM

## 2015-03-09 DIAGNOSIS — R600 Localized edema: Secondary | ICD-10-CM | POA: Diagnosis not present

## 2015-03-09 DIAGNOSIS — Z01812 Encounter for preprocedural laboratory examination: Secondary | ICD-10-CM | POA: Diagnosis not present

## 2015-03-09 DIAGNOSIS — K5903 Drug induced constipation: Secondary | ICD-10-CM | POA: Diagnosis not present

## 2015-03-09 DIAGNOSIS — I679 Cerebrovascular disease, unspecified: Secondary | ICD-10-CM | POA: Diagnosis present

## 2015-03-09 DIAGNOSIS — Z96653 Presence of artificial knee joint, bilateral: Secondary | ICD-10-CM | POA: Diagnosis not present

## 2015-03-09 DIAGNOSIS — E871 Hypo-osmolality and hyponatremia: Secondary | ICD-10-CM | POA: Diagnosis not present

## 2015-03-09 DIAGNOSIS — D62 Acute posthemorrhagic anemia: Secondary | ICD-10-CM | POA: Diagnosis not present

## 2015-03-09 HISTORY — PX: TOTAL KNEE ARTHROPLASTY: SHX125

## 2015-03-09 LAB — TYPE AND SCREEN
ABO/RH(D): O POS
Antibody Screen: NEGATIVE

## 2015-03-09 SURGERY — ARTHROPLASTY, KNEE, BILATERAL, TOTAL
Anesthesia: Epidural | Site: Knee | Laterality: Bilateral

## 2015-03-09 MED ORDER — DEXAMETHASONE SODIUM PHOSPHATE 10 MG/ML IJ SOLN
10.0000 mg | Freq: Once | INTRAMUSCULAR | Status: AC
  Administered 2015-03-09: 10 mg via INTRAVENOUS

## 2015-03-09 MED ORDER — BISACODYL 10 MG RE SUPP: 10.0000 mg | Freq: Every day | RECTAL | Status: DC | PRN

## 2015-03-09 MED ORDER — FENTANYL CITRATE (PF) 100 MCG/2ML IJ SOLN
INTRAMUSCULAR | Status: AC
  Filled 2015-03-09: qty 4

## 2015-03-09 MED ORDER — MENTHOL 3 MG MT LOZG: 1.0000 | LOZENGE | OROMUCOSAL | Status: DC | PRN

## 2015-03-09 MED ORDER — ACETAMINOPHEN 10 MG/ML IV SOLN
INTRAVENOUS | Status: AC
  Filled 2015-03-09: qty 100

## 2015-03-09 MED ORDER — WARFARIN - PHARMACIST DOSING INPATIENT: Freq: Every day | Status: DC

## 2015-03-09 MED ORDER — PROPOFOL 10 MG/ML IV BOLUS
INTRAVENOUS | Status: AC
  Filled 2015-03-09: qty 20

## 2015-03-09 MED ORDER — ROPIVACAINE HCL 2 MG/ML IJ SOLN
10.0000 mL/h | INTRAMUSCULAR | Status: DC
  Administered 2015-03-09: 8 mL/h via EPIDURAL
  Administered 2015-03-10: 10 mL/h via EPIDURAL
  Filled 2015-03-09 (×4): qty 200

## 2015-03-09 MED ORDER — CEFAZOLIN SODIUM-DEXTROSE 2-3 GM-% IV SOLR
2.0000 g | Freq: Four times a day (QID) | INTRAVENOUS | Status: AC
  Administered 2015-03-09 (×2): 2 g via INTRAVENOUS
  Filled 2015-03-09 (×2): qty 50

## 2015-03-09 MED ORDER — HYDROMORPHONE HCL 2 MG PO TABS
2.0000 mg | ORAL_TABLET | ORAL | Status: DC | PRN
  Administered 2015-03-10: 4 mg via ORAL
  Administered 2015-03-10: 2 mg via ORAL
  Administered 2015-03-11: 4 mg via ORAL
  Administered 2015-03-11: 2 mg via ORAL
  Administered 2015-03-11 (×2): 4 mg via ORAL
  Administered 2015-03-12 – 2015-03-13 (×6): 2 mg via ORAL
  Administered 2015-03-13 – 2015-03-14 (×6): 4 mg via ORAL
  Filled 2015-03-09 (×4): qty 2
  Filled 2015-03-09: qty 1
  Filled 2015-03-09: qty 2
  Filled 2015-03-09: qty 1
  Filled 2015-03-09 (×2): qty 2
  Filled 2015-03-09: qty 1
  Filled 2015-03-09: qty 2
  Filled 2015-03-09 (×5): qty 1
  Filled 2015-03-09 (×3): qty 2

## 2015-03-09 MED ORDER — ACETAMINOPHEN 325 MG PO TABS
650.0000 mg | ORAL_TABLET | Freq: Four times a day (QID) | ORAL | Status: DC | PRN
  Administered 2015-03-12: 650 mg via ORAL
  Filled 2015-03-09: qty 2

## 2015-03-09 MED ORDER — BUPIVACAINE-EPINEPHRINE 0.25% -1:200000 IJ SOLN
INTRAMUSCULAR | Status: AC
  Filled 2015-03-09: qty 1

## 2015-03-09 MED ORDER — CEFAZOLIN SODIUM-DEXTROSE 2-3 GM-% IV SOLR
INTRAVENOUS | Status: AC
  Filled 2015-03-09: qty 50

## 2015-03-09 MED ORDER — FENTANYL CITRATE (PF) 100 MCG/2ML IJ SOLN
INTRAMUSCULAR | Status: DC | PRN
  Administered 2015-03-09: 50 ug via INTRAVENOUS
  Administered 2015-03-09: 100 ug via INTRAVENOUS
  Administered 2015-03-09: 50 ug via INTRAVENOUS

## 2015-03-09 MED ORDER — POLYETHYLENE GLYCOL 3350 17 G PO PACK
17.0000 g | PACK | Freq: Every day | ORAL | Status: DC | PRN
Start: 2015-03-09 — End: 2015-03-14
  Administered 2015-03-11 – 2015-03-13 (×3): 17 g via ORAL
  Filled 2015-03-09 (×3): qty 1

## 2015-03-09 MED ORDER — PHENOL 1.4 % MT LIQD: 1.0000 | OROMUCOSAL | Status: DC | PRN

## 2015-03-09 MED ORDER — PROMETHAZINE HCL 25 MG/ML IJ SOLN
6.2500 mg | INTRAMUSCULAR | Status: DC | PRN
Start: 2015-03-09 — End: 2015-03-09

## 2015-03-09 MED ORDER — FLEET ENEMA 7-19 GM/118ML RE ENEM: 1.0000 | ENEMA | Freq: Once | RECTAL | Status: DC | PRN

## 2015-03-09 MED ORDER — ACETAMINOPHEN 10 MG/ML IV SOLN
1000.0000 mg | Freq: Once | INTRAVENOUS | Status: AC
  Administered 2015-03-09: 1000 mg via INTRAVENOUS

## 2015-03-09 MED ORDER — SODIUM CHLORIDE 0.9 % IV SOLN
INTRAVENOUS | Status: DC
  Administered 2015-03-09: 100 mL/h via INTRAVENOUS
  Administered 2015-03-10 (×2): via INTRAVENOUS

## 2015-03-09 MED ORDER — ONDANSETRON HCL 4 MG PO TABS
4.0000 mg | ORAL_TABLET | Freq: Four times a day (QID) | ORAL | Status: DC | PRN
  Administered 2015-03-10: 4 mg via ORAL
  Filled 2015-03-09: qty 1

## 2015-03-09 MED ORDER — PROPOFOL 10 MG/ML IV BOLUS
INTRAVENOUS | Status: DC | PRN
  Administered 2015-03-09: 150 mg via INTRAVENOUS

## 2015-03-09 MED ORDER — MIDAZOLAM HCL 2 MG/2ML IJ SOLN
INTRAMUSCULAR | Status: AC
  Filled 2015-03-09: qty 2

## 2015-03-09 MED ORDER — HYDROMORPHONE HCL 1 MG/ML IJ SOLN
0.5000 mg | INTRAMUSCULAR | Status: DC | PRN
  Administered 2015-03-10: 1 mg via INTRAVENOUS
  Administered 2015-03-10: 0.5 mg via INTRAVENOUS
  Administered 2015-03-10 – 2015-03-11 (×4): 1 mg via INTRAVENOUS
  Filled 2015-03-09 (×6): qty 1

## 2015-03-09 MED ORDER — WARFARIN SODIUM 4 MG PO TABS
4.0000 mg | ORAL_TABLET | Freq: Once | ORAL | Status: AC
  Administered 2015-03-10: 4 mg via ORAL
  Filled 2015-03-09: qty 1

## 2015-03-09 MED ORDER — ONDANSETRON HCL 4 MG/2ML IJ SOLN
INTRAMUSCULAR | Status: AC
  Filled 2015-03-09: qty 2

## 2015-03-09 MED ORDER — EPHEDRINE SULFATE 50 MG/ML IJ SOLN
INTRAMUSCULAR | Status: AC
  Filled 2015-03-09: qty 1

## 2015-03-09 MED ORDER — SODIUM CHLORIDE 0.9 % IV SOLN: INTRAVENOUS | Status: DC

## 2015-03-09 MED ORDER — CEFAZOLIN SODIUM-DEXTROSE 2-3 GM-% IV SOLR
2.0000 g | INTRAVENOUS | Status: AC
  Administered 2015-03-09: 2 g via INTRAVENOUS

## 2015-03-09 MED ORDER — ONDANSETRON HCL 4 MG/2ML IJ SOLN
4.0000 mg | Freq: Four times a day (QID) | INTRAMUSCULAR | Status: DC | PRN
  Administered 2015-03-10: 4 mg via INTRAVENOUS
  Filled 2015-03-09: qty 2

## 2015-03-09 MED ORDER — COUMADIN BOOK
Freq: Once | Status: DC
  Filled 2015-03-09: qty 1

## 2015-03-09 MED ORDER — DEXAMETHASONE SODIUM PHOSPHATE 10 MG/ML IJ SOLN
10.0000 mg | Freq: Once | INTRAMUSCULAR | Status: AC
  Administered 2015-03-10: 10 mg via INTRAVENOUS
  Filled 2015-03-09: qty 1

## 2015-03-09 MED ORDER — LIDOCAINE-EPINEPHRINE 2 %-1:100000 IJ SOLN
INTRAMUSCULAR | Status: AC
  Filled 2015-03-09: qty 1

## 2015-03-09 MED ORDER — BUPIVACAINE-EPINEPHRINE (PF) 0.25% -1:200000 IJ SOLN
INTRAMUSCULAR | Status: DC | PRN
  Administered 2015-03-09 (×4): 5 mL

## 2015-03-09 MED ORDER — TRANEXAMIC ACID 1000 MG/10ML IV SOLN
1000.0000 mg | INTRAVENOUS | Status: DC | PRN
  Administered 2015-03-09 (×2): 1000 mg via TOPICAL

## 2015-03-09 MED ORDER — ACETAMINOPHEN 650 MG RE SUPP: 650.0000 mg | Freq: Four times a day (QID) | RECTAL | Status: DC | PRN

## 2015-03-09 MED ORDER — SODIUM CHLORIDE 0.9 % IJ SOLN
INTRAMUSCULAR | Status: AC
  Filled 2015-03-09: qty 10

## 2015-03-09 MED ORDER — EPHEDRINE SULFATE 50 MG/ML IJ SOLN
INTRAMUSCULAR | Status: DC | PRN
  Administered 2015-03-09 (×4): 10 mg via INTRAVENOUS

## 2015-03-09 MED ORDER — SODIUM CHLORIDE 0.9 % IR SOLN
Status: DC | PRN
  Administered 2015-03-09 (×2): 1000 mL

## 2015-03-09 MED ORDER — LIDOCAINE HCL (CARDIAC) 20 MG/ML IV SOLN
INTRAVENOUS | Status: DC | PRN
  Administered 2015-03-09: 40 mg via INTRAVENOUS

## 2015-03-09 MED ORDER — METOCLOPRAMIDE HCL 5 MG/ML IJ SOLN
5.0000 mg | Freq: Three times a day (TID) | INTRAMUSCULAR | Status: DC | PRN
Start: 2015-03-09 — End: 2015-03-14

## 2015-03-09 MED ORDER — LACTATED RINGERS IV SOLN
INTRAVENOUS | Status: DC
  Administered 2015-03-09 (×3): via INTRAVENOUS
  Administered 2015-03-09: 1000 mL via INTRAVENOUS

## 2015-03-09 MED ORDER — ONDANSETRON HCL 4 MG/2ML IJ SOLN
INTRAMUSCULAR | Status: DC | PRN
  Administered 2015-03-09: 4 mg via INTRAVENOUS

## 2015-03-09 MED ORDER — LIDOCAINE HCL (CARDIAC) 20 MG/ML IV SOLN
INTRAVENOUS | Status: AC
  Filled 2015-03-09: qty 5

## 2015-03-09 MED ORDER — CHLORHEXIDINE GLUCONATE 4 % EX LIQD: 60.0000 mL | Freq: Once | CUTANEOUS | Status: DC

## 2015-03-09 MED ORDER — TRANEXAMIC ACID 1000 MG/10ML IV SOLN
2000.0000 mg | Freq: Once | INTRAVENOUS | Status: DC
  Filled 2015-03-09: qty 20

## 2015-03-09 MED ORDER — TRAMADOL HCL 50 MG PO TABS
50.0000 mg | ORAL_TABLET | Freq: Four times a day (QID) | ORAL | Status: DC | PRN
  Administered 2015-03-10 – 2015-03-11 (×3): 100 mg via ORAL
  Administered 2015-03-12: 50 mg via ORAL
  Administered 2015-03-14: 100 mg via ORAL
  Filled 2015-03-09 (×2): qty 2
  Filled 2015-03-09: qty 1
  Filled 2015-03-09 (×2): qty 2

## 2015-03-09 MED ORDER — DOCUSATE SODIUM 100 MG PO CAPS
100.0000 mg | ORAL_CAPSULE | Freq: Two times a day (BID) | ORAL | Status: DC
  Administered 2015-03-09 – 2015-03-14 (×10): 100 mg via ORAL

## 2015-03-09 MED ORDER — METHOCARBAMOL 500 MG PO TABS
500.0000 mg | ORAL_TABLET | Freq: Four times a day (QID) | ORAL | Status: DC | PRN
  Administered 2015-03-10 – 2015-03-11 (×4): 500 mg via ORAL
  Filled 2015-03-09 (×4): qty 1

## 2015-03-09 MED ORDER — WARFARIN VIDEO: Freq: Once | Status: DC

## 2015-03-09 MED ORDER — METHOCARBAMOL 1000 MG/10ML IJ SOLN
500.0000 mg | Freq: Four times a day (QID) | INTRAVENOUS | Status: DC | PRN
  Administered 2015-03-09: 500 mg via INTRAVENOUS
  Filled 2015-03-09 (×2): qty 5

## 2015-03-09 MED ORDER — DIPHENHYDRAMINE HCL 12.5 MG/5ML PO ELIX: 12.5000 mg | ORAL_SOLUTION | ORAL | Status: DC | PRN

## 2015-03-09 MED ORDER — ACETAMINOPHEN 500 MG PO TABS
1000.0000 mg | ORAL_TABLET | Freq: Four times a day (QID) | ORAL | Status: AC
  Administered 2015-03-09 – 2015-03-10 (×4): 1000 mg via ORAL
  Filled 2015-03-09 (×4): qty 2

## 2015-03-09 MED ORDER — DEXAMETHASONE SODIUM PHOSPHATE 10 MG/ML IJ SOLN
INTRAMUSCULAR | Status: AC
  Filled 2015-03-09: qty 1

## 2015-03-09 MED ORDER — METOCLOPRAMIDE HCL 10 MG PO TABS: 5.0000 mg | ORAL_TABLET | Freq: Three times a day (TID) | ORAL | Status: DC | PRN

## 2015-03-09 MED ORDER — FENTANYL CITRATE (PF) 100 MCG/2ML IJ SOLN: 25.0000 ug | INTRAMUSCULAR | Status: DC | PRN

## 2015-03-09 MED ORDER — MIDAZOLAM HCL 2 MG/2ML IJ SOLN
1.0000 mg | INTRAMUSCULAR | Status: DC | PRN
  Administered 2015-03-09: 2 mg via INTRAVENOUS

## 2015-03-09 MED ORDER — LIDOCAINE-EPINEPHRINE (PF) 1.5 %-1:200000 IJ SOLN
INTRAMUSCULAR | Status: DC | PRN
  Administered 2015-03-09: 3 mL via EPIDURAL

## 2015-03-09 MED ORDER — BUPIVACAINE-EPINEPHRINE (PF) 0.25% -1:200000 IJ SOLN
INTRAMUSCULAR | Status: AC
  Filled 2015-03-09: qty 30

## 2015-03-09 SURGICAL SUPPLY — 69 items
AUG TIB SZ4 10 REV STP WDG (Knees) ×1 IMPLANT
AUG TIB SZ5 10 REV STP WDG (Knees) ×1 IMPLANT
BAG SPEC THK2 15X12 ZIP CLS (MISCELLANEOUS) ×2
BAG ZIPLOCK 12X15 (MISCELLANEOUS) ×6 IMPLANT
BANDAGE ELASTIC 6 VELCRO ST LF (GAUZE/BANDAGES/DRESSINGS) ×6 IMPLANT
BANDAGE ESMARK 6X9 LF (GAUZE/BANDAGES/DRESSINGS) ×2 IMPLANT
BLADE SAG 18X100X1.27 (BLADE) ×3 IMPLANT
BLADE SAW SGTL 11.0X1.19X90.0M (BLADE) ×3 IMPLANT
BLADE SURG SZ10 CARB STEEL (BLADE) ×6 IMPLANT
BNDG CMPR 9X6 STRL LF SNTH (GAUZE/BANDAGES/DRESSINGS) ×2
BNDG COHESIVE 6X5 TAN STRL LF (GAUZE/BANDAGES/DRESSINGS) ×3 IMPLANT
BNDG ESMARK 6X9 LF (GAUZE/BANDAGES/DRESSINGS) ×6
BOWL SMART MIX CTS (DISPOSABLE) ×6 IMPLANT
CAP KNEE TOTAL 3 SIGMA ×4 IMPLANT
CEMENT HV SMART SET (Cement) ×12 IMPLANT
CLOSURE WOUND 1/2 X4 (GAUZE/BANDAGES/DRESSINGS) ×2
CUFF TOURN SGL QUICK 34 (TOURNIQUET CUFF) ×6
CUFF TRNQT CYL 34X4X40X1 (TOURNIQUET CUFF) ×2 IMPLANT
DRAPE EXTREMITY BILATERAL (DRAPE) ×3 IMPLANT
DRAPE INCISE IOBAN 66X45 STRL (DRAPES) ×3 IMPLANT
DRAPE POUCH INSTRU U-SHP 10X18 (DRAPES) ×3 IMPLANT
DRAPE U-SHAPE 47X51 STRL (DRAPES) ×9 IMPLANT
DRSG ADAPTIC 3X8 NADH LF (GAUZE/BANDAGES/DRESSINGS) ×6 IMPLANT
DRSG PAD ABDOMINAL 8X10 ST (GAUZE/BANDAGES/DRESSINGS) ×2 IMPLANT
DURAPREP 26ML APPLICATOR (WOUND CARE) ×6 IMPLANT
ELECT REM PT RETURN 9FT ADLT (ELECTROSURGICAL) ×3
ELECTRODE REM PT RTRN 9FT ADLT (ELECTROSURGICAL) ×1 IMPLANT
EVACUATOR 1/8 PVC DRAIN (DRAIN) ×6 IMPLANT
FACESHIELD WRAPAROUND (MASK) ×24 IMPLANT
FACESHIELD WRAPAROUND OR TEAM (MASK) ×8 IMPLANT
GAUZE SPONGE 4X4 12PLY STRL (GAUZE/BANDAGES/DRESSINGS) ×6 IMPLANT
GLOVE BIO SURGEON STRL SZ7.5 (GLOVE) ×6 IMPLANT
GLOVE BIO SURGEON STRL SZ8 (GLOVE) ×6 IMPLANT
GLOVE BIOGEL PI IND STRL 8 (GLOVE) ×2 IMPLANT
GLOVE BIOGEL PI INDICATOR 8 (GLOVE) ×4
GOWN STRL REUS W/TWL LRG LVL3 (GOWN DISPOSABLE) ×3 IMPLANT
GOWN STRL REUS W/TWL XL LVL3 (GOWN DISPOSABLE) ×3 IMPLANT
HANDPIECE INTERPULSE COAX TIP (DISPOSABLE) ×3
IMMOBILIZER KNEE 20 (SOFTGOODS) ×4 IMPLANT
IMMOBILIZER KNEE 20 THIGH 36 (SOFTGOODS) ×2 IMPLANT
KIT BASIN OR (CUSTOM PROCEDURE TRAY) ×3 IMPLANT
MANIFOLD NEPTUNE II (INSTRUMENTS) ×3 IMPLANT
NDL SAFETY ECLIPSE 18X1.5 (NEEDLE) ×2 IMPLANT
NEEDLE HYPO 18GX1.5 SHARP (NEEDLE) ×6
NS IRRIG 1000ML POUR BTL (IV SOLUTION) ×3 IMPLANT
PACK TOTAL JOINT (CUSTOM PROCEDURE TRAY) ×3 IMPLANT
PAD ABD 8X10 STRL (GAUZE/BANDAGES/DRESSINGS) ×4 IMPLANT
PADDING CAST COTTON 6X4 STRL (CAST SUPPLIES) ×10 IMPLANT
SET HNDPC FAN SPRY TIP SCT (DISPOSABLE) ×1 IMPLANT
SPONGE LAP 18X18 X RAY DECT (DISPOSABLE) ×3 IMPLANT
STOCKINETTE 8 INCH (MISCELLANEOUS) ×3 IMPLANT
STRIP CLOSURE SKIN 1/2X4 (GAUZE/BANDAGES/DRESSINGS) ×6 IMPLANT
SUCTION FRAZIER 12FR DISP (SUCTIONS) ×3 IMPLANT
SUT MNCRL AB 4-0 PS2 18 (SUTURE) ×6 IMPLANT
SUT VIC AB 2-0 CT1 27 (SUTURE) ×18
SUT VIC AB 2-0 CT1 TAPERPNT 27 (SUTURE) ×6 IMPLANT
SUT VLOC 180 0 24IN GS25 (SUTURE) ×6 IMPLANT
SYR 20CC LL (SYRINGE) ×6 IMPLANT
SYR 50ML LL SCALE MARK (SYRINGE) ×6 IMPLANT
TOWEL OR 17X26 10 PK STRL BLUE (TOWEL DISPOSABLE) ×6 IMPLANT
TRAY FOLEY W/METER SILVER 14FR (SET/KITS/TRAYS/PACK) ×1 IMPLANT
TRAY FOLEY W/METER SILVER 16FR (SET/KITS/TRAYS/PACK) ×3 IMPLANT
TRAY REVISION SZ 4 (Knees) ×2 IMPLANT
TRAY TIB SZ 5 REVISION (Knees) ×2 IMPLANT
WATER STERILE IRR 1500ML POUR (IV SOLUTION) ×6 IMPLANT
WEDGE SIZE 4 10MM (Knees) ×2 IMPLANT
WEDGE SZ 5 5MM 10MM (Knees) ×2 IMPLANT
WRAP KNEE MAXI GEL POST OP (GAUZE/BANDAGES/DRESSINGS) ×6 IMPLANT
YANKAUER SUCT BULB TIP 10FT TU (MISCELLANEOUS) IMPLANT

## 2015-03-09 NOTE — Plan of Care (Signed)
Problem: Consults Goal: Diagnosis- Total Joint Replacement Outcome: Completed/Met Date Met:  03/09/15 Primary Total Knee BILATERAL KNEES

## 2015-03-09 NOTE — Progress Notes (Signed)
AssistedDr. Gifford Shave with epidural. Side rails up, monitors on throughout procedure. See vital signs in flow sheet. Tolerated Procedure well.

## 2015-03-09 NOTE — Anesthesia Preprocedure Evaluation (Addendum)
Anesthesia Evaluation  Patient identified by MRN, date of birth, ID band Patient awake    Reviewed: Allergy & Precautions, NPO status , Patient's Chart, lab work & pertinent test results  Airway Mallampati: II  TM Distance: >3 FB Neck ROM: Full    Dental  (+) Teeth Intact, Dental Advisory Given   Pulmonary neg pulmonary ROS,    Pulmonary exam normal breath sounds clear to auscultation       Cardiovascular (-) angina+ CAD and + Peripheral Vascular Disease  (-) Past MI Normal cardiovascular exam+ dysrhythmias Atrial Fibrillation + Valvular Problems/Murmurs (s/p ablation) MVP  Rhythm:Regular Rate:Normal     Neuro/Psych negative neurological ROS     GI/Hepatic negative GI ROS, Neg liver ROS,   Endo/Other  negative endocrine ROS  Renal/GU negative Renal ROS     Musculoskeletal  (+) Arthritis , Osteoarthritis,    Abdominal   Peds  Hematology negative hematology ROS (+)   Anesthesia Other Findings Day of surgery medications reviewed with the patient.  Reproductive/Obstetrics                             Anesthesia Physical Anesthesia Plan  ASA: III  Anesthesia Plan: General and Epidural   Post-op Pain Management: GA combined w/ Regional for post-op pain   Induction: Intravenous  Airway Management Planned: LMA  Additional Equipment:   Intra-op Plan:   Post-operative Plan: Extubation in OR  Informed Consent: I have reviewed the patients History and Physical, chart, labs and discussed the procedure including the risks, benefits and alternatives for the proposed anesthesia with the patient or authorized representative who has indicated his/her understanding and acceptance.   Dental advisory given  Plan Discussed with: CRNA  Anesthesia Plan Comments: (Risks/benefits of general anesthesia discussed with patient including risk of damage to teeth, lips, gum, and tongue, nausea/vomiting,  allergic reactions to medications, and the possibility of heart attack, stroke and death.  All patient questions answered.  Patient wishes to proceed.  GA + Epidural for postop pain control)       Anesthesia Quick Evaluation

## 2015-03-09 NOTE — Anesthesia Procedure Notes (Addendum)
Epidural Patient location during procedure: holding area  Staffing Anesthesiologist: [name] Performed by: anesthesiologist   Preanesthetic Checklist Completed: patient identified, surgical consent, pre-op evaluation, timeout performed, IV checked, risks and benefits discussed, monitors and equipment checked and post-op pain management  Epidural Patient position: sitting Prep: ChloraPrep Patient monitoring: blood pressure and continuous pulse ox Approach: midline Location: L3-L4 Injection technique: LOR air  Needle:  Needle type: Tuohy  Needle gauge: 17 G Needle length: 9 cm Needle insertion depth: 5.5 cm Catheter size: 19 Gauge Catheter at skin depth: 11 cm Test dose: negative and 1.5% lidocaine with Epi 1:200 K (1% Lidocaine)  Additional Notes Patient identified.  Risk benefits discussed including failed block, incomplete pain control, headache, nerve damage, paralysis, blood pressure changes, nausea, vomiting, reactions to medication both toxic or allergic, and postpartum back pain.  Patient expressed understanding and wished to proceed.  All questions were answered.  Sterile technique used throughout procedure and epidural site dressed with sterile barrier dressing. No paresthesia or other complications noted. The patient did not experience any signs of intravascular injection such as tinnitus or metallic taste in mouth nor signs of intrathecal spread such as rapid motor block. Please see nursing notes for vital signs. Reason for block:post-op pain management  Procedure Name: LMA Insertion Date/Time: 03/09/2015 10:41 AM Performed by: Lajuana Carry E Pre-anesthesia Checklist: Patient identified, Emergency Drugs available, Suction available and Patient being monitored Patient Re-evaluated:Patient Re-evaluated prior to inductionOxygen Delivery Method: Circle system utilized Preoxygenation: Pre-oxygenation with 100% oxygen Intubation Type: IV induction Ventilation:  Mask ventilation without difficulty LMA: LMA inserted LMA Size: 5.0 Number of attempts: 1 Placement Confirmation: positive ETCO2 and breath sounds checked- equal and bilateral Dental Injury: Teeth and Oropharynx as per pre-operative assessment

## 2015-03-09 NOTE — Transfer of Care (Signed)
Immediate Anesthesia Transfer of Care Note  Patient: [name]  Procedure(s) Performed: Procedure(s) with comments: TOTAL KNEE BILATERAL (Bilateral) - With LMA general  Patient Location: PACU  Anesthesia Type:General and Epidural  Level of Consciousness: sedated  Airway & Oxygen Therapy: Patient Spontanous Breathing and Patient connected to face mask oxygen  Post-op Assessment: Report given to RN and Post -op Vital signs reviewed and stable  Post vital signs: Reviewed and stable  Last Vitals:  Filed Vitals:   03/09/15 1031  BP:   Pulse: 66  Temp:   Resp:

## 2015-03-09 NOTE — Interval H&P Note (Signed)
History and Physical Interval Note:  03/09/2015 9:45 AM  [name]  has presented today for surgery, with the diagnosis of osteoarthitis both knees  The various methods of treatment have been discussed with the patient and family. After consideration of risks, benefits and other options for treatment, the patient has consented to  Procedure(s): TOTAL KNEE BILATERAL (Bilateral) as a surgical intervention .  The patient's history has been reviewed, patient examined, no change in status, stable for surgery.  I have reviewed the patient's chart and labs.  Questions were answered to the patient's satisfaction.     Gearlean Alf

## 2015-03-09 NOTE — Op Note (Signed)
Pre-operative diagnosis- Osteoarthritis  Bilateral knee(s)  Post-operative diagnosis- Osteoarthritis Bilateral knee(s)  Procedure-  Bilateral  Total Knee Arthroplasty  Surgeon- Peter Plover. Takita Riecke, MD  Assistant- Arlee Muslim, PA-C   Anesthesia-  General and Epidural  EBL-* No blood loss amount entered *   Drains Hemovac x 1 each side  Tourniquet time-  Total Tourniquet Time Documented: Thigh (Left) - 36 minutes Total: Thigh (Left) - 36 minutes  Thigh (Right) - 42 minutes Total: Thigh (Right) - 42 minutes     Complications- None  Condition-PACU - hemodynamically stable.   Brief Clinical Note  [name] is a 64 y.o. year old male with end stage OA of both knees with progressively worsening pain and dysfunction. He has constant pain, with activity and at rest and significant functional deficits with difficulties even with ADLs. He has had extensive non-op management including analgesics, injections of cortisone and viscosupplements, and home exercise program, but remains in significant pain with significant dysfunction. We discussed replacing both knees in the same setting versus one at a time including procedure, risks, potential complications, rehab course, and pros and cons associated with each and the patient elects to do both knees at the same time. He presents now for bilateral Total Knee Arthroplasty.     Procedure in detail---   The patient is brought into the operating room and positioned supine on the operating table. After successful administration of  General and Epidural,   a tourniquet is placed high on the  Right thigh(s) and the lower extremities are prepped and draped in the usual sterile fashion. Time out is performed by the operating team and then the  Right lower extremity is wrapped in Esmarch, knee flexed and the tourniquet inflated to 300 mmHg.       A midline incision is made with a ten blade through the subcutaneous tissue to the level of the extensor  mechanism. A fresh blade is used to make a medial parapatellar arthrotomy. Soft tissue over the proximal medial tibia is subperiosteally elevated to the joint line with a knife and into the semimembranosus bursa with a Cobb elevator. Soft tissue over the proximal lateral tibia is elevated with attention being paid to avoiding the patellar tendon on the tibial tubercle. The patella is everted, knee flexed 90 degrees and the ACL and PCL are removed. Findings are bone on bone all 3 compartments with severe deficiency of medial tibia and massive global osteophytes.        The drill is used to create a starting hole in the distal femur and the canal is thoroughly irrigated with sterile saline to remove the fatty contents. The 5 degree Right  valgus alignment guide is placed into the femoral canal and the distal femoral cutting block is pinned to remove 10 mm off the distal femur. Resection is made with an oscillating saw.      The tibia is subluxed forward and the menisci are removed. The extramedullary alignment guide is placed referencing proximally at the medial aspect of the tibial tubercle and distally along the second metatarsal axis and tibial crest. The block is pinned to remove 10 mm off the lateral side. The defect was still approximately 8 mm deep medially thus I decided to remove 10 mm of bone medially for placement of a 10 mm wedge. Resection is made with an oscillating saw. Size 5 MBT revision tray is the most appropriate size for the tibia and the proximal tibia is prepared with the modular drill  and keel punch for that size. A 10 mm wedge is placed medially for the trial.      The femoral sizing guide is placed and size 5 is most appropriate. Rotation is marked off the epicondylar axis and confirmed by creating a rectangular flexion gap at 90 degrees. The size 5 cutting block is pinned in this rotation and the anterior, posterior and chamfer cuts are made with the oscillating saw. The intercondylar  block is then placed and that cut is made.      Trial size 5 tibial component with a 10 mm medial wedge, trial size 5 posterior stabilized femur and a 15  mm posterior stabilized rotating platform insert trial is placed. Full extension is achieved with excellent varus/valgus and anterior/posterior balance throughout full range of motion. The patella is everted and thickness measured to be 27  mm. Free hand resection is taken to 15 mm, a 41 template is placed, lug holes are drilled, trial patella is placed, and it tracks normally. Osteophytes are removed off the posterior femur with the trial in place. All trials are removed and the cut bone surfaces prepared with pulsatile lavage. Cement is mixed and once ready for implantation, the size 5 tibial implant with a 10 mm medial wedge, size  5 posterior stabilized femoral component, and the size 41 patella are cemented in place and the patella is held with the clamp. The trial insert is placed and the knee held in full extension.  All extruded cement is removed and once the cement is hard the permanent 15 mm posterior stabilized rotating platform insert is placed into the tibial tray.      The wound is copiously irrigated with saline solution and the extensor mechanism closed over a hemovac drain with #1 V-loc suture. The tourniquet is released for a total tourniquet time of 43  minutes. Flexion against gravity is 140 degrees and the patella tracks normally. Subcutaneous tissue is closed with 2.0 vicryl and subcuticular with running 4.0 Monocryl.       The  Left lower extremity is wrapped in Esmarch, knee flexed and the tourniquet inflated to 300 mmHg.       A midline incision is made with a ten blade through the subcutaneous tissue to the level of the extensor mechanism. A fresh blade is used to make a medial parapatellar arthrotomy. Soft tissue over the proximal medial tibia is subperiosteally elevated to the joint line with a knife and into the semimembranosus  bursa with a Cobb elevator. Soft tissue over the proximal lateral tibia is elevated with attention being paid to avoiding the patellar tendon on the tibial tubercle. The patella is everted, knee flexed 90 degrees and the ACL and PCL are removed. Findings are bone on bone all 3 compartments with severe deficiency of medial tibia and massive global osteophytes.     The drill is used to create a starting hole in the distal femur and the canal is thoroughly irrigated with sterile saline to remove the fatty contents. The 5 degree Left  valgus alignment guide is placed into the femoral canal and the distal femoral cutting block is pinned to remove 10 mm off the distal femur. Resection is made with an oscillating saw.      The tibia is subluxed forward and the menisci are removed. The extramedullary alignment guide is placed referencing proximally at the medial aspect of the tibial tubercle and distally along the second metatarsal axis and tibial crest. The block is pinned to  remove 10 mm off the lateral side. The defect was still approximately 8 mm deep medially thus I decided to remove 10 mm of bone medially for placement of a 10 mm wedge. Resection is made with an oscillating saw. Size 4 MBT revision tray is the most appropriate size for the tibia and the proximal tibia is prepared with the modular drill and keel punch for that size. A 10 mm wedge is placed medially for the trial.      The femoral sizing guide is placed and size 4 is most appropriate. Rotation is marked off the epicondylar axis and confirmed by creating a rectangular flexion gap at 90 degrees. The size 4 cutting block is pinned in this rotation and the anterior, posterior and chamfer cuts are made with the oscillating saw. The intercondylar block is then placed and that cut is made.      Trial size 4 tibial component with a 10 mm medial wedge, trial size 4 posterior stabilized femur and a 10  mm posterior stabilized rotating platform insert trial is  placed. Full extension is achieved with excellent varus/valgus and anterior/posterior balance throughout full range of motion. The patella is everted and thickness measured to be 27  mm. Free hand resection is taken to 15 mm, a 41 template is placed, lug holes are drilled, trial patella is placed, and it tracks normally. Osteophytes are removed off the posterior femur with the trial in place. All trials are removed and the cut bone surfaces prepared with pulsatile lavage. Cement is mixed and once ready for implantation, the size 4 tibial implant with a 10 mm medial wedge, size  4 posterior stabilized femoral component, and the size 41 patella are cemented in place and the patella is held with the clamp. The trial insert is placed and the knee held in full extension.   All extruded cement is removed and once the cement is hard the permanent 10 mm posterior stabilized rotating platform insert is placed into the tibial tray.      The wound is copiously irrigated with saline solution and the extensor mechanism closed over a hemovac drain with #1 V-loc suture. The tourniquet is released for a total tourniquet time of 37  minutes. Flexion against gravity is 140 degrees and the patella tracks normally. Subcutaneous tissue is closed with 2.0 vicryl and subcuticular with running 4.0 Monocryl. The incisions are cleaned and dried and steri-strips and  bulky sterile dressings are applied. The limbs are placed into knee immobilizers and the patient is awakened and transported to recovery in stable condition.            Please note that a surgical assistant was a medical necessity for this procedure in order to perform it in a safe and expeditious manner. Surgical assistant was necessary to retract the ligaments and vital neurovascular structures to prevent injury to them and also necessary for proper positioning of the limb to allow for anatomic placement of the prosthesis.   Peter Plover Aasir Daigler, MD    03/09/2015, 12:58  PM

## 2015-03-09 NOTE — Anesthesia Postprocedure Evaluation (Signed)
  Anesthesia Post-op Note  Patient: [name]  Procedure(s) Performed: Procedure(s) (LRB): TOTAL KNEE BILATERAL (Bilateral)  Patient Location: PACU  Anesthesia Type: General combined with epidural for post-op pain control  Level of Consciousness: awake and alert   Airway and Oxygen Therapy: Patient Spontanous Breathing  Post-op Pain: mild  Post-op Assessment: Post-op Vital signs reviewed, Patient's Cardiovascular Status Stable, Respiratory Function Stable, Patent Airway and No signs of Nausea or vomiting  Last Vitals:  Filed Vitals:   03/09/15 1645  BP: 90/47  Pulse: 68  Temp: 36.4 C  Resp: 10    Post-op Vital Signs: stable   Complications: No apparent anesthesia complications

## 2015-03-09 NOTE — Progress Notes (Signed)
ANTICOAGULATION CONSULT NOTE - Initial Consult  Pharmacy Consult for warfarin Indication: VTE prophylaxis  Allergies  Allergen Reactions  . Statins Other (See Comments)    Messes with liver function  . Codeine     REACTION: nausea  . Tape Rash    Patient Measurements: Height: 5' 9"  (175.3 cm) Weight: 212 lb (96.163 kg) IBW/kg (Calculated) : 70.7 Heparin Dosing Weight:   Vital Signs: Temp: 97.5 F (36.4 C) (10/19 1548) Temp Source: Oral (10/19 1548) BP: 103/50 mmHg (10/19 1548) Pulse Rate: 64 (10/19 1548)  Labs: No results for input(s): HGB, HCT, PLT, APTT, LABPROT, INR, HEPARINUNFRC, CREATININE, CKTOTAL, CKMB, TROPONINI in the last 72 hours.  Estimated Creatinine Clearance: 89.7 mL/min (by C-G formula based on Cr of 0.99).   Medical History: Past Medical History  Diagnosis Date  . MVP (mitral valve prolapse)   . MR (mitral regurgitation)     severe; mitral valve repair in march 2009  . Atrial flutter ([place])     s/p ablation  . Hyperlipidemia   . CAD (coronary artery disease)     cath 06/2007 40% LM  . CVD (cerebrovascular disease)     59% right ICA, 49% left ICA; h/oTIA; fu study 8/11 with normal carotids  . History of colonoscopy   . Osteoarthritis     both knees  . Dysrhythmia   . Heart murmur   . Shortness of breath dyspnea     increased exertion   . History of bronchitis   . Gait abnormality   . Horseshoe kidney     Assessment: 20 YOM s/p BL knee replacement. Pharmacy asked to dose warfarin starting 10/20 at 0900.  He has epidural in place  Baseline INR: 1.03  Goal of Therapy:  INR 2-3   Plan:   Warfarin 68m PO x 1 at 0900 10/20  Increase dose once epidural removed then start of enoxaparin per ortho  Daily INR  Warfarin education materials  Warfarin education by pharmacist prior to discharge  DDoreene Eland PharmD, BCPS.   Pager: [phone]/19/2016,4:22 PM

## 2015-03-10 ENCOUNTER — Encounter (HOSPITAL_COMMUNITY): Payer: Self-pay | Admitting: Orthopedic Surgery

## 2015-03-10 DIAGNOSIS — R000 Tachycardia, unspecified: Secondary | ICD-10-CM

## 2015-03-10 DIAGNOSIS — I2510 Atherosclerotic heart disease of native coronary artery without angina pectoris: Secondary | ICD-10-CM

## 2015-03-10 DIAGNOSIS — Z96653 Presence of artificial knee joint, bilateral: Secondary | ICD-10-CM

## 2015-03-10 DIAGNOSIS — K5903 Drug induced constipation: Secondary | ICD-10-CM

## 2015-03-10 DIAGNOSIS — Z471 Aftercare following joint replacement surgery: Secondary | ICD-10-CM

## 2015-03-10 DIAGNOSIS — D62 Acute posthemorrhagic anemia: Secondary | ICD-10-CM

## 2015-03-10 LAB — BASIC METABOLIC PANEL
ANION GAP: 6 (ref 5–15)
BUN: 14 mg/dL (ref 6–20)
CHLORIDE: 111 mmol/L (ref 101–111)
CO2: 24 mmol/L (ref 22–32)
Calcium: 8.6 mg/dL — ABNORMAL LOW (ref 8.9–10.3)
Creatinine, Ser: 0.88 mg/dL (ref 0.61–1.24)
GFR calc Af Amer: >60 mL/min (ref >60)
GLUCOSE: 118 mg/dL — AB (ref 65–99)
POTASSIUM: 3.7 mmol/L (ref 3.5–5.1)
Sodium: 141 mmol/L (ref 135–145)

## 2015-03-10 LAB — PROTIME-INR
INR: 1.2 (ref 0.00–1.49)
Prothrombin Time: 15.4 seconds — ABNORMAL HIGH (ref 11.6–15.2)

## 2015-03-10 LAB — CBC
HEMATOCRIT: 36.7 % — AB (ref 39.0–52.0)
HEMOGLOBIN: 12.3 g/dL — AB (ref 13.0–17.0)
MCH: 30.9 pg (ref 26.0–34.0)
MCHC: 33.5 g/dL (ref 30.0–36.0)
MCV: 92.2 fL (ref 78.0–100.0)
PLATELETS: 164 10*3/uL (ref 150–400)
RBC: 3.98 MIL/uL — AB (ref 4.22–5.81)
RDW: 12.7 % (ref 11.5–15.5)
WBC: 10.4 10*3/uL (ref 4.0–10.5)

## 2015-03-10 MED ORDER — NALOXEGOL OXALATE 25 MG PO TABS
25.0000 mg | ORAL_TABLET | Freq: Every day | ORAL | Status: DC
  Administered 2015-03-10 – 2015-03-14 (×5): 25 mg via ORAL
  Filled 2015-03-10 (×5): qty 1

## 2015-03-10 MED ORDER — ENOXAPARIN SODIUM 30 MG/0.3ML ~~LOC~~ SOLN
30.0000 mg | Freq: Two times a day (BID) | SUBCUTANEOUS | Status: DC
  Administered 2015-03-11 – 2015-03-14 (×8): 30 mg via SUBCUTANEOUS
  Filled 2015-03-10 (×9): qty 0.3

## 2015-03-10 MED ORDER — BUPIVACAINE HCL (PF) 0.25 % IJ SOLN
INTRAMUSCULAR | Status: AC
  Filled 2015-03-10: qty 30

## 2015-03-10 NOTE — Progress Notes (Signed)
Pt has not gained any further analgesia from the epidural catheter.  It was removed with catheter fully intact.  Sterile dressing was applied to the site.  Albertha Ghee, MD

## 2015-03-10 NOTE — Progress Notes (Signed)
Subjective: 1 Day Post-Op Procedure(s) (LRB): TOTAL KNEE BILATERAL (Bilateral) Patient reports pain as mild.   Patient seen in rounds by Dr. Wynelle Link. Patient is well, and has had no acute complaints or problems We will start therapy today.  Plan is to go Rehab after hospital stay.  Objective: Vital signs in last 24 hours: Temp:  [97.4 F (36.3 C)-97.9 F (36.6 C)] 97.5 F (36.4 C) (10/20 0621) Pulse Rate:  [58-82] 65 (10/20 0147) Resp:  [9-21] 16 (10/20 0621) BP: (82-139)/(43-89) 116/67 mmHg (10/20 0621) SpO2:  [94 %-100 %] 100 % (10/20 0621) Weight:  [96.163 kg (212 lb)] 96.163 kg (212 lb) (10/19 1548)  Intake/Output from previous day:  Intake/Output Summary (Last 24 hours) at 03/10/15 0728 Last data filed at 03/10/15 0622  Gross per 24 hour  Intake   5100 ml  Output   2650 ml  Net   2450 ml    Intake/Output this shift: UOP 1725 since around [place]  Labs:  Recent Labs  03/10/15 0525  HGB 12.3*    Recent Labs  03/10/15 0525  WBC 10.4  RBC 3.98*  HCT 36.7*  PLT 164    Recent Labs  03/10/15 0525  NA 141  K 3.7  CL 111  CO2 24  BUN 14  CREATININE 0.88  GLUCOSE 118*  CALCIUM 8.6*    Recent Labs  03/10/15 0525  INR 1.20    EXAM General - Patient is Alert, Appropriate and Oriented Extremity - Neurovascular intact Sensation intact distally Dorsiflexion/Plantar flexion intact Dressing - dressing C/D/I Motor Function - intact, moving feet and toes well on exam.  Both Hemovacs pulled without difficulty.  Past Medical History  Diagnosis Date  . MVP (mitral valve prolapse)   . MR (mitral regurgitation)     severe; mitral valve repair in march 2009  . Atrial flutter ([place])     s/p ablation  . Hyperlipidemia   . CAD (coronary artery disease)     cath 06/2007 40% LM  . CVD (cerebrovascular disease)     59% right ICA, 49% left ICA; h/oTIA; fu study 8/11 with normal carotids  . History of colonoscopy   . Osteoarthritis     both knees  .  Dysrhythmia   . Heart murmur   . Shortness of breath dyspnea     increased exertion   . History of bronchitis   . Gait abnormality   . Horseshoe kidney     Assessment/Plan: 1 Day Post-Op Procedure(s) (LRB): TOTAL KNEE BILATERAL (Bilateral) Principal Problem:   OA (osteoarthritis) of knee  Estimated body mass index is 31.29 kg/(m^2) as calculated from the following:   Height as of this encounter: 5' 9"  (1.753 m).   Weight as of this encounter: 96.163 kg (212 lb). Advance diet Up with therapy Continue foley due to urinary output monitoring and and indwelling epidural catheter Continue foley for now.  Will keep foley until tomorrow and will not be removed until at least 6-8 hours following the removal of the epidural catheter.  DVT Prophylaxis - Lovenox and Coumadin, Lovenox will not start until tomorrow afternoon following removal of the epidural. First dose of Coumadin this evening. Weight-Bearing as tolerated to both leg  Continue O2 and Pulse OX   Take Coumadin for four weeks and then discontinue.  The dose may need to be adjusted based upon the INR.  Please follow the INR and titrate Coumadin dose for a therapeutic range between 2.0 and 3.0 INR.  After completing the  four weeks of Coumadin, the patient may stop the Coumadin and resume their 81 mg Aspirin daily.  Lovenox injections will start tomorrow evening after the epidural has been removed and continue until the INR is therapeutic at or greater than 2.0.  When INR reaches the therapeutic level of equal to or greater than 2.0, the patient may discontinue the Lovenox injections.  Arlee Muslim, PA-C Orthopaedic Surgery 03/10/2015, 7:28 AM

## 2015-03-10 NOTE — Evaluation (Signed)
Physical Therapy Evaluation Patient Details Name: [name] MRN: [id number] DOB: 05-14-1951 Today's Date: 03/10/2015   History of Present Illness  Pt admitted for bilateral TKA;   Clinical Impression  Pt s/p bil TKR presents with decreased Bil LE strength/ROM and post op pain limiting functional mobility.  Pt would benefit from follow up rehab at CIR level to maximize IND and safety prior to return home.    Follow Up Recommendations CIR    Equipment Recommendations  None recommended by PT    Recommendations for Other Services OT consult     Precautions / Restrictions Precautions Precautions: Knee Required Braces or Orthoses: Knee Immobilizer - Right;Knee Immobilizer - Left Knee Immobilizer - Right: Discontinue once straight leg raise with < 10 degree lag Knee Immobilizer - Left: Discontinue once straight leg raise with < 10 degree lag Restrictions Weight Bearing Restrictions: No Other Position/Activity Restrictions: WBAT      Mobility  Bed Mobility Overal bed mobility: Needs Assistance Bed Mobility: Supine to Sit     Supine to sit: +2 for physical assistance;+2 for safety/equipment;Mod assist     General bed mobility comments: assist for bringing LEs over EOB. pt did well with bringing trunk forward from the bed--cues to use UEs to self assist with scooting to EOB.  Transfers Overall transfer level: Needs assistance Equipment used: Rolling walker (2 wheeled) Transfers: Sit to/from Stand Sit to Stand: +2 physical assistance;+2 safety/equipment;Mod assist;From elevated surface         General transfer comment:  assist to rise and steady. verbal cues for technique.  Ambulation/Gait Ambulation/Gait assistance: Mod assist;+2 physical assistance;+2 safety/equipment Ambulation Distance (Feet): 7 Feet Assistive device: Rolling walker (2 wheeled) Gait Pattern/deviations: Step-to pattern;Decreased step length - right;Decreased step length - left;Shuffle;Trunk  flexed Gait velocity: decr   General Gait Details: cues for sequence, posture and position from [employer]            Wheelchair Mobility    Modified Rankin (Stroke Patients Only)       Balance                                             Pertinent Vitals/Pain Pain Assessment: 0-10 Pain Score: 8  Pain Location: Bil knees Pain Descriptors / Indicators: Aching;Sore Pain Intervention(s): Limited activity within patient's tolerance;Monitored during session;Premedicated before session;Ice applied    Home Living Family/patient expects to be discharged to:: Inpatient rehab Living Arrangements: Spouse/significant other                    Prior Function Level of Independence: Independent with assistive device(s)         Comments: Pt states using RW prior to admit     Hand Dominance        Extremity/Trunk Assessment   Upper Extremity Assessment: Overall WFL for tasks assessed           Lower Extremity Assessment: LLE deficits/detail;RLE deficits/detail      Cervical / Trunk Assessment: Normal  Communication   Communication: No difficulties  Cognition Arousal/Alertness: Awake/alert Behavior During Therapy: WFL for tasks assessed/performed Overall Cognitive Status: Within Functional Limits for tasks assessed                      General Comments      Exercises        Assessment/Plan  PT Assessment Patient needs continued PT services  PT Diagnosis Difficulty walking   PT Problem List Decreased strength;Decreased range of motion;Decreased activity tolerance;Decreased mobility;Decreased knowledge of use of DME;Pain;Obesity  PT Treatment Interventions DME instruction;Gait training;Functional mobility training;Therapeutic activities;Therapeutic exercise;Patient/family education   PT Goals (Current goals can be found in the Care Plan section) Acute Rehab PT Goals Patient Stated Goal: go to rehab and return to  independence. PT Goal Formulation: With patient Potential to Achieve Goals: Good    Frequency 7X/week   Barriers to discharge        Co-evaluation PT/OT/SLP Co-Evaluation/Treatment: Yes Reason for Co-Treatment: Complexity of the patient's impairments (multi-system involvement);For patient/therapist safety PT goals addressed during session: Mobility/safety with mobility OT goals addressed during session: ADL's and self-care       End of Session   Activity Tolerance: Patient tolerated treatment well;Patient limited by pain Patient left: in chair;with call bell/phone within reach Nurse Communication: Mobility status         Time: 0175-1025 PT Time Calculation (min) (ACUTE ONLY): 38 min   Charges:   PT Evaluation $Initial PT Evaluation Tier I: 1 Procedure PT Treatments $Gait Training: 8-22 mins   PT G Codes:        Hue Frick April 02, 2015, 2:49 PM

## 2015-03-10 NOTE — Progress Notes (Signed)
Rehab admissions - Noted patient would like inpatient rehab here at [facility].  I will open the case with [employer] and seek authorization for inpatient rehab admission.  Likely will see patient in am tomorrow.  Call me for questions.  #[phone]

## 2015-03-10 NOTE — Progress Notes (Signed)
Patient has had no relief since Dr. [name] re-secured epidural catheter and administered Bupivacaine approximately 1-1/2 hours ago. Patient wishes to have catheter removed. Dr. [name] notified.

## 2015-03-10 NOTE — Progress Notes (Signed)
CSW met with pt to assist with d/c planning. Pt is hoping to have [facility] Rehab at [facility]. CSW is available to assist with d/c planning needs if CIR is unable to accept pt for rehab.   Roselyn Reef Tyrek Lawhorn LCSW [phone]

## 2015-03-10 NOTE — Progress Notes (Signed)
ANTICOAGULATION CONSULT NOTE - Follow up [place] for Warfarin, Lovenox Indication: VTE prophylaxis  Allergies  Allergen Reactions  . Statins Other (See Comments)    Messes with liver function  . Codeine     REACTION: nausea  . Tape Rash    Patient Measurements: Height: 5' 9"  (175.3 cm) Weight: 212 lb (96.163 kg) IBW/kg (Calculated) : 70.7  Vital Signs: Temp: 98.2 F (36.8 C) (10/20 1133) Temp Source: Oral (10/20 1133) BP: 148/87 mmHg (10/20 1133) Pulse Rate: 93 (10/20 1133)  Labs:  Recent Labs  03/10/15 0525  HGB 12.3*  HCT 36.7*  PLT 164  LABPROT 15.4*  INR 1.20  CREATININE 0.88    Estimated Creatinine Clearance: 100.9 mL/min (by C-G formula based on Cr of 0.88).   Medical History: Past Medical History  Diagnosis Date  . MVP (mitral valve prolapse)   . MR (mitral regurgitation)     severe; mitral valve repair in march 2009  . Atrial flutter ([place])     s/p ablation  . Hyperlipidemia   . CAD (coronary artery disease)     cath 06/2007 40% LM  . CVD (cerebrovascular disease)     59% right ICA, 49% left ICA; h/oTIA; fu study 8/11 with normal carotids  . History of colonoscopy   . Osteoarthritis     both knees  . Dysrhythmia   . Heart murmur   . Shortness of breath dyspnea     increased exertion   . History of bronchitis   . Gait abnormality   . Horseshoe kidney     Assessment: 59 YOM s/p BL knee replacement 10/19 with postop epidural.  Pharmacy is consulted to dose warfarin starting 10/20 at 0900.   Today, 03/10/2015:  INR 1.2, prior to first low-dose warfarin dose given 10/20 at 0830  CBC:  Hgb 12.3 (decreased from baseline 15.6), Plt remain WNL  Drug-drug interactions:  PTA medication Saw-Palmetto may have anticoag effects; recommend holding while on warfarin.  RN reports epidural catheter to be removed 10/20 afternoon.  Awaiting anesthesia.  Goal of Therapy:  INR 2-3   Plan:   Warfarin dose already given  Lovenox 30  mg SQ q12h, starting >4 hours after epidural removal  Discontinue once INR is > 2  Daily INR  Warfarin education by pharmacist prior to discharge  Noted plans for discharge to CIR or SNF.  Gretta Arab PharmD, BCPS Pager [phone] 03/10/2015 3:20 PM

## 2015-03-10 NOTE — Progress Notes (Signed)
Called anesthesiology per Arlee Muslim, PA to report that, according to patient, the epidural is not working. He has full sensation, can wiggles his toes and feel my finger stroking the sole of his feet. Anesthesiology will round on patient this morning.

## 2015-03-10 NOTE — Consult Note (Addendum)
Physical Medicine and Rehabilitation Consult Reason for Consult: Bilateral total knee arthroplasty secondary to end-stage osteoarthritis Referring Physician: Dr.Alusio    HPI: [name] is a 64 y.o.right handed  male with history of MVP, mitral regurgitation, atrial flutter status post ablation maintained on aspirin therapy. Patient lives with his wife independently with a cane and sometimes a walker prior to admission. 2 level home with bedroom and full bathroom upstairs. 3 steps to entry of home. Wife can assist as needed. 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 No relief with conservative care including NSAIDs and/or analgesics, corticosteroid injections ,activity modification. Underwent bilateral total knee arthroplasty 03/09/2015 per Dr. Maureen Ralphs. Hospital course pain management. Weightbearing as tolerated bilateral lower extremities. Coumadin for DVT prophylaxis. Physical and occupational therapy evaluations pending. M.D. has requested physical medicine rehabilitation consult.  Review of Systems  Constitutional: Negative for fever and chills.  HENT: Negative for hearing loss.   Eyes: Negative for blurred vision and double vision.       Presbyopia  Respiratory: Negative for cough and shortness of breath.        DOE  Cardiovascular: Negative for chest pain and leg swelling.  Gastrointestinal: Positive for constipation. Negative for nausea, vomiting and abdominal pain.  Genitourinary: Negative for dysuria and hematuria.  Musculoskeletal: Positive for joint pain.  Skin: Negative for rash.  Neurological: Negative for seizures, loss of consciousness and headaches.  All other systems reviewed and are negative.  Past Medical History  Diagnosis Date  . MVP (mitral valve prolapse)   . MR (mitral regurgitation)     severe; mitral valve repair in march 2009  . Atrial flutter ([place])     s/p ablation  . Hyperlipidemia   . CAD  (coronary artery disease)     cath 06/2007 40% LM  . CVD (cerebrovascular disease)     59% right ICA, 49% left ICA; h/oTIA; fu study 8/11 with normal carotids  . History of colonoscopy   . Osteoarthritis     both knees  . Dysrhythmia   . Heart murmur   . Shortness of breath dyspnea     increased exertion   . History of bronchitis   . Gait abnormality   . Horseshoe kidney    Past Surgical History  Procedure Laterality Date  . Tonsillectomy  1960  . Mitral valve repair  06/2007  . Heart ablation    . Cardiac catheterization    . Back surgery      secondary to ruptured disc  . Total knee arthroplasty Bilateral 03/09/2015    Procedure: TOTAL KNEE BILATERAL;  Surgeon: Gaynelle Arabian, MD;  Location: [facility];  Service: Orthopedics;  Laterality: Bilateral;   Family History  Problem Relation Age of Onset  . Diabetes    . Liver disease Father     cancer  . Hyperlipidemia Father   . Liver cancer Father   . Colon cancer Neg Hx   . Esophageal cancer Neg Hx   . Rectal cancer Neg Hx   . Stomach cancer Neg Hx   . Colitis Daughter   . Crohn's disease Daughter    Social History:  reports that he has never smoked. He has never used smokeless tobacco. He reports that he drinks alcohol. He reports that he does not use illicit drugs. Allergies:  Allergies  Allergen Reactions  . Statins Other (See Comments)    Messes with liver function  . Codeine     REACTION:  nausea  . Tape Rash   Medications Prior to Admission  Medication Sig Dispense Refill  . aspirin EC 81 MG tablet Take 81 mg by mouth daily.      . meloxicam (MOBIC) 15 MG tablet Take 15 mg by mouth daily.    Marland Kitchen omega-3 acid ethyl esters (LOVAZA) 1 G capsule TAKE 2 CAPSULES BY MOUTH TWICE A DAY 120 capsule 5  . Saw Palmetto, Serenoa repens, (SAW PALMETTO PO) Take 2 tablets by mouth daily.     Marland Kitchen amoxicillin (AMOXIL) 500 MG capsule Take 500 mg by mouth as directed. FOR DENTAL WORK ONLY      Home: Home Living Family/patient expects  to be discharged to:: Skilled nursing facility [facility]) Living Arrangements: Spouse/significant other  Functional History:   Functional Status:  Mobility:          ADL:    Cognition: Cognition Orientation Level: Oriented X4    Blood pressure 116/67, pulse 65, temperature 97.5 F (36.4 C), temperature source Oral, resp. rate 16, height 5' 9"  (1.753 m), weight 96.163 kg (212 lb), SpO2 100 %. Physical Exam  Constitutional: He is oriented to person, place, and time. He appears well-developed and well-nourished.  HENT:  Head: Normocephalic and atraumatic.  Eyes: Conjunctivae and EOM are normal.  Neck: Normal range of motion. Neck supple. No thyromegaly present.  Cardiovascular: Regular rhythm.   No murmur heard. Tachycardic  Respiratory: Effort normal and breath sounds normal. No respiratory distress.  GI: Soft. Bowel sounds are normal. He exhibits no distension.  Musculoskeletal: He exhibits edema and tenderness.  Strength b/l UE 5/5 grossly B/l hip flexion 2-/5 hip flex (inhibited by pain), 5/5 ankle dorsi/plantar flexion grossly   Neurological: He is alert and oriented to person, place, and time.  Skin: Skin is warm and dry.  Bilateral knee incisions are dressed appropriately tender  Psychiatric: He has a normal mood and affect. His behavior is normal.    Results for orders placed or performed during the hospital encounter of 03/09/15 (from the past 24 hour(s))  CBC     Status: Abnormal   Collection Time: 03/10/15  5:25 AM  Result Value Ref Range   WBC 10.4 4.0 - 10.5 K/uL   RBC 3.98 (L) 4.22 - 5.81 MIL/uL   Hemoglobin 12.3 (L) 13.0 - 17.0 g/dL   HCT 36.7 (L) 39.0 - 52.0 %   MCV 92.2 78.0 - 100.0 fL   MCH 30.9 26.0 - 34.0 pg   MCHC 33.5 30.0 - 36.0 g/dL   RDW 12.7 11.5 - 15.5 %   Platelets 164 150 - 400 K/uL  Basic metabolic panel     Status: Abnormal   Collection Time: 03/10/15  5:25 AM  Result Value Ref Range   Sodium 141 135 - 145 mmol/L   Potassium 3.7 3.5  - 5.1 mmol/L   Chloride 111 101 - 111 mmol/L   CO2 24 22 - 32 mmol/L   Glucose, Bld 118 (H) 65 - 99 mg/dL   BUN 14 6 - 20 mg/dL   Creatinine, Ser 0.88 0.61 - 1.24 mg/dL   Calcium 8.6 (L) 8.9 - 10.3 mg/dL   GFR calc non Af Amer >60 >60 mL/min   GFR calc Af Amer >60 >60 mL/min   Anion gap 6 5 - 15  Protime-INR     Status: Abnormal   Collection Time: 03/10/15  5:25 AM  Result Value Ref Range   Prothrombin Time 15.4 (H) 11.6 - 15.2 seconds   INR 1.20  0.00 - 1.49   No results found.  Assessment/Plan: Diagnosis: Bilateral total knee arthroplasty secondary to end-stage osteoarthritis. Labs and images independently reviewed.  Old records reviewed and summated above.  Answered various questions from pt and wife regarding disposition, needs, rehab, etc.    1. Does the need for close, 24 hr/day medical supervision in concert with the patient's rehab needs make it unreasonable for this patient to be served in a less intensive setting? Yes 2. Co-Morbidities requiring supervision/potential complications: Constipation (Increase bowel reg as necessary), post-surgical pain, ABLA (Monitor and consider transfusion if pt symptomatic in accordance with increased activity), MVR, CAD (monitor for pain during exercises), tachycardia (monitor with increased exercise, especially in the setting of CAD and valvular dysfunction) 3. Due to bowel management, safety, skin/wound care, disease management, medication administration, pain management and patient education, does the patient require 24 hr/day rehab nursing? Yes 4. Does the patient require coordinated care of a physician, rehab nurse, PT (1.5-2 hrs/day, 5 days/week) and OT (1.5-2 hrs/day, 5 days/week) to address physical and functional deficits in the context of the above medical diagnosis(es)? Yes Addressing deficits in the following areas: balance, endurance, locomotion, strength, transferring, bowel/bladder control, bathing, dressing, toileting and  psychosocial support 5. Can the patient actively participate in an intensive therapy program of at least 3 hrs of therapy per day at least 5 days per week? Potentially 6. The potential for patient to make measurable gains while on inpatient rehab is excellent 7. Anticipated functional outcomes upon discharge from inpatient rehab are modified independent and supervision  with PT, independent and modified independent with OT, n/a with SLP. 8. Estimated rehab length of stay to reach the above functional goals is: 10-14 days. 9. Does the patient have adequate social supports and living environment to accommodate these discharge functional goals? Yes 10. Anticipated D/C setting: Home 11. Anticipated post D/C treatments: HH therapy and Home excercise program 12. Overall Rehab/Functional Prognosis: excellent   Oral pharmacological pain control  Bowel program: consider colace, miralax and/or Senna. PRN suppository Fall precautions Monitor surgical wound and skin especially over pressure sensitive areas Prevent immobility complications: pressure ulcers, contractures, HO Consider modalities, such as TENs, CPM PT/OT consults for mobility strengthening, endurance training and adaptive ADLs   RECOMMENDATIONS: This patient's condition is appropriate for continued rehabilitative care in the following setting: CIR Patient has agreed to participate in recommended program. Yes Note that insurance prior authorization may be required for reimbursement for recommended care.  Comment: Rehab Admissions Coordinator to follow up.  Delice Lesch, MD 03/10/2015

## 2015-03-10 NOTE — Progress Notes (Signed)
Utilization review completed.  

## 2015-03-10 NOTE — Care Management Note (Signed)
Case Management Note  Patient Details  Name: [name] MRN: [id number] Date of Birth: 27-Sep-1950  Subjective/Objective:                   TOTAL KNEE BILATERAL (Bilateral) Action/Plan:  discharge planning Expected Discharge Date:                  Expected Discharge Plan:  [place]  In-House Referral:     Discharge planning Services  CM Consult  Post Acute Care Choice:    Choice offered to:     DME Arranged:    DME Agency:     HH Arranged:    [place] Agency:     Status of Service:  Completed, signed off  Medicare Important Message Given:    Date Medicare IM Given:    Medicare IM give by:    Date Additional Medicare IM Given:    Additional Medicare Important Message give by:     If discussed at [place] of Stay Meetings, dates discussed:    Additional Comments: CM notes pt to go to CIR for rehab; if pt is not accepted to CIR, plan is for SNF; CSW aware.  No other CM needs were communicated. Dellie Catholic, RN 03/10/2015, 12:11 PM

## 2015-03-10 NOTE — Progress Notes (Signed)
Physical Therapy Treatment Patient Details Name: [name] MRN: [id number] DOB: 1951/03/11 Today's Date: 03/10/2015    History of Present Illness Pt admitted for bilateral TKA;     PT Comments    Pt continues cooperative and motivated but ltd by nausea and pain.  Follow Up Recommendations  CIR     Equipment Recommendations  None recommended by PT    Recommendations for Other Services OT consult     Precautions / Restrictions Precautions Precautions: Knee Required Braces or Orthoses: Knee Immobilizer - Right;Knee Immobilizer - Left Knee Immobilizer - Right: Discontinue once straight leg raise with < 10 degree lag Knee Immobilizer - Left: Discontinue once straight leg raise with < 10 degree lag Restrictions Weight Bearing Restrictions: No Other Position/Activity Restrictions: WBAT    Mobility  Bed Mobility Overal bed mobility: Needs Assistance Bed Mobility: Sit to Supine     Supine to sit: +2 for physical assistance;+2 for safety/equipment;Mod assist Sit to supine: Mod assist;+2 for physical assistance   General bed mobility comments: Assist bil LEs and to control trunk  Transfers Overall transfer level: Needs assistance Equipment used: Rolling walker (2 wheeled) Transfers: Sit to/from Stand Sit to Stand: +2 physical assistance;+2 safety/equipment;Mod assist         General transfer comment:  assist to rise and steady. verbal cues for technique.  Ambulation/Gait Ambulation/Gait assistance: Mod assist;+2 safety/equipment Ambulation Distance (Feet): 4 Feet Assistive device: Rolling walker (2 wheeled) Gait Pattern/deviations: Step-to pattern;Decreased step length - right;Decreased step length - left;Shuffle;Trunk flexed Gait velocity: decr   General Gait Details: cues for sequence, posture and position from [employer]            Wheelchair Mobility    Modified Rankin (Stroke Patients Only)       Balance                                     Cognition Arousal/Alertness: Awake/alert Behavior During Therapy: WFL for tasks assessed/performed Overall Cognitive Status: Within Functional Limits for tasks assessed                      Exercises Total Joint Exercises Ankle Circles/Pumps: AROM;Both;15 reps;Supine    General Comments        Pertinent Vitals/Pain Pain Assessment: 0-10 Pain Score: 7  Pain Location: Bil knees Pain Descriptors / Indicators: Aching;Sore Pain Intervention(s): Limited activity within patient's tolerance;Monitored during session;Premedicated before session;Ice applied    Home Living Family/patient expects to be discharged to:: Inpatient rehab Living Arrangements: Spouse/significant other                  Prior Function Level of Independence: Independent with assistive device(s)      Comments: Pt states using RW prior to admit   PT Goals (current goals can now be found in the care plan section) Acute Rehab PT Goals Patient Stated Goal: go to rehab and return to independence. PT Goal Formulation: With patient Potential to Achieve Goals: Good Progress towards PT goals: Progressing toward goals    Frequency  7X/week    PT Plan Current plan remains appropriate    Co-evaluation PT/OT/SLP Co-Evaluation/Treatment: Yes Reason for Co-Treatment: Complexity of the patient's impairments (multi-system involvement);For patient/therapist safety PT goals addressed during session: Mobility/safety with mobility OT goals addressed during session: ADL's and self-care     End of Session   Activity Tolerance: Patient tolerated treatment  well;Patient limited by pain Patient left: in bed;with call bell/phone within reach;with family/visitor present     Time: 1320-1350 PT Time Calculation (min) (ACUTE ONLY): 30 min  Charges:  $Gait Training: 8-22 mins $Therapeutic Activity: 8-22 mins                    G Codes:      Dejean Tribby 2015/04/08, 3:55 PM

## 2015-03-10 NOTE — Evaluation (Addendum)
Occupational Therapy Evaluation Patient Details Name: [name] MRN: [id number] DOB: 16-Nov-1950 Today's Date: 03/10/2015    History of Present Illness Pt admitted for bilateral TKA;    Clinical Impression   Pt is motivated and participating well in therapy. He stood for [employer] assist and took some steps in the room with the walker this visit. Educated started on AE options for LB self care to increase independence. Will follow on acute to progress ADL independence.     Follow Up Recommendations  CIR;Supervision/Assistance - 24 hour    Equipment Recommendations  Other (comment) (TBA next venue)    Recommendations for Other Services       Precautions / Restrictions Precautions Precautions: Knee Required Braces or Orthoses: Knee Immobilizer - Right;Knee Immobilizer - Left Knee Immobilizer - Right: Discontinue once straight leg raise with < 10 degree lag Knee Immobilizer - Left: Discontinue once straight leg raise with < 10 degree lag Restrictions Weight Bearing Restrictions: No Other Position/Activity Restrictions: WBAT      Mobility Bed Mobility Overal bed mobility: Needs Assistance Bed Mobility: Supine to Sit     Supine to sit: +2 for physical assistance;+2 for safety/equipment;Mod assist     General bed mobility comments: assist for bringing LEs over EOB. pt did well with bringing trunk forward from the bed--cues to use UEs to self assist with scooting to EOB.  Transfers Overall transfer level: Needs assistance Equipment used: Rolling walker (2 wheeled) Transfers: Sit to/from Stand Sit to Stand: +2 physical assistance;+2 safety/equipment;Mod assist;From elevated surface         General transfer comment:  assist to rise and steady. verbal cues for technique.    Balance                                            ADL Overall ADL's : Needs assistance/impaired Eating/Feeding: Independent;Sitting   Grooming: Wash/dry  face;Set up;Bed level   Upper Body Bathing: Minimal assitance;Bed level   Lower Body Bathing: +2 for physical assistance;+2 for safety/equipment;Maximal assistance;Sit to/from stand   Upper Body Dressing : Minimal assistance;Sitting   Lower Body Dressing: +2 for physical assistance;Total assistance;Sit to/from stand;+2 for safety/equipment   Toilet Transfer: +2 for physical assistance;+2 for safety/equipment;Moderate assistance;Ambulation;RW   Toileting- Clothing Manipulation and Hygiene: +2 for safety/equipment;+2 for physical assistance;Total assistance;Sit to/from stand         General ADL Comments: Pt unable to let go of walker currently to use UEs in functional ADL to wash periareas or pull up clothing. He is very motivated and wants to do what he can. Educated pt and spouse on AE options for LB self care and he is interested in Ge[employer], engineering.      Vision     Perception     Praxis      Pertinent Vitals/Pain Pain Assessment: 0-10 Pain Score: 8  Pain Location: bilateral knee Pain Descriptors / Indicators: Aching Pain Intervention(s): Repositioned;Ice applied     Hand Dominance     Extremity/Trunk Assessment Upper Extremity Assessment Upper Extremity Assessment: Overall WFL for tasks assessed           Communication Communication Communication: No difficulties   Cognition Arousal/Alertness: Awake/alert Behavior During Therapy: WFL for tasks assessed/performed Overall Cognitive Status: Within Functional Limits for tasks assessed  General Comments       Exercises       Shoulder Instructions      Home Living Family/patient expects to be discharged to:: Inpatient rehab Living Arrangements: Spouse/significant other                                      Prior Functioning/Environment          Comments: pt states he could dress himself and perform other ADL but with difficulty.    OT Diagnosis: Generalized  weakness   OT Problem List: Decreased strength;Decreased knowledge of use of DME or AE   OT Treatment/Interventions: Self-care/ADL training;Patient/family education;Therapeutic activities;DME and/or AE instruction    OT Goals(Current goals can be found in the care plan section) Acute Rehab OT Goals Patient Stated Goal: go to rehab and return to independence. OT Goal Formulation: With patient Time For Goal Achievement: 03/17/15 Potential to Achieve Goals: Good  OT Frequency: Min 2X/week   Barriers to D/C:            Co-evaluation PT/OT/SLP Co-Evaluation/Treatment: Yes Reason for Co-Treatment: For patient/therapist safety   OT goals addressed during session: ADL's and self-care;Proper use of Adaptive equipment and DME      End of Session Equipment Utilized During Treatment: Rolling walker;Right knee immobilizer;Left knee immobilizer  Activity Tolerance: Patient limited by pain Patient left: in chair;with call bell/phone within reach   Time: 1155-1232 OT Time Calculation (min): 37 min Charges:  OT General Charges $OT Visit: 1 Procedure OT Evaluation $Initial OT Evaluation Tier I: 1 Procedure G-Codes:    Jules Schick  [phone] 03/10/2015, 1:36 PM

## 2015-03-10 NOTE — Progress Notes (Signed)
Pt is POD #1 s/p B/L TKA who has a lumbar epidural in place for analgesia.  He states that at this point he is not getting any pain relief from the epidural and is able to feel everything in both legs and they feel completely normal.  He has been on an infusion pump of Ropivicaine for 24hrs.  He initially had good analgesia with the epidural but feels it is no longer helping.  Upon inspection, the catheter is secured well with tape and showing no signs of leaking medication.  The catheter depth was 8 cm into the back.  In an attempt to reposition the catheter tip, I pulled it out to a depth of 7 cm and re-secured it with tape.  Next, 8 mL of 0.25% Bupivacaine was injected through the epidural catheter.  If he does not get relief soon, it will be clear that the catheter has become non-functional/malpositioned and will be removed.  Albertha Ghee, MD

## 2015-03-11 LAB — CBC
HCT: 32.9 % — ABNORMAL LOW (ref 39.0–52.0)
Hemoglobin: 11.1 g/dL — ABNORMAL LOW (ref 13.0–17.0)
MCH: 30.7 pg (ref 26.0–34.0)
MCHC: 33.7 g/dL (ref 30.0–36.0)
MCV: 91.1 fL (ref 78.0–100.0)
Platelets: 182 10*3/uL (ref 150–400)
RBC: 3.61 MIL/uL — ABNORMAL LOW (ref 4.22–5.81)
RDW: 12.8 % (ref 11.5–15.5)
WBC: 11.8 10*3/uL — ABNORMAL HIGH (ref 4.0–10.5)

## 2015-03-11 LAB — BASIC METABOLIC PANEL
Anion gap: 9 (ref 5–15)
BUN: 12 mg/dL (ref 6–20)
CALCIUM: 8.2 mg/dL — AB (ref 8.9–10.3)
CO2: 24 mmol/L (ref 22–32)
CREATININE: 0.76 mg/dL (ref 0.61–1.24)
Chloride: 96 mmol/L — ABNORMAL LOW (ref 101–111)
GFR calc non Af Amer: >60 mL/min (ref >60)
Glucose, Bld: 129 mg/dL — ABNORMAL HIGH (ref 65–99)
Potassium: 3.6 mmol/L (ref 3.5–5.1)
SODIUM: 129 mmol/L — AB (ref 135–145)

## 2015-03-11 LAB — PROTIME-INR
INR: 1.19 (ref 0.00–1.49)
PROTHROMBIN TIME: 15.3 s — AB (ref 11.6–15.2)

## 2015-03-11 MED ORDER — CYCLOBENZAPRINE HCL 5 MG PO TABS
5.0000 mg | ORAL_TABLET | Freq: Three times a day (TID) | ORAL | Status: DC | PRN
  Administered 2015-03-11 – 2015-03-14 (×8): 5 mg via ORAL
  Filled 2015-03-11 (×8): qty 1

## 2015-03-11 MED ORDER — WARFARIN SODIUM 7.5 MG PO TABS
7.5000 mg | ORAL_TABLET | Freq: Once | ORAL | Status: AC
  Administered 2015-03-11: 7.5 mg via ORAL
  Filled 2015-03-11: qty 1

## 2015-03-11 NOTE — PMR Pre-admission (Signed)
PMR Admission Coordinator Pre-Admission Assessment  Patient: [name] is an 64 y.o., male MRN: [id number] DOB: 09-27-1950 Height: 5' 9"  (175.3 cm) Weight: 96.163 kg (212 lb)              Insurance Information HMO:      PPO:       PCP:       IPA:       80/20:       OTHER:  POS II PRIMARY:  [employer] Managed      Policy#: [id number]      Subscriber: Gerrit Friends CM Name: Andre Lefort     Phone#: [phone]     Fax#: [phone] Pre-Cert#: [id number] X 5 days     Employer: [employer] Benefits:  Phone #: [phone]     Name: On line Eff. Date: 05/21/14     Deduct: $500 (met all)      Out of Pocket Max: $2750 (met all)      Life Max: unlimited CIR: 90% w/auth      SNF: 90% w/auth Outpatient: 90% with 60 visit limit     Co-Pay: 10% Home Health: 100%      Co-Pay: none DME: 90%     Co-Pay: 10% Providers: in Therapist, art Information    Name Relation Home Work Mobile   Yowell,Janice Spouse [phone]     Current Medical History  Patient Admitting Diagnosis:  B TKR  History of Present Illness: A 64 y.o.right handed male with history of MVP, mitral regurgitation, atrial flutter status post ablation maintained on aspirin therapy. Patient lives with his wife independently with a cane and sometimes a walker prior to admission. 2 level home with bedroom and full bathroom upstairs. 3 steps to entry of home. Wife can assist as needed. 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 No relief with conservative care including NSAIDs and/or analgesics, corticosteroid injections ,activity modification. Underwent bilateral total knee arthroplasty 03/09/2015 per Dr. Maureen Ralphs. Hospital course pain management. Weightbearing as tolerated bilateral lower extremities. Coumadin for DVT prophylaxis. Acute blood loss anemia 8.3 and monitored. Physical and occupational therapy evaluations completed. M.D. has  requested physical medicine rehabilitation consult. Patient was admitted for comprehensive rehabilitation program Past Medical History  Past Medical History  Diagnosis Date  . MVP (mitral valve prolapse)   . MR (mitral regurgitation)     severe; mitral valve repair in march 2009  . Atrial flutter ([place])     s/p ablation  . Hyperlipidemia   . CAD (coronary artery disease)     cath 06/2007 40% LM  . CVD (cerebrovascular disease)     59% right ICA, 49% left ICA; h/oTIA; fu study 8/11 with normal carotids  . History of colonoscopy   . Osteoarthritis     both knees  . Dysrhythmia   . Heart murmur   . Shortness of breath dyspnea     increased exertion   . History of bronchitis   . Gait abnormality   . Horseshoe kidney     Family History  family history includes Colitis in his daughter; Crohn's disease in his daughter; Diabetes in an other family member; Hyperlipidemia in his father; Liver cancer in his father; Liver disease in his father. There is no history of Colon cancer, Esophageal cancer, Rectal cancer, or Stomach cancer.  Prior Rehab/Hospitalizations: Outpatient therapy at [facility] after back surgery 10/19/14  Has the patient had major surgery during 100 days prior to admission?  No.  Did have back surgery in May, 2016.  Current Medications   Current facility-administered medications:  .  0.9 %  sodium chloride infusion, , Intravenous, Continuous, Arlee Muslim, PA-C, Stopped at 03/11/15 1100 .  acetaminophen (TYLENOL) tablet 650 mg, 650 mg, Oral, Q6H PRN, 650 mg at 03/12/15 1245 **OR** acetaminophen (TYLENOL) suppository 650 mg, 650 mg, Rectal, Q6H PRN, Gaynelle Arabian, MD .  bisacodyl (DULCOLAX) suppository 10 mg, 10 mg, Rectal, Daily PRN, Gaynelle Arabian, MD .  coumadin book, , Does not apply, Once, Berton Mount, RPH .  cyclobenzaprine (FLEXERIL) tablet 5 mg, 5 mg, Oral, TID PRN, Arlee Muslim, PA-C, 5 mg at 03/14/15 1441 .  diphenhydrAMINE (BENADRYL) 12.5 MG/5ML elixir  12.5-25 mg, 12.5-25 mg, Oral, Q4H PRN, Gaynelle Arabian, MD .  docusate sodium (COLACE) capsule 100 mg, 100 mg, Oral, BID, Gaynelle Arabian, MD, 100 mg at 03/14/15 0858 .  enoxaparin (LOVENOX) injection 30 mg, 30 mg, Subcutaneous, Q12H, Gaynelle Arabian, MD, 30 mg at 03/14/15 1302 .  HYDROmorphone (DILAUDID) injection 0.5-1 mg, 0.5-1 mg, Intravenous, Q1H PRN, Gaynelle Arabian, MD, 1 mg at 03/11/15 0148 .  HYDROmorphone (DILAUDID) tablet 2-4 mg, 2-4 mg, Oral, Q4H PRN, Gaynelle Arabian, MD, 4 mg at 03/14/15 1302 .  iron polysaccharides (NIFEREX) capsule 150 mg, 150 mg, Oral, Daily, Arlee Muslim, PA-C, 150 mg at 03/14/15 0858 .  menthol-cetylpyridinium (CEPACOL) lozenge 3 mg, 1 lozenge, Oral, PRN **OR** phenol (CHLORASEPTIC) mouth spray 1 spray, 1 spray, Mouth/Throat, PRN, Gaynelle Arabian, MD .  metoCLOPramide (REGLAN) tablet 5-10 mg, 5-10 mg, Oral, Q8H PRN **OR** metoCLOPramide (REGLAN) injection 5-10 mg, 5-10 mg, Intravenous, Q8H PRN, Gaynelle Arabian, MD .  naloxegol oxalate (MOVANTIK) tablet 25 mg, 25 mg, Oral, Daily, Gaynelle Arabian, MD, 25 mg at 03/14/15 0858 .  ondansetron (ZOFRAN) tablet 4 mg, 4 mg, Oral, Q6H PRN, 4 mg at 03/10/15 2004 **OR** ondansetron (ZOFRAN) injection 4 mg, 4 mg, Intravenous, Q6H PRN, Gaynelle Arabian, MD, 4 mg at 03/10/15 1314 .  polyethylene glycol (MIRALAX / GLYCOLAX) packet 17 g, 17 g, Oral, Daily PRN, Gaynelle Arabian, MD, 17 g at 03/13/15 1032 .  sodium phosphate (FLEET) 7-19 GM/118ML enema 1 enema, 1 enema, Rectal, Once PRN, Gaynelle Arabian, MD .  traMADol Veatrice Bourbon) tablet 50-100 mg, 50-100 mg, Oral, Q6H PRN, Gaynelle Arabian, MD, 100 mg at 03/14/15 1441 .  warfarin (COUMADIN) video, , Does not apply, Once, Berton Mount, RPH .  Warfarin - Pharmacist Dosing Inpatient, , Does not apply, q1800, Berton Mount, RPH, Stopped at 03/10/15 1800  Patients Current Diet: Diet regular Room service appropriate?: Yes; Fluid consistency:: Thin Diet - low sodium heart healthy  Precautions /  Restrictions Precautions Precautions: Knee, Fall Restrictions Weight Bearing Restrictions: No Other Position/Activity Restrictions: WBAT   Has the patient had 2 or more falls or a fall with injury in the past year?No  Prior Activity Level Community (5-7x/wk): Went out daily.  Worked FT a Geneticist, molecular.  Went to the gym.  Home Assistive Devices / Equipment Home Assistive Devices/Equipment: Eyeglasses  Prior Device Use: Indicate devices/aids used by the patient prior to current illness, exacerbation or injury? Walker and [employer]  Prior Functional Level Prior Function Level of Independence: Independent with assistive device(s) Comments: Pt states using RW prior to admit  Self Care: Did the patient need help bathing, dressing, using the toilet or eating?  Independent  Indoor Mobility: Did the patient need assistance with walking from room to room (with or without device)? Independent  Stairs: Did  the patient need assistance with internal or external stairs (with or without device)? Independent  Functional Cognition: Did the patient need help planning regular tasks such as shopping or remembering to take medications? Independent  Current Functional Level Cognition  Overall Cognitive Status: Within Functional Limits for tasks assessed Orientation Level: Oriented X4    Extremity Assessment (includes Sensation/Coordination)  Upper Extremity Assessment: Overall WFL for tasks assessed  Lower Extremity Assessment: LLE deficits/detail, RLE deficits/detail RLE: Unable to fully assess due to pain LLE: Unable to fully assess due to pain    ADLs  Overall ADL's : Needs assistance/impaired Eating/Feeding: Independent, Sitting Grooming: Wash/dry face, Set up, Bed level Upper Body Bathing: Minimal assitance, Bed level Lower Body Bathing: +2 for physical assistance, +2 for safety/equipment, Maximal assistance, Sit to/from stand Upper Body Dressing : Minimal assistance, Sitting Lower Body  Dressing: +2 for physical assistance, Total assistance, Sit to/from stand, +2 for safety/equipment Toilet Transfer: +2 for physical assistance, +2 for safety/equipment, Moderate assistance, Ambulation, RW Toileting- Clothing Manipulation and Hygiene: +2 for safety/equipment, +2 for physical assistance, Total assistance, Sit to/from stand General ADL Comments: Pt unable to let go of walker currently to use UEs in functional ADL to wash periareas or pull up clothing. He is very motivated and wants to do what he can. Educated pt and spouse on AE options for LB self care and he is interested in Geologist, engineering.     Mobility  Overal bed mobility: Needs Assistance Bed Mobility: Supine to Sit Supine to sit: Min guard Sit to supine: Mod assist General bed mobility comments: used leg lifter to move the legs to the bed edge and to the floor    Transfers  Overall transfer level: Needs assistance Equipment used: Rolling walker (2 wheeled) Transfers: Sit to/from Stand Sit to Stand: From elevated surface, Mod assist General transfer comment: did not use the R KI, bed raised to facilitate standing, able to get the R knee flexed at least to 80 degrees in prep to stand. able to  push self better to stand and walk the legs back.    Ambulation / Gait / Stairs / Wheelchair Mobility  Ambulation/Gait Ambulation/Gait assistance: Min assist, +2 safety/equipment Ambulation Distance (Feet): 90 Feet Assistive device: Rolling walker (2 wheeled) Gait Pattern/deviations: Step-to pattern, Decreased step length - right, Decreased step length - left General Gait Details: removed the L KI after first 50', able to ambulate without either KI, close cues for sequence and to not roll RW when taking a step,  Gait velocity: decr Gait velocity interpretation: Below normal speed for age/gender    Posture / Balance Balance Overall balance assessment: Needs assistance Sitting-balance support: Feet supported, Bilateral upper extremity  supported Sitting balance-Leahy Scale: Fair    Special needs/care consideration BiPAP/CPAP No CPM Yes, bilateral CPM.  Has knee immobilizers in place Continuous Drip IV No Dialysis No         Life Vest No Oxygen No Special Bed No Trach Size No Wound Vac (area) No      Skin B TKR incisions with dressings                              Bowel mgmt: Last BM 03/09/15 Bladder mgmt: Foley catheter removed 03/11/15 Diabetic mgmt No     Previous Home Environment Living Arrangements: Spouse/significant other  Lives With: Spouse, Son, Daughter Available Help at Discharge: Family, Available 24 hours/day Type of Home: House Home Layout: Two level,  1/2 bath on main level, Bed/bath upstairs Alternate Level Stairs-Number of Steps: Flight Home Access: Stairs to enter Technical brewer of Steps: 3 Home Care Services: No  Discharge Living Setting Plans for Discharge Living Setting: Patient's home, House, Lives with (comment) (Lives with wife and children.) Type of Home at Discharge: House Discharge Home Layout: Two level, 1/2 bath on main level, Bed/bath upstairs, Other (Comment) (Has a sleeper sofa and lounge chair main level.) Alternate Level Stairs-Number of Steps: 13 to second level with bedrooms Discharge Home Access: Stairs to enter Entrance Stairs-Number of Steps: 3 to porch entry Does the patient have any problems obtaining your medications?: No  Social/Family/Support Systems Patient Roles: Spouse, Parent (Has a wife, 70 yo son and a 2 yo daughter.) Contact Information: Brodyn Depuy - wife Anticipated Caregiver: wife Anticipated Caregiver's Contact Information: Thayer Headings (c) [phone] Ability/Limitations of Caregiver: Wife stays at home and can assist. Caregiver Availability: 24/7 Discharge Plan Discussed with Primary Caregiver: Yes Is Caregiver In Agreement with Plan?: Yes Does Caregiver/Family have Issues with Lodging/Transportation while Pt is in Rehab?:  No   Goals/Additional Needs Patient/Family Goal for Rehab: PT/OT mod I and supervision goals Expected length of stay: 10-14 days Cultural Considerations: Attends [employer] Dietary Needs: Regular diet, thin liquids Equipment Needs: TBD Pt/Family Agrees to Admission and willing to participate: Yes Program Orientation Provided & Reviewed with Pt/Caregiver Including Roles  & Responsibilities: Yes  Decrease burden of Care through IP rehab admission: N/A  Possible need for SNF placement upon discharge: Not anticipated  Patient Condition: This patient's medical and functional status has changed since the consult dated: 03/10/15 in which the Rehabilitation Physician determined and documented that the patient's condition is appropriate for intensive rehabilitative care in an inpatient rehabilitation facility. See "History of Present Illness" (above) for medical update. Functional changes are: Currently requiring mod assist for transfers and min assist to ambulate 90 ft RW. Patient's medical and functional status update has been discussed with the Rehabilitation physician and patient remains appropriate for inpatient rehabilitation. Will admit to inpatient rehab today.  Preadmission Screen Completed By:  [name], 03/14/2015 3:10 PM ______________________________________________________________________   Discussed status with Dr. Posey Pronto on 03/14/15 at 1509 and received telephone approval for admission today.  Admission Coordinator:  [name], time1509/Date10/24/16

## 2015-03-11 NOTE — Progress Notes (Signed)
ANTICOAGULATION CONSULT NOTE - Follow up [facility] for Warfarin Indication: VTE prophylaxis  Allergies  Allergen Reactions  . Statins Other (See Comments)    Messes with liver function  . Codeine     REACTION: nausea  . Tape Rash    Patient Measurements: Height: 5' 9"  (175.3 cm) Weight: 212 lb (96.163 kg) IBW/kg (Calculated) : 70.7  Vital Signs: Temp: 98 F (36.7 C) (10/21 0626) Temp Source: Oral (10/21 0626) BP: 166/87 mmHg (10/21 0626) Pulse Rate: 94 (10/21 0626)  Labs:  Recent Labs  03/10/15 0525 03/11/15 0515  HGB 12.3* 11.1*  HCT 36.7* 32.9*  PLT 164 182  LABPROT 15.4* 15.3*  INR 1.20 1.19  CREATININE 0.88 0.76    Estimated Creatinine Clearance: 111 mL/min (by C-G formula based on Cr of 0.76).   Assessment: 3 YOM s/p BL knee replacement 10/19 with postop epidural.  Pharmacy is consulted to dose warfarin starting 10/20 at 0900.  Epidural catheter was removed 10/20 ~1630.  Today, 03/11/2015:  INR 1.19, subtherapeutic as expected after first low-dose warfarin dose given 10/20  CBC:  Hgb 11.1 with expected post-op anemia (decreased from preop Hgb 15.6), Plt remain WNL  No bleeding or complications reported.  Drug-drug interactions:  PTA medication [place] may have anticoag effects and meloxicam may increase bleeding risk; recommended holding while on warfarin.  Patient education completed 03/11/15.  Noted plans for discharge to CIR or SNF.  Goal of Therapy:  INR 2-3   Plan:   Warfarin 7.5 mg PO today at 1800  Continue Lovenox 30 mg SQ q12h until INR > 2  Daily INR  Gretta Arab PharmD, BCPS Pager [phone] 03/11/2015 7:28 AM

## 2015-03-11 NOTE — Discharge Instructions (Addendum)
Dr. Gaynelle Arabian Total Joint Specialist [facility] [address] [place], [place] [address] [phone]  TOTAL KNEE REPLACEMENT POSTOPERATIVE DIRECTIONS  Knee Rehabilitation, Guidelines Following Surgery  Results after knee surgery are often greatly improved when you follow the exercise, range of motion and muscle strengthening exercises prescribed by your doctor. Safety measures are also important to protect the knee from further injury. Any time any of these exercises cause you to have increased pain or swelling in your knee joint, decrease the amount until you are comfortable again and slowly increase them. If you have problems or questions, call your caregiver or physical therapist for advice.   HOME CARE INSTRUCTIONS  Remove items at home which could result in a fall. This includes throw rugs or furniture in walking pathways.   ICE to the affected knee every three hours for 30 minutes at a time and then as needed for pain and swelling.  Continue to use ice on the knee for pain and swelling from surgery. You may notice swelling that will progress down to the foot and ankle.  This is normal after surgery.  Elevate the leg when you are not up walking on it.    Continue to use the breathing machine which will help keep your temperature down.  It is common for your temperature to cycle up and down following surgery, especially at night when you are not up moving around and exerting yourself.  The breathing machine keeps your lungs expanded and your temperature down.  Do not place pillow under knee, focus on keeping the knee straight while resting  DIET You may resume your previous home diet once your are discharged from the hospital.  DRESSING / WOUND CARE / SHOWERING You may shower 3 days after surgery, but keep the wounds dry during showering.  You may use an occlusive plastic wrap (Press'n Seal for example), NO SOAKING/SUBMERGING IN THE BATHTUB.  If the  bandage gets wet, change with a clean dry gauze.  If the incision gets wet, pat the wound dry with a clean towel. You may start showering once you are discharged home but do not submerge the incision under water. Just pat the incision dry and apply a dry gauze dressing on daily. Change the surgical dressing daily and reapply a dry dressing each time.  ACTIVITY Walk with your walker as instructed. Use walker as long as suggested by your caregivers. Avoid periods of inactivity such as sitting longer than an hour when not asleep. This helps prevent blood clots.  You may resume a sexual relationship in one month or when given the OK by your doctor.  You may return to work once you are cleared by your doctor.  Do not drive a car for 6 weeks or until released by you surgeon.  Do not drive while taking narcotics.  WEIGHT BEARING Weight bearing as tolerated with assist device (walker, cane, etc) as directed, use it as long as suggested by your surgeon or therapist, typically at least 4-6 weeks.  POSTOPERATIVE CONSTIPATION PROTOCOL Constipation - defined medically as fewer than three stools per week and severe constipation as less than one stool per week.  One of the most common issues patients have following surgery is constipation.  Even if you have a regular bowel pattern at home, your normal regimen is likely to be disrupted due to multiple reasons following surgery.  Combination of anesthesia, postoperative narcotics, change in appetite and fluid intake all can affect your bowels.  In order to avoid complications following surgery, here are some recommendations in order to help you during your recovery period.  Colace (docusate) - Pick up an over-the-counter form of Colace or another stool softener and take twice a day as long as you are requiring postoperative pain medications.  Take with a full glass of water daily.  If you experience loose stools or diarrhea, hold the colace until you stool forms  back up.  If your symptoms do not get better within 1 week or if they get worse, check with your doctor.  Dulcolax (bisacodyl) - Pick up over-the-counter and take as directed by the product packaging as needed to assist with the movement of your bowels.  Take with a full glass of water.  Use this product as needed if not relieved by Colace only.   MiraLax (polyethylene glycol) - Pick up over-the-counter to have on hand.  MiraLax is a solution that will increase the amount of water in your bowels to assist with bowel movements.  Take as directed and can mix with a glass of water, juice, soda, coffee, or tea.  Take if you go more than two days without a movement. Do not use MiraLax more than once per day. Call your doctor if you are still constipated or irregular after using this medication for 7 days in a row.  If you continue to have problems with postoperative constipation, please contact the office for further assistance and recommendations.  If you experience "the worst abdominal pain ever" or develop nausea or vomiting, please contact the office immediatly for further recommendations for treatment.  ITCHING  If you experience itching with your medications, try taking only a single pain pill, or even half a pain pill at a time.  You can also use Benadryl over the counter for itching or also to help with sleep.   TED HOSE STOCKINGS Wear the elastic stockings on both legs for three weeks following surgery during the day but you may remove then at night for sleeping.  MEDICATIONS See your medication summary on the After Visit Summary that the nursing staff will review with you prior to discharge.  You may have some home medications which will be placed on hold until you complete the course of blood thinner medication.  It is important for you to complete the blood thinner medication as prescribed by your surgeon.  Continue your approved medications as instructed at time of  discharge.  PRECAUTIONS If you experience chest pain or shortness of breath - call 911 immediately for transfer to the hospital emergency department.  If you develop a fever greater that 101 F, purulent drainage from wound, increased redness or drainage from wound, foul odor from the wound/dressing, or calf pain - CONTACT YOUR SURGEON.                                                   FOLLOW-UP APPOINTMENTS Make sure you keep all of your appointments after your operation with your surgeon and caregivers. You should call the office at the above phone number and make an appointment for approximately two weeks after the date of your surgery or on the date instructed by your surgeon outlined in the "After Visit Summary".   RANGE OF MOTION AND STRENGTHENING EXERCISES  Rehabilitation of the knee is important following a knee injury or  an operation. After just a few days of immobilization, the muscles of the thigh which control the knee become weakened and shrink (atrophy). Knee exercises are designed to build up the tone and strength of the thigh muscles and to improve knee motion. Often times heat used for twenty to thirty minutes before working out will loosen up your tissues and help with improving the range of motion but do not use heat for the first two weeks following surgery. These exercises can be done on a training (exercise) mat, on the floor, on a table or on a bed. Use what ever works the best and is most comfortable for you Knee exercises include:  Leg Lifts - While your knee is still immobilized in a splint or cast, you can do straight leg raises. Lift the leg to 60 degrees, hold for 3 sec, and slowly lower the leg. Repeat 10-20 times 2-3 times daily. Perform this exercise against resistance later as your knee gets better.  Quad and Hamstring Sets - Tighten up the muscle on the front of the thigh (Quad) and hold for 5-10 sec. Repeat this 10-20 times hourly. Hamstring sets are done by pushing the  foot backward against an object and holding for 5-10 sec. Repeat as with quad sets.   Leg Slides: Lying on your back, slowly slide your foot toward your buttocks, bending your knee up off the floor (only go as far as is comfortable). Then slowly slide your foot back down until your leg is flat on the floor again.  Angel Wings: Lying on your back spread your legs to the side as far apart as you can without causing discomfort.  A rehabilitation program following serious knee injuries can speed recovery and prevent re-injury in the future due to weakened muscles. Contact your doctor or a physical therapist for more information on knee rehabilitation.   IF YOU ARE TRANSFERRED TO A SKILLED REHAB FACILITY If the patient is transferred to a skilled rehab facility following release from the hospital, a list of the current medications will be sent to the facility for the patient to continue.  When discharged from the skilled rehab facility, please have the facility set up the patient's [place] prior to being released. Also, the skilled facility will be responsible for providing the patient with their medications at time of release from the facility to include their pain medication, the muscle relaxants, and their blood thinner medication. If the patient is still at the rehab facility at time of the two week follow up appointment, the skilled rehab facility will also need to assist the patient in arranging follow up appointment in our office and any transportation needs.  MAKE SURE YOU:  Understand these instructions.  Get help right away if you are not doing well or get worse.    Pick up stool softner and laxative for home use following surgery while on pain medications. Do not submerge incision under water. Please use good hand washing techniques while changing dressing each day. May shower starting three days after surgery. Please use a clean towel to pat the incision dry following  showers. Continue to use ice for pain and swelling after surgery. Do not use any lotions or creams on the incision until instructed by your surgeon.  Take Coumadin for four weeks and then discontinue.  The dose may need to be adjusted based upon the INR.  Please follow the INR and titrate Coumadin dose for a therapeutic range between 2.0 and  3.0 INR.  After completing the four weeks of Coumadin, the patient may stop the Coumadin and resume their 81 mg Aspirin daily.  Continue Lovenox injections until the INR is therapeutic at or greater than 2.0.  When INR reaches the therapeutic level of equal to or greater than 2.0, the patient may discontinue the Lovenox injections.    Information on my medicine - Coumadin   (Warfarin)  This medication education was reviewed with me or my healthcare representative as part of my discharge preparation.  The pharmacist that spoke with me during my hospital stay was:  Altha Harm, PharmD  Why was Coumadin prescribed for you? Coumadin was prescribed for you because you have a blood clot or a medical condition that can cause an increased risk of forming blood clots. Blood clots can cause serious health problems by blocking the flow of blood to the heart, lung, or brain. Coumadin can prevent harmful blood clots from forming. As a reminder your indication for Coumadin is:   Blood Clot Prevention After Orthopedic Surgery  What test will check on my response to Coumadin? While on Coumadin (warfarin) you will need to have an INR test regularly to ensure that your dose is keeping you in the desired range. The INR (international normalized ratio) number is calculated from the result of the laboratory test called prothrombin time (PT).  If an INR APPOINTMENT HAS NOT ALREADY BEEN MADE FOR YOU please schedule an appointment to have this lab work done by your health care provider within 7 days. Your INR goal is usually a number between:  2 to 3 or your provider may give you a  more narrow range like 2-2.5.  Ask your health care provider during an office visit what your goal INR is.  What  do you need to  know  About  COUMADIN? Take Coumadin (warfarin) exactly as prescribed by your healthcare provider about the same time each day.  DO NOT stop taking without talking to the doctor who prescribed the medication.  Stopping without other blood clot prevention medication to take the place of Coumadin may increase your risk of developing a new clot or stroke.  Get refills before you run out.  What do you do if you miss a dose? If you miss a dose, take it as soon as you remember on the same day then continue your regularly scheduled regimen the next day.  Do not take two doses of Coumadin at the same time.  Important Safety Information A possible side effect of Coumadin (Warfarin) is an increased risk of bleeding. You should call your healthcare provider right away if you experience any of the following: ? Bleeding from an injury or your nose that does not stop. ? Unusual colored urine (red or dark brown) or unusual colored stools (red or black). ? Unusual bruising for unknown reasons. ? A serious fall or if you hit your head (even if there is no bleeding).  Some foods or medicines interact with Coumadin (warfarin) and might alter your response to warfarin. To help avoid this: ? Eat a balanced diet, maintaining a consistent amount of Vitamin K. ? Notify your provider about major diet changes you plan to make. ? Avoid alcohol or limit your intake to 1 drink for women and 2 drinks for men per day. (1 drink is 5 oz. wine, 12 oz. beer, or 1.5 oz. liquor.)  Make sure that ANY health care provider who prescribes medication for you knows that you are taking Coumadin (  warfarin).  Also make sure the healthcare provider who is monitoring your Coumadin knows when you have started a new medication including herbals and non-prescription products.  Coumadin (Warfarin)  Major Drug  Interactions  Increased Warfarin Effect Decreased Warfarin Effect  Alcohol (large quantities) Antibiotics (esp. Septra/Bactrim, Flagyl, Cipro) Amiodarone (Cordarone) Aspirin (ASA) Cimetidine (Tagamet) Megestrol (Megace) NSAIDs (ibuprofen, naproxen, etc.) Piroxicam (Feldene) Propafenone (Rythmol SR) Propranolol (Inderal) Isoniazid (INH) Posaconazole (Noxafil) Barbiturates (Phenobarbital) Carbamazepine (Tegretol) Chlordiazepoxide (Librium) Cholestyramine (Questran) Griseofulvin Oral Contraceptives Rifampin Sucralfate (Carafate) Vitamin K   Coumadin (Warfarin) Major Herbal Interactions  Increased Warfarin Effect Decreased Warfarin Effect  Garlic Ginseng Ginkgo biloba Coenzyme Q10 Green tea St. Johns wort    Coumadin (Warfarin) FOOD Interactions  Eat a consistent number of servings per week of foods HIGH in Vitamin K (1 serving =  cup)  Collards (cooked, or boiled & drained) Kale (cooked, or boiled & drained) Mustard greens (cooked, or boiled & drained) Parsley *serving size only =  cup Spinach (cooked, or boiled & drained) Swiss chard (cooked, or boiled & drained) Turnip greens (cooked, or boiled & drained)  Eat a consistent number of servings per week of foods MEDIUM-HIGH in Vitamin K (1 serving = 1 cup)  Asparagus (cooked, or boiled & drained) Broccoli (cooked, boiled & drained, or raw & chopped) Brussel sprouts (cooked, or boiled & drained) *serving size only =  cup Lettuce, raw (green leaf, endive, romaine) Spinach, raw Turnip greens, raw & chopped   These websites have more information on Coumadin (warfarin):  FailFactory.se; VeganReport.com.au;

## 2015-03-11 NOTE — Progress Notes (Signed)
Subjective: 2 Days Post-Op Procedure(s) (LRB): TOTAL KNEE BILATERAL (Bilateral) Patient reports pain as moderate.   Patient seen in rounds for Dr. Wynelle Link. Epidural came out last night.  Encourage PO meds and will change to muscle relaxant also. Will add Lovenox today and also DC the foley.  Working on [employer] and if insurance comes thru and his pain under better control, he may be able to go to [facility]. Patient is well, but has had some minor complaints of pain in the both knees, requiring pain medications Plan is to go Rehab after hospital stay.  Objective: Vital signs in last 24 hours: Temp:  [98 F (36.7 C)-98.5 F (36.9 C)] 98 F (36.7 C) (10/21 0626) Pulse Rate:  [93-97] 94 (10/21 0626) Resp:  [16-22] 16 (10/21 0626) BP: (148-166)/(87-88) 166/87 mmHg (10/21 0626) SpO2:  [92 %-100 %] 92 % (10/21 0626)  Intake/Output from previous day:  Intake/Output Summary (Last 24 hours) at 03/11/15 1007 Last data filed at 03/11/15 0845  Gross per 24 hour  Intake 2004.5 ml  Output   1575 ml  Net  429.5 ml    Intake/Output this shift: Total I/O In: 240 [P.O.:240] Out: -   Labs:  Recent Labs  03/10/15 0525 03/11/15 0515  HGB 12.3* 11.1*    Recent Labs  03/10/15 0525 03/11/15 0515  WBC 10.4 11.8*  RBC 3.98* 3.61*  HCT 36.7* 32.9*  PLT 164 182    Recent Labs  03/10/15 0525 03/11/15 0515  NA 141 129*  K 3.7 3.6  CL 111 96*  CO2 24 24  BUN 14 12  CREATININE 0.88 0.76  GLUCOSE 118* 129*  CALCIUM 8.6* 8.2*    Recent Labs  03/10/15 0525 03/11/15 0515  INR 1.20 1.19    EXAM General - Patient is Alert, Appropriate and Oriented Extremity - Neurovascular intact Sensation intact distally Dorsiflexion/Plantar flexion intact No cellulitis present Dressing/Incision - clean, dry, no drainage, healing to both knees Motor Function - intact, moving foot and toes well on exam.   Past Medical History  Diagnosis Date  . MVP (mitral valve prolapse)   . MR  (mitral regurgitation)     severe; mitral valve repair in march 2009  . Atrial flutter ([place])     s/p ablation  . Hyperlipidemia   . CAD (coronary artery disease)     cath 06/2007 40% LM  . CVD (cerebrovascular disease)     59% right ICA, 49% left ICA; h/oTIA; fu study 8/11 with normal carotids  . History of colonoscopy   . Osteoarthritis     both knees  . Dysrhythmia   . Heart murmur   . Shortness of breath dyspnea     increased exertion   . History of bronchitis   . Gait abnormality   . Horseshoe kidney     Assessment/Plan: 2 Days Post-Op Procedure(s) (LRB): TOTAL KNEE BILATERAL (Bilateral) Principal Problem:   OA (osteoarthritis) of knee  Estimated body mass index is 31.29 kg/(m^2) as calculated from the following:   Height as of this encounter: 5' 9"  (1.753 m).   Weight as of this encounter: 96.163 kg (212 lb). Up with therapy Plan for possible discharge tomorrow to [facility]  DVT Prophylaxis - Lovenox and Coumadin Weight-Bearing as tolerated to both legs   Take Coumadin for four weeks and then discontinue. The dose may need to be adjusted based upon the INR. Please follow the INR and titrate Coumadin dose for a therapeutic range between 2.0 and 3.0 INR.  After completing the four weeks of Coumadin, the patient may stop the Coumadin and resume their 81 mg Aspirin daily.  Lovenox injections will start tomorrow evening after the epidural has been removed and continue until the INR is therapeutic at or greater than 2.0. When INR reaches the therapeutic level of equal to or greater than 2.0, the patient may discontinue the Lovenox injections.  Arlee Muslim, PA-C Orthopaedic Surgery 03/11/2015, 10:07 AM

## 2015-03-11 NOTE — Progress Notes (Signed)
Rehab admissions - I met with patient and his wife this morning.  They are hopeful that insurance will approve inpatient rehab at [facility].  I am awaiting call back from [facility] about inpatient rehab.  Call me for questions.  #[phone]

## 2015-03-11 NOTE — H&P (Signed)
Physical Medicine and Rehabilitation Admission H&P    Chief Complaint:knee pain  HPI: [name] is a 64 y.o.right handed male with history of MVP, mitral regurgitation, atrial flutter status post ablation maintained on aspirin therapy. Patient lives with his wife independently with a cane and sometimes a walker prior to admission. 2 level home with bedroom and full bathroom upstairs. 3 steps to entry of home. Wife can assist as needed. 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 No relief with conservative care including NSAIDs and/or analgesics, corticosteroid injections ,activity modification. Underwent bilateral total knee arthroplasty 03/09/2015 per Dr. Maureen Ralphs. Hospital course pain management. Weightbearing as tolerated bilateral lower extremities. Coumadin for DVT prophylaxis. Acute blood loss anemia 8.3 and monitored. Physical and occupational therapy evaluations completed. M.D. has requested physical medicine rehabilitation consult. Patient was admitted for comprehensive rehabilitation program  ROS Constitutional: Negative for fever and chills.  HENT: Negative for hearing loss.  Eyes: Negative for blurred vision and double vision.   Presbyopia  Respiratory: Negative for cough and shortness of breath.   DOE  Cardiovascular: Negative for chest pain and leg swelling.  Gastrointestinal: Positive for constipation. Negative for nausea, vomiting and abdominal pain.  Genitourinary: Negative for dysuria and hematuria.  Musculoskeletal: Positive for joint pain.  Skin: Negative for rash.  Neurological: Negative for seizures, loss of consciousness and headaches.  All other systems reviewed and are negative   Past Medical History  Diagnosis Date  . MVP (mitral valve prolapse)   . MR (mitral regurgitation)     severe; mitral valve repair in march 2009  . Atrial flutter ([place])     s/p ablation  . Hyperlipidemia   . CAD  (coronary artery disease)     cath 06/2007 40% LM  . CVD (cerebrovascular disease)     59% right ICA, 49% left ICA; h/oTIA; fu study 8/11 with normal carotids  . History of colonoscopy   . Osteoarthritis     both knees  . Dysrhythmia   . Heart murmur   . Shortness of breath dyspnea     increased exertion   . History of bronchitis   . Gait abnormality   . Horseshoe kidney    Past Surgical History  Procedure Laterality Date  . Tonsillectomy  1960  . Mitral valve repair  06/2007  . Heart ablation    . Cardiac catheterization    . Back surgery      secondary to ruptured disc  . Total knee arthroplasty Bilateral 03/09/2015    Procedure: TOTAL KNEE BILATERAL;  Surgeon: Gaynelle Arabian, MD;  Location: [facility];  Service: Orthopedics;  Laterality: Bilateral;   Family History  Problem Relation Age of Onset  . Diabetes    . Liver disease Father     cancer  . Hyperlipidemia Father   . Liver cancer Father   . Colon cancer Neg Hx   . Esophageal cancer Neg Hx   . Rectal cancer Neg Hx   . Stomach cancer Neg Hx   . Colitis Daughter   . Crohn's disease Daughter    Social History:  reports that he has never smoked. He has never used smokeless tobacco. He reports that he drinks alcohol. He reports that he does not use illicit drugs. Allergies:  Allergies  Allergen Reactions  . Statins Other (See Comments)    Messes with liver function  . Codeine     REACTION: nausea  . Tape Rash   Medications Prior to  Admission  Medication Sig Dispense Refill  . aspirin EC 81 MG tablet Take 81 mg by mouth daily.      . meloxicam (MOBIC) 15 MG tablet Take 15 mg by mouth daily.    Marland Kitchen omega-3 acid ethyl esters (LOVAZA) 1 G capsule TAKE 2 CAPSULES BY MOUTH TWICE A DAY 120 capsule 5  . Saw Palmetto, Serenoa repens, (SAW PALMETTO PO) Take 2 tablets by mouth daily.     Marland Kitchen amoxicillin (AMOXIL) 500 MG capsule Take 500 mg by mouth as directed. FOR DENTAL WORK ONLY      Home: Home Living Family/patient expects  to be discharged to:: Inpatient rehab Living Arrangements: Spouse/significant other Available Help at Discharge: Family, Available 24 hours/day Type of Home: House Home Access: Stairs to enter [employer] of Steps: 3 Home Layout: Two level, 1/2 bath on main level, Bed/bath upstairs Alternate Level Stairs-Number of Steps: Flight  Lives With: Spouse, Son, Daughter   Functional History: Prior Function Level of Independence: Independent with assistive device(s) Comments: Pt states using RW prior to admit  Functional Status:  Mobility: Bed Mobility Overal bed mobility: Needs Assistance Bed Mobility: Supine to Sit Supine to sit: Min guard Sit to supine: Mod assist General bed mobility comments: assist the L leg onto the bed, used leg lifter on the right Transfers Overall transfer level: Needs assistance Equipment used: Rolling walker (2 wheeled) Transfers: Sit to/from Stand Sit to Stand: Mod assist, From elevated surface, +2 safety/equipment General transfer comment: did not useeither KI, extra time to power up to stand from the recliner.patient able to flex the Right knee to about 80 degrees to stand,max use of the UE's and walks legs back.Able to reach up 1 hand at a time  for RW. pivot steps from recliner to bed after ther exercises in recliner. Ambulation/Gait Ambulation/Gait assistance: Min assist, +2 safety/equipment Ambulation Distance (Feet): 115 Feet Assistive device: Rolling walker (2 wheeled) Gait Pattern/deviations: Step-to pattern, Step-through pattern General Gait Details: no KI used, able to sequence and roll the RW as he steps forward. Cues for posture and position inside of RW for safety.  Gait velocity: decr Gait velocity interpretation: Below normal speed for age/gender    ADL: ADL Overall ADL's : Needs assistance/impaired Eating/Feeding: Independent, Sitting Grooming: Wash/dry face, Set up, Bed level Upper Body Bathing: Minimal assitance, Bed  level Lower Body Bathing: +2 for physical assistance, +2 for safety/equipment, Maximal assistance, Sit to/from stand Upper Body Dressing : Minimal assistance, Sitting Lower Body Dressing: +2 for physical assistance, Total assistance, Sit to/from stand, +2 for safety/equipment Toilet Transfer: +2 for physical assistance, +2 for safety/equipment, Moderate assistance, Ambulation, RW Toileting- Clothing Manipulation and Hygiene: +2 for safety/equipment, +2 for physical assistance, Total assistance, Sit to/from stand General ADL Comments: Pt unable to let go of walker currently to use UEs in functional ADL to wash periareas or pull up clothing. He is very motivated and wants to do what he can. Educated pt and spouse on AE options for LB self care and he is interested in Geologist, engineering.   Cognition: Cognition Overall Cognitive Status: Within Functional Limits for tasks assessed Orientation Level: Oriented X4 Cognition Arousal/Alertness: Awake/alert Behavior During Therapy: WFL for tasks assessed/performed Overall Cognitive Status: Within Functional Limits for tasks assessed  Physical Exam: Blood pressure 148/58, pulse 88, temperature 99.8 F (37.7 C), temperature source Oral, resp. rate 18, height 5' 9"  (1.753 m), weight 96.163 kg (212 lb), SpO2 98 %. Physical Exam Constitutional: He is oriented to person, place, and time.  He appears well-developed and well-nourished.  HENT:  Head: Normocephalic and atraumatic.  Eyes: Conjunctivae and EOM are normal.  Neck: Normal range of motion. Neck supple. No thyromegaly present.  Cardiovascular: Regular rhythm.  No murmur heard. Tachycardic  Respiratory: Effort normal and breath sounds normal. No respiratory distress.  GI: Soft. Bowel sounds are normal. He exhibits no distension.  Musculoskeletal: He exhibits edema and tenderness.  Strength b/l UE 5/5 grossly B/l hip flexion 2+/5 hip flex (inhibited by pain), 5/5 ankle dorsi/plantar flexion grossly   Neurological: He is alert and oriented to person, place, and time.  Skin: Skin is warm and dry.  Bilateral knee incisions are dressed appropriately tender  Psychiatric: He has a normal mood and affect. His behavior is normal   Results for orders placed or performed during the hospital encounter of 03/09/15 (from the past 48 hour(s))  CBC     Status: Abnormal   Collection Time: 03/13/15  4:59 AM  Result Value Ref Range   WBC 9.6 4.0 - 10.5 K/uL   RBC 2.88 (L) 4.22 - 5.81 MIL/uL   Hemoglobin 8.8 (L) 13.0 - 17.0 g/dL   HCT 25.6 (L) 39.0 - 52.0 %   MCV 88.9 78.0 - 100.0 fL   MCH 30.6 26.0 - 34.0 pg   MCHC 34.4 30.0 - 36.0 g/dL   RDW 12.6 11.5 - 15.5 %   Platelets 152 150 - 400 K/uL  Protime-INR     Status: Abnormal   Collection Time: 03/13/15  4:59 AM  Result Value Ref Range   Prothrombin Time 19.8 (H) 11.6 - 15.2 seconds   INR 1.68 (H) 0.00 - 1.49  CBC     Status: Abnormal   Collection Time: 03/14/15  5:21 AM  Result Value Ref Range   WBC 7.7 4.0 - 10.5 K/uL   RBC 2.74 (L) 4.22 - 5.81 MIL/uL   Hemoglobin 8.3 (L) 13.0 - 17.0 g/dL   HCT 24.8 (L) 39.0 - 52.0 %   MCV 90.5 78.0 - 100.0 fL   MCH 30.3 26.0 - 34.0 pg   MCHC 33.5 30.0 - 36.0 g/dL   RDW 12.8 11.5 - 15.5 %   Platelets 189 150 - 400 K/uL  Protime-INR     Status: Abnormal   Collection Time: 03/14/15  7:15 AM  Result Value Ref Range   Prothrombin Time 21.0 (H) 11.6 - 15.2 seconds   INR 1.81 (H) 0.00 - 1.49   No results found.     Medical Problem List and Plan: 1. 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  DVT Prophylaxis/Anticoagulation: Coumadin therapy. Monitor for any bleeding episodes. Check vascular study 3. Pain Management: Robaxin and Ultram as needed. Monitor with increased mobility 4. Constipation. Laxative assistance 5. Neuropsych: This patient is capable of making decisions on his own behalf. 6. Skin/Wound Care: Routine skin checks 7.  Fluids/Electrolytes/Nutrition: Routine I&O . Follow-up chemistries 8.Mitral valve prolapse/ atrial flutter status post ablation. Resume aspirin after Coumadin completed 9. Hyperlipidemia.Lovaza 10.ABLA. Continue iron supplement. Follow-up CBC 11. Constipation.Laxative assist  Post Admission Physician Evaluation: 1. Functional deficits secondary  to bilateral total knee arthroplasty secondary to end-stage osteoarthritis. 2. Patient is admitted to receive collaborative, interdisciplinary care between the physiatrist, rehab nursing staff, and therapy team. 3. Patient's level of medical complexity and substantial therapy needs in context of that medical necessity cannot be provided at a lesser intensity of care such as a SNF. 4. Patient has experienced substantial functional loss from  his/her baseline which was documented above under the "Functional History" and "Functional Status" headings.  Judging by the patient's diagnosis, physical exam, and functional history, the patient has potential for functional progress which will result in measurable gains while on inpatient rehab.  These gains will be of substantial and practical use upon discharge  in facilitating mobility and self-care at the household level. 5. Physiatrist will provide 24 hour management of medical needs as well as oversight of the therapy plan/treatment and provide guidance as appropriate regarding the interaction of the two. 6. 24 hour rehab nursing will assist with bowel management, safety, skin/wound care, disease management, medication administration, pain management and patient education and help integrate therapy concepts, techniques,education, etc. 7. PT will assess and treat for/with: Lower extremity strength, range of motion, stamina, balance, functional mobility, safety, adaptive techniques and equipment, woundcare, coping skills, pain control, joint replacement education.   Goals are: Mod I/Supervision. 8. OT will assess and  treat for/with: ADL's, functional mobility, safety, upper extremity strength, adaptive techniques and equipment, wound mgt, ego support, and community reintegration.   Goals are: Mod I/Supervision. Therapy may not proceed with showering this patient. 9. Case Management and Social Worker will assess and treat for psychological issues and discharge planning. 10. Team conference will be held weekly to assess progress toward goals and to determine barriers to discharge. 11. Patient will receive at least 3 hours of therapy per day at least 5 days per week. 12. ELOS: 10-12 days.       13. Prognosis:  excellent    Delice Lesch, MD  03/14/2015

## 2015-03-11 NOTE — Progress Notes (Signed)
Physical Therapy Treatment Patient Details Name: [name] MRN: [id number] DOB: 08-12-1950 Today's Date: 03/11/2015    History of Present Illness Pt admitted for bilateral TKA;     PT Comments    Pt motivated and progressing with mobility - continues pain ltd.  Follow Up Recommendations  CIR     Equipment Recommendations  None recommended by PT    Recommendations for Other Services OT consult     Precautions / Restrictions Precautions Precautions: Knee Required Braces or Orthoses: Knee Immobilizer - Right;Knee Immobilizer - Left Knee Immobilizer - Right: Discontinue once straight leg raise with < 10 degree lag Knee Immobilizer - Left: Discontinue once straight leg raise with < 10 degree lag Restrictions Weight Bearing Restrictions: No Other Position/Activity Restrictions: WBAT    Mobility  Bed Mobility Overal bed mobility: Needs Assistance Bed Mobility: Supine to Sit     Supine to sit: Mod assist;+2 for physical assistance;+2 for safety/equipment     General bed mobility comments: Assist bil LEs and to control trunk  Transfers Overall transfer level: Needs assistance Equipment used: Rolling walker (2 wheeled) Transfers: Sit to/from Stand Sit to Stand: +2 physical assistance;+2 safety/equipment;From elevated surface         General transfer comment:  assist to rise and steady. verbal cues for technique.  Ambulation/Gait Ambulation/Gait assistance: Min assist;Mod assist;+2 physical assistance;+2 safety/equipment Ambulation Distance (Feet): 18 Feet Assistive device: Rolling walker (2 wheeled) Gait Pattern/deviations: Step-to pattern;Decreased step length - right;Decreased step length - left;Shuffle;Trunk flexed Gait velocity: decr   General Gait Details: cues for sequence, posture and position from [employer]            Wheelchair Mobility    Modified Rankin (Stroke Patients Only)       Balance                                    Cognition Arousal/Alertness: Awake/alert Behavior During Therapy: WFL for tasks assessed/performed Overall Cognitive Status: Within Functional Limits for tasks assessed                      Exercises Total Joint Exercises Ankle Circles/Pumps: AROM;Both;15 reps;Supine Quad Sets: AROM;Both;10 reps;Supine Heel Slides: AAROM;10 reps;Supine;Both Straight Leg Raises: AAROM;Both;5 reps;Supine    General Comments        Pertinent Vitals/Pain Pain Assessment: 0-10 Pain Score: 6  Pain Location: Bil knees Pain Descriptors / Indicators: Aching;Sore Pain Intervention(s): Limited activity within patient's tolerance;Monitored during session;Premedicated before session;Ice applied    Home Living     Available Help at Discharge: Family;Available 24 hours/day Type of Home: House Home Access: Stairs to enter   Home Layout: Two level;1/2 bath on main level;Bed/bath upstairs        Prior Function            PT Goals (current goals can now be found in the care plan section) Acute Rehab PT Goals Patient Stated Goal: go to rehab and return to independence. PT Goal Formulation: With patient Potential to Achieve Goals: Good Progress towards PT goals: Progressing toward goals    Frequency  7X/week    PT Plan Current plan remains appropriate    Co-evaluation             End of Session Equipment Utilized During Treatment: Gait belt;Right knee immobilizer;Left knee immobilizer Activity Tolerance: Patient tolerated treatment well;Patient limited by pain Patient left: in chair;with  call bell/phone within reach;with family/visitor present     Time: 8367-2550 PT Time Calculation (min) (ACUTE ONLY): 32 min  Charges:  $Gait Training: 8-22 mins $Therapeutic Exercise: 8-22 mins                    G Codes:      [name] 01-Apr-2015, 12:40 PM

## 2015-03-11 NOTE — Progress Notes (Signed)
Physical Therapy Treatment Patient Details Name: MARQUON ALCALA MRN: [id number] DOB: 30-Jan-1951 Today's Date: 03/11/2015    History of Present Illness Pt admitted for bilateral TKA;     PT Comments    Pt motivated and progressing with mobility with better pain control this pm.  Follow Up Recommendations  CIR     Equipment Recommendations  None recommended by PT    Recommendations for Other Services OT consult     Precautions / Restrictions Precautions Precautions: Knee Required Braces or Orthoses: Knee Immobilizer - Right;Knee Immobilizer - Left Knee Immobilizer - Right: Discontinue once straight leg raise with < 10 degree lag Knee Immobilizer - Left: Discontinue once straight leg raise with < 10 degree lag Restrictions Weight Bearing Restrictions: No Other Position/Activity Restrictions: WBAT    Mobility  Bed Mobility Overal bed mobility: Needs Assistance Bed Mobility: Sit to Supine       Sit to supine: Mod assist;+2 for physical assistance   General bed mobility comments: Assist bil LEs and to control trunk  Transfers Overall transfer level: Needs assistance Equipment used: Rolling walker (2 wheeled) Transfers: Sit to/from Stand Sit to Stand: +2 physical assistance;+2 safety/equipment;Mod assist         General transfer comment:  assist to rise and steady. verbal cues for technique.  Ambulation/Gait Ambulation/Gait assistance: Min assist;Mod assist;+2 safety/equipment Ambulation Distance (Feet): 25 Feet Assistive device: Rolling walker (2 wheeled) Gait Pattern/deviations: Step-to pattern;Decreased step length - right;Decreased step length - left;Shuffle;Trunk flexed Gait velocity: decr   General Gait Details: cues for sequence, posture and position from [employer]            Wheelchair Mobility    Modified Rankin (Stroke Patients Only)       Balance                                    Cognition Arousal/Alertness:  Awake/alert Behavior During Therapy: WFL for tasks assessed/performed Overall Cognitive Status: Within Functional Limits for tasks assessed                      Exercises Total Joint Exercises Ankle Circles/Pumps: AROM;Both;15 reps;Supine Quad Sets: AROM;Both;10 reps;Supine Heel Slides: AAROM;10 reps;Supine;Both Straight Leg Raises: AAROM;Both;Supine;10 reps    General Comments        Pertinent Vitals/Pain Pain Assessment: 0-10 Pain Score: 4  Pain Location: Bil knees Pain Descriptors / Indicators: Aching;Sore Pain Intervention(s): Limited activity within patient's tolerance;Monitored during session;Premedicated before session;Ice applied    Home Living     Available Help at Discharge: Family;Available 24 hours/day Type of Home: House Home Access: Stairs to enter   Home Layout: Two level;1/2 bath on main level;Bed/bath upstairs        Prior Function            PT Goals (current goals can now be found in the care plan section) Acute Rehab PT Goals Patient Stated Goal: go to rehab and return to independence. PT Goal Formulation: With patient Potential to Achieve Goals: Good Progress towards PT goals: Progressing toward goals    Frequency  7X/week    PT Plan Current plan remains appropriate    Co-evaluation             End of Session Equipment Utilized During Treatment: Gait belt;Right knee immobilizer;Left knee immobilizer Activity Tolerance: Patient tolerated treatment well;Patient limited by pain Patient left: in bed;with call bell/phone  within reach     Time: 1320-1358 PT Time Calculation (min) (ACUTE ONLY): 38 min  Charges:  $Gait Training: 8-22 mins $Therapeutic Exercise: 8-22 mins $Therapeutic Activity: 8-22 mins                    G Codes:      Chrisangel Eskenazi 27-Mar-2015, 2:06 PM

## 2015-03-12 LAB — PROTIME-INR
INR: 1.45 (ref 0.00–1.49)
PROTHROMBIN TIME: 17.7 s — AB (ref 11.6–15.2)

## 2015-03-12 LAB — CBC
HCT: 30 % — ABNORMAL LOW (ref 39.0–52.0)
Hemoglobin: 10.2 g/dL — ABNORMAL LOW (ref 13.0–17.0)
MCH: 30.8 pg (ref 26.0–34.0)
MCHC: 34 g/dL (ref 30.0–36.0)
MCV: 90.6 fL (ref 78.0–100.0)
PLATELETS: 183 10*3/uL (ref 150–400)
RBC: 3.31 MIL/uL — ABNORMAL LOW (ref 4.22–5.81)
RDW: 12.7 % (ref 11.5–15.5)
WBC: 11.5 10*3/uL — ABNORMAL HIGH (ref 4.0–10.5)

## 2015-03-12 LAB — BASIC METABOLIC PANEL
Anion gap: 8 (ref 5–15)
BUN: 12 mg/dL (ref 6–20)
CALCIUM: 8.2 mg/dL — AB (ref 8.9–10.3)
CO2: 24 mmol/L (ref 22–32)
CREATININE: 0.89 mg/dL (ref 0.61–1.24)
Chloride: 96 mmol/L — ABNORMAL LOW (ref 101–111)
GFR calc non Af Amer: >60 mL/min (ref >60)
Glucose, Bld: 125 mg/dL — ABNORMAL HIGH (ref 65–99)
Potassium: 4.1 mmol/L (ref 3.5–5.1)
SODIUM: 128 mmol/L — AB (ref 135–145)

## 2015-03-12 MED ORDER — WARFARIN SODIUM 7.5 MG PO TABS
7.5000 mg | ORAL_TABLET | Freq: Once | ORAL | Status: AC
  Administered 2015-03-12: 7.5 mg via ORAL
  Filled 2015-03-12: qty 1

## 2015-03-12 NOTE — Progress Notes (Signed)
Physical Therapy Treatment Patient Details Name: [name] MRN: [id number] DOB: Mar 11, 1951 Today's Date: 03/12/2015    History of Present Illness Pt admitted for bilateral TKA;     PT Comments    Pt continues motivated but ltd by fatigue and bil knee as well as L foot pain increased with WB.  Follow Up Recommendations  CIR     Equipment Recommendations  None recommended by PT    Recommendations for Other Services OT consult     Precautions / Restrictions Precautions Precautions: Knee Required Braces or Orthoses: Knee Immobilizer - Right;Knee Immobilizer - Left Knee Immobilizer - Right: Discontinue once straight leg raise with < 10 degree lag Knee Immobilizer - Left: Discontinue once straight leg raise with < 10 degree lag Restrictions Weight Bearing Restrictions: No Other Position/Activity Restrictions: WBAT    Mobility  Bed Mobility Overal bed mobility: Needs Assistance Bed Mobility: Sit to Supine       Sit to supine: Mod assist   General bed mobility comments: Assist with Bil LEs  Transfers Overall transfer level: Needs assistance Equipment used: Rolling walker (2 wheeled) Transfers: Sit to/from Stand Sit to Stand: +2 physical assistance;+2 safety/equipment;Mod assist         General transfer comment:  assist to rise and steady. verbal cues for technique.  Ambulation/Gait Ambulation/Gait assistance: Min assist;Mod assist;+2 physical assistance;+2 safety/equipment Ambulation Distance (Feet): 17 Feet Assistive device: Rolling walker (2 wheeled) Gait Pattern/deviations: Step-to pattern;Decreased step length - right;Decreased step length - left;Shuffle;Trunk flexed Gait velocity: decr   General Gait Details: cues for sequence, posture and position from RW. Pt with increased difficulty WB on L LE 2* foot pain   Stairs            Wheelchair Mobility    Modified Rankin (Stroke Patients Only)       Balance                                     Cognition Arousal/Alertness: Awake/alert Behavior During Therapy: WFL for tasks assessed/performed Overall Cognitive Status: Within Functional Limits for tasks assessed                      Exercises      General Comments        Pertinent Vitals/Pain Pain Assessment: 0-10 Pain Score: 4  Pain Location: Bil knees and L foot Pain Descriptors / Indicators: Aching;Sore Pain Intervention(s): Limited activity within patient's tolerance;Monitored during session;Premedicated before session;Ice applied    Home Living                      Prior Function            PT Goals (current goals can now be found in the care plan section) Acute Rehab PT Goals Patient Stated Goal: go to rehab and return to independence. PT Goal Formulation: With patient Potential to Achieve Goals: Good Progress towards PT goals: Progressing toward goals    Frequency  7X/week    PT Plan Current plan remains appropriate    Co-evaluation             End of Session Equipment Utilized During Treatment: Gait belt;Right knee immobilizer;Left knee immobilizer Activity Tolerance: Patient limited by pain;Patient limited by fatigue Patient left: in bed;with call bell/phone within reach;with family/visitor present     Time: 5465-6812 PT Time Calculation (min) (ACUTE ONLY): 23 min  Charges:  $Gait Training: 23-37 mins                    G Codes:      [name] 04/07/15, 3:46 PM

## 2015-03-12 NOTE — Progress Notes (Signed)
Physical Therapy Treatment Patient Details Name: [name] MRN: [id number] DOB: 03/21/1951 Today's Date: 03/12/2015    History of Present Illness Pt admitted for bilateral TKA;     PT Comments    Pt continues motivated but ltd this am by new onset L foot pain.  RN aware and pt reports PA dx plantar fascitis.  Follow Up Recommendations  CIR     Equipment Recommendations  None recommended by PT    Recommendations for Other Services OT consult     Precautions / Restrictions Precautions Precautions: Knee Required Braces or Orthoses: Knee Immobilizer - Right;Knee Immobilizer - Left Knee Immobilizer - Right: Discontinue once straight leg raise with < 10 degree lag Knee Immobilizer - Left: Discontinue once straight leg raise with < 10 degree lag Restrictions Weight Bearing Restrictions: No Other Position/Activity Restrictions: WBAT    Mobility  Bed Mobility Overal bed mobility: Needs Assistance Bed Mobility: Supine to Sit     Supine to sit: Mod assist     General bed mobility comments: Assist with Bil LEs  Transfers Overall transfer level: Needs assistance Equipment used: Rolling walker (2 wheeled) Transfers: Sit to/from Stand Sit to Stand: +2 physical assistance;+2 safety/equipment;Mod assist         General transfer comment:  assist to rise and steady. verbal cues for technique.  Ambulation/Gait Ambulation/Gait assistance: Min assist;Mod assist;+2 physical assistance;+2 safety/equipment Ambulation Distance (Feet): 15 Feet Assistive device: Rolling walker (2 wheeled) Gait Pattern/deviations: Step-to pattern;Decreased step length - right;Decreased step length - left;Shuffle;Trunk flexed Gait velocity: decr Gait velocity interpretation: Below normal speed for age/gender General Gait Details: cues for sequence, posture and position from RW. Pt with increased difficulty WB on L LE 2* foot pain   Stairs            Wheelchair Mobility    Modified  Rankin (Stroke Patients Only)       Balance                                    Cognition Arousal/Alertness: Awake/alert Behavior During Therapy: WFL for tasks assessed/performed Overall Cognitive Status: Within Functional Limits for tasks assessed                      Exercises Total Joint Exercises Ankle Circles/Pumps: AROM;Both;15 reps;Supine Quad Sets: AROM;Both;Supine;15 reps Heel Slides: AAROM;Supine;Both;15 reps Straight Leg Raises: AAROM;Both;Supine;15 reps Goniometric ROM: L knee -10 - 40; R knee -10 - 45    General Comments        Pertinent Vitals/Pain Pain Assessment: 0-10 Pain Score: 4  Pain Location: L foot with Bil knees 2/10 after walking Pain Descriptors / Indicators: Aching;Sore Pain Intervention(s): Limited activity within patient's tolerance;Monitored during session;Premedicated before session;Ice applied    Home Living                      Prior Function            PT Goals (current goals can now be found in the care plan section) Acute Rehab PT Goals Patient Stated Goal: go to rehab and return to independence. PT Goal Formulation: With patient Potential to Achieve Goals: Good Progress towards PT goals: Progressing toward goals    Frequency  7X/week    PT Plan Current plan remains appropriate    Co-evaluation             End of Session  Equipment Utilized During Treatment: Gait belt;Right knee immobilizer;Left knee immobilizer Activity Tolerance: Patient limited by pain;Patient limited by fatigue Patient left: with call bell/phone within reach;in chair;with family/visitor present     Time: 7169-6789 PT Time Calculation (min) (ACUTE ONLY): 20 min  Charges:  $Gait Training: 8-22 mins $Therapeutic Exercise: 23-37 mins                    G Codes:      Lamin Chandley Mar 21, 2015, 12:16 PM

## 2015-03-12 NOTE — Progress Notes (Signed)
ANTICOAGULATION CONSULT NOTE - Follow up [facility] for Warfarin Indication: VTE prophylaxis  Allergies  Allergen Reactions  . Statins Other (See Comments)    Messes with liver function  . Codeine     REACTION: nausea  . Tape Rash    Patient Measurements: Height: 5' 9"  (175.3 cm) Weight: 212 lb (96.163 kg) IBW/kg (Calculated) : 70.7  Vital Signs: Temp: 99.2 F (37.3 C) (10/22 1400) Temp Source: Oral (10/22 1400) BP: 127/70 mmHg (10/22 1400) Pulse Rate: 102 (10/22 1400)  Labs:  Recent Labs  03/10/15 0525 03/11/15 0515 03/12/15 0457  HGB 12.3* 11.1* 10.2*  HCT 36.7* 32.9* 30.0*  PLT 164 182 183  LABPROT 15.4* 15.3* 17.7*  INR 1.20 1.19 1.45  CREATININE 0.88 0.76 0.89    Estimated Creatinine Clearance: 99.7 mL/min (by C-G formula based on Cr of 0.89).   Assessment: 78 YOM s/p BL knee replacement 10/19 with postop epidural.  Pharmacy is consulted to dose warfarin starting 10/20 at 0900.  Epidural catheter was removed 10/20 ~1630.  Today, 03/12/2015:  INR 1.45, subtherapeutic as expected after first low-dose warfarin dose given 10/20  CBC:  Hgb 10.2 with expected post-op anemia (decreased from preop Hgb 15.6), Plt remain WNL  No bleeding or complications reported.  Drug-drug interactions:  PTA medication Saw-Palmetto may have anticoag effects and meloxicam may increase bleeding risk; recommended holding while on warfarin.  Patient education completed 03/11/15.  Noted plans for discharge to CIR or SNF.  Goal of Therapy:  INR 2-3   Plan:   Warfarin 7.5 mg PO today at 1800  Continue Lovenox 30 mg SQ q12h until INR > 2  Daily INR  Doreene Eland, PharmD, BCPS.   Pager: [phone] 03/12/2015 2:34 PM

## 2015-03-12 NOTE — Progress Notes (Signed)
Subjective: 3 Days Post-Op Procedure(s) (LRB): TOTAL KNEE BILATERAL (Bilateral)  Patient reports pain as mild to moderate. C/o pain in left arch since working with PT yesterday.  Denies SOB and calf pain. Tolerating POs well.  Denies BM, but admits to flatulence.  Reports that he slept well last night.  Denies fever, chills, N/V.  Objective:   VITALS:  Temp:  [97.8 F (36.6 C)-99.8 F (37.7 C)] 98.8 F (37.1 C) (10/22 0800) Pulse Rate:  [92-114] 102 (10/22 0800) Resp:  [18-25] 20 (10/22 0800) BP: (124-139)/(64-78) 125/64 mmHg (10/22 0800) SpO2:  [94 %-98 %] 98 % (10/22 0800)  General: WDWN patient in NAD. Psych:  Appropriate mood and affect. Neuro:  A&O x 3, Moving all extremities, sensation intact to light touch HEENT:  EOMs intact Chest:  Even non-labored respirations Skin:  Dressing C/D/I, no rashes or lesions Extremities: warm/dry, mild edema, no erythema or echymosis.  No lymphadenopathy. Pulses: Popliteus 2+ MSK:  Point tender to palpation of medial cord of left arch.  ROM: -10 degrees to TKE bilaterally, MMT: patient can perform quad set bilaterally, (-) Homan's    LABS  Recent Labs  03/10/15 0525 03/11/15 0515 03/12/15 0457  HGB 12.3* 11.1* 10.2*  WBC 10.4 11.8* 11.5*  PLT 164 182 183    Recent Labs  03/11/15 0515 03/12/15 0457  NA 129* 128*  K 3.6 4.1  CL 96* 96*  CO2 24 24  BUN 12 12  CREATININE 0.76 0.89  GLUCOSE 129* 125*    Recent Labs  03/11/15 0515 03/12/15 0457  INR 1.19 1.45     Assessment/Plan: 3 Days Post-Op Procedure(s) (LRB): TOTAL KNEE BILATERAL (Bilateral)  Up with therapy D/C IV fluids  Reassured patient that his left arch pain is likely due to plantar fascitis.  May ice for symptomatic relief. Plan for anti-coag with coumadin x 4 weeks, then transition to ASA. Plan for D/C to CIR when approved/available.  Mechele Claude, PA-C, [employer] Office:  [phone]

## 2015-03-12 NOTE — Progress Notes (Signed)
Physical Therapy Treatment Patient Details Name: [name] MRN: [id number] DOB: 10-23-1950 Today's Date: 03/14/2015    History of Present Illness Pt admitted for bilateral TKA;     PT Comments    Pt performed therex but required rest break prior to OOB activity   Follow Up Recommendations  CIR     Equipment Recommendations  None recommended by PT    Recommendations for Other Services OT consult     Precautions / Restrictions Precautions Precautions: Knee Required Braces or Orthoses: Knee Immobilizer - Right;Knee Immobilizer - Left Knee Immobilizer - Right: Discontinue once straight leg raise with < 10 degree lag Knee Immobilizer - Left: Discontinue once straight leg raise with < 10 degree lag Restrictions Weight Bearing Restrictions: No Other Position/Activity Restrictions: WBAT    Mobility  Bed Mobility                  Transfers                    Ambulation/Gait                 Stairs            Wheelchair Mobility    Modified Rankin (Stroke Patients Only)       Balance                                    Cognition Arousal/Alertness: Awake/alert Behavior During Therapy: WFL for tasks assessed/performed Overall Cognitive Status: Within Functional Limits for tasks assessed                      Exercises Total Joint Exercises Ankle Circles/Pumps: AROM;Both;15 reps;Supine Quad Sets: AROM;Both;Supine;15 reps Heel Slides: AAROM;Supine;Both;15 reps Straight Leg Raises: AAROM;Both;Supine;15 reps Goniometric ROM: L knee -10 - 40; R knee -10 - 45    General Comments        Pertinent Vitals/Pain Pain Assessment: 0-10 Pain Score: 6  Pain Location: Bil knees and 3/10 L foot Pain Descriptors / Indicators: Aching;Sore Pain Intervention(s): Limited activity within patient's tolerance;Monitored during session;Premedicated before session;Ice applied    Home Living                       Prior Function            PT Goals (current goals can now be found in the care plan section) Acute Rehab PT Goals Patient Stated Goal: go to rehab and return to independence. PT Goal Formulation: With patient Potential to Achieve Goals: Good Progress towards PT goals: Progressing toward goals    Frequency  7X/week    PT Plan Current plan remains appropriate    Co-evaluation             End of Session   Activity Tolerance: Patient limited by pain;Patient limited by fatigue Patient left: in bed;with call bell/phone within reach     Time: 1018-1050 PT Time Calculation (min) (ACUTE ONLY): 32 min  Charges:  $Therapeutic Exercise: 23-37 mins                    G Codes:      Mardi Cannady Mar 14, 2015, 12:10 PM

## 2015-03-13 LAB — CBC
HEMATOCRIT: 25.6 % — AB (ref 39.0–52.0)
HEMOGLOBIN: 8.8 g/dL — AB (ref 13.0–17.0)
MCH: 30.6 pg (ref 26.0–34.0)
MCHC: 34.4 g/dL (ref 30.0–36.0)
MCV: 88.9 fL (ref 78.0–100.0)
Platelets: 152 10*3/uL (ref 150–400)
RBC: 2.88 MIL/uL — AB (ref 4.22–5.81)
RDW: 12.6 % (ref 11.5–15.5)
WBC: 9.6 10*3/uL (ref 4.0–10.5)

## 2015-03-13 LAB — PROTIME-INR
INR: 1.68 — ABNORMAL HIGH (ref 0.00–1.49)
PROTHROMBIN TIME: 19.8 s — AB (ref 11.6–15.2)

## 2015-03-13 MED ORDER — WARFARIN SODIUM 7.5 MG PO TABS
7.5000 mg | ORAL_TABLET | Freq: Once | ORAL | Status: AC
  Administered 2015-03-13: 7.5 mg via ORAL
  Filled 2015-03-13: qty 1

## 2015-03-13 NOTE — Progress Notes (Signed)
Physical Therapy Treatment Patient Details Name: [name] MRN: [id number] DOB: 1951-02-09 Today's Date: 03/13/2015    History of Present Illness Pt admitted for bilateral TKA;     PT Comments    Patient is improving in mobility, requires 2 person assist for sit/stand and ambulate.  L foot pain continues.   Follow Up Recommendations  CIR     Equipment Recommendations  None recommended by PT    Recommendations for Other Services       Precautions / Restrictions Precautions Precautions: Knee Required Braces or Orthoses: Knee Immobilizer - Right;Knee Immobilizer - Left Knee Immobilizer - Right: Discontinue once straight leg raise with < 10 degree lag Knee Immobilizer - Left: Discontinue once straight leg raise with < 10 degree lag    Mobility  Bed Mobility Overal bed mobility: Needs Assistance Bed Mobility: Supine to Sit     Supine to sit: Mod assist     General bed mobility comments: Assist with Bil LEs  Transfers Overall transfer level: Needs assistance Equipment used: Rolling walker (2 wheeled) Transfers: Sit to/from Stand Sit to Stand: From elevated surface;+2 safety/equipment;+2 physical assistance;Mod assist         General transfer comment:  assist to rise and steady. verbal cues for technique. Assist to lurch over the feet. . cues to reach back and walk legs forward.  Ambulation/Gait Ambulation/Gait assistance: Min assist;+2 physical assistance;+2 safety/equipment Ambulation Distance (Feet): 34 Feet Assistive device: Rolling walker (2 wheeled) Gait Pattern/deviations: Step-to pattern Gait velocity: decr   General Gait Details: cues for sequence, posture and position from RW. Pt with increased difficulty WB on L LE 2* foot pain   Stairs            Wheelchair Mobility    Modified Rankin (Stroke Patients Only)       Balance Overall balance assessment: Needs assistance Sitting-balance support: Feet supported;Bilateral upper  extremity supported Sitting balance-Leahy Scale: Fair                              Cognition Arousal/Alertness: Awake/alert                          Exercises      General Comments        Pertinent Vitals/Pain Pain Score: 3  Pain Location: bil; knees and  l foot, instep Pain Descriptors / Indicators: Aching;Tightness Pain Intervention(s): Limited activity within patient's tolerance;Monitored during session;Premedicated before session;Repositioned;Ice applied    Home Living                      Prior Function            PT Goals (current goals can now be found in the care plan section) Progress towards PT goals: Progressing toward goals    Frequency  7X/week    PT Plan Current plan remains appropriate    Co-evaluation             End of Session Equipment Utilized During Treatment: Gait belt;Right knee immobilizer;Left knee immobilizer Activity Tolerance: Patient tolerated treatment well;Patient limited by fatigue Patient left: in chair;with call bell/phone within reach     Time: 2952-8413 PT Time Calculation (min) (ACUTE ONLY): 16 min  Charges:  $Gait Training: 8-22 mins                    G Codes:  Claretha Cooper 03/13/2015, 4:18 PM Tresa Endo PT [phone]

## 2015-03-13 NOTE — Progress Notes (Signed)
Pt has bruising under the right side of his left thigh. He also has redness around his knee incision site. I asked PA on call if I should give pt his scheduled Lovenox, and he stated that it was necessary.  Will continue to monitor.

## 2015-03-13 NOTE — Progress Notes (Signed)
Physical Therapy Treatment Patient Details Name: [name] MRN: [id number] DOB: May 15, 1951 Today's Date: 03/13/2015    History of Present Illness Pt admitted for bilateral TKA;     PT Comments    ROM and strength improving each knee. Progressing with mobility.  Follow Up Recommendations  CIR     Equipment Recommendations  None recommended by PT    Recommendations for Other Services       Precautions / Restrictions Precautions Precautions: Knee Required Braces or Orthoses: Knee Immobilizer - Right;Knee Immobilizer - Left Knee Immobilizer - Right: Discontinue once straight leg raise with < 10 degree lag Knee Immobilizer - Left: Discontinue once straight leg raise with < 10 degree lag    Mobility  Bed Mobility Overal bed mobility: Needs Assistance Bed Mobility: Sit to Supine     Supine to sit: Mod assist Sit to supine: Mod assist   General bed mobility comments: able to scoot hips back onto bed, assist with legs onto the bed.  Transfers Overall transfer level: Needs assistance Equipment used: Rolling walker (2 wheeled) Transfers: Sit to/from Stand Sit to Stand: Max assist;+2 physical assistance;+2 safety/equipment         General transfer comment: more effort and assist to stand from recliner, extra time to walk legs back.  Ambulation/Gait Ambulation/Gait assistance: +2 physical assistance;+2 safety/equipment;Mod assist Ambulation Distance (Feet): 30 Feet Assistive device: Rolling walker (2 wheeled) Gait Pattern/deviations: Step-to pattern;Trunk flexed Gait velocity: decr   General Gait Details: patient quite fatigued for  this session for ambulation. HR 100, sats 100%   Stairs            Wheelchair Mobility    Modified Rankin (Stroke Patients Only)       Balance Overall balance assessment: Needs assistance Sitting-balance support: Feet supported;Bilateral upper extremity supported Sitting balance-Leahy Scale: Fair                               Cognition Arousal/Alertness: Awake/alert                          Exercises Total Joint Exercises Ankle Circles/Pumps: AROM;Both;Supine;10 reps Quad Sets: AROM;Both;Supine;15 reps Heel Slides: AAROM;Supine;Both;15 reps Hip ABduction/ADduction: AAROM;10 reps;Supine Straight Leg Raises: AAROM;10 reps;Supine Goniometric ROM: L 10-45, R 10-60 knee flexion    General Comments        Pertinent Vitals/Pain Pain Score: 5  Pain Location: bil knees Pain Descriptors / Indicators: Aching;Tightness Pain Intervention(s): Patient requesting pain meds-RN notified;Ice applied;Repositioned;Monitored during session    Home Living                      Prior Function            PT Goals (current goals can now be found in the care plan section) Progress towards PT goals: Progressing toward goals    Frequency  7X/week    PT Plan Current plan remains appropriate    Co-evaluation             End of Session Equipment Utilized During Treatment: Right knee immobilizer;Left knee immobilizer Activity Tolerance: Patient tolerated treatment well Patient left: in bed;with call bell/phone within reach     Time: 1338-1420 PT Time Calculation (min) (ACUTE ONLY): 42 min  Charges:  $Gait Training: 8-22 mins $Therapeutic Exercise: 23-37 mins  G Codes:      Claretha Cooper 03/13/2015, 4:25 PM Tresa Endo PT [phone]

## 2015-03-13 NOTE — Progress Notes (Signed)
Subjective: 4 Days Post-Op Procedure(s) (LRB): TOTAL KNEE BILATERAL (Bilateral) Patient reports pain as mild and improving.  Progressing with PT. tolerating PO's well. Positive flatus. Denies SOb, CP, or calf pain. No F/C.    Objective: Vital signs in last 24 hours: Temp:  [98.3 F (36.8 C)-99.6 F (37.6 C)] 99.6 F (37.6 C) (10/23 0602) Pulse Rate:  [88-103] 103 (10/23 0602) Resp:  [18-20] 20 (10/23 0602) BP: (127-138)/(63-71) 138/63 mmHg (10/23 0602) SpO2:  [97 %-99 %] 99 % (10/23 0602)  Intake/Output from previous day: 10/22 0701 - 10/23 0700 In: 960 [P.O.:960] Out: 750 [Urine:750] Intake/Output this shift:     Recent Labs  03/11/15 0515 03/12/15 0457 03/13/15 0459  HGB 11.1* 10.2* 8.8*    Recent Labs  03/12/15 0457 03/13/15 0459  WBC 11.5* 9.6  RBC 3.31* 2.88*  HCT 30.0* 25.6*  PLT 183 152    Recent Labs  03/11/15 0515 03/12/15 0457  NA 129* 128*  K 3.6 4.1  CL 96* 96*  CO2 24 24  BUN 12 12  CREATININE 0.76 0.89  GLUCOSE 129* 125*  CALCIUM 8.2* 8.2*    Recent Labs  03/12/15 0457 03/13/15 0459  INR 1.45 1.68*    Well nourished. Alert and oriented x3. RRR, Lungs clear, BS x4. Abdomen soft and non tender. Right Calf soft and non tender. Right knee dressing C/D/I. No DVT signs. Compartment soft. No signs of infection.  Right LE grossly neurovascular intact. Ecchymosis noted to RLE.  Left Calf soft and non tender. L knee dressing C/D/I. No DVT signs. No signs of infection or compartment syndrome. LLE grossly neurovascularly intact.   Assessment/Plan: 4 Days Post-Op Procedure(s) (LRB): TOTAL KNEE BILATERAL (Bilateral)  Up with therapy Plan for anti-coag with coumadin x 4 weeks Plan for D/C to CIR when approved/available. Probably tomorrow.   Post-op anemia: Monitor VS and Labs  STILWELL, BRYSON L 03/13/2015, 8:55 AM

## 2015-03-13 NOTE — Progress Notes (Signed)
ANTICOAGULATION CONSULT NOTE - Follow up [facility] for Warfarin Indication: VTE prophylaxis  Allergies  Allergen Reactions  . Statins Other (See Comments)    Messes with liver function  . Codeine     REACTION: nausea  . Tape Rash    Patient Measurements: Height: 5' 9"  (175.3 cm) Weight: 212 lb (96.163 kg) IBW/kg (Calculated) : 70.7  Vital Signs: Temp: 99.6 F (37.6 C) (10/23 0602) Temp Source: Oral (10/23 0602) BP: 138/63 mmHg (10/23 0602) Pulse Rate: 103 (10/23 0602)  Labs:  Recent Labs  03/11/15 0515 03/12/15 0457 03/13/15 0459  HGB 11.1* 10.2* 8.8*  HCT 32.9* 30.0* 25.6*  PLT 182 183 152  LABPROT 15.3* 17.7* 19.8*  INR 1.19 1.45 1.68*  CREATININE 0.76 0.89  --     Estimated Creatinine Clearance: 99.7 mL/min (by C-G formula based on Cr of 0.89).   Assessment: 73 YOM s/p BL knee replacement 10/19 with postop epidural.  Pharmacy is consulted to dose warfarin starting 10/20 at 0900.  Epidural catheter was removed 10/20 ~1630.  Today, 03/13/2015:  INR 1.68, subtherapeutic - trending up nicelu  CBC:  Hgb 8.8 - trending down, (decreased from preop Hgb 15.6), Plt remain WNL  Per RN 10/23 - bruising noted on thigh but no bleeding noted  Drug-drug interactions:  PTA medication Saw-Palmetto may have anticoag effects and meloxicam may increase bleeding risk; recommended holding while on warfarin.  Patient education completed 03/11/15.  Noted plans for discharge to CIR or SNF.  Goal of Therapy:  INR 2-3   Plan:   Warfarin 7.5 mg PO today at 1800  CBC in am - watch Hgb closely  Continue Lovenox 30 mg SQ q12h until INR > 2  Daily INR  Doreene Eland, PharmD, BCPS.   Pager: [phone] 03/13/2015 12:26 PM

## 2015-03-14 ENCOUNTER — Inpatient Hospital Stay (HOSPITAL_COMMUNITY)
Admission: RE | Admit: 2015-03-14 | Discharge: 2015-03-23 | DRG: 092 | Disposition: A | Discharge status: Home Health | Payer: Managed Care, Other (non HMO) | Source: Intra-hospital | Attending: Physical Medicine & Rehabilitation | Admitting: Physical Medicine & Rehabilitation

## 2015-03-14 ENCOUNTER — Encounter (HOSPITAL_COMMUNITY): Payer: Self-pay | Admitting: *Deleted

## 2015-03-14 DIAGNOSIS — D62 Acute posthemorrhagic anemia: Secondary | ICD-10-CM

## 2015-03-14 DIAGNOSIS — I341 Nonrheumatic mitral (valve) prolapse: Secondary | ICD-10-CM | POA: Diagnosis present

## 2015-03-14 DIAGNOSIS — R600 Localized edema: Secondary | ICD-10-CM | POA: Diagnosis not present

## 2015-03-14 DIAGNOSIS — R531 Weakness: Secondary | ICD-10-CM | POA: Diagnosis present

## 2015-03-14 DIAGNOSIS — E871 Hypo-osmolality and hyponatremia: Secondary | ICD-10-CM | POA: Diagnosis not present

## 2015-03-14 DIAGNOSIS — Z96653 Presence of artificial knee joint, bilateral: Secondary | ICD-10-CM

## 2015-03-14 DIAGNOSIS — E785 Hyperlipidemia, unspecified: Secondary | ICD-10-CM | POA: Diagnosis present

## 2015-03-14 DIAGNOSIS — Z8673 Personal history of transient ischemic attack (TIA), and cerebral infarction without residual deficits: Secondary | ICD-10-CM

## 2015-03-14 DIAGNOSIS — K5900 Constipation, unspecified: Secondary | ICD-10-CM | POA: Diagnosis present

## 2015-03-14 DIAGNOSIS — I2510 Atherosclerotic heart disease of native coronary artery without angina pectoris: Secondary | ICD-10-CM

## 2015-03-14 DIAGNOSIS — I4892 Unspecified atrial flutter: Secondary | ICD-10-CM | POA: Diagnosis present

## 2015-03-14 DIAGNOSIS — K5903 Drug induced constipation: Secondary | ICD-10-CM | POA: Diagnosis not present

## 2015-03-14 DIAGNOSIS — M7989 Other specified soft tissue disorders: Secondary | ICD-10-CM | POA: Diagnosis present

## 2015-03-14 DIAGNOSIS — R2689 Other abnormalities of gait and mobility: Secondary | ICD-10-CM | POA: Diagnosis present

## 2015-03-14 DIAGNOSIS — Q631 Lobulated, fused and horseshoe kidney: Secondary | ICD-10-CM | POA: Diagnosis not present

## 2015-03-14 DIAGNOSIS — R609 Edema, unspecified: Secondary | ICD-10-CM | POA: Diagnosis not present

## 2015-03-14 DIAGNOSIS — R000 Tachycardia, unspecified: Secondary | ICD-10-CM | POA: Diagnosis not present

## 2015-03-14 DIAGNOSIS — Z471 Aftercare following joint replacement surgery: Secondary | ICD-10-CM | POA: Diagnosis not present

## 2015-03-14 LAB — CBC
HEMATOCRIT: 24.8 % — AB (ref 39.0–52.0)
Hemoglobin: 8.3 g/dL — ABNORMAL LOW (ref 13.0–17.0)
MCH: 30.3 pg (ref 26.0–34.0)
MCHC: 33.5 g/dL (ref 30.0–36.0)
MCV: 90.5 fL (ref 78.0–100.0)
PLATELETS: 189 10*3/uL (ref 150–400)
RBC: 2.74 MIL/uL — ABNORMAL LOW (ref 4.22–5.81)
RDW: 12.8 % (ref 11.5–15.5)
WBC: 7.7 10*3/uL (ref 4.0–10.5)

## 2015-03-14 LAB — CBC WITH DIFFERENTIAL/PLATELET
Basophils Absolute: 0 10*3/uL (ref 0.0–0.1)
Basophils Relative: 0 %
EOS PCT: 4 %
Eosinophils Absolute: 0.3 10*3/uL (ref 0.0–0.7)
HCT: 31 % — ABNORMAL LOW (ref 39.0–52.0)
Hemoglobin: 10.5 g/dL — ABNORMAL LOW (ref 13.0–17.0)
LYMPHS ABS: 1.7 10*3/uL (ref 0.7–4.0)
LYMPHS PCT: 20 %
MCH: 30.3 pg (ref 26.0–34.0)
MCHC: 33.9 g/dL (ref 30.0–36.0)
MCV: 89.3 fL (ref 78.0–100.0)
MONO ABS: 1 10*3/uL (ref 0.1–1.0)
Monocytes Relative: 11 %
Neutro Abs: 5.7 10*3/uL (ref 1.7–7.7)
Neutrophils Relative %: 65 %
PLATELETS: 210 10*3/uL (ref 150–400)
RBC: 3.47 MIL/uL — ABNORMAL LOW (ref 4.22–5.81)
RDW: 12.8 % (ref 11.5–15.5)
WBC: 8.7 10*3/uL (ref 4.0–10.5)

## 2015-03-14 LAB — COMPREHENSIVE METABOLIC PANEL
ALT: 90 U/L — ABNORMAL HIGH (ref 17–63)
AST: 93 U/L — ABNORMAL HIGH (ref 15–41)
Albumin: 2.5 g/dL — ABNORMAL LOW (ref 3.5–5.0)
Alkaline Phosphatase: 86 U/L (ref 38–126)
Anion gap: 6 (ref 5–15)
BUN: 13 mg/dL (ref 6–20)
CHLORIDE: 97 mmol/L — AB (ref 101–111)
CO2: 28 mmol/L (ref 22–32)
Calcium: 8 mg/dL — ABNORMAL LOW (ref 8.9–10.3)
Creatinine, Ser: 0.97 mg/dL (ref 0.61–1.24)
Glucose, Bld: 120 mg/dL — ABNORMAL HIGH (ref 65–99)
POTASSIUM: 4.1 mmol/L (ref 3.5–5.1)
Sodium: 131 mmol/L — ABNORMAL LOW (ref 135–145)
Total Bilirubin: 1.6 mg/dL — ABNORMAL HIGH (ref 0.3–1.2)
Total Protein: 5.6 g/dL — ABNORMAL LOW (ref 6.5–8.1)

## 2015-03-14 LAB — PROTIME-INR
INR: 1.81 — AB (ref 0.00–1.49)
PROTHROMBIN TIME: 21 s — AB (ref 11.6–15.2)

## 2015-03-14 MED ORDER — WARFARIN SODIUM 7.5 MG PO TABS
7.5000 mg | ORAL_TABLET | Freq: Once | ORAL | Status: AC
  Administered 2015-03-14: 7.5 mg via ORAL
  Filled 2015-03-14: qty 1

## 2015-03-14 MED ORDER — ACETAMINOPHEN 650 MG RE SUPP: 650.0000 mg | Freq: Four times a day (QID) | RECTAL | Status: DC | PRN

## 2015-03-14 MED ORDER — FLEET ENEMA 7-19 GM/118ML RE ENEM: 1.0000 | ENEMA | Freq: Once | RECTAL | Status: DC | PRN

## 2015-03-14 MED ORDER — HYDROMORPHONE HCL 2 MG PO TABS: 2.0000 mg | ORAL_TABLET | ORAL | Status: DC | PRN

## 2015-03-14 MED ORDER — ACETAMINOPHEN 325 MG PO TABS
650.0000 mg | ORAL_TABLET | Freq: Four times a day (QID) | ORAL | Status: DC | PRN
  Administered 2015-03-17 – 2015-03-18 (×2): 650 mg via ORAL
  Filled 2015-03-14 (×2): qty 2

## 2015-03-14 MED ORDER — ENOXAPARIN SODIUM 30 MG/0.3ML ~~LOC~~ SOLN
30.0000 mg | Freq: Two times a day (BID) | SUBCUTANEOUS | Status: DC
  Administered 2015-03-15 – 2015-03-16 (×3): 30 mg via SUBCUTANEOUS
  Filled 2015-03-14 (×3): qty 0.3

## 2015-03-14 MED ORDER — WARFARIN SODIUM 5 MG PO TABS: 7.5000 mg | ORAL_TABLET | Freq: Every day | ORAL | Status: DC

## 2015-03-14 MED ORDER — DOCUSATE SODIUM 100 MG PO CAPS: 100.0000 mg | ORAL_CAPSULE | Freq: Two times a day (BID) | ORAL | Status: DC

## 2015-03-14 MED ORDER — NALOXEGOL OXALATE 25 MG PO TABS
25.0000 mg | ORAL_TABLET | Freq: Every day | ORAL | Status: DC
  Administered 2015-03-17: 25 mg via ORAL
  Filled 2015-03-14 (×5): qty 1

## 2015-03-14 MED ORDER — DOCUSATE SODIUM 100 MG PO CAPS
100.0000 mg | ORAL_CAPSULE | Freq: Two times a day (BID) | ORAL | Status: DC
  Administered 2015-03-14 – 2015-03-23 (×16): 100 mg via ORAL
  Filled 2015-03-14 (×18): qty 1

## 2015-03-14 MED ORDER — INFLUENZA VAC SPLIT QUAD 0.5 ML IM SUSY
0.5000 mL | PREFILLED_SYRINGE | INTRAMUSCULAR | Status: AC
  Administered 2015-03-15: 0.5 mL via INTRAMUSCULAR
  Filled 2015-03-14: qty 0.5

## 2015-03-14 MED ORDER — HYDROMORPHONE HCL 2 MG PO TABS
2.0000 mg | ORAL_TABLET | ORAL | Status: DC | PRN
  Administered 2015-03-14 – 2015-03-15 (×3): 2 mg via ORAL
  Filled 2015-03-14 (×4): qty 1

## 2015-03-14 MED ORDER — WARFARIN - PHARMACIST DOSING INPATIENT: Freq: Every day | Status: DC

## 2015-03-14 MED ORDER — POLYETHYLENE GLYCOL 3350 17 G PO PACK: 17.0000 g | PACK | Freq: Every day | ORAL | Status: DC | PRN

## 2015-03-14 MED ORDER — ENOXAPARIN SODIUM 30 MG/0.3ML ~~LOC~~ SOLN: 30.0000 mg | Freq: Two times a day (BID) | SUBCUTANEOUS | Status: DC

## 2015-03-14 MED ORDER — CYCLOBENZAPRINE HCL 5 MG PO TABS: 5.0000 mg | ORAL_TABLET | Freq: Three times a day (TID) | ORAL | Status: DC | PRN

## 2015-03-14 MED ORDER — OMEGA-3-ACID ETHYL ESTERS 1 G PO CAPS
1.0000 g | ORAL_CAPSULE | Freq: Two times a day (BID) | ORAL | Status: DC
  Administered 2015-03-16: 1 g via ORAL
  Filled 2015-03-14 (×10): qty 1

## 2015-03-14 MED ORDER — ONDANSETRON HCL 4 MG PO TABS: 4.0000 mg | ORAL_TABLET | Freq: Four times a day (QID) | ORAL | Status: DC | PRN

## 2015-03-14 MED ORDER — NALOXEGOL OXALATE 25 MG PO TABS: 25.0000 mg | ORAL_TABLET | Freq: Every day | ORAL | Status: DC

## 2015-03-14 MED ORDER — SORBITOL 70 % SOLN
30.0000 mL | Freq: Every day | Status: DC | PRN
  Administered 2015-03-14: 30 mL via ORAL
  Filled 2015-03-14: qty 30

## 2015-03-14 MED ORDER — METHOCARBAMOL 500 MG PO TABS
500.0000 mg | ORAL_TABLET | Freq: Four times a day (QID) | ORAL | Status: DC | PRN
  Administered 2015-03-15 – 2015-03-23 (×23): 500 mg via ORAL
  Filled 2015-03-14 (×23): qty 1

## 2015-03-14 MED ORDER — POLYSACCHARIDE IRON COMPLEX 150 MG PO CAPS
150.0000 mg | ORAL_CAPSULE | Freq: Every day | ORAL | Status: DC
  Administered 2015-03-15 – 2015-03-23 (×9): 150 mg via ORAL
  Filled 2015-03-14 (×9): qty 1

## 2015-03-14 MED ORDER — POLYSACCHARIDE IRON COMPLEX 150 MG PO CAPS
150.0000 mg | ORAL_CAPSULE | Freq: Every day | ORAL | Status: DC
  Administered 2015-03-14: 150 mg via ORAL
  Filled 2015-03-14: qty 1

## 2015-03-14 MED ORDER — ACETAMINOPHEN 325 MG PO TABS: 650.0000 mg | ORAL_TABLET | Freq: Four times a day (QID) | ORAL | Status: DC | PRN

## 2015-03-14 MED ORDER — BISACODYL 10 MG RE SUPP: 10.0000 mg | Freq: Every day | RECTAL | Status: DC | PRN

## 2015-03-14 MED ORDER — DIPHENHYDRAMINE HCL 12.5 MG/5ML PO ELIX: 12.5000 mg | ORAL_SOLUTION | ORAL | Status: DC | PRN

## 2015-03-14 MED ORDER — TRAMADOL HCL 50 MG PO TABS: 50.0000 mg | ORAL_TABLET | Freq: Four times a day (QID) | ORAL | Status: DC | PRN

## 2015-03-14 MED ORDER — POLYSACCHARIDE IRON COMPLEX 150 MG PO CAPS: 150.0000 mg | ORAL_CAPSULE | Freq: Every day | ORAL | Status: DC

## 2015-03-14 MED ORDER — METOCLOPRAMIDE HCL 5 MG PO TABS: 5.0000 mg | ORAL_TABLET | Freq: Three times a day (TID) | ORAL | Status: DC | PRN

## 2015-03-14 MED ORDER — ONDANSETRON HCL 4 MG/2ML IJ SOLN: 4.0000 mg | Freq: Four times a day (QID) | INTRAMUSCULAR | Status: DC | PRN

## 2015-03-14 MED ORDER — TRAMADOL HCL 50 MG PO TABS
50.0000 mg | ORAL_TABLET | Freq: Four times a day (QID) | ORAL | Status: DC | PRN
  Administered 2015-03-15: 50 mg via ORAL
  Administered 2015-03-15: 100 mg via ORAL
  Administered 2015-03-15: 50 mg via ORAL
  Administered 2015-03-15 – 2015-03-23 (×28): 100 mg via ORAL
  Filled 2015-03-14 (×6): qty 2
  Filled 2015-03-14 (×2): qty 1
  Filled 2015-03-14 (×24): qty 2

## 2015-03-14 NOTE — Progress Notes (Signed)
Peter Lorie Phenix, MD Physician Addendum Physical Medicine and Rehabilitation Consult Note 03/10/2015 6:04 AM  Related encounter: Admission (Current) from 03/09/2015 in [place] Collapse All        Physical Medicine and Rehabilitation Consult Reason for Consult: Bilateral total knee arthroplasty secondary to end-stage osteoarthritis Referring Physician: Dr.Alusio    HPI: [name] is a 64 y.o.right handed male with history of MVP, mitral regurgitation, atrial flutter status post ablation maintained on aspirin therapy. Patient lives with his wife independently with a cane and sometimes a walker prior to admission. 2 level home with bedroom and full bathroom upstairs. 3 steps to entry of home. Wife can assist as needed. 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 No relief with conservative care including NSAIDs and/or analgesics, corticosteroid injections ,activity modification. Underwent bilateral total knee arthroplasty 03/09/2015 per Dr. Maureen Ralphs. Hospital course pain management. Weightbearing as tolerated bilateral lower extremities. Coumadin for DVT prophylaxis. Physical and occupational therapy evaluations pending. M.D. has requested physical medicine rehabilitation consult.  Review of Systems  Constitutional: Negative for fever and chills.  HENT: Negative for hearing loss.  Eyes: Negative for blurred vision and double vision.   Presbyopia  Respiratory: Negative for cough and shortness of breath.   DOE  Cardiovascular: Negative for chest pain and leg swelling.  Gastrointestinal: Positive for constipation. Negative for nausea, vomiting and abdominal pain.  Genitourinary: Negative for dysuria and hematuria.  Musculoskeletal: Positive for joint pain.  Skin: Negative for rash.  Neurological: Negative for seizures, loss of consciousness and headaches.  All other systems  reviewed and are negative.  Past Medical History  Diagnosis Date  . MVP (mitral valve prolapse)   . MR (mitral regurgitation)     severe; mitral valve repair in march 2009  . Atrial flutter ([place])     s/p ablation  . Hyperlipidemia   . CAD (coronary artery disease)     cath 06/2007 40% LM  . CVD (cerebrovascular disease)     59% right ICA, 49% left ICA; h/oTIA; fu study 8/11 with normal carotids  . History of colonoscopy   . Osteoarthritis     both knees  . Dysrhythmia   . Heart murmur   . Shortness of breath dyspnea     increased exertion   . History of bronchitis   . Gait abnormality   . Horseshoe kidney    Past Surgical History  Procedure Laterality Date  . Tonsillectomy  1960  . Mitral valve repair  06/2007  . Heart ablation    . Cardiac catheterization    . Back surgery      secondary to ruptured disc  . Total knee arthroplasty Bilateral 03/09/2015    Procedure: TOTAL KNEE BILATERAL; Surgeon: Gaynelle Arabian, MD; Location: [facility]S; Service: Orthopedics; Laterality: Bilateral;   Family History  Problem Relation Age of Onset  . Diabetes    . Liver disease Father     cancer  . Hyperlipidemia Father   . Liver cancer Father   . Colon cancer Neg Hx   . Esophageal cancer Neg Hx   . Rectal cancer Neg Hx   . Stomach cancer Neg Hx   . Colitis Daughter   . Crohn's disease Daughter    Social History:  reports that he has never smoked. He has never used smokeless tobacco. He reports that he drinks alcohol. He reports that he does not use illicit drugs. Allergies:  Allergies  Allergen Reactions  . Statins Other (See Comments)    Messes with liver function  . Codeine     REACTION: nausea  . Tape Rash   Medications Prior to Admission  Medication Sig Dispense Refill  . aspirin EC 81 MG tablet Take 81  mg by mouth daily.     . meloxicam (MOBIC) 15 MG tablet Take 15 mg by mouth daily.    Marland Kitchen omega-3 acid ethyl esters (LOVAZA) 1 G capsule TAKE 2 CAPSULES BY MOUTH TWICE A DAY 120 capsule 5  . Saw Palmetto, Serenoa repens, (SAW PALMETTO PO) Take 2 tablets by mouth daily.     Marland Kitchen amoxicillin (AMOXIL) 500 MG capsule Take 500 mg by mouth as directed. FOR DENTAL WORK ONLY      Home: Home Living Family/patient expects to be discharged to:: Skilled nursing facility [facility]) Living Arrangements: Spouse/significant other  Functional History:   Functional Status:  Mobility:          ADL:    Cognition: Cognition Orientation Level: Oriented X4    Blood pressure 116/67, pulse 65, temperature 97.5 F (36.4 C), temperature source Oral, resp. rate 16, height 5' 9"  (1.753 m), weight 96.163 kg (212 lb), SpO2 100 %. Physical Exam  Constitutional: He is oriented to person, place, and time. He appears well-developed and well-nourished.  HENT:  Head: Normocephalic and atraumatic.  Eyes: Conjunctivae and EOM are normal.  Neck: Normal range of motion. Neck supple. No thyromegaly present.  Cardiovascular: Regular rhythm.  No murmur heard. Tachycardic  Respiratory: Effort normal and breath sounds normal. No respiratory distress.  GI: Soft. Bowel sounds are normal. He exhibits no distension.  Musculoskeletal: He exhibits edema and tenderness.  Strength b/l UE 5/5 grossly B/l hip flexion 2-/5 hip flex (inhibited by pain), 5/5 ankle dorsi/plantar flexion grossly  Neurological: He is alert and oriented to person, place, and time.  Skin: Skin is warm and dry.  Bilateral knee incisions are dressed appropriately tender  Psychiatric: He has a normal mood and affect. His behavior is normal.     Lab Results Last 24 Hours    Results for orders placed or performed during the hospital encounter of 03/09/15 (from the past 24 hour(s))  CBC Status: Abnormal   Collection  Time: 03/10/15 5:25 AM  Result Value Ref Range   WBC 10.4 4.0 - 10.5 K/uL   RBC 3.98 (L) 4.22 - 5.81 MIL/uL   Hemoglobin 12.3 (L) 13.0 - 17.0 g/dL   HCT 36.7 (L) 39.0 - 52.0 %   MCV 92.2 78.0 - 100.0 fL   MCH 30.9 26.0 - 34.0 pg   MCHC 33.5 30.0 - 36.0 g/dL   RDW 12.7 11.5 - 15.5 %   Platelets 164 150 - 400 K/uL  Basic metabolic panel Status: Abnormal   Collection Time: 03/10/15 5:25 AM  Result Value Ref Range   Sodium 141 135 - 145 mmol/L   Potassium 3.7 3.5 - 5.1 mmol/L   Chloride 111 101 - 111 mmol/L   CO2 24 22 - 32 mmol/L   Glucose, Bld 118 (H) 65 - 99 mg/dL   BUN 14 6 - 20 mg/dL   Creatinine, Ser 0.88 0.61 - 1.24 mg/dL   Calcium 8.6 (L) 8.9 - 10.3 mg/dL   GFR calc non Af Amer >60 >60 mL/min   GFR calc Af Amer >60 >60 mL/min   Anion gap 6 5 - 15  Protime-INR Status: Abnormal   Collection Time: 03/10/15 5:25 AM  Result Value Ref Range  Prothrombin Time 15.4 (H) 11.6 - 15.2 seconds   INR 1.20 0.00 - 1.49      Imaging Results (Last 48 hours)    No results found.    Assessment/Plan: Diagnosis: Bilateral total knee arthroplasty secondary to end-stage osteoarthritis. Labs and images independently reviewed. Old records reviewed and summated above. Answered various questions from pt and wife regarding disposition, needs, rehab, etc.   1. Does the need for close, 24 hr/day medical supervision in concert with the patient's rehab needs make it unreasonable for this patient to be served in a less intensive setting? Yes 2. Co-Morbidities requiring supervision/potential complications: Constipation (Increase bowel reg as necessary), post-surgical pain, ABLA (Monitor and consider transfusion if pt symptomatic in accordance with increased activity), MVR, CAD (monitor for pain during exercises), tachycardia (monitor with increased exercise, especially in the setting of  CAD and valvular dysfunction) 3. Due to bowel management, safety, skin/wound care, disease management, medication administration, pain management and patient education, does the patient require 24 hr/day rehab nursing? Yes 4. Does the patient require coordinated care of a physician, rehab nurse, PT (1.5-2 hrs/day, 5 days/week) and OT (1.5-2 hrs/day, 5 days/week) to address physical and functional deficits in the context of the above medical diagnosis(es)? Yes Addressing deficits in the following areas: balance, endurance, locomotion, strength, transferring, bowel/bladder control, bathing, dressing, toileting and psychosocial support 5. Can the patient actively participate in an intensive therapy program of at least 3 hrs of therapy per day at least 5 days per week? Potentially 6. The potential for patient to make measurable gains while on inpatient rehab is excellent 7. Anticipated functional outcomes upon discharge from inpatient rehab are modified independent and supervision with PT, independent and modified independent with OT, n/a with SLP. 8. Estimated rehab length of stay to reach the above functional goals is: 10-14 days. 9. Does the patient have adequate social supports and living environment to accommodate these discharge functional goals? Yes 10. Anticipated D/C setting: Home 11. Anticipated post D/C treatments: HH therapy and Home excercise program 12. Overall Rehab/Functional Prognosis: excellent  Oral pharmacological pain control  Bowel program: consider colace, miralax and/or Senna. PRN suppository Fall precautions Monitor surgical wound and skin especially over pressure sensitive areas Prevent immobility complications: pressure ulcers, contractures, HO Consider modalities, such as TENs, CPM PT/OT consults for mobility strengthening, endurance training and adaptive ADLs   RECOMMENDATIONS: This patient's condition is appropriate for continued rehabilitative care in the following  setting: CIR Patient has agreed to participate in recommended program. Yes Note that insurance prior authorization may be required for reimbursement for recommended care.  Comment: Rehab Admissions Coordinator to follow up.  Delice Lesch, MD 03/10/2015       Revision History     Date/Time User Provider Type Action   03/10/2015 11:48 AM Peter Lorie Phenix, MD Physician Addend   03/10/2015 11:43 AM Peter Lorie Phenix, MD Physician Sign   03/10/2015 7:07 AM Cathlyn Parsons, PA-C Physician Assistant [place]   View Details Report       Routing History     Date/Time From To Method   03/10/2015 11:48 AM Peter Lorie Phenix, MD Peter Lorie Phenix, MD In [facility]   03/10/2015 11:48 AM Peter Lorie Phenix, MD Rosalita Chessman, DO In Basket

## 2015-03-14 NOTE — Progress Notes (Signed)
ANTICOAGULATION CONSULT NOTE - Initial Consult  Pharmacy Consult for Coumadin Indication: VTE prophylaxis  Allergies  Allergen Reactions  . Statins Other (See Comments)    Messes with liver function  . Codeine     REACTION: nausea  . Tape Rash    Patient Measurements: Height: 5' 10"  (177.8 cm) Weight: 226 lb 9.6 oz (102.785 kg) IBW/kg (Calculated) : 73  Vital Signs: Temp: 98.2 F (36.8 C) (10/24 1716) Temp Source: Oral (10/24 1716) BP: 136/57 mmHg (10/24 1716) Pulse Rate: 82 (10/24 1716)  Labs:  Recent Labs  03/12/15 0457 03/13/15 0459 03/14/15 0521 03/14/15 0715  HGB 10.2* 8.8* 8.3*  --   HCT 30.0* 25.6* 24.8*  --   PLT 183 152 189  --   LABPROT 17.7* 19.8*  --  21.0*  INR 1.45 1.68*  --  1.81*  CREATININE 0.89  --   --   --     Estimated Creatinine Clearance: 104.7 mL/min (by C-G formula based on Cr of 0.89).   Medical History: Past Medical History  Diagnosis Date  . MVP (mitral valve prolapse)   . MR (mitral regurgitation)     severe; mitral valve repair in march 2009  . Atrial flutter ([place])     s/p ablation  . Hyperlipidemia   . CAD (coronary artery disease)     cath 06/2007 40% LM  . CVD (cerebrovascular disease)     59% right ICA, 49% left ICA; h/oTIA; fu study 8/11 with normal carotids  . History of colonoscopy   . Osteoarthritis     both knees  . Dysrhythmia   . Heart murmur   . Shortness of breath dyspnea     increased exertion   . History of bronchitis   . Gait abnormality   . Horseshoe kidney    Assessment:   POD # 5 bilateral TKA at [facility]. Transferred to [place] today.   Pharmacy has been assisting with Coumadin for VTE prophylaxis since 10/20,  POD#1.   Has also been on Lovenxo 30 mg sq q12hrs since after epidural out on 10/20.  INR is 1.81 today, approaching goal after 4 mg x 1 then 7.5 mg daily x 3 doses.  Hgb trended down, Niferex 150 mg daily added today.  No bleeding noted.  Goal of Therapy:  INR 2-3 Monitor platelets  by anticoagulation protocol: Yes   Plan:   Coumadin 7.5 mg again today, though expect maintenance dose will be lower.  Daily PT/INR.  Stop Lovenox when INR >2.  Coumadin planned for 4 weeks per Ortho, then resume Aspirin 81 mg daily.  Arty Baumgartner, [place] Pager: [phone] 03/14/2015,5:31 PM

## 2015-03-14 NOTE — Progress Notes (Signed)
Rehab admissions - I called [employer] carrier.  We do not have a decision yet about possible inpatient rehab admission, but we should hear back from insurance carrier today.  Call me for questions.  #[phone]

## 2015-03-14 NOTE — Progress Notes (Addendum)
Physical Therapy Treatment Patient Details Name: [name] MRN: [id number] DOB: [birth year] Today's Date: 03/14/2015    History of Present Illness Pt admitted for bilateral TKA;     PT Comments    Patient ambulated today without either KI, progressing with ROM each knee. Ready for CIR. Will benefit from CIR, has Steps to enter and bed/bath upstairs.  BP supine=127/62, after ambulation 102/60, no c/o dizziness HR 72-80  Follow Up Recommendations  CIR     Equipment Recommendations  None recommended by PT    Recommendations for Other Services       Precautions / Restrictions Precautions Precautions: Knee;Fall Required Braces or Orthoses: Knee Immobilizer - Right;Knee Immobilizer - Left Knee Immobilizer - Right: Discontinue once straight leg raise with < 10 degree lag Knee Immobilizer - Left: Discontinue once straight leg raise with < 10 degree lag    Mobility  Bed Mobility   Bed Mobility: Supine to Sit     Supine to sit: Min guard     General bed mobility comments: used leg lifter to move the legs to the bed edge and to the floor  Transfers Overall transfer level: Needs assistance Equipment used: Rolling walker (2 wheeled) Transfers: Sit to/from Stand Sit to Stand: From elevated surface;Mod assist         General transfer comment: did not use the R KI, bed raised to facilitate standing, able to get the R knee flexed at least to 80 degrees in prep to stand. able to  push self better to stand and walk the legs back.  Ambulation/Gait Ambulation/Gait assistance: Min assist;+2 safety/equipment Ambulation Distance (Feet): 90 Feet Assistive device: Rolling walker (2 wheeled) Gait Pattern/deviations: Step-to pattern;Decreased step length - right;Decreased step length - left     General Gait Details: removed the L KI after first 50', able to ambulate without either KI, close cues for sequence and to not roll RW when taking a step,    Stairs             Wheelchair Mobility    Modified Rankin (Stroke Patients Only)       Balance                                    Cognition Arousal/Alertness: Awake/alert                          Exercises Total Joint Exercises Ankle Circles/Pumps: AROM;Both;10 reps;Supine Quad Sets: AROM;Both;10 reps;Supine Short Arc Quad: AROM;Both;10 reps;Supine Heel Slides: AAROM;Both;5 reps (holding for 20 secs each.) Hip ABduction/ADduction: AAROM;10 reps;Supine Straight Leg Raises: AAROM;10 reps;Supine Goniometric ROM: L 10-70, R 10-90 knee flexion    General Comments        Pertinent Vitals/Pain Pain Score: 3  Pain Location: bil knees Pain Descriptors / Indicators: Aching;Tightness Pain Intervention(s): Monitored during session;Premedicated before session;Ice applied;Repositioned    Home Living                      Prior Function            PT Goals (current goals can now be found in the care plan section) Progress towards PT goals: Progressing toward goals    Frequency  7X/week    PT Plan Current plan remains appropriate    Co-evaluation             End of Session Equipment  Utilized During Treatment: Gait belt;Left knee immobilizer Activity Tolerance: Patient tolerated treatment well Patient left: in chair;with call bell/phone within reach;with family/visitor present     Time: 1115-1203 PT Time Calculation (min) (ACUTE ONLY): 48 min  Charges:  $Gait Training: 8-22 mins $Therapeutic Exercise: 23-37 mins                    G Codes:      Claretha Cooper 03/14/2015, 12:45 PM Tresa Endo PT [phone]

## 2015-03-14 NOTE — Discharge Summary (Signed)
Physician Discharge Summary   Patient ID: [name] MRN: [id number] DOB/AGE: 62-Jun-1952 62 y.o.  Admit date: 03/09/2015 Discharge date: 03-14-2015  Primary Diagnosis:  Osteoarthritis Bilateral knee(s)  Admission Diagnoses:  Past Medical History  Diagnosis Date  . MVP (mitral valve prolapse)   . MR (mitral regurgitation)     severe; mitral valve repair in march 2009  . Atrial flutter ([place])     s/p ablation  . Hyperlipidemia   . CAD (coronary artery disease)     cath 06/2007 40% LM  . CVD (cerebrovascular disease)     59% right ICA, 49% left ICA; h/oTIA; fu study 8/11 with normal carotids  . History of colonoscopy   . Osteoarthritis     both knees  . Dysrhythmia   . Heart murmur   . Shortness of breath dyspnea     increased exertion   . History of bronchitis   . Gait abnormality   . Horseshoe kidney    Discharge Diagnoses:   Principal Problem:   OA (osteoarthritis) of knee  Estimated body mass index is 31.29 kg/(m^2) as calculated from the following:   Height as of this encounter: _0  (1.753 m).   Weight as of this encounter: 96.163 kg (212 lb).  Procedure:  Procedure(s) (LRB): TOTAL KNEE BILATERAL (Bilateral)   Consults: [facility]  HPI: [name] is a 63 y.o. year old male with end stage OA of both knees with progressively worsening pain and dysfunction. He has constant pain, with activity and at rest and significant functional deficits with difficulties even with ADLs. He has had extensive non-op management including analgesics, injections of cortisone and viscosupplements, and home exercise program, but remains in significant pain with significant dysfunction. We discussed replacing both knees in the same setting versus one at a time including procedure, risks, potential complications, rehab course, and pros and cons associated with each and the patient elects to do both knees at the same time. He presents now for bilateral Total Knee Arthroplasty.    Laboratory Data: Admission on 03/09/2015  Component Date Value Ref Range Status  . WBC 03/10/2015 10.4  4.0 - 10.5 K/uL Final  . RBC 03/10/2015 3.98* 4.22 - 5.81 MIL/uL Final  . Hemoglobin 03/10/2015 12.3* 13.0 - 17.0 g/dL Final  . HCT 03/10/2015 36.7* 39.0 - 52.0 % Final  . MCV 03/10/2015 92.2  78.0 - 100.0 fL Final  . MCH 03/10/2015 30.9  26.0 - 34.0 pg Final  . MCHC 03/10/2015 33.5  30.0 - 36.0 g/dL Final  . RDW 03/10/2015 12.7  11.5 - 15.5 % Final  . Platelets 03/10/2015 164  150 - 400 K/uL Final  . Sodium 03/10/2015 141  135 - 145 mmol/L Final  . Potassium 03/10/2015 3.7  3.5 - 5.1 mmol/L Final  . Chloride 03/10/2015 111  101 - 111 mmol/L Final  . CO2 03/10/2015 24  22 - 32 mmol/L Final  . Glucose, Bld 03/10/2015 118* 65 - 99 mg/dL Final  . BUN 03/10/2015 14  6 - 20 mg/dL Final  . Creatinine, Ser 03/10/2015 0.88  0.61 - 1.24 mg/dL Final  . Calcium 03/10/2015 8.6* 8.9 - 10.3 mg/dL Final  . GFR calc non Af Amer 03/10/2015 >60  >60 mL/min Final  . GFR calc Af Amer 03/10/2015 >60  >60 mL/min Final   Comment: (NOTE) The eGFR has been calculated using the CKD EPI equation. This calculation has not been validated in all clinical situations. eGFR's persistently <60 mL/min signify possible  Chronic Kidney Disease.   . Anion gap 03/10/2015 6  5 - 15 Final  . Prothrombin Time 03/10/2015 15.4* 11.6 - 15.2 seconds Final  . INR 03/10/2015 1.20  0.00 - 1.49 Final  . WBC 03/11/2015 11.8* 4.0 - 10.5 K/uL Final  . RBC 03/11/2015 3.61* 4.22 - 5.81 MIL/uL Final  . Hemoglobin 03/11/2015 11.1* 13.0 - 17.0 g/dL Final  . HCT 03/11/2015 32.9* 39.0 - 52.0 % Final  . MCV 03/11/2015 91.1  78.0 - 100.0 fL Final  . MCH 03/11/2015 30.7  26.0 - 34.0 pg Final  . MCHC 03/11/2015 33.7  30.0 - 36.0 g/dL Final  . RDW 03/11/2015 12.8  11.5 - 15.5 % Final  . Platelets 03/11/2015 182  150 - 400 K/uL Final  . Sodium 03/11/2015 129* 135 - 145 mmol/L Final   Comment: DELTA CHECK NOTED REPEATED TO VERIFY   .  Potassium 03/11/2015 3.6  3.5 - 5.1 mmol/L Final  . Chloride 03/11/2015 96* 101 - 111 mmol/L Final  . CO2 03/11/2015 24  22 - 32 mmol/L Final  . Glucose, Bld 03/11/2015 129* 65 - 99 mg/dL Final  . BUN 03/11/2015 12  6 - 20 mg/dL Final  . Creatinine, Ser 03/11/2015 0.76  0.61 - 1.24 mg/dL Final  . Calcium 03/11/2015 8.2* 8.9 - 10.3 mg/dL Final  . GFR calc non Af Amer 03/11/2015 >60  >60 mL/min Final  . GFR calc Af Amer 03/11/2015 >60  >60 mL/min Final   Comment: (NOTE) The eGFR has been calculated using the CKD EPI equation. This calculation has not been validated in all clinical situations. eGFR's persistently <60 mL/min signify possible Chronic Kidney Disease.   . Anion gap 03/11/2015 9  5 - 15 Final  . Prothrombin Time 03/11/2015 15.3* 11.6 - 15.2 seconds Final  . INR 03/11/2015 1.19  0.00 - 1.49 Final  . WBC 03/12/2015 11.5* 4.0 - 10.5 K/uL Final  . RBC 03/12/2015 3.31* 4.22 - 5.81 MIL/uL Final  . Hemoglobin 03/12/2015 10.2* 13.0 - 17.0 g/dL Final  . HCT 03/12/2015 30.0* 39.0 - 52.0 % Final  . MCV 03/12/2015 90.6  78.0 - 100.0 fL Final  . MCH 03/12/2015 30.8  26.0 - 34.0 pg Final  . MCHC 03/12/2015 34.0  30.0 - 36.0 g/dL Final  . RDW 03/12/2015 12.7  11.5 - 15.5 % Final  . Platelets 03/12/2015 183  150 - 400 K/uL Final  . Sodium 03/12/2015 128* 135 - 145 mmol/L Final  . Potassium 03/12/2015 4.1  3.5 - 5.1 mmol/L Final  . Chloride 03/12/2015 96* 101 - 111 mmol/L Final  . CO2 03/12/2015 24  22 - 32 mmol/L Final  . Glucose, Bld 03/12/2015 125* 65 - 99 mg/dL Final  . BUN 03/12/2015 12  6 - 20 mg/dL Final  . Creatinine, Ser 03/12/2015 0.89  0.61 - 1.24 mg/dL Final  . Calcium 03/12/2015 8.2* 8.9 - 10.3 mg/dL Final  . GFR calc non Af Amer 03/12/2015 >60  >60 mL/min Final  . GFR calc Af Amer 03/12/2015 >60  >60 mL/min Final   Comment: (NOTE) The eGFR has been calculated using the CKD EPI equation. This calculation has not been validated in all clinical situations. eGFR's  persistently <60 mL/min signify possible Chronic Kidney Disease.   . Anion gap 03/12/2015 8  5 - 15 Final  . Prothrombin Time 03/12/2015 17.7* 11.6 - 15.2 seconds Final  . INR 03/12/2015 1.45  0.00 - 1.49 Final  . WBC 03/13/2015 9.6  4.0 -  10.5 K/uL Final  . RBC 03/13/2015 2.88* 4.22 - 5.81 MIL/uL Final  . Hemoglobin 03/13/2015 8.8* 13.0 - 17.0 g/dL Final  . HCT 03/13/2015 25.6* 39.0 - 52.0 % Final  . MCV 03/13/2015 88.9  78.0 - 100.0 fL Final  . MCH 03/13/2015 30.6  26.0 - 34.0 pg Final  . MCHC 03/13/2015 34.4  30.0 - 36.0 g/dL Final  . RDW 03/13/2015 12.6  11.5 - 15.5 % Final  . Platelets 03/13/2015 152  150 - 400 K/uL Final  . Prothrombin Time 03/13/2015 19.8* 11.6 - 15.2 seconds Final  . INR 03/13/2015 1.68* 0.00 - 1.49 Final  Hospital Outpatient Visit on 03/02/2015  Component Date Value Ref Range Status  . MRSA, PCR 03/02/2015 NEGATIVE  NEGATIVE Final  . Staphylococcus aureus 03/02/2015 NEGATIVE  NEGATIVE Final   Comment:        The Xpert SA Assay (FDA approved for NASAL specimens in patients over 49 years of age), is one component of a comprehensive surveillance program.  Test performance has been validated by [facility] for patients greater than or equal to 67 year old. It is not intended to diagnose infection nor to guide or monitor treatment.   Marland Kitchen aPTT 03/02/2015 32  24 - 37 seconds Final  . WBC 03/02/2015 4.5  4.0 - 10.5 K/uL Final  . RBC 03/02/2015 5.03  4.22 - 5.81 MIL/uL Final  . Hemoglobin 03/02/2015 15.6  13.0 - 17.0 g/dL Final  . HCT 03/02/2015 46.2  39.0 - 52.0 % Final  . MCV 03/02/2015 91.8  78.0 - 100.0 fL Final  . MCH 03/02/2015 31.0  26.0 - 34.0 pg Final  . MCHC 03/02/2015 33.8  30.0 - 36.0 g/dL Final  . RDW 03/02/2015 12.6  11.5 - 15.5 % Final  . Platelets 03/02/2015 214  150 - 400 K/uL Final  . Sodium 03/02/2015 138  135 - 145 mmol/L Final  . Potassium 03/02/2015 4.3  3.5 - 5.1 mmol/L Final  . Chloride 03/02/2015 106  101 - 111 mmol/L Final  . CO2  03/02/2015 24  22 - 32 mmol/L Final  . Glucose, Bld 03/02/2015 103* 65 - 99 mg/dL Final  . BUN 03/02/2015 18  6 - 20 mg/dL Final  . Creatinine, Ser 03/02/2015 0.99  0.61 - 1.24 mg/dL Final  . Calcium 03/02/2015 9.5  8.9 - 10.3 mg/dL Final  . Total Protein 03/02/2015 7.6  6.5 - 8.1 g/dL Final  . Albumin 03/02/2015 4.4  3.5 - 5.0 g/dL Final  . AST 03/02/2015 38  15 - 41 U/L Final  . ALT 03/02/2015 68* 17 - 63 U/L Final  . Alkaline Phosphatase 03/02/2015 68  38 - 126 U/L Final  . Total Bilirubin 03/02/2015 0.8  0.3 - 1.2 mg/dL Final  . GFR calc non Af Amer 03/02/2015 >60  >60 mL/min Final  . GFR calc Af Amer 03/02/2015 >60  >60 mL/min Final   Comment: (NOTE) The eGFR has been calculated using the CKD EPI equation. This calculation has not been validated in all clinical situations. eGFR's persistently <60 mL/min signify possible Chronic Kidney Disease.   . Anion gap 03/02/2015 8  5 - 15 Final  . Prothrombin Time 03/02/2015 13.7  11.6 - 15.2 seconds Final  . INR 03/02/2015 1.03  0.00 - 1.49 Final  . ABO/RH(D) 03/02/2015 O POS   Final  . Antibody Screen 03/02/2015 NEG   Final  . Sample Expiration 03/02/2015 03/12/2015   Final  . Color, Urine 03/02/2015 YELLOW  YELLOW Final  . APPearance 03/02/2015 CLEAR  CLEAR Final  . Specific Gravity, Urine 03/02/2015 1.010  1.005 - 1.030 Final  . pH 03/02/2015 7.0  5.0 - 8.0 Final  . Glucose, UA 03/02/2015 NEGATIVE  NEGATIVE mg/dL Final  . Hgb urine dipstick 03/02/2015 NEGATIVE  NEGATIVE Final  . Bilirubin Urine 03/02/2015 NEGATIVE  NEGATIVE Final  . Ketones, ur 03/02/2015 NEGATIVE  NEGATIVE mg/dL Final  . Protein, ur 03/02/2015 NEGATIVE  NEGATIVE mg/dL Final  . Urobilinogen, UA 03/02/2015 0.2  0.0 - 1.0 mg/dL Final  . Nitrite 03/02/2015 NEGATIVE  NEGATIVE Final  . Leukocytes, UA 03/02/2015 NEGATIVE  NEGATIVE Final   MICROSCOPIC NOT DONE ON URINES WITH NEGATIVE PROTEIN, BLOOD, LEUKOCYTES, NITRITE, OR GLUCOSE <1000 mg/dL.  . ABO/RH(D) 03/02/2015 O  POS   Final  Hospital Outpatient Visit on 02/23/2015  Component Date Value Ref Range Status  . Rest HR 02/23/2015 70   Final  . Rest BP 02/23/2015 153/97   Final  . Estimated workload 02/23/2015 1.0   Final  . Peak HR 02/23/2015 67   Final  . LV Systolic Volume 15/17/6160 74   Final  . TID 02/23/2015 1.30   Final  . LV Diastolic Volume 73/71/0626 144   Final  . SSS 02/23/2015 4   Final  . SRS 02/23/2015 3   Final  . SDS 02/23/2015 1   Final  . Phase 1 name 02/23/2015 PRETEST   Final  . Stage 1 Name 02/23/2015 SITTING   Final  . Stage 1 Time 02/23/2015 00:11:40   Final  . Stage 1 Speed 02/23/2015 0.0   Final  . Stage 1 Grade 02/23/2015 0.0   Final  . Stage 1 HR 02/23/2015 61   Final  . Stage 1 SBP 02/23/2015 153   Final  . Stage 1 DBP 02/23/2015 97   Final  . Phase 2 Name 02/23/2015 Exercise   Final  . Stage 2 Name 02/23/2015 Infusion   Final  . Stage 2 Attribute 02/23/2015 Baseline   Final  . Stage 2 Time 02/23/2015 00:00:00   Final  . Stage 2 Speed 02/23/2015 0.0   Final  . Stage 2 Grade 02/23/2015 0.0   Final  . Stage 2 HR 02/23/2015 60   Final  . Phase 3 Name 02/23/2015 Exercise   Final  . Stage 3 Name 02/23/2015 Infusion   Final  . Stage 3 Attribute 02/23/2015 Peak   Final  . Stage 3 Time 02/23/2015 00:01:00   Final  . Stage 3 Speed 02/23/2015 0.0   Final  . Stage 3 Grade 02/23/2015 0.0   Final  . Stage 3 HR 02/23/2015 67   Final  . Phase 4 Name 02/23/2015 Recovery   Final  . Stage 4 Name 02/23/2015 Recovery   Final  . Stage 4 Time 02/23/2015 00:06:14   Final  . Stage 4 Speed 02/23/2015 0.0   Final  . Stage 4 Grade 02/23/2015 0.0   Final  . Stage 4 HR 02/23/2015 75   Final  . Stage 4 SBP 02/23/2015 148   Final  . Stage 4 DBP 02/23/2015 79   Final  . Percent of predicted max HR 02/23/2015 42   Final  Office Visit on 02/17/2015  Component Date Value Ref Range Status  . Cholesterol 02/23/2015 220* 125 - 200 mg/dL Final  . Triglycerides 02/23/2015 107  <150 mg/dL Final    . HDL 02/23/2015 48  >=40 mg/dL Final  . Total CHOL/HDL Ratio 02/23/2015 4.6  <=  5.0 Ratio Final  . VLDL 02/23/2015 21  <30 mg/dL Final  . LDL Cholesterol 02/23/2015 151* <130 mg/dL Final   Comment:   Total Cholesterol/HDL Ratio:CHD Risk                        Coronary Heart Disease Risk Table                                        Men       Women          1/2 Average Risk              3.4        3.3              Average Risk              5.0        4.4           2X Average Risk              9.6        7.1           3X Average Risk             23.4       11.0 Use the calculated Patient Ratio above and the CHD Risk table  to determine the patient's CHD Risk.      X-Rays:No results found.  EKG: Orders placed or performed in visit on 02/17/15  . EKG 12-Lead     Hospital Course: Patient was admitted to [facility] and taken to the OR and underwent the above stated procedure well without complications.  Patient tolerated the procedure well and was later transferred to the recovery room and then to the orthopaedic floor for postoperative care. Anesthesia was consulted postoperatively to place an epidural in for postoperative pain management. The patient was also given PO and IV analgesics for pain control following their surgery.  They were given 24 hours of postoperative antibiotics and started on DVT prophylaxis in the form of Lovenox and Coumadin after the epidural had been removed.   PT and OT were ordered for total joint protocol.  Discharge planning consulted to help with postop disposition and equipment needs. CIR consulted to see if a candidate for inpatient rehab.  Patient had a tough night on the evening of surgery and started to get up OOB with therapy on day one. Hemovac drains were pulled without difficulty on day one.  Foley was left in place.  Unfortunately, the epidural was not working properly and had to be removed on the evening of POD 1.  Lovenox was started the  following day. Continued to work with therapy into day two.  Dressings were changed on day two and both incisions were healing well.  By day three, the patient started to show progress with therapy.  They continued to receive therapy each day for continued total knee protocol.  The incisions were healing well.  They continued to progress on day four and day five at which time the patient was seen in rounds and was ready to go to rehab. At time of summary, waiting on final insurance approval.  Arrangements made for transfer later today, Monday 03/14/2015, to rehab  Take Coumadin for 4 weeks and then discontinue.  The dose may need to  be adjusted based upon the INR.  Please follow the INR and titrate Coumadin dose for a therapeutic range between 2.0 and 3.0 INR.  After completing the 4 weeks of Coumadin, the patient may stop the Coumadin and resume their 81 mg Aspirin daily.  Continue Lovenox injections until the INR is therapeutic at or greater than 2.0.  When INR reaches the therapeutic level of equal to or greater than 2.0, the patient may discontinue the Lovenox injections.  Discharge to rehab Diet - Cardiac diet Follow up - in 2 weeks Activity - WBAT Disposition - Rehab Condition Upon Discharge - Good D/C Meds - See DC Summary DVT Prophylaxis - Lovenox and Coumadin  Discharge Instructions    Call MD / Call 911    Complete by:  As directed   If you experience chest pain or shortness of breath, CALL 911 and be transported to the hospital emergency room.  If you develope a fever above 101 F, pus (white drainage) or increased drainage or redness at the wound, or calf pain, call your surgeon's office.     Change dressing    Complete by:  As directed   Change dressing daily with sterile 4 x 4 inch gauze dressing and apply TED hose. Do not submerge the incision under water.     Constipation Prevention    Complete by:  As directed   Drink plenty of fluids.  Prune juice may be helpful.  You may use  a stool softener, such as Colace (over the counter) 100 mg twice a day.  Use MiraLax (over the counter) for constipation as needed.     Diet - low sodium heart healthy    Complete by:  As directed      Discharge instructions    Complete by:  As directed   Pick up stool softner and laxative for home use following surgery while on pain medications. Do not submerge incision under water. Please use good hand washing techniques while changing dressing each day. May shower starting three days after surgery. Please use a clean towel to pat the incision dry following showers. Continue to use ice for pain and swelling after surgery. Do not use any lotions or creams on the incision until instructed by your surgeon.  Take Coumadin for four weeks and then discontinue.  The dose may need to be adjusted based upon the INR.  Please follow the INR and titrate Coumadin dose for a therapeutic range between 2.0 and 3.0 INR.  After completing the four weeks of Coumadin, the patient may stop the Coumadin and resume their 81 mg Aspirin daily.  Continue Lovenox injections until the INR is therapeutic at or greater than 2.0.  When INR reaches the therapeutic level of equal to or greater than 2.0, the patient may discontinue the Lovenox injections.  Postoperative Constipation Protocol  Constipation - defined medically as fewer than three stools per week and severe constipation as less than one stool per week.  One of the most common issues patients have following surgery is constipation.  Even if you have a regular bowel pattern at home, your normal regimen is likely to be disrupted due to multiple reasons following surgery.  Combination of anesthesia, postoperative narcotics, change in appetite and fluid intake all can affect your bowels.  In order to avoid complications following surgery, here are some recommendations in order to help you during your recovery period.  Colace (docusate) - Pick up an over-the-counter  form of Colace or another stool  softener and take twice a day as long as you are requiring postoperative pain medications.  Take with a full glass of water daily.  If you experience loose stools or diarrhea, hold the colace until you stool forms back up.  If your symptoms do not get better within 1 week or if they get worse, check with your doctor.  Dulcolax (bisacodyl) - Pick up over-the-counter and take as directed by the product packaging as needed to assist with the movement of your bowels.  Take with a full glass of water.  Use this product as needed if not relieved by Colace only.   MiraLax (polyethylene glycol) - Pick up over-the-counter to have on hand.  MiraLax is a solution that will increase the amount of water in your bowels to assist with bowel movements.  Take as directed and can mix with a glass of water, juice, soda, coffee, or tea.  Take if you go more than two days without a movement. Do not use MiraLax more than once per day. Call your doctor if you are still constipated or irregular after using this medication for 7 days in a row.  If you continue to have problems with postoperative constipation, please contact the office for further assistance and recommendations.  If you experience "the worst abdominal pain ever" or develop nausea or vomiting, please contact the office immediatly for further recommendations for treatment.  When discharged from the skilled rehab facility, please have the facility set up the patient's [place] prior to being released.  Please make sure this gets set up prior to release in order to avoid any lapse of therapy following the rehab stay.  Also provide the patient with their medications at time of release from the facility to include their pain medication, the muscle relaxants, and their blood thinner medication.  If the patient is still at the rehab facility at time of follow up appointment, please also assist the patient in arranging  follow up appointment in our office and any transportation needs. ICE to the affected knee or hip every three hours for 30 minutes at a time and then as needed for pain and swelling.     Do not put a pillow under the knee. Place it under the heel.    Complete by:  As directed      Do not sit on low chairs, stoools or toilet seats, as it may be difficult to get up from low surfaces    Complete by:  As directed      Driving restrictions    Complete by:  As directed   No driving until released by the physician.     Increase activity slowly as tolerated    Complete by:  As directed      Lifting restrictions    Complete by:  As directed   No lifting until released by the physician.     Patient may shower    Complete by:  As directed   You may shower without a dressing once there is no drainage.  Do not wash over the wound.  If drainage remains, do not shower until drainage stops.     TED hose    Complete by:  As directed   Use stockings (TED hose) for 3 weeks on both leg(s).  You may remove them at night for sleeping.     Weight bearing as tolerated    Complete by:  As directed   Laterality:  bilateral  Extremity:  Lower            Medication List    STOP taking these medications        amoxicillin 500 MG capsule  Commonly known as:  AMOXIL     aspirin EC 81 MG tablet     meloxicam 15 MG tablet  Commonly known as:  MOBIC     omega-3 acid ethyl esters 1 G capsule  Commonly known as:  LOVAZA     SAW PALMETTO PO      TAKE these medications        acetaminophen 325 MG tablet  Commonly known as:  TYLENOL  Take 2 tablets (650 mg total) by mouth every 6 (six) hours as needed for mild pain (or Fever >/= 101).     bisacodyl 10 MG suppository  Commonly known as:  DULCOLAX  Place 1 suppository (10 mg total) rectally daily as needed for moderate constipation.     cyclobenzaprine 5 MG tablet  Commonly known as:  FLEXERIL  Take 1 tablet (5 mg total) by mouth 3 (three) times  daily as needed for muscle spasms.     diphenhydrAMINE 12.5 MG/5ML elixir  Commonly known as:  BENADRYL  Take 5-10 mLs (12.5-25 mg total) by mouth every 4 (four) hours as needed for itching.     docusate sodium 100 MG capsule  Commonly known as:  COLACE  Take 1 capsule (100 mg total) by mouth 2 (two) times daily.     enoxaparin 30 MG/0.3ML injection  Commonly known as:  LOVENOX  Inject 0.3 mLs (30 mg total) into the skin every 12 (twelve) hours. Continue Lovenox injections until the INR is therapeutic at or greater than 2.0.  When INR reaches the therapeutic level of equal to or greater than 2.0, the patient may discontinue the Lovenox injections.     HYDROmorphone 2 MG tablet  Commonly known as:  DILAUDID  Take 1-2 tablets (2-4 mg total) by mouth every 4 (four) hours as needed for moderate pain or severe pain.     iron polysaccharides 150 MG capsule  Commonly known as:  NIFEREX  Take 1 capsule (150 mg total) by mouth daily. Take for three weeks and then discontinue     metoCLOPramide 5 MG tablet  Commonly known as:  REGLAN  Take 1 tablet (5 mg total) by mouth every 8 (eight) hours as needed for nausea (if ondansetron (ZOFRAN) ineffective.).     naloxegol oxalate 25 MG Tabs tablet  Commonly known as:  MOVANTIK  Take 1 tablet (25 mg total) by mouth daily.     ondansetron 4 MG tablet  Commonly known as:  ZOFRAN  Take 1 tablet (4 mg total) by mouth every 6 (six) hours as needed for nausea.     polyethylene glycol packet  Commonly known as:  MIRALAX / GLYCOLAX  Take 17 g by mouth daily as needed for mild constipation or moderate constipation.     sodium phosphate 7-19 GM/118ML Enem  Place 133 mLs (1 enema total) rectally once as needed for severe constipation.     traMADol 50 MG tablet  Commonly known as:  ULTRAM  Take 1-2 tablets (50-100 mg total) by mouth every 6 (six) hours as needed (mild pain).     warfarin 5 MG tablet  Commonly known as:  COUMADIN  Take 1.5 tablets  (7.5 mg total) by mouth daily. Take Coumadin for four weeks and then discontinue.  The dose may need to be adjusted based upon  the INR.  Please follow the INR and titrate Coumadin dose for a therapeutic range between 2.0 and 3.0 INR.  After completing the four weeks of Coumadin, the patient may stop the Coumadin and resume their 81 mg Aspirin daily.           Follow-up Information    Follow up with Gearlean Alf, MD. Schedule an appointment as soon as possible for a visit on 03/22/2015.   Specialty:  Orthopedic Surgery   Why:  Call office ASAP at [phone] to setup appointment with Dr. Wynelle Link on Tuesday 03/22/2015 or after when released from the Rehab Unit.   Contact information:   [address] [place] [address] [phone]       Signed: Arlee Muslim, PA-C Orthopaedic Surgery 03/14/2015, 7:23 AM

## 2015-03-14 NOTE — Progress Notes (Signed)
Rehab admissions - I have talked with insurance case Freight forwarder.  We should have an answer from [facility] in the next couple hours.  I will inform all once I hear back from case manager of decision.  Call me for questions.  #[phone]

## 2015-03-14 NOTE — Progress Notes (Signed)
   Subjective: 5 Days Post-Op Procedure(s) (LRB): TOTAL KNEE BILATERAL (Bilateral) Patient reports pain as mild.   Patient seen in rounds by Dr. Wynelle Link. Patient is well, but has had some minor complaints of pain in the knees, requiring pain medications Patient is ready to go to rehab.  Objective: Vital signs in last 24 hours: Temp:  [98.3 F (36.8 C)-100.8 F (38.2 C)] 100.8 F (38.2 C) (10/23 2117) Pulse Rate:  [85-91] 85 (10/23 2117) Resp:  [18-20] 18 (10/23 2117) BP: (120-121)/(60-63) 120/63 mmHg (10/23 2117) SpO2:  [96 %-99 %] 99 % (10/23 2117)  Intake/Output from previous day:  Intake/Output Summary (Last 24 hours) at 03/14/15 0709 Last data filed at 03/14/15 0123  Gross per 24 hour  Intake      0 ml  Output    550 ml  Net   -550 ml    Labs:  Recent Labs  03/12/15 0457 03/13/15 0459  HGB 10.2* 8.8*    Recent Labs  03/12/15 0457 03/13/15 0459  WBC 11.5* 9.6  RBC 3.31* 2.88*  HCT 30.0* 25.6*  PLT 183 152    Recent Labs  03/12/15 0457  NA 128*  K 4.1  CL 96*  CO2 24  BUN 12  CREATININE 0.89  GLUCOSE 125*  CALCIUM 8.2*    Recent Labs  03/12/15 0457 03/13/15 0459  INR 1.45 1.68*    EXAM: General - Patient is Alert, Appropriate and Oriented Extremity - Neurovascular intact Sensation intact distally Dorsiflexion/Plantar flexion intact Incision - clean, dry, no drainage, healing Motor Function - intact, moving feet and toes well on exam.   Assessment/Plan: 5 Days Post-Op Procedure(s) (LRB): TOTAL KNEE BILATERAL (Bilateral) Procedure(s) (LRB): TOTAL KNEE BILATERAL (Bilateral) Past Medical History  Diagnosis Date  . MVP (mitral valve prolapse)   . MR (mitral regurgitation)     severe; mitral valve repair in march 2009  . Atrial flutter ([place])     s/p ablation  . Hyperlipidemia   . CAD (coronary artery disease)     cath 06/2007 40% LM  . CVD (cerebrovascular disease)     59% right ICA, 49% left ICA; h/oTIA; fu study 8/11 with normal  carotids  . History of colonoscopy   . Osteoarthritis     both knees  . Dysrhythmia   . Heart murmur   . Shortness of breath dyspnea     increased exertion   . History of bronchitis   . Gait abnormality   . Horseshoe kidney    Principal Problem:   OA (osteoarthritis) of knee  Estimated body mass index is 31.29 kg/(m^2) as calculated from the following:   Height as of this encounter: 5' 9"  (1.753 m).   Weight as of this encounter: 96.163 kg (212 lb). Up with therapy Discharge to rehab Diet - Cardiac diet Follow up - in 2 weeks Activity - WBAT Disposition - Rehab Condition Upon Discharge - Good D/C Meds - See DC Summary DVT Prophylaxis - Lovenox and Coumadin  Arlee Muslim, PA-C Orthopaedic Surgery 03/14/2015, 7:09 AM

## 2015-03-14 NOTE — Progress Notes (Signed)
Rehab admissions - I have approval from [facility] for inpatient rehab admission for today.  Bed available and will admit to inpatient rehab today.  Call me for questions.  #[phone]

## 2015-03-14 NOTE — Progress Notes (Signed)
Physical Therapy Treatment Patient Details Name: [name] MRN: [id number] DOB: 07-07-1950 Today's Date: 03/14/2015    History of Present Illness Pt admitted for bilateral TKA;     PT Comments    Patient progressing well. Plans CIR, hopefully today.  Follow Up Recommendations  CIR     Equipment Recommendations  None recommended by PT    Recommendations for Other Services       Precautions / Restrictions Precautions Precautions: Knee;Fall Required Braces or Orthoses: Knee Immobilizer - Right;Knee Immobilizer - Left Knee Immobilizer - Right: Discontinue once straight leg raise with < 10 degree lag Knee Immobilizer - Left: Discontinue once straight leg raise with < 10 degree lag    Mobility  Bed Mobility Overal bed mobility: Needs Assistance Bed Mobility: Supine to Sit     Supine to sit: Min guard Sit to supine: Mod assist   General bed mobility comments: assist the L leg onto the bed, used leg lifter on the right  Transfers Overall transfer level: Needs assistance Equipment used: Rolling walker (2 wheeled) Transfers: Sit to/from Stand Sit to Stand: Mod assist;From elevated surface;+2 safety/equipment         General transfer comment: did not useeither KI, extra time to power up to stand from the recliner.patient able to flex the Right knee to about 80 degrees to stand,max use of the UE's and walks legs back.Able to reach up 1 hand at a time  for RW. pivot steps from recliner to bed after ther exercises in recliner.  Ambulation/Gait Ambulation/Gait assistance: Min assist;+2 safety/equipment Ambulation Distance (Feet): 115 Feet Assistive device: Rolling walker (2 wheeled) Gait Pattern/deviations: Step-to pattern;Step-through pattern     General Gait Details: no KI used, able to sequence and roll the RW as he steps forward. Cues for posture and position inside of RW for safety.    Stairs            Wheelchair Mobility    Modified Rankin (Stroke  Patients Only)       Balance                                    Cognition Arousal/Alertness: Awake/alert                          Exercises Total Joint Exercises Ankle Circles/Pumps: AROM;Both;10 reps;Supine Quad Sets: AROM;Both;10 reps;Supine Short Arc Quad: AROM;Both;10 reps;Supine Heel Slides: AAROM;Both;5 reps (holding for 20 secs each.) Hip ABduction/ADduction: AAROM;10 reps;Supine Straight Leg Raises: AAROM;10 reps;Supine Long Arc Quad: AAROM;Right;Left;15 reps;Seated Knee Flexion: AAROM;Right;Left;15 reps;Seated Goniometric ROM: R AROM 10-95, L 10 75=seated    General Comments        Pertinent Vitals/Pain Pain Score: 3  Pain Location: bil knees Pain Descriptors / Indicators: Aching;Tightness Pain Intervention(s): Monitored during session;Premedicated before session;Repositioned    Home Living                      Prior Function            PT Goals (current goals can now be found in the care plan section) Progress towards PT goals: Progressing toward goals    Frequency  7X/week    PT Plan Current plan remains appropriate    Co-evaluation             End of Session Equipment Utilized During Treatment: Gait belt Activity Tolerance: Patient tolerated  treatment well Patient left: in bed     Time: 1400-1457 PT Time Calculation (min) (ACUTE ONLY): 57 min  Charges:  $Gait Training: 23-37 mins $Therapeutic Exercise: 23-37 mins                    G Codes:      Claretha Cooper 03/14/2015, 3:59 PM

## 2015-03-14 NOTE — Progress Notes (Signed)
Pt d/c to [facility]. Receiving doctor is Dr. Tessa Lerner. Patient is relieved and pleased to be able to go to CIR today. Assessed for pain before d/c and patient is comfortable. Report given to [facility], nurse on 4MW. Patient going to bed 5 on that unit.

## 2015-03-14 NOTE — Progress Notes (Signed)
Peter Diones, RN Rehab Admission Coordinator Signed Physical Medicine and Rehabilitation PMR Pre-admission 03/11/2015 11:05 AM  Related encounter: Admission (Current) from 03/09/2015 in [place] Collapse All   PMR Admission Coordinator Pre-Admission Assessment  Patient: [name] is an 64 y.o., male MRN: [id number] DOB: Apr 29, 1951 Height: _0  (175.3 cm) Weight: 96.163 kg (212 lb)  Insurance Information HMO: PPO: PCP: IPA: 80/20: OTHER: POS II PRIMARY: [employer] Managed Policy#: [id number] Subscriber: [name] CM Name: Andre Lefort Phone#: [phone] Fax#: [phone] Pre-Cert#: [id number] X 5 days Employer: [employer] Benefits: Phone #: [phone] Name: On line Eff. Date: 05/21/14 Deduct: $500 (met all) Out of Pocket Max: $2750 (met all) Life Max: unlimited CIR: 90% w/auth SNF: 90% w/auth Outpatient: 90% with 60 visit limit Co-Pay: 10% Home Health: 100% Co-Pay: none DME: 90% Co-Pay: 10% Providers: in Therapist, art Information    Name Relation Home Work Mobile   Bucklew,Janice Spouse [phone]     Current Medical History  Patient Admitting Diagnosis: B TKR  History of Present Illness: A 64 y.o.right handed male with history of MVP, mitral regurgitation, atrial flutter status post ablation maintained on aspirin therapy. Patient lives with his wife independently with a cane and sometimes a walker prior to admission. 2 level home with bedroom and full bathroom upstairs. 3 steps to entry of home. Wife can assist as needed. 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 No  relief with conservative care including NSAIDs and/or analgesics, corticosteroid injections ,activity modification. Underwent bilateral total knee arthroplasty 03/09/2015 per Dr. Maureen Ralphs. Hospital course pain management. Weightbearing as tolerated bilateral lower extremities. Coumadin for DVT prophylaxis. Acute blood loss anemia 8.3 and monitored. Physical and occupational therapy evaluations completed. M.D. has requested physical medicine rehabilitation consult. Patient was admitted for comprehensive rehabilitation program Past Medical History  Past Medical History  Diagnosis Date  . MVP (mitral valve prolapse)   . MR (mitral regurgitation)     severe; mitral valve repair in march 2009  . Atrial flutter ([place])     s/p ablation  . Hyperlipidemia   . CAD (coronary artery disease)     cath 06/2007 40% LM  . CVD (cerebrovascular disease)     59% right ICA, 49% left ICA; h/oTIA; fu study 8/11 with normal carotids  . History of colonoscopy   . Osteoarthritis     both knees  . Dysrhythmia   . Heart murmur   . Shortness of breath dyspnea     increased exertion   . History of bronchitis   . Gait abnormality   . Horseshoe kidney     Family History  family history includes Colitis in his daughter; Crohn's disease in his daughter; Diabetes in an other family member; Hyperlipidemia in his father; Liver cancer in his father; Liver disease in his father. There is no history of Colon cancer, Esophageal cancer, Rectal cancer, or Stomach cancer.  Prior Rehab/Hospitalizations: Outpatient therapy at [facility] after back surgery 10/19/14  Has the patient had major surgery during 100 days prior to admission? No. Did have back surgery in May, 2016.  Current Medications   Current facility-administered medications:  . 0.9 % sodium chloride infusion, , Intravenous, Continuous, Arlee Muslim, PA-C, Stopped at 03/11/15 1100 . acetaminophen  (TYLENOL) tablet 650 mg, 650 mg, Oral, Q6H PRN, 650 mg at 03/12/15 1245 **OR** acetaminophen (TYLENOL) suppository 650 mg, 650 mg, Rectal, Q6H PRN, Peter Arabian,  MD . bisacodyl (DULCOLAX) suppository 10 mg, 10 mg, Rectal, Daily PRN, Peter Arabian, MD . coumadin book, , Does not apply, Once, Berton Mount, RPH . cyclobenzaprine (FLEXERIL) tablet 5 mg, 5 mg, Oral, TID PRN, Arlee Muslim, PA-C, 5 mg at 03/14/15 1441 . diphenhydrAMINE (BENADRYL) 12.5 MG/5ML elixir 12.5-25 mg, 12.5-25 mg, Oral, Q4H PRN, Peter Arabian, MD . docusate sodium (COLACE) capsule 100 mg, 100 mg, Oral, BID, Peter Arabian, MD, 100 mg at 03/14/15 0858 . enoxaparin (LOVENOX) injection 30 mg, 30 mg, Subcutaneous, Q12H, Peter Arabian, MD, 30 mg at 03/14/15 1302 . HYDROmorphone (DILAUDID) injection 0.5-1 mg, 0.5-1 mg, Intravenous, Q1H PRN, Peter Arabian, MD, 1 mg at 03/11/15 0148 . HYDROmorphone (DILAUDID) tablet 2-4 mg, 2-4 mg, Oral, Q4H PRN, Peter Arabian, MD, 4 mg at 03/14/15 1302 . iron polysaccharides (NIFEREX) capsule 150 mg, 150 mg, Oral, Daily, Arlee Muslim, PA-C, 150 mg at 03/14/15 0858 . menthol-cetylpyridinium (CEPACOL) lozenge 3 mg, 1 lozenge, Oral, PRN **OR** phenol (CHLORASEPTIC) mouth spray 1 spray, 1 spray, Mouth/Throat, PRN, Peter Arabian, MD . metoCLOPramide (REGLAN) tablet 5-10 mg, 5-10 mg, Oral, Q8H PRN **OR** metoCLOPramide (REGLAN) injection 5-10 mg, 5-10 mg, Intravenous, Q8H PRN, Peter Arabian, MD . naloxegol oxalate (MOVANTIK) tablet 25 mg, 25 mg, Oral, Daily, Peter Arabian, MD, 25 mg at 03/14/15 0858 . ondansetron (ZOFRAN) tablet 4 mg, 4 mg, Oral, Q6H PRN, 4 mg at 03/10/15 2004 **OR** ondansetron (ZOFRAN) injection 4 mg, 4 mg, Intravenous, Q6H PRN, Peter Arabian, MD, 4 mg at 03/10/15 1314 . polyethylene glycol (MIRALAX / GLYCOLAX) packet 17 g, 17 g, Oral, Daily PRN, Peter Arabian, MD, 17 g at 03/13/15 1032 . sodium phosphate (FLEET) 7-19 GM/118ML enema 1 enema, 1 enema, Rectal, Once PRN, Peter Arabian, MD . traMADol Veatrice Bourbon) tablet 50-100 mg, 50-100 mg, Oral, Q6H PRN, Peter Arabian, MD, 100 mg at 03/14/15 1441 . warfarin (COUMADIN) video, , Does not apply, Once, Berton Mount, RPH . Warfarin - Pharmacist Dosing Inpatient, , Does not apply, q1800, Berton Mount, RPH, Stopped at 03/10/15 1800  Patients Current Diet: Diet regular Room service appropriate?: Yes; Fluid consistency:: Thin Diet - low sodium heart healthy  Precautions / Restrictions Precautions Precautions: Knee, Fall Restrictions Weight Bearing Restrictions: No Other Position/Activity Restrictions: WBAT   Has the patient had 2 or more falls or a fall with injury in the past year?No  Prior Activity Level Community (5-7x/wk): Went out daily. Worked FT a Geneticist, molecular. Went to the gym.  Home Assistive Devices / Equipment Home Assistive Devices/Equipment: Eyeglasses  Prior Device Use: Indicate devices/aids used by the patient prior to current illness, exacerbation or injury? Walker and [employer]  Prior Functional Level Prior Function Level of Independence: Independent with assistive device(s) Comments: Pt states using RW prior to admit  Self Care: Did the patient need help bathing, dressing, using the toilet or eating? Independent  Indoor Mobility: Did the patient need assistance with walking from room to room (with or without device)? Independent  Stairs: Did the patient need assistance with internal or external stairs (with or without device)? Independent  Functional Cognition: Did the patient need help planning regular tasks such as shopping or remembering to take medications? Independent  Current Functional Level Cognition  Overall Cognitive Status: Within Functional Limits for tasks assessed Orientation Level: Oriented X4   Extremity Assessment (includes Sensation/Coordination)  Upper Extremity Assessment: Overall WFL for tasks assessed  Lower Extremity Assessment: LLE  deficits/detail, RLE deficits/detail RLE: Unable to fully assess due to pain LLE: Unable to  fully assess due to pain    ADLs  Overall ADL's : Needs assistance/impaired Eating/Feeding: Independent, Sitting Grooming: Wash/dry face, Set up, Bed level Upper Body Bathing: Minimal assitance, Bed level Lower Body Bathing: +2 for physical assistance, +2 for safety/equipment, Maximal assistance, Sit to/from stand Upper Body Dressing : Minimal assistance, Sitting Lower Body Dressing: +2 for physical assistance, Total assistance, Sit to/from stand, +2 for safety/equipment Toilet Transfer: +2 for physical assistance, +2 for safety/equipment, Moderate assistance, Ambulation, RW Toileting- Clothing Manipulation and Hygiene: +2 for safety/equipment, +2 for physical assistance, Total assistance, Sit to/from stand General ADL Comments: Pt unable to let go of walker currently to use UEs in functional ADL to wash periareas or pull up clothing. He is very motivated and wants to do what he can. Educated pt and spouse on AE options for LB self care and he is interested in Geologist, engineering.     Mobility  Overal bed mobility: Needs Assistance Bed Mobility: Supine to Sit Supine to sit: Min guard Sit to supine: Mod assist General bed mobility comments: used leg lifter to move the legs to the bed edge and to the floor    Transfers  Overall transfer level: Needs assistance Equipment used: Rolling walker (2 wheeled) Transfers: Sit to/from Stand Sit to Stand: From elevated surface, Mod assist General transfer comment: did not use the R KI, bed raised to facilitate standing, able to get the R knee flexed at least to 80 degrees in prep to stand. able to push self better to stand and walk the legs back.    Ambulation / Gait / Stairs / Wheelchair Mobility  Ambulation/Gait Ambulation/Gait assistance: Min assist, +2 safety/equipment Ambulation Distance (Feet): 90 Feet Assistive device: Rolling walker (2  wheeled) Gait Pattern/deviations: Step-to pattern, Decreased step length - right, Decreased step length - left General Gait Details: removed the L KI after first 50', able to ambulate without either KI, close cues for sequence and to not roll RW when taking a step,  Gait velocity: decr Gait velocity interpretation: Below normal speed for age/gender    Posture / Balance Balance Overall balance assessment: Needs assistance Sitting-balance support: Feet supported, Bilateral upper extremity supported Sitting balance-Leahy Scale: Fair    Special needs/care consideration BiPAP/CPAP No CPM Yes, bilateral CPM. Has knee immobilizers in place Continuous Drip IV No Dialysis No  Life Vest No Oxygen No Special Bed No Trach Size No Wound Vac (area) No  Skin B TKR incisions with dressings  Bowel mgmt: Last BM 03/09/15 Bladder mgmt: Foley catheter removed 03/11/15 Diabetic mgmt No     Previous Home Environment Living Arrangements: Spouse/significant other Lives With: Spouse, Son, Daughter Available Help at Discharge: Family, Available 24 hours/day Type of Home: House Home Layout: Two level, 1/2 bath on main level, Bed/bath upstairs Alternate Level Stairs-Number of Steps: Flight Home Access: Stairs to enter Technical brewer of Steps: [place]: No  Discharge Living Setting Plans for Discharge Living Setting: Patient's home, House, Lives with (comment) (Lives with wife and children.) Type of Home at Discharge: House Discharge Home Layout: Two level, 1/2 bath on main level, Bed/bath upstairs, Other (Comment) (Has a sleeper sofa and lounge chair main level.) Alternate Level Stairs-Number of Steps: 13 to second level with bedrooms Discharge Home Access: Stairs to enter Entrance Stairs-Number of Steps: 3 to porch entry Does the patient have any problems obtaining your medications?: No  Social/Family/Support Systems Patient  Roles: Spouse, Parent (Has a wife, 23 yo son and a 41 yo daughter.) Contact  Information: Hilman Kissling - wife Anticipated Caregiver: wife Anticipated Caregiver's Contact Information: Thayer Headings (c) [phone] Ability/Limitations of Caregiver: Wife stays at home and can assist. Caregiver Availability: 24/7 Discharge Plan Discussed with Primary Caregiver: Yes Is Caregiver In Agreement with Plan?: Yes Does Caregiver/Family have Issues with Lodging/Transportation while Pt is in Rehab?: No   Goals/Additional Needs Patient/Family Goal for Rehab: PT/OT mod I and supervision goals Expected length of stay: 10-14 days Cultural Considerations: Attends [employer] Dietary Needs: Regular diet, thin liquids Equipment Needs: TBD Pt/Family Agrees to Admission and willing to participate: Yes Program Orientation Provided & Reviewed with Pt/Caregiver Including Roles & Responsibilities: Yes  Decrease burden of Care through IP rehab admission: N/A  Possible need for SNF placement upon discharge: Not anticipated  Patient Condition: This patient's medical and functional status has changed since the consult dated: 03/10/15 in which the Rehabilitation Physician determined and documented that the patient's condition is appropriate for intensive rehabilitative care in an inpatient rehabilitation facility. See "History of Present Illness" (above) for medical update. Functional changes are: Currently requiring mod assist for transfers and min assist to ambulate 90 ft RW. Patient's medical and functional status update has been discussed with the Rehabilitation physician and patient remains appropriate for inpatient rehabilitation. Will admit to inpatient rehab today.  Preadmission Screen Completed By: [name], 03/14/2015 3:10 PM ______________________________________________________________________  Discussed status with Dr. Posey Pronto on 03/14/15 at 1509 and received telephone approval for  admission today.  Admission Coordinator: [name], time1509/Date10/24/16          Cosigned by: [name], MD at 03/14/2015 3:30 PM  Revision History     Date/Time User Provider Type Action   03/14/2015 3:30 PM [name], MD Physician Cosign   03/14/2015 3:11 PM Peter Diones, RN Rehab Admission Coordinator Sign

## 2015-03-15 ENCOUNTER — Inpatient Hospital Stay (HOSPITAL_COMMUNITY): Payer: Managed Care, Other (non HMO) | Admitting: Occupational Therapy

## 2015-03-15 ENCOUNTER — Inpatient Hospital Stay (HOSPITAL_COMMUNITY): Payer: Managed Care, Other (non HMO) | Admitting: Physical Therapy

## 2015-03-15 ENCOUNTER — Ambulatory Visit (HOSPITAL_COMMUNITY): Payer: Managed Care, Other (non HMO) | Attending: Physician Assistant

## 2015-03-15 DIAGNOSIS — R609 Edema, unspecified: Secondary | ICD-10-CM

## 2015-03-15 DIAGNOSIS — M7989 Other specified soft tissue disorders: Secondary | ICD-10-CM | POA: Insufficient documentation

## 2015-03-15 DIAGNOSIS — Z96653 Presence of artificial knee joint, bilateral: Secondary | ICD-10-CM

## 2015-03-15 DIAGNOSIS — D62 Acute posthemorrhagic anemia: Secondary | ICD-10-CM

## 2015-03-15 LAB — PROTIME-INR
INR: 1.98 — ABNORMAL HIGH (ref 0.00–1.49)
PROTHROMBIN TIME: 22.4 s — AB (ref 11.6–15.2)

## 2015-03-15 MED ORDER — WARFARIN SODIUM 5 MG PO TABS
5.0000 mg | ORAL_TABLET | Freq: Once | ORAL | Status: AC
  Administered 2015-03-15: 5 mg via ORAL
  Filled 2015-03-15: qty 1

## 2015-03-15 NOTE — Progress Notes (Signed)
VASCULAR LAB PRELIMINARY  PRELIMINARY  PRELIMINARY  PRELIMINARY  Bilateral lower extremity venous duplex  completed.    Preliminary report:  Bilateral:  No evidence of DVT, superficial thrombosis, or Baker's Cyst.    Altie Savard, RVT 03/15/2015, 5:37 PM

## 2015-03-15 NOTE — Evaluation (Signed)
Occupational Therapy Assessment and Plan and OT Intervention  Patient Details  Name: [name] MRN: [id number] Date of Birth: 1950/09/27  OT Diagnosis: acute pain and muscle weakness (generalized) Rehab Potential: Rehab Potential (ACUTE ONLY): Good ELOS: 7-10 days   Today's Date: 03/15/2015 OT Individual Time: 0800-0900 and 1300-1405 OT Individual Time Calculation (min): 60 min   And 65 min   Problem List:  Patient Active Problem List   Diagnosis Date Noted  . Status post bilateral knee replacements 03/14/2015  . OA (osteoarthritis) of knee 03/09/2015  . Pre-op examination 09/17/2014  . S/P mitral valve repair 03/18/2013  . Obesity (BMI 30-39.9) 02/24/2013  . HAND PAIN, RIGHT 06/05/2010  . CAD 12/28/2009  . CAROTID STENOSIS 12/28/2009  . CEREBROVASCULAR DISEASE 12/28/2009  . PRURITUS ANI 05/26/2009  . HYPERLIPIDEMIA, FAMILIAL 04/26/2009  . DEGENERATIVE JOINT DISEASE, KNEES, BILATERAL 09/27/2008  . FATTY LIVER DISEASE 12/29/2007  . ABNORMAL TRANSAMINASE-LFT'S 12/29/2007  . COLONIC POLYPS, ADENOMATOUS, HX OF 12/25/2007  . 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 FEVER, HX OF 12/22/2007  . MITRAL REGURGITATION, SEVERE 03/19/2007  . HEMOCCULT POSITIVE STOOL 02/03/2007  . SYSTOLIC MURMUR 29/51/8841    Past Medical History:  Past Medical History  Diagnosis Date  . MVP (mitral valve prolapse)   . MR (mitral regurgitation)     severe; mitral valve repair in march 2009  . Atrial flutter ([place])     s/p ablation  . Hyperlipidemia   . CAD (coronary artery disease)     cath 06/2007 40% LM  . CVD (cerebrovascular disease)     59% right ICA, 49% left ICA; h/oTIA; fu study 8/11 with normal carotids  . History of colonoscopy   . Osteoarthritis     both knees  . Dysrhythmia   . Heart murmur   . Shortness of breath dyspnea     increased exertion   . History of bronchitis   . Gait abnormality   . Horseshoe kidney    Past Surgical History:  Past Surgical History  Procedure  Laterality Date  . Tonsillectomy  1960  . Mitral valve repair  06/2007  . Heart ablation    . Cardiac catheterization    . Back surgery      secondary to ruptured disc  . Total knee arthroplasty Bilateral 03/09/2015    Procedure: TOTAL KNEE BILATERAL;  Surgeon: Gaynelle Arabian, MD;  Location: [facility];  Service: Orthopedics;  Laterality: Bilateral;    Assessment & Plan Clinical Impression: Patient is a 64 y.o. year old male with history of MVP, mitral regurgitation, atrial flutter status post ablation maintained on aspirin therapy. Patient lives with his wife independently with a cane and sometimes a walker prior to admission. 2 level home with bedroom and full bathroom upstairs. 3 steps to entry of home. Wife can assist as needed. 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 No relief with conservative care including NSAIDs and/or analgesics, corticosteroid injections ,activity modification. Underwent bilateral total knee arthroplasty 03/09/2015 per Dr. Maureen Ralphs. Hospital course pain management. Weightbearing as tolerated bilateral lower extremities. Coumadin for DVT prophylaxis. Acute blood loss anemia 8.3 and monitored. Patient transferred to CIR on 03/14/2015 .    Patient currently requires Min - mod A with basic self-care skills secondary to muscle weakness, decreased cardiorespiratoy endurance and decreased standing balance, decreased balance strategies and acute pain.  Prior to hospitalization, patient could complete ADLs and IADLs with modified independent .  Patient will benefit from skilled intervention to increase independence with  basic self-care skills and increase level of independence with iADL prior to discharge home with care partner.  Anticipate patient will require no assistance just use of device or increased time and follow up outpatient.  OT - End of Session Activity Tolerance: Decreased this session Endurance Deficit: Yes Endurance  Deficit Description: required multiple rest breaks secondary to fatigue and pain OT Assessment Rehab Potential (ACUTE ONLY): Good Barriers to Discharge: Other (comment) Barriers to Discharge Comments: none known at this time OT Patient demonstrates impairments in the following area(s): Balance;Edema;Endurance;Motor;Safety;Pain OT Basic ADL's Functional Problem(s): Grooming;Bathing;Dressing;Toileting OT Advanced ADL's Functional Problem(s):  (n/a) OT Transfers Functional Problem(s): Toilet;Other (comment) (shower is on 2nd level) OT Additional Impairment(s): None OT Plan OT Intensity: Minimum of 1-2 x/day, 45 to 90 minutes OT Frequency: 5 out of 7 days OT Duration/Estimated Length of Stay: 7-10 days OT Treatment/Interventions: Balance/vestibular training;Community reintegration;Patient/family education;Self Care/advanced ADL retraining;Therapeutic Exercise;UE/LE Coordination activities;UE/LE Strength taining/ROM;Therapeutic Activities;Psychosocial support;Functional mobility training;DME/adaptive equipment instruction;Discharge planning;Cognitive remediation/compensation OT Self Feeding Anticipated Outcome(s):  n/a OT Basic Self-Care Anticipated Outcome(s): Mod I  OT Toileting Anticipated Outcome(s): Mod I  OT Bathroom Transfers Anticipated Outcome(s): Mod I  OT Recommendation Recommendations for Other Services:  (none) Patient destination: Home Follow Up Recommendations: Outpatient OT Equipment Recommended: Other (comment) Equipment Details: pt has all recommended equipment   Skilled Therapeutic Intervention Session 1:Upon entering the room, pt seated on EOB with wife present this session. Pt with 3/10 c/o pain in B knees this session. Pt requiring lifting assistance for sit <>stand from elevated bed level. Pt ambulated with RW and steady assist into bathroom 15' for toileting on elevated toilet seat. Pt requiring mod A for toilet transfer and min A balance when standing for clothing  management and hygiene. Pt ambulating with RW to sink in same manner as above for bathing and dressing tasks. OT implemented reacher and long handled sponge this session for LB self care. Pt requiring min verbal cues for technique but able to increase I with self care tasks. Pt with multiple rest breaks secondary to fatigue and pain this session. Pt returning to bed at end of session for rest and ice applied to B knees. OT educated pt and caregiver on OT purpose, POC, and goals with them verbalizing understanding. Call bell and all needed items within reach.  Session 2: Upon entering the room, pt seated in wheelchair awaiting therapist. Pt with 5/10 c/o pain in B knees this session. Pt requesting to shave this session. Pt seated in wheelchair for shaving with set up A. Pt standing with lifting assistance as he pushed up from armrest and "walked back" feet to come to full standing. Pt standing for 2 minutes to finishing grooming with steady assist. Pt returning to wheelchair with verbal cues to reach back and pt having uncontrolled set secondary to pain and fatigue. OT educated and demonstrated use of long handled reacher, shoe horn, sock aide, in order to don and doff B socks and shoes. OT also inserted elastic laces into shoes in order to increased independence. Pt able to perform task with min verbal cues for technique and increased time. Pt returning to bed at end of session per pt request. Pt utilize leg lifter for B LEs with steady assist. Ice applied to B knees and B LEs elevated. Call bell and all needed items within reach.   OT Evaluation Precautions/Restrictions  Precautions Precautions: Fall Required Braces or Orthoses:  (knee immobilizers d/c'd in acute care) Restrictions Weight Bearing Restrictions: Yes RLE Weight  Bearing: Weight bearing as tolerated LLE Weight Bearing: Weight bearing as tolerated Pain Pain Assessment Pain Assessment: 0-10 Pain Score: 3  Pain Type: Surgical pain Pain  Location: Knee Pain Orientation: Right;Left Pain Descriptors / Indicators: Aching Pain Frequency: Intermittent Pain Onset: Gradual Patients Stated Pain Goal: 0 Pain Intervention(s): Elevated extremity;Emotional support;RN made aware Home Living/Prior [place] expects to be discharged to:: Private residence Living Arrangements: Spouse/significant other Available Help at Discharge: Family, Available 24 hours/day Type of Home: House Home Access: Stairs to enter [employer] of Steps: 3 Entrance Stairs-Rails: Left, Right Home Layout: Two level, 1/2 bath on main level, Bed/bath upstairs Alternate Level Stairs-Number of Steps: Flight Alternate Level Stairs-Rails: Right Bathroom Shower/Tub: Tub/shower unit, Industrial/product designer: Yes  Lives With: Spouse, Son, Daughter Prior Function Level of Independence: Requires assistive device for independence  Able to Take Stairs?: Yes Driving: Yes Vocation: Full time employment Vocation Requirements: push/pull 400# equipment on dollies, lift up to 100# Leisure: Hobbies-yes (Comment) (likes to watch TV and read) Comments: likes to read Vision/Perception  Vision- History Baseline Vision/History: Wears glasses Wears Glasses: Reading only Patient Visual Report: No change from baseline Vision- Assessment Vision Assessment?: No apparent visual deficits  Cognition Overall Cognitive Status: Within Functional Limits for tasks assessed Arousal/Alertness: Awake/alert Orientation Level: Person;Place;Situation Person: Oriented Place: Oriented Situation: Oriented Year: 2016 Month: October Day of Week: Correct Memory: Appears intact Immediate Memory Recall: Sock;Blue;Bed Memory Recall: Sock;Blue;Bed Memory Recall Sock: Without Cue Memory Recall Blue: Without Cue Memory Recall Bed: Without Cue Attention: Selective Selective Attention: Appears intact Awareness: Appears  intact Problem Solving: Appears intact Sensation Sensation Light Touch: Appears Intact Stereognosis: Not tested Hot/Cold: Appears Intact Coordination Gross Motor Movements are Fluid and Coordinated: Yes Fine Motor Movements are Fluid and Coordinated: Yes Motor  Motor Motor: Other (comment) Motor - Skilled Clinical Observations: decreased movement secondary to pain , decreased endurance, and strength Mobility  Bed Mobility Bed Mobility: Supine to Sit;Sit to Supine Supine to Sit: 5: Supervision Sit to Supine: 5: Supervision Transfers Sit to Stand: 4: Min assist;3: Mod assist Sit to Stand Details: Verbal cues for sequencing;Verbal cues for technique;Verbal cues for precautions/safety;Verbal cues for safe use of DME/AE Sit to Stand Details (indicate cue type and reason): increased time, elevated surface Stand to Sit: 4: Min assist Stand to Sit Details (indicate cue type and reason): Verbal cues for safe use of DME/AE;Verbal cues for technique;Verbal cues for sequencing;Verbal cues for precautions/safety  Trunk/Postural Assessment  Cervical Assessment Cervical Assessment: Within Functional Limits Thoracic Assessment Thoracic Assessment: Within Functional Limits Lumbar Assessment Lumbar Assessment: Within Functional Limits Postural Control Postural Control: Deficits on evaluation  Balance Balance Balance Assessed: Yes Static Sitting Balance Static Sitting - Balance Support: No upper extremity supported;Feet supported Static Sitting - Level of Assistance: 6: Modified independent (Device/Increase time) Dynamic Sitting Balance Dynamic Sitting - Balance Support: No upper extremity supported;During functional activity Dynamic Sitting - Level of Assistance: 5: Stand by assistance Dynamic Sitting - Balance Activities: Lateral lean/weight shifting;Forward lean/weight shifting Sitting balance - Comments: while donning socks/shoes Static Standing Balance Static Standing - Balance  Support: Bilateral upper extremity supported Static Standing - Level of Assistance: 5: Stand by assistance Dynamic Standing Balance Dynamic Standing - Balance Support: Right upper extremity supported;Left upper extremity supported;During functional activity Dynamic Standing - Level of Assistance: 4: Min assist Dynamic Standing - Balance Activities: Lateral lean/weight shifting;Forward lean/weight shifting;Reaching for objects;Reaching across midline Extremity/Trunk Assessment RUE Assessment RUE Assessment: Within Functional Limits (4+/5 grossly)  LUE Assessment LUE Assessment: Within Functional Limits (4+/5 grossly)   See Function Navigator for Current Functional Status.   Refer to Care Plan for Long Term Goals  Recommendations for other services: None  Discharge Criteria: Patient will be discharged from OT if patient refuses treatment 3 consecutive times without medical reason, if treatment goals not met, if there is a change in medical status, if patient makes no progress towards goals or if patient is discharged from hospital.  The above assessment, treatment plan, treatment alternatives and goals were discussed and mutually agreed upon: by patient and by family  Phineas Semen 03/15/2015, 11:58 AM

## 2015-03-15 NOTE — Evaluation (Signed)
Physical Therapy Assessment and Plan  Patient Details  Name: [name] MRN: [id number] Date of Birth: 06-May-1951  PT Diagnosis: Coordination disorder, Difficulty walking, Muscle weakness and Pain in bilateral knees Rehab Potential: Excellent ELOS: 7-10 days   Today's Date: 03/15/2015 PT Individual Time: 6010-9323 PT Individual Time Calculation (min): 69 min    Problem List:  Patient Active Problem List   Diagnosis Date Noted  . Status post bilateral knee replacements 03/14/2015  . OA (osteoarthritis) of knee 03/09/2015  . Pre-op examination 09/17/2014  . S/P mitral valve repair 03/18/2013  . Obesity (BMI 30-39.9) 02/24/2013  . HAND PAIN, RIGHT 06/05/2010  . CAD 12/28/2009  . CAROTID STENOSIS 12/28/2009  . CEREBROVASCULAR DISEASE 12/28/2009  . PRURITUS ANI 05/26/2009  . HYPERLIPIDEMIA, FAMILIAL 04/26/2009  . DEGENERATIVE JOINT DISEASE, KNEES, BILATERAL 09/27/2008  . FATTY LIVER DISEASE 12/29/2007  . ABNORMAL TRANSAMINASE-LFT'S 12/29/2007  . COLONIC POLYPS, ADENOMATOUS, HX OF 12/25/2007  . 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 FEVER, HX OF 12/22/2007  . MITRAL REGURGITATION, SEVERE 03/19/2007  . HEMOCCULT POSITIVE STOOL 02/03/2007  . SYSTOLIC MURMUR 55/73/2202    Past Medical History:  Past Medical History  Diagnosis Date  . MVP (mitral valve prolapse)   . MR (mitral regurgitation)     severe; mitral valve repair in march 2009  . Atrial flutter ([place])     s/p ablation  . Hyperlipidemia   . CAD (coronary artery disease)     cath 06/2007 40% LM  . CVD (cerebrovascular disease)     59% right ICA, 49% left ICA; h/oTIA; fu study 8/11 with normal carotids  . History of colonoscopy   . Osteoarthritis     both knees  . Dysrhythmia   . Heart murmur   . Shortness of breath dyspnea     increased exertion   . History of bronchitis   . Gait abnormality   . Horseshoe kidney    Past Surgical History:  Past Surgical History  Procedure Laterality Date  . Tonsillectomy   1960  . Mitral valve repair  06/2007  . Heart ablation    . Cardiac catheterization    . Back surgery      secondary to ruptured disc  . Total knee arthroplasty Bilateral 03/09/2015    Procedure: TOTAL KNEE BILATERAL;  Surgeon: Gaynelle Arabian, MD;  Location: [facility];  Service: Orthopedics;  Laterality: Bilateral;    Assessment & Plan Clinical Impression: A 64 y.o.right handed  male with history of MVP, mitral regurgitation, atrial flutter status post ablation maintained on aspirin therapy. Patient lives with his wife independently with a cane and sometimes a walker prior to admission. 2 level home with bedroom and full bathroom upstairs. 3 steps to entry of home. Wife can assist as needed. 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 No relief with conservative care including NSAIDs and/or analgesics, corticosteroid injections ,activity modification. Underwent bilateral total knee arthroplasty 03/09/2015 per Dr. Maureen Ralphs. Hospital course pain management. Weightbearing as tolerated bilateral lower extremities. Coumadin for DVT prophylaxis. Acute blood loss anemia 8.3 and monitored. Physical and occupational therapy evaluations completed. M.D. has requested physical medicine rehabilitation consult. Patient was admitted for comprehensive rehabilitation program.   Patient currently requires min with mobility secondary to muscle weakness, decreased cardiorespiratoy endurance, unbalanced muscle activation and decreased coordination and decreased standing balance, decreased postural control and decreased balance strategies.  Prior to hospitalization, patient was modified independent  with mobility and lived with Spouse, Son, Daughter in a House home.  Home access is 3Stairs to enter.  Patient will benefit from skilled PT intervention to maximize safe functional mobility, minimize fall risk and decrease caregiver burden for planned discharge home with 24 hour  supervision.  Anticipate patient will benefit from follow up OP at discharge.  PT - End of Session Activity Tolerance: Tolerates 30+ min activity with multiple rests Endurance Deficit: Yes Endurance Deficit Description: activity tolerance limited due to pain PT Assessment Rehab Potential (ACUTE/IP ONLY): Excellent Barriers to Discharge: Inaccessible home environment PT Patient demonstrates impairments in the following area(s): Balance;Endurance;Motor;Pain;Safety PT Transfers Functional Problem(s): Bed Mobility;Bed to Chair;Car;Furniture PT Locomotion Functional Problem(s): Ambulation;Stairs PT Plan PT Intensity: Minimum of 1-2 x/day ,45 to 90 minutes PT Frequency: 5 out of 7 days PT Duration Estimated Length of Stay: 7-10 days PT Treatment/Interventions: Ambulation/gait training;Balance/vestibular training;Discharge planning;DME/adaptive equipment instruction;Functional mobility training;Patient/family education;Neuromuscular re-education;Pain management;Therapeutic Exercise;Therapeutic Activities;Stair training;UE/LE Strength taining/ROM;UE/LE Coordination activities PT Transfers Anticipated Outcome(s): mod I PT Locomotion Anticipated Outcome(s): mod I PT Recommendation Follow Up Recommendations: Outpatient PT Patient destination: Home Equipment Recommended: None recommended by PT Equipment Details: pt already has RW PTA  Skilled Therapeutic Intervention Pt received supine in bed, wife present at bedside, and agreeable to therapy session.  Session focused on initial assessment of ROM, strength, balance, transfers, and gait training.  PT provided patient education on role of PT, goals, and plan of care.  PT instructed patient in safe functional transfers using RW from a variety of surfaces, including car transfer, all with steady assist and increased time. PT instructed patient in amb with RW x100' with steady assist and verbal cues for upright posture, w/c positioning, step length and knee  flexion.  Pt returned to room in w/c total assist for time management and positioned with call bell in reach and needs met.    PT Evaluation Precautions/Restrictions Precautions Precautions: Fall Required Braces or Orthoses:  (knee immobilizers d/c'd in acute care) Restrictions Weight Bearing Restrictions: Yes RLE Weight Bearing: Weight bearing as tolerated LLE Weight Bearing: Weight bearing as tolerated Pain Pain Assessment Pain Assessment: 0-10 Pain Score: 3  Pain Type: Surgical pain Pain Location: Knee Pain Orientation: Right;Left Pain Descriptors / Indicators: Aching Pain Frequency: Intermittent Pain Onset: Gradual Patients Stated Pain Goal: 0 Pain Intervention(s): Elevated extremity;Emotional support;RN made aware Home Living/Prior Functioning Home Living Available Help at Discharge: Family;Available 24 hours/day (wife will be home all day) Type of Home: House Home Access: Stairs to enter [employer] of Steps: 3 Entrance Stairs-Rails: Left;Right (states he will probably hold onto R rail) Home Layout: Two level;1/2 bath on main level;Bed/bath upstairs Alternate Level Stairs-Number of Steps: Flight Alternate Level Stairs-Rails: Right  Lives With: Spouse;Son;Daughter Prior Function Level of Independence: Requires assistive device for independence  Able to Take Stairs?: Yes (tried to minimize stair negotiation 2/2 pain) Driving: Yes 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 Requirements: push/pull 400# equipment on dollies, lift up to 100# Leisure: Hobbies-yes (Comment) (likes to watch TV and read)  Cognition Overall Cognitive Status: Within Functional Limits for tasks assessed Arousal/Alertness: Awake/alert Orientation Level: Oriented X4 Attention: Selective Selective Attention: Appears intact Memory: Appears intact Awareness: Appears intact Problem Solving: Appears intact Sensation Sensation Light Touch: Appears  Intact Coordination Gross Motor Movements are Fluid and Coordinated: Yes Fine Motor Movements are Fluid and Coordinated: Yes Motor  Motor Motor: Other (comment) Motor - Skilled Clinical Observations: decreased fluidity of movement given pain  Mobility Bed Mobility Bed Mobility: Supine to Sit;Sit to Supine Supine to Sit: 5: Supervision Sit to Supine:  5: Supervision Transfers Transfers: Yes Sit to Stand: 4: Min assist Sit to Stand Details: Verbal cues for sequencing;Verbal cues for technique;Verbal cues for precautions/safety;Verbal cues for safe use of DME/AE Sit to Stand Details (indicate cue type and reason): increased time, elevated surface Stand to Sit: 4: Min assist Stand to Sit Details (indicate cue type and reason): Verbal cues for safe use of DME/AE;Verbal cues for technique;Verbal cues for sequencing;Verbal cues for precautions/safety Stand Pivot Transfers: 4: Min guard Locomotion  Ambulation Ambulation: Yes Ambulation/Gait Assistance: 4: Min guard Ambulation Distance (Feet): 100 Feet Assistive device: Rolling walker Gait Gait: Yes Gait Pattern: Impaired Gait Pattern: Step-to pattern;Decreased step length - right;Decreased step length - left;Decreased hip/knee flexion - right;Decreased hip/knee flexion - left;Left hip hike;Right hip hike Gait velocity: decreased due to pain Wheelchair Mobility Wheelchair Mobility: Yes Wheelchair Assistance: 5: Careers information officer: Both upper extremities Wheelchair Parts Management: Needs assistance Distance: 100  Trunk/Postural Assessment  Cervical Assessment Cervical Assessment: Within Functional Limits Thoracic Assessment Thoracic Assessment: Within Functional Limits Lumbar Assessment Lumbar Assessment: Within Functional Limits Postural Control Postural Control: Deficits on evaluation  Balance Balance Balance Assessed: Yes Dynamic Sitting Balance Dynamic Sitting - Balance Support: No upper extremity  supported;During functional activity Dynamic Sitting - Level of Assistance: 5: Stand by assistance Dynamic Sitting - Balance Activities: Lateral lean/weight shifting;Forward lean/weight shifting Sitting balance - Comments: while donning socks/shoes Static Standing Balance Static Standing - Balance Support: Bilateral upper extremity supported Static Standing - Level of Assistance: 5: Stand by assistance Extremity Assessment      RLE Assessment RLE Assessment: Exceptions to [facility] RLE AROM (degrees) Right Knee Extension: 10 Right Knee Flexion: 90 RLE PROM (degrees) Right Knee Flexion: 92 RLE Strength RLE Overall Strength Comments: hip flexion 3/5, knee extension 3/5, knee flexion 2/5, DF/PF 3/5 LLE Assessment LLE Assessment: Exceptions to WFL LLE AROM (degrees) Left Knee Extension: 11 Left Knee Flexion: 77 LLE PROM (degrees) Left Knee Flexion: 84 LLE Strength LLE Overall Strength Comments: hip flexion 3-/5, knee extension 3/5, knee flexion 2/5, DF/PF 3/5   See Function Navigator for Current Functional Status.   Refer to Care Plan for Long Term Goals  Recommendations for other services: None  Discharge Criteria: Patient will be discharged from PT if patient refuses treatment 3 consecutive times without medical reason, if treatment goals not met, if there is a change in medical status, if patient makes no progress towards goals or if patient is discharged from hospital.  The above assessment, treatment plan, treatment alternatives and goals were discussed and mutually agreed upon: by patient and by family  [name] 03/15/2015, 11:57 AM

## 2015-03-15 NOTE — Progress Notes (Signed)
ANTICOAGULATION CONSULT NOTE - Follow Up Consult  Pharmacy Consult for Coumadin Indication: VTE prophylaxis  Allergies  Allergen Reactions  . Statins Other (See Comments)    Messes with liver function  . Codeine     REACTION: nausea  . Tape Rash    Patient Measurements: Height: 5' 10"  (177.8 cm) Weight: 226 lb 9.6 oz (102.785 kg) IBW/kg (Calculated) : 73 Heparin Dosing Weight:   Vital Signs: Temp: 98.6 F (37 C) (10/25 0602) Temp Source: Oral (10/25 0602) BP: 122/67 mmHg (10/25 0602) Pulse Rate: 74 (10/25 0602)  Labs:  Recent Labs  03/13/15 0459 03/14/15 0521 03/14/15 0715 03/14/15 1833 03/15/15 0517  HGB 8.8* 8.3*  --  10.5*  --   HCT 25.6* 24.8*  --  31.0*  --   PLT 152 189  --  210  --   LABPROT 19.8*  --  21.0*  --  22.4*  INR 1.68*  --  1.81*  --  1.98*  CREATININE  --   --   --  0.97  --     Estimated Creatinine Clearance: 96 mL/min (by C-G formula based on Cr of 0.97).   Medications:  Scheduled:  . docusate sodium  100 mg Oral BID  . enoxaparin (LOVENOX) injection  30 mg Subcutaneous Q12H  . Influenza vac split quadrivalent PF  0.5 mL Intramuscular Tomorrow-1000  . iron polysaccharides  150 mg Oral Daily  . naloxegol oxalate  25 mg Oral Daily  . omega-3 acid ethyl esters  1 g Oral BID  . Warfarin - Pharmacist Dosing Inpatient   Does not apply q1800    Assessment: 64yo male s/p B-TKA on 10/19.  INR is approaching goal, at 1.98 today.  Hg is up to 10.5, on Niferex.  No bleeding noted.  Goal of Therapy:  INR 2-3 Monitor platelets by anticoagulation protocol: Yes   Plan:  Coumadin 50m today Continue Lovenox 353mSQ q12 until INR > 2 Watch for s/s of bleeding Coumadin x 4 wks per Ortho, then resume ASA 8178mday.  KenGracy BruinsharmD Clinical Pharmacist [facility]

## 2015-03-15 NOTE — H&P (View-Only) (Signed)
Physical Medicine and Rehabilitation Admission H&P    Chief Complaint:knee pain  HPI: [name] is a 64 y.o.right handed male with history of MVP, mitral regurgitation, atrial flutter status post ablation maintained on aspirin therapy. Patient lives with his wife independently with a cane and sometimes a walker prior to admission. 2 level home with bedroom and full bathroom upstairs. 3 steps to entry of home. Wife can assist as needed. 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 No relief with conservative care including NSAIDs and/or analgesics, corticosteroid injections ,activity modification. Underwent bilateral total knee arthroplasty 03/09/2015 per Dr. Maureen Ralphs. Hospital course pain management. Weightbearing as tolerated bilateral lower extremities. Coumadin for DVT prophylaxis. Acute blood loss anemia 8.3 and monitored. Physical and occupational therapy evaluations completed. M.D. has requested physical medicine rehabilitation consult. Patient was admitted for comprehensive rehabilitation program  ROS Constitutional: Negative for fever and chills.  HENT: Negative for hearing loss.  Eyes: Negative for blurred vision and double vision.   Presbyopia  Respiratory: Negative for cough and shortness of breath.   DOE  Cardiovascular: Negative for chest pain and leg swelling.  Gastrointestinal: Positive for constipation. Negative for nausea, vomiting and abdominal pain.  Genitourinary: Negative for dysuria and hematuria.  Musculoskeletal: Positive for joint pain.  Skin: Negative for rash.  Neurological: Negative for seizures, loss of consciousness and headaches.  All other systems reviewed and are negative   Past Medical History  Diagnosis Date  . MVP (mitral valve prolapse)   . MR (mitral regurgitation)     severe; mitral valve repair in march 2009  . Atrial flutter ([place])     s/p ablation  . Hyperlipidemia   . CAD  (coronary artery disease)     cath 06/2007 40% LM  . CVD (cerebrovascular disease)     59% right ICA, 49% left ICA; h/oTIA; fu study 8/11 with normal carotids  . History of colonoscopy   . Osteoarthritis     both knees  . Dysrhythmia   . Heart murmur   . Shortness of breath dyspnea     increased exertion   . History of bronchitis   . Gait abnormality   . Horseshoe kidney    Past Surgical History  Procedure Laterality Date  . Tonsillectomy  1960  . Mitral valve repair  06/2007  . Heart ablation    . Cardiac catheterization    . Back surgery      secondary to ruptured disc  . Total knee arthroplasty Bilateral 03/09/2015    Procedure: TOTAL KNEE BILATERAL;  Surgeon: Gaynelle Arabian, MD;  Location: [facility];  Service: Orthopedics;  Laterality: Bilateral;   Family History  Problem Relation Age of Onset  . Diabetes    . Liver disease Father     cancer  . Hyperlipidemia Father   . Liver cancer Father   . Colon cancer Neg Hx   . Esophageal cancer Neg Hx   . Rectal cancer Neg Hx   . Stomach cancer Neg Hx   . Colitis Daughter   . Crohn's disease Daughter    Social History:  reports that he has never smoked. He has never used smokeless tobacco. He reports that he drinks alcohol. He reports that he does not use illicit drugs. Allergies:  Allergies  Allergen Reactions  . Statins Other (See Comments)    Messes with liver function  . Codeine     REACTION: nausea  . Tape Rash   Medications Prior to  Admission  Medication Sig Dispense Refill  . aspirin EC 81 MG tablet Take 81 mg by mouth daily.      . meloxicam (MOBIC) 15 MG tablet Take 15 mg by mouth daily.    Marland Kitchen omega-3 acid ethyl esters (LOVAZA) 1 G capsule TAKE 2 CAPSULES BY MOUTH TWICE A DAY 120 capsule 5  . Saw Palmetto, Serenoa repens, (SAW PALMETTO PO) Take 2 tablets by mouth daily.     Marland Kitchen amoxicillin (AMOXIL) 500 MG capsule Take 500 mg by mouth as directed. FOR DENTAL WORK ONLY      Home: Home Living Family/patient expects  to be discharged to:: Inpatient rehab Living Arrangements: Spouse/significant other Available Help at Discharge: Family, Available 24 hours/day Type of Home: House Home Access: Stairs to enter [employer] of Steps: 3 Home Layout: Two level, 1/2 bath on main level, Bed/bath upstairs Alternate Level Stairs-Number of Steps: Flight  Lives With: Spouse, Son, Daughter   Functional History: Prior Function Level of Independence: Independent with assistive device(s) Comments: Pt states using RW prior to admit  Functional Status:  Mobility: Bed Mobility Overal bed mobility: Needs Assistance Bed Mobility: Supine to Sit Supine to sit: Min guard Sit to supine: Mod assist General bed mobility comments: assist the L leg onto the bed, used leg lifter on the right Transfers Overall transfer level: Needs assistance Equipment used: Rolling walker (2 wheeled) Transfers: Sit to/from Stand Sit to Stand: Mod assist, From elevated surface, +2 safety/equipment General transfer comment: did not useeither KI, extra time to power up to stand from the recliner.patient able to flex the Right knee to about 80 degrees to stand,max use of the UE's and walks legs back.Able to reach up 1 hand at a time  for RW. pivot steps from recliner to bed after ther exercises in recliner. Ambulation/Gait Ambulation/Gait assistance: Min assist, +2 safety/equipment Ambulation Distance (Feet): 115 Feet Assistive device: Rolling walker (2 wheeled) Gait Pattern/deviations: Step-to pattern, Step-through pattern General Gait Details: no KI used, able to sequence and roll the RW as he steps forward. Cues for posture and position inside of RW for safety.  Gait velocity: decr Gait velocity interpretation: Below normal speed for age/gender    ADL: ADL Overall ADL's : Needs assistance/impaired Eating/Feeding: Independent, Sitting Grooming: Wash/dry face, Set up, Bed level Upper Body Bathing: Minimal assitance, Bed  level Lower Body Bathing: +2 for physical assistance, +2 for safety/equipment, Maximal assistance, Sit to/from stand Upper Body Dressing : Minimal assistance, Sitting Lower Body Dressing: +2 for physical assistance, Total assistance, Sit to/from stand, +2 for safety/equipment Toilet Transfer: +2 for physical assistance, +2 for safety/equipment, Moderate assistance, Ambulation, RW Toileting- Clothing Manipulation and Hygiene: +2 for safety/equipment, +2 for physical assistance, Total assistance, Sit to/from stand General ADL Comments: Pt unable to let go of walker currently to use UEs in functional ADL to wash periareas or pull up clothing. He is very motivated and wants to do what he can. Educated pt and spouse on AE options for LB self care and he is interested in Geologist, engineering.   Cognition: Cognition Overall Cognitive Status: Within Functional Limits for tasks assessed Orientation Level: Oriented X4 Cognition Arousal/Alertness: Awake/alert Behavior During Therapy: WFL for tasks assessed/performed Overall Cognitive Status: Within Functional Limits for tasks assessed  Physical Exam: Blood pressure 148/58, pulse 88, temperature 99.8 F (37.7 C), temperature source Oral, resp. rate 18, height 5' 9"  (1.753 m), weight 96.163 kg (212 lb), SpO2 98 %. Physical Exam Constitutional: He is oriented to person, place, and time.  He appears well-developed and well-nourished.  HENT:  Head: Normocephalic and atraumatic.  Eyes: Conjunctivae and EOM are normal.  Neck: Normal range of motion. Neck supple. No thyromegaly present.  Cardiovascular: Regular rhythm.  No murmur heard. Tachycardic  Respiratory: Effort normal and breath sounds normal. No respiratory distress.  GI: Soft. Bowel sounds are normal. He exhibits no distension.  Musculoskeletal: He exhibits edema and tenderness.  Strength b/l UE 5/5 grossly B/l hip flexion 2+/5 hip flex (inhibited by pain), 5/5 ankle dorsi/plantar flexion grossly   Neurological: He is alert and oriented to person, place, and time.  Skin: Skin is warm and dry.  Bilateral knee incisions are dressed appropriately tender  Psychiatric: He has a normal mood and affect. His behavior is normal   Results for orders placed or performed during the hospital encounter of 03/09/15 (from the past 48 hour(s))  CBC     Status: Abnormal   Collection Time: 03/13/15  4:59 AM  Result Value Ref Range   WBC 9.6 4.0 - 10.5 K/uL   RBC 2.88 (L) 4.22 - 5.81 MIL/uL   Hemoglobin 8.8 (L) 13.0 - 17.0 g/dL   HCT 25.6 (L) 39.0 - 52.0 %   MCV 88.9 78.0 - 100.0 fL   MCH 30.6 26.0 - 34.0 pg   MCHC 34.4 30.0 - 36.0 g/dL   RDW 12.6 11.5 - 15.5 %   Platelets 152 150 - 400 K/uL  Protime-INR     Status: Abnormal   Collection Time: 03/13/15  4:59 AM  Result Value Ref Range   Prothrombin Time 19.8 (H) 11.6 - 15.2 seconds   INR 1.68 (H) 0.00 - 1.49  CBC     Status: Abnormal   Collection Time: 03/14/15  5:21 AM  Result Value Ref Range   WBC 7.7 4.0 - 10.5 K/uL   RBC 2.74 (L) 4.22 - 5.81 MIL/uL   Hemoglobin 8.3 (L) 13.0 - 17.0 g/dL   HCT 24.8 (L) 39.0 - 52.0 %   MCV 90.5 78.0 - 100.0 fL   MCH 30.3 26.0 - 34.0 pg   MCHC 33.5 30.0 - 36.0 g/dL   RDW 12.8 11.5 - 15.5 %   Platelets 189 150 - 400 K/uL  Protime-INR     Status: Abnormal   Collection Time: 03/14/15  7:15 AM  Result Value Ref Range   Prothrombin Time 21.0 (H) 11.6 - 15.2 seconds   INR 1.81 (H) 0.00 - 1.49   No results found.     Medical Problem List and Plan: 1. 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  DVT Prophylaxis/Anticoagulation: Coumadin therapy. Monitor for any bleeding episodes. Check vascular study 3. Pain Management: Robaxin and Ultram as needed. Monitor with increased mobility 4. Constipation. Laxative assistance 5. Neuropsych: This patient is capable of making decisions on his own behalf. 6. Skin/Wound Care: Routine skin checks 7.  Fluids/Electrolytes/Nutrition: Routine I&O . Follow-up chemistries 8.Mitral valve prolapse/ atrial flutter status post ablation. Resume aspirin after Coumadin completed 9. Hyperlipidemia.Lovaza 10.ABLA. Continue iron supplement. Follow-up CBC 11. Constipation.Laxative assist  Post Admission Physician Evaluation: 1. Functional deficits secondary  to bilateral total knee arthroplasty secondary to end-stage osteoarthritis. 2. Patient is admitted to receive collaborative, interdisciplinary care between the physiatrist, rehab nursing staff, and therapy team. 3. Patient's level of medical complexity and substantial therapy needs in context of that medical necessity cannot be provided at a lesser intensity of care such as a SNF. 4. Patient has experienced substantial functional loss from  his/her baseline which was documented above under the "Functional History" and "Functional Status" headings.  Judging by the patient's diagnosis, physical exam, and functional history, the patient has potential for functional progress which will result in measurable gains while on inpatient rehab.  These gains will be of substantial and practical use upon discharge  in facilitating mobility and self-care at the household level. 5. Physiatrist will provide 24 hour management of medical needs as well as oversight of the therapy plan/treatment and provide guidance as appropriate regarding the interaction of the two. 6. 24 hour rehab nursing will assist with bowel management, safety, skin/wound care, disease management, medication administration, pain management and patient education and help integrate therapy concepts, techniques,education, etc. 7. PT will assess and treat for/with: Lower extremity strength, range of motion, stamina, balance, functional mobility, safety, adaptive techniques and equipment, woundcare, coping skills, pain control, joint replacement education.   Goals are: Mod I/Supervision. 8. OT will assess and  treat for/with: ADL's, functional mobility, safety, upper extremity strength, adaptive techniques and equipment, wound mgt, ego support, and community reintegration.   Goals are: Mod I/Supervision. Therapy may not proceed with showering this patient. 9. Case Management and Social Worker will assess and treat for psychological issues and discharge planning. 10. Team conference will be held weekly to assess progress toward goals and to determine barriers to discharge. 11. Patient will receive at least 3 hours of therapy per day at least 5 days per week. 12. ELOS: 10-12 days.       13. Prognosis:  excellent    Delice Lesch, MD  03/14/2015

## 2015-03-15 NOTE — Interval H&P Note (Signed)
[name] was admitted today to Inpatient Rehabilitation with the diagnosis of knee osteoarthritis s/p bilateral TKR.  The patient's history has been reviewed, patient examined, and there is no change in status.  Patient continues to be appropriate for intensive inpatient rehabilitation.  I have reviewed the patient's chart and labs.  Questions were answered to the patient's satisfaction. The PAPE has been reviewed and assessment remains appropriate.  Charlett Blake 03/15/2015, 6:57 AM

## 2015-03-15 NOTE — Progress Notes (Signed)
64 y.o.right handed male with history of MVP, mitral regurgitation, atrial flutter status post ablation maintained on aspirin therapy. Patient lives with his wife independently with a cane and sometimes a walker prior to admission. 2 level home with bedroom and full bathroom upstairs. 3 steps to entry of home. Wife can assist as needed. 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 No relief with conservative care including NSAIDs and/or analgesics, corticosteroid injections ,activity modification. Underwent bilateral total knee arthroplasty 03/09/2015 per Dr. Maureen Ralphs. Hospital course pain management. Weightbearing as tolerated bilateral lower extremities. Coumadin for DVT prophylaxis  Subjective/Complaints: No BM for several days Voiding ok Pain ok with dilaudid No ice packs since admit, no CPM  Objective: Vital Signs: Blood pressure 122/67, pulse 74, temperature 98.6 F (37 C), temperature source Oral, resp. rate 18, height 5' 10"  (1.778 m), weight 102.785 kg (226 lb 9.6 oz), SpO2 96 %. No results found. Results for orders placed or performed during the hospital encounter of 03/14/15 (from the past 72 hour(s))  CBC WITH DIFFERENTIAL     Status: Abnormal   Collection Time: 03/14/15  6:33 PM  Result Value Ref Range   WBC 8.7 4.0 - 10.5 K/uL   RBC 3.47 (L) 4.22 - 5.81 MIL/uL   Hemoglobin 10.5 (L) 13.0 - 17.0 g/dL   HCT 31.0 (L) 39.0 - 52.0 %   MCV 89.3 78.0 - 100.0 fL   MCH 30.3 26.0 - 34.0 pg   MCHC 33.9 30.0 - 36.0 g/dL   RDW 12.8 11.5 - 15.5 %   Platelets 210 150 - 400 K/uL   Neutrophils Relative % 65 %   Neutro Abs 5.7 1.7 - 7.7 K/uL   Lymphocytes Relative 20 %   Lymphs Abs 1.7 0.7 - 4.0 K/uL   Monocytes Relative 11 %   Monocytes Absolute 1.0 0.1 - 1.0 K/uL   Eosinophils Relative 4 %   Eosinophils Absolute 0.3 0.0 - 0.7 K/uL   Basophils Relative 0 %   Basophils Absolute 0.0 0.0 - 0.1 K/uL  Comprehensive metabolic panel     Status:  Abnormal   Collection Time: 03/14/15  6:33 PM  Result Value Ref Range   Sodium 131 (L) 135 - 145 mmol/L   Potassium 4.1 3.5 - 5.1 mmol/L   Chloride 97 (L) 101 - 111 mmol/L   CO2 28 22 - 32 mmol/L   Glucose, Bld 120 (H) 65 - 99 mg/dL   BUN 13 6 - 20 mg/dL   Creatinine, Ser 0.97 0.61 - 1.24 mg/dL   Calcium 8.0 (L) 8.9 - 10.3 mg/dL   Total Protein 5.6 (L) 6.5 - 8.1 g/dL   Albumin 2.5 (L) 3.5 - 5.0 g/dL   AST 93 (H) 15 - 41 U/L   ALT 90 (H) 17 - 63 U/L   Alkaline Phosphatase 86 38 - 126 U/L   Total Bilirubin 1.6 (H) 0.3 - 1.2 mg/dL   GFR calc non Af Amer >60 >60 mL/min   GFR calc Af Amer >60 >60 mL/min    Comment: (NOTE) The eGFR has been calculated using the CKD EPI equation. This calculation has not been validated in all clinical situations. eGFR's persistently <60 mL/min signify possible Chronic Kidney Disease.    Anion gap 6 5 - 15  Protime-INR     Status: Abnormal   Collection Time: 03/15/15  5:17 AM  Result Value Ref Range   Prothrombin Time 22.4 (H) 11.6 - 15.2 seconds   INR 1.98 (H) 0.00 -  1.50     HEENT: normal Cardio: RRR and no murmur Resp: CTA B/L and unlabored GI: BS positive and NT, ND Extremity:  Edema bilateral pretibial Skin:   Intact and Bruise bilateral pretib, Knee incisiton CDI Neuro: Alert/Oriented, Anxious and Abnormal Motor 4- bilateral HF, KE 5/5 ADF, 5/5 in BUE Musc/Skel:  Extremity tender periincisonal and Other medial knee bilateral erythema Gen NAD   Assessment/Plan: 1. 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 Physiatrist is providing close team supervision and 24 hour management of active medical problems listed below. Physiatrist and rehab team continue to assess barriers to discharge/monitor patient progress toward functional and medical goals. FIM:                                   Medical Problem List and Plan: 1. 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  DVT Prophylaxis/Anticoagulation: Coumadin therapy. Monitor for any bleeding episodes. Check vascular study 3. Pain Management: Robaxin and Ultram as needed. Monitor with increased mobility 4. Constipation. Laxative assistance 5. Neuropsych: This patient is capable of making decisions on his own behalf. 6. Skin/Wound Care: Routine skin checks 7. Fluids/Electrolytes/Nutrition: Routine I&O . Follow-up chemistries 8.Mitral valve prolapse/ atrial flutter status post ablation. Resume aspirin after Coumadin completed 9. Hyperlipidemia.Lovaza 10.ABLA. Continue iron supplement. Follow-up CBC, hgb 10.5 11. Constipation.Laxative assist   LOS (Days) 1 A FACE TO FACE EVALUATION WAS PERFORMED  Erby Sanderson E 03/15/2015, 6:59 AM

## 2015-03-16 ENCOUNTER — Inpatient Hospital Stay (HOSPITAL_COMMUNITY): Payer: Managed Care, Other (non HMO) | Admitting: Occupational Therapy

## 2015-03-16 ENCOUNTER — Inpatient Hospital Stay (HOSPITAL_COMMUNITY): Payer: Managed Care, Other (non HMO)

## 2015-03-16 ENCOUNTER — Inpatient Hospital Stay (HOSPITAL_COMMUNITY): Payer: Managed Care, Other (non HMO) | Admitting: Physical Therapy

## 2015-03-16 LAB — PROTIME-INR
INR: 2.27 — ABNORMAL HIGH (ref 0.00–1.49)
Prothrombin Time: 24.8 seconds — ABNORMAL HIGH (ref 11.6–15.2)

## 2015-03-16 MED ORDER — WARFARIN SODIUM 5 MG PO TABS
5.0000 mg | ORAL_TABLET | Freq: Once | ORAL | Status: AC
  Administered 2015-03-16: 5 mg via ORAL
  Filled 2015-03-16: qty 1

## 2015-03-16 NOTE — Patient Care Conference (Signed)
Inpatient RehabilitationTeam Conference and Plan of Care Update Date: 03/15/2015   Time: 11:15 AM    Patient Name: [name]      Medical Record Number: [id number]  Date of Birth: 1950-12-28 Sex: Male         Room/Bed: 4M05C/4M05C-01 Payor Info: Payor: Holland Falling / Plan: Alwyn Pea / Product Type: *No Product type* /    Admitting Diagnosis: b tkr  Admit Date/Time:  03/14/2015  5:15 PM Admission Comments: No comment available   Primary Diagnosis:  <principal problem not specified> Principal Problem: <principal problem not specified>  Patient Active Problem List   Diagnosis Date Noted  . Status post bilateral knee replacements 03/14/2015  . OA (osteoarthritis) of knee 03/09/2015  . Pre-op examination 09/17/2014  . S/P mitral valve repair 03/18/2013  . Obesity (BMI 30-39.9) 02/24/2013  . HAND PAIN, RIGHT 06/05/2010  . CAD 12/28/2009  . CAROTID STENOSIS 12/28/2009  . CEREBROVASCULAR DISEASE 12/28/2009  . PRURITUS ANI 05/26/2009  . HYPERLIPIDEMIA, FAMILIAL 04/26/2009  . DEGENERATIVE JOINT DISEASE, KNEES, BILATERAL 09/27/2008  . FATTY LIVER DISEASE 12/29/2007  . ABNORMAL TRANSAMINASE-LFT'S 12/29/2007  . COLONIC POLYPS, ADENOMATOUS, HX OF 12/25/2007  . 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 FEVER, HX OF 12/22/2007  . MITRAL REGURGITATION, SEVERE 03/19/2007  . HEMOCCULT POSITIVE STOOL 02/03/2007  . SYSTOLIC MURMUR 65/46/5035    Expected Discharge Date: Expected Discharge Date: 03/23/15  Team Members Present: Physician leading conference: Dr. Delice Lesch Social Worker Present: Lennart Pall, LCSW Nurse Present: Dorien Chihuahua, RN PT Present: Dwyane Dee, PT;Rebecca Varner, Dustin Folks, PT OT Present: Benay Pillow, OT;Roanna Epley, COTA SLP Present: Weston Anna, SLP PPS Coordinator present : Daiva Nakayama, RN, CRRN     Current Status/Progress Goal Weekly Team Focus  Medical   B/l TKR with pain  Improve pain and ambulation  See above   Bowel/Bladder   continent of  bowel and bladder. LBM 03/16/15.  remain continent of bowel and bladder with min assist.   offer toileting Q 2 hours and prn. Encourage use of stool softeners.    Swallow/Nutrition/ Hydration             ADL's   Min - mod A for sit <>stand, min A functional ambulation, mod A toilet transfer, Min A toileting, Min A for LB self care with AE, UB self care with set up  Mod I overall  pt/family education, self care retraining, standing tolerance, balance   Mobility   min assist, mod assist when fatigued  mod I overall  HEP, safe transfers, progressing gait distance   Communication             Safety/Cognition/ Behavioral Observations  Pt occaisonally getting up out of bed without assistance. Bed alarm set.   Remain free of falls and injury  Encourage use of call bell. Use necessary safety precautions.    Pain   Pt has complaints of bilateral knee pain. Ultram 154m and robaxin q6 hours. Ice used intermittently.   Pt will rate pain at 4 or less on a scale of 0-10.   Assess pain Q4 hours and prn. Apply ice to bilateral knees.    Skin   Bilateral knee incisions. Gauze dressing changed daily. Bilateral lower extremity edema and bruising.   Remain free of skin breakdown and infection.   Assess skin q shift and prn.     Rehab Goals Patient on target to meet rehab goals: Yes *See Care Plan and progress notes for long and short-term goals.  Barriers to  Discharge: Pain management, abnormal electrolytes    Possible Resolutions to Barriers:  Optimize pain meds, replace electrolytes as needed    Discharge Planning/Teaching Needs:  Home with wife who can provide 24/7 assistance.      Team Discussion:  New eval and doing well.  Min/ mod overall but setting goals for mod independent  Revisions to Treatment Plan:  None   Continued Need for Acute Rehabilitation Level of Care: The patient requires daily medical management by a physician with specialized training in physical medicine and  rehabilitation for the following conditions: Daily direction of a multidisciplinary physical rehabilitation program to ensure safe treatment while eliciting the highest outcome that is of practical value to the patient.: Yes Daily medical management of patient stability for increased activity during participation in an intensive rehabilitation regime.: Yes Daily analysis of laboratory values and/or radiology reports with any subsequent need for medication adjustment of medical intervention for : Post surgical problems  Karaline Buresh 03/16/2015, 4:04 PM

## 2015-03-16 NOTE — Progress Notes (Signed)
Social Work Patient ID: [name], male   DOB: 1950-07-07, 64 y.o.   MRN: [id number]   Have reviewed team conference with pt and wife.  Both aware and agreeable with targeted d/c date of 11/2 with mod independent goals.  Pt very pleased with gains made within first couple of days.  Will follow for d/c planning needs.  Andree Heeg, LCSW

## 2015-03-16 NOTE — Progress Notes (Signed)
Physical Therapy Session Note  Patient Details  Name: [name] MRN: [id number] Date of Birth: 1951/04/09  Today's Date: 03/16/2015 PT Individual Time: 0800-0900 PT Individual Time Calculation (min): 60 min   Short Term Goals: Week 1:  PT Short Term Goal 1 (Week 1): STGs=LTGs due to short ELOS  Skilled Therapeutic Interventions/Progress Updates: Pt standing at sink washing hands at start of tx, RN supervising.  Pt stated he took himself to BR.  PT discussed falls risk with pt; he stated he would call for help from now on.  Gait in room with RW to return to w/c.  PT donned shoes and socks.   W/c propulsion for activity tolerance x 150' x 2 with supervision.  PT replaced R legrest for longer one to match more comfortable L one.   Nu Step at level 4 x 10 minutes for bil LE flexibility.  PT instructed pt in edema management L lateral thigh for retrograde massage; pt reported decreased pain and muscle spasm after this.  Gait x 180' with RW, close superviison, cues for hand placement during stand> sit.  Gait over unlevel ground (floor mat) with min guard assist, without LOB or unsteadiness.  Pt propelled back to room and left resting in w/c.  PT gave him lotion to perform retrograde massage for L lateral edema pocket.  NT will refill ice packs for bil LEs; PT also gave pt one for L lateral thigh.    Therapy Documentation Precautions:  Precautions Precautions: Fall Restrictions Weight Bearing Restrictions: Yes RLE Weight Bearing: Weight bearing as tolerated LLE Weight Bearing: Weight bearing as tolerated   Pain: rated 8/10; pr stated he is trying to take less pain medicine.  PT addressed pain at end of session with ice, legrest positioning Pain Assessment Pain Assessment: 0-10 Pain Score: 8  Pain Location: Knee Pain Orientation: Right;Left Pain Intervention(s): ice; Elevated extremity      See Function Navigator for Current Functional Status.   Therapy/Group: Individual  Therapy  Ly Bacchi 03/16/2015, 5:13 PM

## 2015-03-16 NOTE — Progress Notes (Signed)
Occupational Therapy Session Note  Patient Details  Name: [name] MRN: [id number] Date of Birth: [birth year]  Today's Date: 03/16/2015 OT Individual Time: 1030-1200 OT Individual Time Calculation (min): 90 min    Short Term Goals: Week 1:  OT Short Term Goal 1 (Week 1): STGs=LTGs secondary to short estimated LOS  Skilled Therapeutic Interventions/Progress Updates:  Upon entering the room, pt seated in wheelchair awaiting therapist with 3/10 c/o pain in B knees this session. His wife present for observation. Skilled OT session with focus on self care retraining with use of AE and modifications, functional transfers, dynamic standing balance, sit <>stand, and pt/family education. Pt required min verbal cues for safety awareness to lock wheelchair prior to sit <>stand. Pt required lifting assistance to stand and steady assist for ambulation 10' into bathroom with RW. Pt performed stand pivot transfer with min verbal cues for technique. Pt placed feet on basin on shower floor for comfort while seated on elevated TTB in walk in shower. Pt performing bathing with use of long handled sponge in order to increase independence. Pt requiring steady assist to stand in shower while washing buttocks and peri area. Pt ambulated from bathroom to sit EOB to don clothing. Pt use of LH reacher and LH shoe horn in order to increase I with donning of equipment. Pt require frequent rest break secondary to fatigue and pain. Pt performed stand pivot transfer from bed>recliner chair with steady assist. B LEs elevated and ice applied. Call bell and all needed items within reach.   Therapy Documentation Precautions:  Precautions Precautions: Fall Required Braces or Orthoses:  (knee immobilizers d/c'd in acute care) Knee Immobilizer - Right: Discontinue once straight leg raise with < 10 degree lag Knee Immobilizer - Left: Discontinue once straight leg raise with < 10 degree lag Restrictions Weight Bearing  Restrictions: Yes RLE Weight Bearing: Weight bearing as tolerated LLE Weight Bearing: Weight bearing as tolerated  See Function Navigator for Current Functional Status.   Therapy/Group: Individual Therapy  Phineas Semen 03/16/2015, 12:51 PM

## 2015-03-16 NOTE — Progress Notes (Signed)
Physical Therapy Session Note  Patient Details  Name: [name] MRN: [id number] Date of Birth: May 24, 1950  Today's Date: 03/16/2015 PT Individual Time: 7425-9563 PT Individual Time Calculation (min): 68 min   Short Term Goals: Week 1:  PT Short Term Goal 1 (Week 1): STGs=LTGs due to short ELOS  Skilled Therapeutic Interventions/Progress Updates:   Pt received in w/c and agreeable to therapy session. Session focused on patient education, improving ROM, decreasing pain, safe functional transfers, and gait training.  PT also initiated family education with patient's wife, [name], on transfers, gait training, and kinesio-tape for reducing edema in bilat knees.  Pt propelled w/c x150' to therapy gym for UE strengthening and overall endurance. Pt performed stand pivot transfer with RW and supervision for safety with verbal cues for foot placement to avoid tripping on w/c casters.  Once positioned in supine on mat, pt performed bilateral knee extension stretch x5 minutes with pillows under ankles.  Pt also performed bilat ankle pumps and SAQ x10 with 4" bolster.  PT applied kinesio-tape to lateral and medial L knee and medial R knee in wave pattern to facilitate reduction in edema and pain.  Pt returned to sitting mod I, stood with RW with supervision, and amb x150' back to room with min assist at pelvis for balance and verbal cues for equal step length, knee flexion and hip stability during swing phase, and knee extension during stance phase.  Pt seated in recliner at end of session and PT provided pt and [name] education on AAROM x10 reps for knee flexion in recliner (wife demonstrates understanding on RLE after PT demonstrated on LLE).  Pt positioned to comfort with folded pillow under each ankle for knee extension stretch with call bell in reach and needs met.    Therapy Documentation Precautions:  Precautions Precautions: Fall Required Braces or Orthoses:  (knee immobilizers d/c'd in acute  care) Knee Immobilizer - Right: Discontinue once straight leg raise with < 10 degree lag Knee Immobilizer - Left: Discontinue once straight leg raise with < 10 degree lag Restrictions Weight Bearing Restrictions: Yes RLE Weight Bearing: Weight bearing as tolerated LLE Weight Bearing: Weight bearing as tolerated Pain: Pain Assessment Pain Assessment: 0-10 Pain Score: 6  Pain Location: Knee Pain Orientation: Right;Left Pain Intervention(s): Emotional support;Elevated extremity;Ambulation/increased activity   See Function Navigator for Current Functional Status.   Therapy/Group: Individual Therapy  [name] 03/16/2015, 5:05 PM

## 2015-03-16 NOTE — Progress Notes (Signed)
ANTICOAGULATION CONSULT NOTE - Follow Up Consult  Pharmacy Consult for Coumadin Indication: VTE prophylaxis  Allergies  Allergen Reactions  . Statins Other (See Comments)    Messes with liver function  . Codeine     REACTION: nausea  . Tape Rash    Patient Measurements: Height: 5' 10"  (177.8 cm) Weight: 226 lb 9.6 oz (102.785 kg) IBW/kg (Calculated) : 73 Heparin Dosing Weight:   Vital Signs: Temp: 98.2 F (36.8 C) (10/26 0558) Temp Source: Oral (10/26 0558) BP: 130/49 mmHg (10/26 0558) Pulse Rate: 72 (10/26 0558)  Labs:  Recent Labs  03/14/15 0521 03/14/15 0715 03/14/15 1833 03/15/15 0517 03/16/15 0538  HGB 8.3*  --  10.5*  --   --   HCT 24.8*  --  31.0*  --   --   PLT 189  --  210  --   --   LABPROT  --  21.0*  --  22.4* 24.8*  INR  --  1.81*  --  1.98* 2.27*  CREATININE  --   --  0.97  --   --     Estimated Creatinine Clearance: 96 mL/min (by C-G formula based on Cr of 0.97).   Medications:  Scheduled:  . docusate sodium  100 mg Oral BID  . enoxaparin (LOVENOX) injection  30 mg Subcutaneous Q12H  . iron polysaccharides  150 mg Oral Daily  . naloxegol oxalate  25 mg Oral Daily  . omega-3 acid ethyl esters  1 g Oral BID  . Warfarin - Pharmacist Dosing Inpatient   Does not apply q1800    Assessment: 64yo male s/p B-TKA on 10/19.  INR therapeutic today, at 2.27.  No bleeding noted.  Goal of Therapy:  INR 2-3 Monitor platelets by anticoagulation protocol: Yes   Plan:  Coumadin 70m today D/C Lovenox Watch for s/s of bleeding Coumadin x 4 weeks per Ortho, then resume ASA 810mdaily  KeGracy BruinsPharmD Clinical Pharmacist [facility]

## 2015-03-16 NOTE — IPOC Note (Addendum)
Overall Plan of Care [facility]) Patient Details Name: [name] MRN: [id number] DOB: 13-Sep-1950  Admitting Diagnosis: b tkr  Hospital Problems: Active Problems:   Status post bilateral knee replacements     Functional Problem List: Nursing Bowel, Edema, Medication Management, Endurance, Pain, Safety, Sensory, Skin Integrity, Motor  PT Balance, Endurance, Motor, Pain, Safety  OT Balance, Edema, Endurance, Motor, Safety, Pain  SLP    TR         Basic ADL's: OT Grooming, Bathing, Dressing, Toileting     Advanced  ADL's: OT  (n/a)     Transfers: PT Bed Mobility, Bed to Chair, Car, Manufacturing systems engineer, Other (comment) (shower is on 2nd level)     Locomotion: PT Ambulation, Stairs     Additional Impairments: OT None  SLP        TR      Anticipated Outcomes Item Anticipated Outcome  Self Feeding  n/a  Swallowing      Basic self-care  Mod I   Toileting  Mod I    Bathroom Transfers Mod I   Bowel/Bladder  Pt will remain continent of bowel and bladder with min assist   Transfers  mod I  Locomotion  mod I  Communication     Cognition     Pain  Pt will rate pain at 4 or less on a scale of 0-10  Safety/Judgment  Pt will remain free of falls and injury while in rehab with min assist    Therapy Plan: PT Intensity: Minimum of 1-2 x/day ,45 to 90 minutes PT Frequency: 5 out of 7 days PT Duration Estimated Length of Stay: 7-10 days OT Intensity: Minimum of 1-2 x/day, 45 to 90 minutes OT Frequency: 5 out of 7 days OT Duration/Estimated Length of Stay: 7-10 days         Team Interventions: Nursing Interventions Bowel Management, Patient/Family Education, Pain Management, Medication Management, Skin Care/Wound Management, Discharge Planning, Psychosocial Support  PT interventions Ambulation/gait training, Training and development officer, Discharge planning, DME/adaptive equipment instruction, Functional mobility training, Patient/family education, Neuromuscular  re-education, Pain management, Therapeutic Exercise, Therapeutic Activities, Stair training, UE/LE Strength taining/ROM, UE/LE Coordination activities  OT Interventions Balance/vestibular training, Community reintegration, Patient/family education, Self Care/advanced ADL retraining, Therapeutic Exercise, UE/LE Coordination activities, UE/LE Strength taining/ROM, Therapeutic Activities, Psychosocial support, Functional mobility training, DME/adaptive equipment instruction, Discharge planning, Cognitive remediation/compensation  SLP Interventions    TR Interventions    SW/CM Interventions Discharge Planning, Psychosocial Support, Patient/Family Education    Team Discharge Planning: Destination: PT-Home ,OT- Home , SLP-  Projected Follow-up: PT-Outpatient PT, OT-  Outpatient OT, SLP-  Projected Equipment Needs: PT-None recommended by PT, OT- Other (comment), SLP-  Equipment Details: PT-pt already has RW PTA, OT-pt has all recommended equipment Patient/family involved in discharge planning: PT- Patient, Family member/caregiver,  OT-Patient, SLP-   MD ELOS: 10-12 days Medical Rehab Prognosis:  Excellent Assessment: 64 y.o.right handedmale with history of MVP, mitral regurgitation, atrial flutter status post ablation maintained on aspirin therapy. 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 No relief with conservative care including NSAIDs and/or analgesics, corticosteroid injections ,activity modification. Underwent bilateral total knee arthroplasty 03/09/2015 per Dr. Maureen Ralphs. Hospital course pain management. Weightbearing as tolerated bilateral lower extremities.   See Team Conference Notes for weekly updates to the plan of care

## 2015-03-16 NOTE — Progress Notes (Signed)
Subjective/Complaints: Pt states his pain is under good control.  He had a good day in therapies yesterday and would like to work on knee flexion and sit/stand.  He also notes he had a BM yesterday.    ROS:  Denies CP, SOB, n/v/d.   Objective: Vital Signs: Blood pressure 130/49, pulse 72, temperature 98.2 F (36.8 C), temperature source Oral, resp. rate 18, height 5' 10"  (1.778 m), weight 102.785 kg (226 lb 9.6 oz), SpO2 100 %. No results found. Results for orders placed or performed during the hospital encounter of 03/14/15 (from the past 72 hour(s))  CBC WITH DIFFERENTIAL     Status: Abnormal   Collection Time: 03/14/15  6:33 PM  Result Value Ref Range   WBC 8.7 4.0 - 10.5 K/uL   RBC 3.47 (L) 4.22 - 5.81 MIL/uL   Hemoglobin 10.5 (L) 13.0 - 17.0 g/dL   HCT 31.0 (L) 39.0 - 52.0 %   MCV 89.3 78.0 - 100.0 fL   MCH 30.3 26.0 - 34.0 pg   MCHC 33.9 30.0 - 36.0 g/dL   RDW 12.8 11.5 - 15.5 %   Platelets 210 150 - 400 K/uL   Neutrophils Relative % 65 %   Neutro Abs 5.7 1.7 - 7.7 K/uL   Lymphocytes Relative 20 %   Lymphs Abs 1.7 0.7 - 4.0 K/uL   Monocytes Relative 11 %   Monocytes Absolute 1.0 0.1 - 1.0 K/uL   Eosinophils Relative 4 %   Eosinophils Absolute 0.3 0.0 - 0.7 K/uL   Basophils Relative 0 %   Basophils Absolute 0.0 0.0 - 0.1 K/uL  Comprehensive metabolic panel     Status: Abnormal   Collection Time: 03/14/15  6:33 PM  Result Value Ref Range   Sodium 131 (L) 135 - 145 mmol/L   Potassium 4.1 3.5 - 5.1 mmol/L   Chloride 97 (L) 101 - 111 mmol/L   CO2 28 22 - 32 mmol/L   Glucose, Bld 120 (H) 65 - 99 mg/dL   BUN 13 6 - 20 mg/dL   Creatinine, Ser 0.97 0.61 - 1.24 mg/dL   Calcium 8.0 (L) 8.9 - 10.3 mg/dL   Total Protein 5.6 (L) 6.5 - 8.1 g/dL   Albumin 2.5 (L) 3.5 - 5.0 g/dL   AST 93 (H) 15 - 41 U/L   ALT 90 (H) 17 - 63 U/L   Alkaline Phosphatase 86 38 - 126 U/L   Total Bilirubin 1.6 (H) 0.3 - 1.2 mg/dL   GFR calc non Af Amer >60 >60 mL/min   GFR calc Af Amer >60 >60  mL/min    Comment: (NOTE) The eGFR has been calculated using the CKD EPI equation. This calculation has not been validated in all clinical situations. eGFR's persistently <60 mL/min signify possible Chronic Kidney Disease.    Anion gap 6 5 - 15  Protime-INR     Status: Abnormal   Collection Time: 03/15/15  5:17 AM  Result Value Ref Range   Prothrombin Time 22.4 (H) 11.6 - 15.2 seconds   INR 1.98 (H) 0.00 - 1.49  Protime-INR     Status: Abnormal   Collection Time: 03/16/15  5:38 AM  Result Value Ref Range   Prothrombin Time 24.8 (H) 11.6 - 15.2 seconds   INR 2.27 (H) 0.00 - 1.49     Gen NAD. Vital signs reviewed.  HEENT: Normocephalic, atraumatic Cardio: RRR and no murmur Resp: CTA B/L and unlabored GI: BS positive and NT, ND Musc/Skel:  Tender b/l knees  Edema bilateral pretibial Strength: 4- bilateral HF (limitied due to pain), 5/5 distally Neuro: Alert/Oriented,  Skin:   Intact and Bruise bilateral pretib, Knee incisiton CDI   Assessment/Plan: 1. 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 Physiatrist is providing close team supervision and 24 hour management of active medical problems listed below. Physiatrist and rehab team continue to assess barriers to discharge/monitor patient progress toward functional and medical goals. FIM: Function - Bathing Position: Wheelchair/chair at sink Body parts bathed by patient: Right arm, Left arm, Chest, Abdomen, Front perineal area, Buttocks, Right upper leg, Left upper leg, Right lower leg, Left lower leg Body parts bathed by helper: Back Assist Level: Touching or steadying assistance(Pt > 75%)  Function- Upper Body Dressing/Undressing What is the patient wearing?: Pull over shirt/dress Pull over shirt/dress - Perfomed by patient: Thread/unthread right sleeve, Pull  shirt over trunk, Thread/unthread left sleeve, Put head through opening Assist Level: Set up Function - Lower Body Dressing/Undressing What is the patient wearing?: Socks, Shoes Position: Standing at sink Underwear - Performed by patient: Thread/unthread right underwear leg, Thread/unthread left underwear leg, Pull underwear up/down Pants- Performed by patient: Thread/unthread right pants leg, Thread/unthread left pants leg, Pull pants up/down, Fasten/unfasten pants Non-skid slipper socks- Performed by helper: Don/doff right sock, Don/doff left sock Socks - Performed by patient: Don/doff right sock, Don/doff left sock Shoes - Performed by patient: Don/doff right shoe, Don/doff left shoe Shoes - Performed by helper: Fasten right, Fasten left TED Hose - Performed by helper: Don/doff right TED hose, Don/doff left TED hose Assist for footwear: Partial/moderate assist Assist for lower body dressing: Touching or steadying assistance (Pt > 75%)  Function - Toileting Toileting steps completed by patient: Performs perineal hygiene, Adjust clothing after toileting, Adjust clothing prior to toileting Toileting Assistive Devices: Grab bar or rail Assist level: Touching or steadying assistance (Pt.75%)  Function - Air cabin crew transfer assistive device: Elevated toilet seat/BSC over toilet, Walker Assist level to toilet: Moderate assist (Pt 50 - 74%/lift or lower) Assist level from toilet: Moderate assist (Pt 50 - 74%/lift or lower)  Function - Chair/bed transfer Chair/bed transfer method: Ambulatory Chair/bed transfer assist level: Touching or steadying assistance (Pt > 75%) Chair/bed transfer assistive device: Walker, Armrests Chair/bed transfer details: Verbal cues for technique, Verbal cues for sequencing, Verbal cues for safe use of DME/AE, Verbal cues for precautions/safety  Function - Locomotion: Wheelchair Type: Manual Max wheelchair distance: 100 Assist Level: Supervision or  verbal cues Assist Level: Supervision or verbal cues Assist Level: Supervision or verbal cues Turns around,maneuvers to table,bed, and toilet,negotiates 3% grade,maneuvers on rugs and over doorsills: No Function - Locomotion: Ambulation Assistive device: Walker-rolling Max distance: 100 Assist level: Touching or steadying assistance (Pt > 75%) Assist level: Touching or steadying assistance (Pt > 75%) Assist level: Touching or steadying assistance (Pt > 75%)  Function - Comprehension Comprehension: Auditory Comprehension assist level: Follows complex conversation/direction with no assist  Function - Expression Expression: Verbal Expression assist level: Expresses complex ideas: With no assist  Function - Social Interaction Social Interaction assist level: Interacts appropriately with others - No medications needed.  Function - Problem Solving Problem solving assist level: Solves complex problems: With extra time  Function - Memory Memory assist level: Complete Independence: No helper Patient normally able to recall (first 3 days only): Current season, Location of own room, Staff names and faces, That he or  she is in a hospital  Medical Problem List and Plan: 1. 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  DVT Prophylaxis/Anticoagulation: Coumadin therapy. Monitor for any bleeding episodes. Check vascular study 3. Pain Management: Robaxin and Ultram as needed. Monitor with increased mobility 4. Constipation. Laxative assistance. Pt had BM yesterday.  5. Neuropsych: This patient is capable of making decisions on his own behalf. 6. Skin/Wound Care: Routine skin checks 7. Fluids/Electrolytes/Nutrition: Routine I&O . Follow-up chemistries 8.Mitral valve prolapse/ atrial flutter status post ablation. Resume aspirin after Coumadin completed 9. Hyperlipidemia.Lovaza 10.ABLA. Continue iron supplement. Follow-up CBC, hgb 10.5  on 10/24. 11. Hyponatremia: 131 on 10/24. Will cont to monitor and replace if necessary.  LOS (Days) 2 A FACE TO FACE EVALUATION WAS PERFORMED  [name] 03/16/2015, 8:26 AM

## 2015-03-16 NOTE — Care Management Note (Signed)
[facility] Individual Statement of Services  Patient Name:  [name]  Date:  03/16/2015  Welcome to the [facility].  Our goal is to provide you with an individualized program based on your diagnosis and situation, designed to meet your specific needs.  With this comprehensive rehabilitation program, you will be expected to participate in at least 3 hours of rehabilitation therapies Monday-Friday, with modified therapy programming on the weekends.  Your rehabilitation program will include the following services:  Physical Therapy (PT), Occupational Therapy (OT), 24 hour per day rehabilitation nursing, Case Management (Social Worker), Rehabilitation Medicine, Nutrition Services and Pharmacy Services  Weekly team conferences will be held on Tuesdays to discuss your progress.  Your Social Worker will talk with you frequently to get your input and to update you on team discussions.  Team conferences with you and your family in attendance may also be held.  Expected length of stay: 7-10 days  Overall anticipated outcome: modified independent  Depending on your progress and recovery, your program may change. Your Social Worker will coordinate services and will keep you informed of any changes. Your Social Worker's name and contact numbers are listed  below.  The following services may also be recommended but are not provided by the [facility] will be made to provide these services after discharge if needed.  Arrangements include referral to agencies that provide these services.  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  Social Worker:  [place], [place] or ([phone]   Information discussed with and copy given to patient by: Lennart Pall, 03/16/2015, 3:54 PM

## 2015-03-17 ENCOUNTER — Inpatient Hospital Stay (HOSPITAL_COMMUNITY): Payer: Managed Care, Other (non HMO) | Admitting: Physical Therapy

## 2015-03-17 ENCOUNTER — Inpatient Hospital Stay (HOSPITAL_COMMUNITY): Payer: Managed Care, Other (non HMO) | Admitting: Occupational Therapy

## 2015-03-17 DIAGNOSIS — E871 Hypo-osmolality and hyponatremia: Secondary | ICD-10-CM

## 2015-03-17 DIAGNOSIS — R600 Localized edema: Secondary | ICD-10-CM

## 2015-03-17 LAB — PROTIME-INR
INR: 2.53 — ABNORMAL HIGH (ref 0.00–1.49)
Prothrombin Time: 26.9 seconds — ABNORMAL HIGH (ref 11.6–15.2)

## 2015-03-17 MED ORDER — FUROSEMIDE 20 MG PO TABS
20.0000 mg | ORAL_TABLET | Freq: Every day | ORAL | Status: AC
  Administered 2015-03-17 – 2015-03-18 (×2): 20 mg via ORAL
  Filled 2015-03-17 (×2): qty 1

## 2015-03-17 MED ORDER — WARFARIN SODIUM 5 MG PO TABS
5.0000 mg | ORAL_TABLET | Freq: Once | ORAL | Status: AC
Start: 2015-03-17 — End: 2015-03-17
  Administered 2015-03-17: 5 mg via ORAL
  Filled 2015-03-17: qty 1

## 2015-03-17 NOTE — Progress Notes (Signed)
Occupational Therapy Session Note  Patient Details  Name: [name] MRN: [id number] Date of Birth: 01-01-1951  Today's Date: 03/17/2015 OT Individual Time: [phone] OT Individual Time Calculation (min): 89 min    Short Term Goals: Week 1:  OT Short Term Goal 1 (Week 1): STGs=LTGs secondary to short estimated LOS  Skilled Therapeutic Interventions/Progress Updates:  Upon entering the room, pt supine in bed with 4/10 c/o pain this session. Pt utilizing leg lifter in order to increase I with supine >sit on EOB. Pt performed sit <>stand with steady assist and use of RW. Pt ambulating short distance of 10' into bathroom for bathing while seated on TTB. Pt utilizing [place] reacher for LB clothing management along with use of LH sponge for bathing in order to increase I with min verbal cues for proper technique. After bathing, pt dressed self from EOB with steady assist for balance. Pt transferring into wheelchair with min A for stand pivot with RW. OT led pt in 3 sets of 10 wheelchair push ups in order to increase B UE strengthening for functional transfers. Pt propelling wheelchair 60' to ADL apartment for practice with functional transfer on/off of bed with steady assist from low standard bed surface with use of RW. OT also educated caregiver on transfer in and out of TTB as this is what pt will use at home once he is able to climb flight of stairs for bedroom and full bath. Pt returned to room with call bell and all needed items within reach upon exiting the room.   Therapy Documentation Precautions:  Precautions Precautions: Fall Required Braces or Orthoses:  (knee immobilizers d/c'd in acute care) Knee Immobilizer - Right: Discontinue once straight leg raise with < 10 degree lag Knee Immobilizer - Left: Discontinue once straight leg raise with < 10 degree lag Restrictions Weight Bearing Restrictions: Yes RLE Weight Bearing: Weight bearing as tolerated LLE Weight Bearing: Weight bearing as  tolerated   Pain: Pain Assessment Pain Assessment: 0-10 Pain Score: 4  Pain Location: Knee Pain Orientation: Right;Left Pain Intervention(s): Emotional support;Relaxation;Ambulation/increased activity  See Function Navigator for Current Functional Status.   Therapy/Group: Individual Therapy  Phineas Semen 03/17/2015, 12:38 PM

## 2015-03-17 NOTE — Progress Notes (Signed)
Physical Therapy Session Note  Patient Details  Name: [name] MRN: [id number] Date of Birth: 1950-07-13  Today's Date: 03/17/2015 PT Individual Time: 1059-1200 and 1500-1602 PT Individual Time Calculation (min): 61 min and 62 min   Short Term Goals: Week 1:  PT Short Term Goal 1 (Week 1): STGs=LTGs due to short ELOS  Skilled Therapeutic Interventions/Progress Updates:   Session 1: Pt received handoff from OT in therapy gym.  Session focused on gait training, instruction in stair negotiation, increasing ROM in bilateral knees, HEP, activity tolerance, and patient/family education.  PT instructed patient in negotiation of 4 steps with 2 hand rails and min assist with verbal cues for ascend with RLE and descend with LLE, hand placement, and posture.  Pt amb x20' to therapy mat and PT instructed patient in BLE therex in sitting x15 reps for hip flexion, LAQ, ball squeezes, and hip abd with level IV theraband, and x10 reps of hamstring curls with level IV theraband.  Pt performed 12 minutes on Nustep to increase knee ROM and overall endurance.  Pt amb back to room x150' with close supervision and verbal cues for knee flexion during swing phase, upright posture, relaxed shoulders, decreasing hip hike, and walker positioning.  Pt positioned in recliner at end of session with ice packs applied to knees with call bell in reach and needs met    Session 2: Pt received sitting in recliner and agreeable to therapy session.  Session focused on gait training with RW, endurance, LE HEP, ankle strengthening for improved stability with gait, and bilat knee ROM.  Pt amb x150' to therapy gym with RW and supervision requiring verbal cues for knee flexion during swing phase, upright posture, relaxed shoulders, and reduced hip hike on RLE during swing phase.  PT instructed patient in BLE therex x10 reps for heel/toe raises, ankle inversion/eversion, and ankle alphabet (1/2 the alphabet on each foot), and x15 supine  heel slides, SLR, SAQ, hip abd/add to midline, and 5 min knee extension stretch.  Pt amb back to room at end of session with supervision and same cues as above, though noted pt required decreased frequency of cues on return to room.  Pt positioned to comfort with call bell in reach and needs met.  NT notified that pt would like ice for knees.   Therapy Documentation Precautions:  Precautions Precautions: Fall Required Braces or Orthoses:  (knee immobilizers d/c'd in acute care) Knee Immobilizer - Right: Discontinue once straight leg raise with < 10 degree lag Knee Immobilizer - Left: Discontinue once straight leg raise with < 10 degree lag Restrictions Weight Bearing Restrictions: Yes RLE Weight Bearing: Weight bearing as tolerated LLE Weight Bearing: Weight bearing as tolerated Pain: Pain Assessment Pain Assessment: 0-10 Pain Score: 4  Pain Location: Knee Pain Orientation: Right;Left Pain Intervention(s): Emotional support;Relaxation;Ambulation/increased activity   See Function Navigator for Current Functional Status.   Therapy/Group: Individual Therapy  Perlita Forbush E Penven-Crew 03/17/2015, 12:30 PM

## 2015-03-17 NOTE — Progress Notes (Signed)
Social Work  Social Work Assessment and Plan  Patient Details  Name: [name] MRN: [id number] Date of Birth: [birth year]  Today's Date: 03/17/2015  Problem List:  Patient Active Problem List   Diagnosis Date Noted  . Status post bilateral knee replacements 03/14/2015  . OA (osteoarthritis) of knee 03/09/2015  . Pre-op examination 09/17/2014  . S/P mitral valve repair 03/18/2013  . Obesity (BMI 30-39.9) 02/24/2013  . HAND PAIN, RIGHT 06/05/2010  . CAD 12/28/2009  . CAROTID STENOSIS 12/28/2009  . CEREBROVASCULAR DISEASE 12/28/2009  . PRURITUS ANI 05/26/2009  . HYPERLIPIDEMIA, FAMILIAL 04/26/2009  . DEGENERATIVE JOINT DISEASE, KNEES, BILATERAL 09/27/2008  . FATTY LIVER DISEASE 12/29/2007  . ABNORMAL TRANSAMINASE-LFT'S 12/29/2007  . COLONIC POLYPS, ADENOMATOUS, HX OF 12/25/2007  . 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 FEVER, HX OF 12/22/2007  . MITRAL REGURGITATION, SEVERE 03/19/2007  . HEMOCCULT POSITIVE STOOL 02/03/2007  . SYSTOLIC MURMUR 00/34/9179   Past Medical History:  Past Medical History  Diagnosis Date  . MVP (mitral valve prolapse)   . MR (mitral regurgitation)     severe; mitral valve repair in march 2009  . Atrial flutter ([place])     s/p ablation  . Hyperlipidemia   . CAD (coronary artery disease)     cath 06/2007 40% LM  . CVD (cerebrovascular disease)     59% right ICA, 49% left ICA; h/oTIA; fu study 8/11 with normal carotids  . History of colonoscopy   . Osteoarthritis     both knees  . Dysrhythmia   . Heart murmur   . Shortness of breath dyspnea     increased exertion   . History of bronchitis   . Gait abnormality   . Horseshoe kidney    Past Surgical History:  Past Surgical History  Procedure Laterality Date  . Tonsillectomy  1960  . Mitral valve repair  06/2007  . Heart ablation    . Cardiac catheterization    . Back surgery      secondary to ruptured disc  . Total knee arthroplasty Bilateral 03/09/2015    Procedure: TOTAL KNEE BILATERAL;   Surgeon: Gaynelle Arabian, MD;  Location: [facility];  Service: Orthopedics;  Laterality: Bilateral;   Social History:  reports that he has never smoked. He has never used smokeless tobacco. He reports that he drinks alcohol. He reports that he does not use illicit drugs.  Family / Support Systems Marital Status: Married Patient Roles: Spouse, Parent Spouse/Significant Other: wife, [name] @ (H) [phone] or (C) [phone] Children: adult children with one son living in the home and working Anticipated Caregiver: wife Ability/Limitations of Caregiver: Wife stays at home and can assist. Caregiver Availability: 24/7 Family Dynamics: Wfie very supportive, attentive and ready to provide as much assist as needed upon d/c.  Notes she has "blocked out through January to be available for whatever her needs."  Social History Preferred language: English Religion: Non-Denominational Cultural Background: NA Education: college Read: Yes Write: Yes Employment Status: Employed Return to Work Plans: Pt fully plans to return to work when medically cleared.   Legal Hisotry/Current Legal Issues: None Guardian/Conservator: None - per MD, pt capable of making decisions on his own behalf.   Abuse/Neglect Physical Abuse: Denies Verbal Abuse: Denies Sexual Abuse: Denies Exploitation of patient/patient's resources: Denies Self-Neglect: Denies  Emotional Status Pt's affect, behavior adn adjustment status: Pt pleasant and very talkative.  Admits to feeling a "little loopy" from pain meds and humorous throughout.  At times, he does begin to lament  about frustrations with her jobs, general frustrations with public.  He denies any s/s of any emotional distress.  Formal depression screen not indicated at this time. Recent Psychosocial Issues: None Pyschiatric History: None Substance Abuse History: None  Patient / Family Perceptions, Expectations & Goals Pt/Family understanding of illness & functional  limitations: pt and wife with very good understanding of the surgery performed and current functional limitations/ need for CIR. Premorbid pt/family roles/activities: Pt was independent, however, "in pain all the time....this is the first time my legs have been straight in a long time..." Anticipated changes in roles/activities/participation: little change in roles as pt has mod i goals.  Wife has been providing assistance PTA. Pt/family expectations/goals: "I am just looking forward to not being in the pain that I was..."  [employer]: None Premorbid Home Care/DME Agencies: None Transportation available at discharge: yes  Discharge Planning Living Arrangements: Spouse/significant other, Children Support Systems: Spouse/significant other, Children Type of Residence: Private residence Administrator, sports: Multimedia programmer (specify) Scientist, clinical (histocompatibility and immunogenetics)) Financial Resources: Employment Museum/gallery curator Screen Referred: No Living Expenses: Higher education careers adviser Management: Patient Does the patient have any problems obtaining your medications?: No Home Management: mostly wife Patient/Family Preliminary Plans: Pt to return home with wife who can provide 24/7 assistance Social Work Anticipated Follow Up Needs: HH/OP Expected length of stay: 7-10 days  Clinical Impression Pleasant, talkative gentleman here following bil TKR.  Excellent support available at home from wife who can provide 24/7 assist.  Denies any s/s of any emotional distress.  Will follow for d/c planning needs.   [name] 03/17/2015, 3:42 PM

## 2015-03-17 NOTE — Progress Notes (Signed)
ANTICOAGULATION CONSULT NOTE - Follow Up Consult  Pharmacy Consult for coumadin Indication: VTE prophylaxis  Allergies  Allergen Reactions  . Statins Other (See Comments)    Messes with liver function  . Codeine     REACTION: nausea  . Tape Rash    Patient Measurements: Height: 5' 10"  (177.8 cm) Weight: 236 lb 3.2 oz (107.14 kg) IBW/kg (Calculated) : 73 Heparin Dosing Weight:   Vital Signs: Temp: 98.1 F (36.7 C) (10/27 0525) Temp Source: Oral (10/27 0525) BP: 112/56 mmHg (10/27 0525) Pulse Rate: 66 (10/27 0525)  Labs:  Recent Labs  03/14/15 1833 03/15/15 0517 03/16/15 0538 03/17/15 0521  HGB 10.5*  --   --   --   HCT 31.0*  --   --   --   PLT 210  --   --   --   LABPROT  --  22.4* 24.8* 26.9*  INR  --  1.98* 2.27* 2.53*  CREATININE 0.97  --   --   --     Estimated Creatinine Clearance: 98 mL/min (by C-G formula based on Cr of 0.97).   Medications:  Scheduled:  . docusate sodium  100 mg Oral BID  . iron polysaccharides  150 mg Oral Daily  . naloxegol oxalate  25 mg Oral Daily  . omega-3 acid ethyl esters  1 g Oral BID  . Warfarin - Pharmacist Dosing Inpatient   Does not apply q1800   Infusions:    Assessment: 64 yo male s/p ortho surgery is currently on therapeutic coumadin for VTE prophylaxis.  INR today is 2.53  Goal of Therapy:  INR 2-3 Monitor platelets by anticoagulation protocol: Yes   Plan:  Repeat Coumadin 47m today Daily PT/INR.  Yurianna Tusing, Tsz-Yin 03/17/2015,8:16 AM

## 2015-03-17 NOTE — Progress Notes (Signed)
Subjective/Complaints: Pt states his pain is under good control.  Concerned about swelling in both knees. Has a difficult time bending knees due to swelling. Feels that he's making progress. Also wanted me to look at rash on back    ROS:  Denies CP, SOB, n/v/d.   Objective: Vital Signs: Blood pressure 112/56, pulse 66, temperature 98.1 F (36.7 C), temperature source Oral, resp. rate 16, height 5' 10"  (1.778 m), weight 107.14 kg (236 lb 3.2 oz), SpO2 95 %. No results found. Results for orders placed or performed during the hospital encounter of 03/14/15 (from the past 72 hour(s))  CBC WITH DIFFERENTIAL     Status: Abnormal   Collection Time: 03/14/15  6:33 PM  Result Value Ref Range   WBC 8.7 4.0 - 10.5 K/uL   RBC 3.47 (L) 4.22 - 5.81 MIL/uL   Hemoglobin 10.5 (L) 13.0 - 17.0 g/dL   HCT 31.0 (L) 39.0 - 52.0 %   MCV 89.3 78.0 - 100.0 fL   MCH 30.3 26.0 - 34.0 pg   MCHC 33.9 30.0 - 36.0 g/dL   RDW 12.8 11.5 - 15.5 %   Platelets 210 150 - 400 K/uL   Neutrophils Relative % 65 %   Neutro Abs 5.7 1.7 - 7.7 K/uL   Lymphocytes Relative 20 %   Lymphs Abs 1.7 0.7 - 4.0 K/uL   Monocytes Relative 11 %   Monocytes Absolute 1.0 0.1 - 1.0 K/uL   Eosinophils Relative 4 %   Eosinophils Absolute 0.3 0.0 - 0.7 K/uL   Basophils Relative 0 %   Basophils Absolute 0.0 0.0 - 0.1 K/uL  Comprehensive metabolic panel     Status: Abnormal   Collection Time: 03/14/15  6:33 PM  Result Value Ref Range   Sodium 131 (L) 135 - 145 mmol/L   Potassium 4.1 3.5 - 5.1 mmol/L   Chloride 97 (L) 101 - 111 mmol/L   CO2 28 22 - 32 mmol/L   Glucose, Bld 120 (H) 65 - 99 mg/dL   BUN 13 6 - 20 mg/dL   Creatinine, Ser 0.97 0.61 - 1.24 mg/dL   Calcium 8.0 (L) 8.9 - 10.3 mg/dL   Total Protein 5.6 (L) 6.5 - 8.1 g/dL   Albumin 2.5 (L) 3.5 - 5.0 g/dL   AST 93 (H) 15 - 41 U/L   ALT 90 (H) 17 - 63 U/L   Alkaline Phosphatase 86 38 - 126 U/L   Total Bilirubin 1.6 (H) 0.3 - 1.2 mg/dL   GFR calc non Af Amer >60 >60 mL/min   GFR calc Af Amer >60 >60 mL/min    Comment: (NOTE) The eGFR has been calculated using the CKD EPI equation. This calculation has not been validated in all clinical situations. eGFR's persistently <60 mL/min signify possible Chronic Kidney Disease.    Anion gap 6 5 - 15  Protime-INR     Status: Abnormal   Collection Time: 03/15/15  5:17 AM  Result Value Ref Range   Prothrombin Time 22.4 (H) 11.6 - 15.2 seconds   INR 1.98 (H) 0.00 - 1.49  Protime-INR     Status: Abnormal   Collection Time: 03/16/15  5:38 AM  Result Value Ref Range   Prothrombin Time 24.8 (H) 11.6 - 15.2 seconds   INR 2.27 (H) 0.00 - 1.49  Protime-INR     Status: Abnormal   Collection Time: 03/17/15  5:21 AM  Result Value Ref Range   Prothrombin Time 26.9 (H) 11.6 - 15.2 seconds  INR 2.53 (H) 0.00 - 1.49     Gen NAD. Vital signs reviewed.  HEENT: Normocephalic, atraumatic Cardio: RRR and no murmur Resp: CTA B/L and unlabored GI: BS positive and NT, ND Musc/Skel:  Tender b/l knees Edema throughout LE---extensive bruising from thighs to feet bilaterally.  Strength: 4- to 4 bilateral HF (limitied due to pain), 5/5 distally at ankles Neuro: Alert/Oriented,  Skin:   Drying maculopapular rash on back.  Knee incisiton CDI   Assessment/Plan: 1. 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 Physiatrist is providing close team supervision and 24 hour management of active medical problems listed below. Physiatrist and rehab team continue to assess barriers to discharge/monitor patient progress toward functional and medical goals. FIM: Function - Bathing Position: Shower Body parts bathed by patient: Right arm, Left arm, Chest, Abdomen, Front perineal area, Buttocks, Right upper leg, Left upper leg, Right lower leg, Left lower leg Body parts bathed by helper:  Back Assist Level: Touching or steadying assistance(Pt > 75%)  Function- Upper Body Dressing/Undressing What is the patient wearing?: Pull over shirt/dress Pull over shirt/dress - Perfomed by patient: Thread/unthread right sleeve, Pull shirt over trunk, Thread/unthread left sleeve, Put head through opening Assist Level: Set up Function - Lower Body Dressing/Undressing What is the patient wearing?: Shoes, Ted Hose Position: Sitting EOB Underwear - Performed by patient: Thread/unthread right underwear leg, Thread/unthread left underwear leg, Pull underwear up/down Pants- Performed by patient: Thread/unthread right pants leg, Thread/unthread left pants leg, Pull pants up/down, Fasten/unfasten pants Non-skid slipper socks- Performed by helper: Don/doff right sock, Don/doff left sock Socks - Performed by patient: Don/doff right sock, Don/doff left sock Shoes - Performed by patient: Don/doff right shoe, Don/doff left shoe Shoes - Performed by helper: Fasten right, Fasten left TED Hose - Performed by helper: Don/doff right TED hose, Don/doff left TED hose Assist for footwear: Setup Assist for lower body dressing: Touching or steadying assistance (Pt > 75%)  Function - Toileting Toileting steps completed by patient: Performs perineal hygiene, Adjust clothing after toileting, Adjust clothing prior to toileting Toileting Assistive Devices: Grab bar or rail Assist level: Touching or steadying assistance (Pt.75%)  Function - Air cabin crew transfer assistive device: Elevated toilet seat/BSC over toilet, Walker Assist level to toilet: Moderate assist (Pt 50 - 74%/lift or lower) Assist level from toilet: Moderate assist (Pt 50 - 74%/lift or lower)  Function - Chair/bed transfer Chair/bed transfer method: Ambulatory Chair/bed transfer assist level: Touching or steadying assistance (Pt > 75%) Chair/bed transfer assistive device: Walker Chair/bed transfer details: Verbal cues for technique,  Verbal cues for sequencing, Verbal cues for safe use of DME/AE, Verbal cues for precautions/safety  Function - Locomotion: Wheelchair Type: Manual Max wheelchair distance: 150 Assist Level: No help, No cues, assistive device, takes more than reasonable amount of time Assist Level: No help, No cues, assistive device, takes more than reasonable amount of time Assist Level: No help, No cues, assistive device, takes more than reasonable amount of time Turns around,maneuvers to table,bed, and toilet,negotiates 3% grade,maneuvers on rugs and over doorsills: Yes Function - Locomotion: Ambulation Assistive device: Walker-rolling Max distance: 150 Assist level: Touching or steadying assistance (Pt > 75%) Assist level: Touching or steadying assistance (Pt > 75%) Assist level: Touching or steadying assistance (Pt > 75%) Assist level: Touching or steadying assistance (Pt > 75%)  Function - Comprehension Comprehension: Auditory Comprehension assist level: Follows complex  conversation/direction with no assist  Function - Expression Expression: Verbal Expression assist level: Expresses complex ideas: With no assist  Function - Social Interaction Social Interaction assist level: Interacts appropriately with others - No medications needed.  Function - Problem Solving Problem solving assist level: Solves complex problems: With extra time  Function - Memory Memory assist level: Complete Independence: No helper Patient normally able to recall (first 3 days only): Current season, Location of own room, Staff names and faces, That he or she is in a hospital  Medical Problem List and Plan: 1. 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  DVT Prophylaxis/Anticoagulation: Coumadin therapy. Monitor for any bleeding episodes. Check vascular study 3. Pain Management: Robaxin and Ultram as needed. Monitor with increased mobility 4. Constipation.  Laxative assistance. Pt had BM    5. Neuropsych: This patient is capable of making decisions on his own behalf. 6. Skin/Wound Care: Routine skin checks  -will administer lasix 35m po x 2 to assist with LE edema  -aggressive ice, elevation, k-tape also  -keep back dry, appears to be heat rash--can use fungal powder--appears to be resolving. 7. Fluids/Electrolytes/Nutrition: Routine I&O . Follow-up chemistries as below 8.Mitral valve prolapse/ atrial flutter status post ablation. Resume aspirin after Coumadin completed 9. Hyperlipidemia.Lovaza 10.ABLA. Continue iron supplement. Follow-up CBC, hgb 10.5 on 10/24---recheck tomorrow given excessive bruising 11. Hyponatremia: 131 on 10/24. Will recheck on friday  LOS (Days) 3 A FACE TO FACE EVALUATION WAS PERFORMED  Mishawn Hemann T 03/17/2015, 9:48 AM

## 2015-03-18 ENCOUNTER — Inpatient Hospital Stay (HOSPITAL_COMMUNITY): Payer: Managed Care, Other (non HMO) | Admitting: Physical Therapy

## 2015-03-18 ENCOUNTER — Inpatient Hospital Stay (HOSPITAL_COMMUNITY): Payer: Managed Care, Other (non HMO) | Admitting: Occupational Therapy

## 2015-03-18 LAB — BASIC METABOLIC PANEL
ANION GAP: 11 (ref 5–15)
BUN: 12 mg/dL (ref 6–20)
CHLORIDE: 98 mmol/L — AB (ref 101–111)
CO2: 25 mmol/L (ref 22–32)
Calcium: 8.4 mg/dL — ABNORMAL LOW (ref 8.9–10.3)
Creatinine, Ser: 0.8 mg/dL (ref 0.61–1.24)
GFR calc Af Amer: >60 mL/min (ref >60)
Glucose, Bld: 114 mg/dL — ABNORMAL HIGH (ref 65–99)
POTASSIUM: 4 mmol/L (ref 3.5–5.1)
SODIUM: 134 mmol/L — AB (ref 135–145)

## 2015-03-18 LAB — PROTIME-INR
INR: 2.34 — ABNORMAL HIGH (ref 0.00–1.49)
Prothrombin Time: 25.4 seconds — ABNORMAL HIGH (ref 11.6–15.2)

## 2015-03-18 LAB — HEMOGLOBIN AND HEMATOCRIT, BLOOD
HEMATOCRIT: 24.7 % — AB (ref 39.0–52.0)
Hemoglobin: 8.1 g/dL — ABNORMAL LOW (ref 13.0–17.0)

## 2015-03-18 MED ORDER — CAMPHOR-MENTHOL 0.5-0.5 % EX LOTN
TOPICAL_LOTION | CUTANEOUS | Status: DC | PRN
  Administered 2015-03-18 – 2015-03-19 (×4): via TOPICAL
  Filled 2015-03-18: qty 222

## 2015-03-18 MED ORDER — WARFARIN SODIUM 5 MG PO TABS
5.0000 mg | ORAL_TABLET | Freq: Once | ORAL | Status: AC
  Administered 2015-03-18: 5 mg via ORAL
  Filled 2015-03-18: qty 1

## 2015-03-18 NOTE — Progress Notes (Signed)
Physical Therapy Session Note  Patient Details  Name: [name] MRN: [id number] Date of Birth: 03-06-1951  Today's Date: 03/18/2015 PT Individual Time: 1130-1200 and 1405-1505 PT Individual Time Calculation (min): 30 min and 60 min   Short Term Goals: Week 1:  PT Short Term Goal 1 (Week 1): STGs=LTGs due to short ELOS  Skilled Therapeutic Interventions/Progress Updates:   Session 1: Pt received sitting in w/c and agreeable to therapy session.  Session focused on gait training with RW, UE strengthening, and overall endurance.  PT instructed patient in ambulation 200' x2 trials with rest break in between using RW with supervision and verbal cues for posture and step length.  Pt performed 10 min on UBE at level 7.5 for UE strength and overall endurance.  Pt returned to room at end of session, hand off to NT who was present setting up lunch tray.    Session 2: Pt received sitting in w/c and agreeable to therapy session.  Session focused on edema management and gait training to normalize gait pattern.  Pt amb to therapy gym with RW and supervision.  Pt continues to require occasional cues for shoulder tension, upright posture, increased knee/hip flexion, and step length though is demonstrating improvements in each session in gait pattern.  PT removed k-tape with noted decrease in edema and bruising in bilat knees. PT applied new k-tape in wave pattern to reduce edema in bruising on medial and lateral side of bilateral knees.  Pt amb back to room demonstrating improved gait with step through pattern, improved knee/hip flexion, and upright posture, with verbal cues needed for decreasing shoulder tension. Pt positioned in recliner with ice applied to bilat knees, call bell in reach and needs met.   Therapy Documentation Precautions:  Precautions Precautions: Fall Required Braces or Orthoses:  (knee immobilizers d/c'd in acute care) Knee Immobilizer - Right: Discontinue once straight leg raise  with < 10 degree lag Knee Immobilizer - Left: Discontinue once straight leg raise with < 10 degree lag Restrictions Weight Bearing Restrictions: Yes RLE Weight Bearing: Weight bearing as tolerated LLE Weight Bearing: Weight bearing as tolerated Pain: Pain Assessment Pain Assessment: 0-10 Pain Score: 4  Pain Type: Surgical pain Pain Location: Knee Pain Orientation: Right;Left Pain Descriptors / Indicators: Aching Pain Frequency: Intermittent Pain Onset: Gradual Patients Stated Pain Goal: 1 Pain Intervention(s): Emotional support;Cold applied   See Function Navigator for Current Functional Status.   Therapy/Group: Individual Therapy  [name] 03/18/2015, 12:16 PM

## 2015-03-18 NOTE — Progress Notes (Signed)
Subjective/Complaints: Feels that swelling better. Had a good night. Back is itching quite a bit---doing a lot of scratching. Has questions about bowel medications.   ROS:  Denies CP, SOB, n/v/d.   Objective: Vital Signs: Blood pressure 129/63, pulse 68, temperature 97.7 F (36.5 C), temperature source Oral, resp. rate 18, height 5' 10"  (1.778 m), weight 107.14 kg (236 lb 3.2 oz), SpO2 97 %. No results found. Results for orders placed or performed during the hospital encounter of 03/14/15 (from the past 72 hour(s))  Protime-INR     Status: Abnormal   Collection Time: 03/16/15  5:38 AM  Result Value Ref Range   Prothrombin Time 24.8 (H) 11.6 - 15.2 seconds   INR 2.27 (H) 0.00 - 1.49  Protime-INR     Status: Abnormal   Collection Time: 03/17/15  5:21 AM  Result Value Ref Range   Prothrombin Time 26.9 (H) 11.6 - 15.2 seconds   INR 2.53 (H) 0.00 - 1.49  Protime-INR     Status: Abnormal   Collection Time: 03/18/15  5:28 AM  Result Value Ref Range   Prothrombin Time 25.4 (H) 11.6 - 15.2 seconds   INR 2.34 (H) 0.00 - 1.49  Hemoglobin and hematocrit, blood     Status: Abnormal   Collection Time: 03/18/15  5:28 AM  Result Value Ref Range   Hemoglobin 8.1 (L) 13.0 - 17.0 g/dL   HCT 24.7 (L) 39.0 - 15.4 %  Basic metabolic panel     Status: Abnormal   Collection Time: 03/18/15  5:28 AM  Result Value Ref Range   Sodium 134 (L) 135 - 145 mmol/L   Potassium 4.0 3.5 - 5.1 mmol/L   Chloride 98 (L) 101 - 111 mmol/L   CO2 25 22 - 32 mmol/L   Glucose, Bld 114 (H) 65 - 99 mg/dL   BUN 12 6 - 20 mg/dL   Creatinine, Ser 0.80 0.61 - 1.24 mg/dL   Calcium 8.4 (L) 8.9 - 10.3 mg/dL   GFR calc non Af Amer >60 >60 mL/min   GFR calc Af Amer >60 >60 mL/min    Comment: (NOTE) The eGFR has been calculated using the CKD EPI equation. This calculation has not been validated in all clinical situations. eGFR's persistently <60 mL/min signify possible Chronic Kidney Disease.    Anion gap 11 5 - 15      Gen NAD. Vital signs reviewed.  HEENT: Normocephalic, atraumatic Cardio: RRR and no murmur Resp: CTA B/L and unlabored GI: BS positive and NT, ND Musc/Skel:  Tender b/l knees Edema throughout LE with improving---not as tight---still with extensive bruising from thighs to feet bilaterally.  Strength: 4- to 4 bilateral HF (limitied due to pain), 5/5 distally at ankles Neuro: Alert/Oriented,  Skin:   Drying maculopapular rash on back.  Knee incisiton CDI   Assessment/Plan: 1. 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 Physiatrist is providing close team supervision and 24 hour management of active medical problems listed below. Physiatrist and rehab team continue to assess barriers to discharge/monitor patient progress toward functional and medical goals. FIM: Function - Bathing Position: Shower Body parts bathed by patient: Right arm, Left arm, Chest, Abdomen, Front perineal area, Buttocks, Right upper leg, Left upper leg, Right lower leg, Left lower leg Body parts bathed by helper: Back Assist Level: Touching or steadying assistance(Pt > 75%)  Function- Upper Body Dressing/Undressing What is  the patient wearing?: Pull over shirt/dress Pull over shirt/dress - Perfomed by patient: Thread/unthread right sleeve, Pull shirt over trunk, Thread/unthread left sleeve, Put head through opening Assist Level: Set up Function - Lower Body Dressing/Undressing What is the patient wearing?: Shoes, [employer], Underwear, Pants Position: Sitting EOB Underwear - Performed by patient: Thread/unthread right underwear leg, Thread/unthread left underwear leg, Pull underwear up/down Pants- Performed by patient: Thread/unthread right pants leg, Thread/unthread left pants leg, Pull pants up/down, Fasten/unfasten pants Non-skid slipper socks-  Performed by helper: Don/doff right sock, Don/doff left sock Socks - Performed by patient: Don/doff right sock, Don/doff left sock Shoes - Performed by patient: Don/doff right shoe, Don/doff left shoe Shoes - Performed by helper: Fasten right, Fasten left TED Hose - Performed by helper: Don/doff right TED hose, Don/doff left TED hose Assist for footwear: Setup Assist for lower body dressing: Touching or steadying assistance (Pt > 75%)  Function - Toileting Toileting steps completed by patient: Performs perineal hygiene, Adjust clothing after toileting, Adjust clothing prior to toileting Toileting Assistive Devices: Grab bar or rail Assist level: Touching or steadying assistance (Pt.75%)  Function - Air cabin crew transfer assistive device: Elevated toilet seat/BSC over toilet, Walker Assist level to toilet: Moderate assist (Pt 50 - 74%/lift or lower) Assist level from toilet: Moderate assist (Pt 50 - 74%/lift or lower)  Function - Chair/bed transfer Chair/bed transfer method: Ambulatory Chair/bed transfer assist level: Supervision or verbal cues Chair/bed transfer assistive device: Armrests, Walker Chair/bed transfer details: Verbal cues for technique, Verbal cues for safe use of DME/AE  Function - Locomotion: Wheelchair Type: Manual Max wheelchair distance: 150 Assist Level: No help, No cues, assistive device, takes more than reasonable amount of time Assist Level: No help, No cues, assistive device, takes more than reasonable amount of time Assist Level: No help, No cues, assistive device, takes more than reasonable amount of time Turns around,maneuvers to table,bed, and toilet,negotiates 3% grade,maneuvers on rugs and over doorsills: Yes Function - Locomotion: Ambulation Assistive device: Walker-rolling Max distance: 150 Assist level: Supervision or verbal cues Assist level: Supervision or verbal cues Assist level: Supervision or verbal cues Assist level: Supervision  or verbal cues  Function - Comprehension Comprehension: Auditory Comprehension assist level: Follows complex conversation/direction with no assist  Function - Expression Expression: Verbal Expression assist level: Expresses complex ideas: With no assist  Function - Social Interaction Social Interaction assist level: Interacts appropriately with others - No medications needed.  Function - Problem Solving Problem solving assist level: Solves complex problems: With extra time  Function - Memory Memory assist level: Complete Independence: No helper Patient normally able to recall (first 3 days only): Current season, Location of own room, Staff names and faces, That he or she is in a hospital  Medical Problem List and Plan: 1. 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  DVT Prophylaxis/Anticoagulation: Coumadin therapy. Monitor for any bleeding episodes. Check vascular study 3. Pain Management: Robaxin and Ultram as needed. Monitor with increased mobility 4. Constipation. Dc movantik per patient request--continue colace   5. Neuropsych: This patient is capable of making decisions on his own behalf. 6. Skin/Wound Care: Routine skin checks  -continue lasix 67m po through today  -aggressive ice, elevation, k-tape also  -will add sarna for rash/itching on back. 7. Fluids/Electrolytes/Nutrition: Routine I&O . Follow-up chemistries as below 8.Mitral valve prolapse/ atrial flutter status post ablation. Resume aspirin after Coumadin completed 9. Hyperlipidemia.Lovaza (hold) patient refusing right now---can resume once off coumadin  10.ABLA. Continue iron supplement. Follow-up CBC, labs personally reviewed: hgb 8.1 today---can re-check on Monday  -not surprising given excessive bruising 11. Hyponatremia: 134 today. Labs personally reviewed  LOS (Days) 4 A FACE TO FACE EVALUATION WAS PERFORMED  SWARTZ,ZACHARY T 03/18/2015,  7:32 AM

## 2015-03-18 NOTE — Progress Notes (Signed)
Occupational Therapy Session Note  Patient Details  Name: [name] MRN: [id number] Date of Birth: [birth year]  Today's Date: 03/18/2015 OT Individual Time:  -   0915-1030    (75 min)  1st session                                          1300-1330    (30 min)  2nd session      Short Term Goals: Week 1:  OT Short Term Goal 1 (Week 1): STGs=LTGs secondary to short estimated LOS :     Skilled Therapeutic Interventions/Progress Updates:    1st session:  Performed bathing and dressing at shower level.  Ambulated to toilet.  Continent of bladder.  SPilled urine on floor due to gap between toilet seat and reg toilet.  Pt retrieved clothes and ambulated to shower with min assist.  Sat on on bench .  Bathed and dressed with SBA.    .    2nd. Session:    Ambulated to gym. Performed knee flexion and extension in sitting; sit to stand, UE ex with 5 # wts. Used push up bars for tricep extension.  Ambulated back to room with min assist and max cues for heel toe stride.  .     Therapy Documentation Precautions:  Precautions Precautions: Fall Required Braces or Orthoses:  (knee immobilizers d/c'd in acute care) Knee Immobilizer - Right: Discontinue once straight leg raise with < 10 degree lag Knee Immobilizer - Left: Discontinue once straight leg raise with < 10 degree lag Restrictions Weight Bearing Restrictions: Yes RLE Weight Bearing: Weight bearing as tolerated LLE Weight Bearing: Weight bearing as tolerated     Pain: Pain Assessment Pain Assessment: 0-10 Pain Score: 5  Faces Pain Scale: Hurts a little bit Pain Type: Surgical pain Pain Location: Knee Pain Orientation: Right;Left Pain Descriptors / Indicators: Aching Pain Frequency: Intermittent Pain Onset: Gradual Patients Stated Pain Goal: 1 Pain Intervention(s): Medication (See eMAR)          See Function Navigator for Current Functional Status.   Therapy/Group: Individual Therapy  Lisa Roca 03/18/2015, 7:50 AM

## 2015-03-18 NOTE — Progress Notes (Signed)
ANTICOAGULATION CONSULT NOTE - Follow Up Consult  Pharmacy Consult for coumadin Indication: VTE prophylaxis  Allergies  Allergen Reactions  . Statins Other (See Comments)    Messes with liver function  . Codeine     REACTION: nausea  . Tape Rash    Patient Measurements: Height: 5' 10"  (177.8 cm) Weight: 236 lb 3.2 oz (107.14 kg) IBW/kg (Calculated) : 73 Heparin Dosing Weight:   Vital Signs: Temp: 97.7 F (36.5 C) (10/28 0536) Temp Source: Oral (10/28 0536) BP: 129/63 mmHg (10/28 0536) Pulse Rate: 68 (10/28 0536)  Labs:  Recent Labs  03/16/15 0538 03/17/15 0521 03/18/15 0528  HGB  --   --  8.1*  HCT  --   --  24.7*  LABPROT 24.8* 26.9* 25.4*  INR 2.27* 2.53* 2.34*  CREATININE  --   --  0.80    Estimated Creatinine Clearance: 118.8 mL/min (by C-G formula based on Cr of 0.8).   Medications:  Scheduled:  . docusate sodium  100 mg Oral BID  . iron polysaccharides  150 mg Oral Daily  . Warfarin - Pharmacist Dosing Inpatient   Does not apply q1800   Infusions:    Assessment: 64 yo male is currently on therapeutic coumadin for VTE prophylaxis.  INR today is 2.34.  Goal of Therapy:  INR 2-3 Monitor platelets by anticoagulation protocol: Yes   Plan:  - coumadin 5 mg po x1 - INR in am  [name], [name] 03/18/2015,8:36 AM

## 2015-03-19 ENCOUNTER — Inpatient Hospital Stay (HOSPITAL_COMMUNITY): Payer: Managed Care, Other (non HMO) | Admitting: Occupational Therapy

## 2015-03-19 DIAGNOSIS — D62 Acute posthemorrhagic anemia: Secondary | ICD-10-CM

## 2015-03-19 LAB — PROTIME-INR
INR: 2.18 — AB (ref 0.00–1.49)
Prothrombin Time: 24.1 seconds — ABNORMAL HIGH (ref 11.6–15.2)

## 2015-03-19 MED ORDER — WARFARIN SODIUM 7.5 MG PO TABS
7.5000 mg | ORAL_TABLET | Freq: Once | ORAL | Status: AC
Start: 2015-03-19 — End: 2015-03-19
  Administered 2015-03-19: 7.5 mg via ORAL
  Filled 2015-03-19: qty 1

## 2015-03-19 NOTE — Progress Notes (Signed)
Subjective/Complaints: Feels that swelling is continually better. Had a good night. Itching much better.     ROS:  Denies CP, SOB, n/v/d. +anxious  Objective: Vital Signs: Blood pressure 128/58, pulse 75, temperature 97.8 F (36.6 C), temperature source Oral, resp. rate 18, height 5' 10"  (1.778 m), weight 107.14 kg (236 lb 3.2 oz), SpO2 96 %. No results found. Results for orders placed or performed during the hospital encounter of 03/14/15 (from the past 72 hour(s))  Protime-INR     Status: Abnormal   Collection Time: 03/17/15  5:21 AM  Result Value Ref Range   Prothrombin Time 26.9 (H) 11.6 - 15.2 seconds   INR 2.53 (H) 0.00 - 1.49  Protime-INR     Status: Abnormal   Collection Time: 03/18/15  5:28 AM  Result Value Ref Range   Prothrombin Time 25.4 (H) 11.6 - 15.2 seconds   INR 2.34 (H) 0.00 - 1.49  Hemoglobin and hematocrit, blood     Status: Abnormal   Collection Time: 03/18/15  5:28 AM  Result Value Ref Range   Hemoglobin 8.1 (L) 13.0 - 17.0 g/dL   HCT 24.7 (L) 39.0 - 15.1 %  Basic metabolic panel     Status: Abnormal   Collection Time: 03/18/15  5:28 AM  Result Value Ref Range   Sodium 134 (L) 135 - 145 mmol/L   Potassium 4.0 3.5 - 5.1 mmol/L   Chloride 98 (L) 101 - 111 mmol/L   CO2 25 22 - 32 mmol/L   Glucose, Bld 114 (H) 65 - 99 mg/dL   BUN 12 6 - 20 mg/dL   Creatinine, Ser 0.80 0.61 - 1.24 mg/dL   Calcium 8.4 (L) 8.9 - 10.3 mg/dL   GFR calc non Af Amer >60 >60 mL/min   GFR calc Af Amer >60 >60 mL/min    Comment: (NOTE) The eGFR has been calculated using the CKD EPI equation. This calculation has not been validated in all clinical situations. eGFR's persistently <60 mL/min signify possible Chronic Kidney Disease.    Anion gap 11 5 - 15  Protime-INR     Status: Abnormal   Collection Time: 03/19/15  6:05 AM  Result Value Ref Range   Prothrombin Time 24.1 (H) 11.6 - 15.2 seconds   INR 2.18 (H) 0.00 - 1.49     Gen NAD. Vital signs reviewed.  HEENT:  Normocephalic, atraumatic Cardio: RRR and no murmur Resp: CTA B/L and unlabored GI: BS positive and NT, ND Musc/Skel:  Tender b/l knees, knee flexion 90 degrees bilaterally Edema throughout LE with improving- -still with extensive bruising from thighs to feet bilaterally.  Strength: 4- to 4 bilateral HF (limitied due to pain), 5/5 distally at ankles Neuro: Alert/Oriented,  Skin:   Drying maculopapular rash on back improving.  Knee incisiton CDI Psych: anxious  Assessment/Plan: 1. 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 Physiatrist is providing close team supervision and 24 hour management of active medical problems listed below. Physiatrist and rehab team continue to assess barriers to discharge/monitor patient progress toward functional and medical goals. FIM: Function - Bathing Position: Shower Body parts bathed by patient: Right arm, Left arm, Chest, Abdomen, Front perineal area, Buttocks, Right upper leg, Left upper leg, Right lower leg, Left lower leg Body parts bathed by helper: Back Assist Level: Touching or steadying assistance(Pt > 75%)  Function- Upper Body Dressing/Undressing What is the patient  wearing?: Pull over shirt/dress Pull over shirt/dress - Perfomed by patient: Thread/unthread right sleeve, Pull shirt over trunk, Thread/unthread left sleeve, Put head through opening Assist Level: Set up Function - Lower Body Dressing/Undressing What is the patient wearing?: Shoes, [employer], Underwear, Pants Position: Sitting EOB Underwear - Performed by patient: Thread/unthread right underwear leg, Thread/unthread left underwear leg, Pull underwear up/down Pants- Performed by patient: Thread/unthread right pants leg, Thread/unthread left pants leg, Pull pants up/down, Fasten/unfasten pants Non-skid slipper socks-  Performed by helper: Don/doff right sock, Don/doff left sock Socks - Performed by patient: Don/doff right sock, Don/doff left sock Shoes - Performed by patient: Don/doff right shoe, Don/doff left shoe Shoes - Performed by helper: Fasten right, Fasten left TED Hose - Performed by helper: Don/doff right TED hose, Don/doff left TED hose Assist for footwear: Setup Assist for lower body dressing: Supervision or verbal cues  Function - Toileting Toileting steps completed by patient: Adjust clothing prior to toileting, Performs perineal hygiene, Adjust clothing after toileting Toileting Assistive Devices: Grab bar or rail Assist level: Supervision or verbal cues  Function - Air cabin crew transfer assistive device: Elevated toilet seat/BSC over toilet, Walker Assist level to toilet: Touching or steadying assistance (Pt > 75%) Assist level from toilet: Touching or steadying assistance (Pt > 75%)  Function - Chair/bed transfer Chair/bed transfer method: Ambulatory Chair/bed transfer assist level: Supervision or verbal cues Chair/bed transfer assistive device: Walker Chair/bed transfer details: Verbal cues for technique, Verbal cues for safe use of DME/AE  Function - Locomotion: Wheelchair Type: Manual Max wheelchair distance: 150 Assist Level: No help, No cues, assistive device, takes more than reasonable amount of time Assist Level: No help, No cues, assistive device, takes more than reasonable amount of time Assist Level: No help, No cues, assistive device, takes more than reasonable amount of time Turns around,maneuvers to table,bed, and toilet,negotiates 3% grade,maneuvers on rugs and over doorsills: Yes Function - Locomotion: Ambulation Assistive device: Walker-rolling Max distance: 200 Assist level: Supervision or verbal cues Assist level: Supervision or verbal cues Assist level: Supervision or verbal cues Assist level: Supervision or verbal cues  Function -  Comprehension Comprehension: Auditory Comprehension assist level: Follows complex conversation/direction with no assist  Function - Expression Expression: Verbal Expression assist level: Expresses complex ideas: With no assist  Function - Social Interaction Social Interaction assist level: Interacts appropriately with others - No medications needed.  Function - Problem Solving Problem solving assist level: Solves complex problems: With extra time  Function - Memory Memory assist level: Complete Independence: No helper Patient normally able to recall (first 3 days only): Current season, Location of own room, Staff names and faces, That he or she is in a hospital  Medical Problem List and Plan: 1. 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  DVT Prophylaxis/Anticoagulation: Coumadin therapy.  . Dopplers negative 3. Pain Management: Robaxin and Ultram as needed. Monitor with increased mobility 4. Constipation. Dc'ed movantik per patient request--continue colace   5. Neuropsych: This patient is capable of making decisions on his own behalf. 6. Skin/Wound Care: Routine skin checks  -lasix given x 2  -aggressive ice, elevation, k-tape also should continue  -sarna helpful for pruritus 7. Fluids/Electrolytes/Nutrition: Routine I&O .   8.Mitral valve prolapse/ atrial flutter status post ablation. Resume aspirin after Coumadin completed 9. Hyperlipidemia.Lovaza (hold) patient refusing right now---can resume once off coumadin 10.ABLA. Continue iron supplement. Follow-up CBC, labs personally reviewed: hgb 8.1 today---  re-check on Monday  -not  surprising given excessive bruising 11. Hyponatremia: 134 friday  LOS (Days) 5 A FACE TO FACE EVALUATION WAS PERFORMED  SWARTZ,ZACHARY T 03/19/2015, 8:19 AM

## 2015-03-19 NOTE — Progress Notes (Signed)
ANTICOAGULATION CONSULT NOTE - Follow Up Consult  Pharmacy Consult for coumadin Indication: VTE prophylaxis  Allergies  Allergen Reactions  . Statins Other (See Comments)    Messes with liver function  . Codeine     REACTION: nausea  . Tape Rash    Patient Measurements: Height: 5' 10"  (177.8 cm) Weight: 236 lb 3.2 oz (107.14 kg) IBW/kg (Calculated) : 73 Heparin Dosing Weight:   Vital Signs: Temp: 97.8 F (36.6 C) (10/29 0520) Temp Source: Oral (10/29 0520) BP: 128/58 mmHg (10/29 0520) Pulse Rate: 75 (10/29 0520)  Labs:  Recent Labs  03/17/15 0521 03/18/15 0528 03/19/15 0605  HGB  --  8.1*  --   HCT  --  24.7*  --   LABPROT 26.9* 25.4* 24.1*  INR 2.53* 2.34* 2.18*  CREATININE  --  0.80  --     Estimated Creatinine Clearance: 118.8 mL/min (by C-G formula based on Cr of 0.8).   Medications:  Scheduled:  . docusate sodium  100 mg Oral BID  . iron polysaccharides  150 mg Oral Daily  . Warfarin - Pharmacist Dosing Inpatient   Does not apply q1800   Infusions:    Assessment: 64 yo male is currently on therapeutic coumadin for VTE prophylaxis.  INR today is 2.18. INR slowly decreasing on 61m dose for past 3 days. Will give 7.5 mg dose tonight.  Goal of Therapy:  INR 2-3 Monitor platelets by anticoagulation protocol: Yes   Plan:  - Coumadin 7.523mpo x1 - INR in am  AlDarl PikesPharmD Clinical Pharmacist- Resident Pager: [phone] Pikes0/29/2016,10:44 AM

## 2015-03-19 NOTE — Progress Notes (Signed)
Occupational Therapy Session Note  Patient Details  Name: [name] MRN: [id number] Date of Birth: 11/03/1950  Today's Date: 03/19/2015 OT Individual Time: 1500-1533 OT Individual Time Calculation (min): 33 min    Skilled Therapeutic Interventions/Progress Updates:    Pt ambulated to the therapy gym with supervision using the RW.  He performed the Nustep for 3 intervals of 5 mins during session, with use of the LEs only.  First set for 5 mins with resistance set on level 2 and pt maintaining 50-55 steps per minute.  Second and third sets for 5 mins as well with resistance increased to level 5 with steps per minute around 48-50.  Pt ambulated back to room at conclusion of sets and placed in bedside chair.  Ice packs provided for pain relief.    Therapy Documentation Precautions:  Precautions Precautions: Fall Required Braces or Orthoses:  (knee immobilizers d/c'd in acute care) Knee Immobilizer - Right: Discontinue once straight leg raise with < 10 degree lag Knee Immobilizer - Left: Discontinue once straight leg raise with < 10 degree lag Restrictions Weight Bearing Restrictions: No RLE Weight Bearing: Weight bearing as tolerated LLE Weight Bearing: Weight bearing as tolerated  Pain: Pain Assessment Pain Assessment: Faces Pain Score: 6  Faces Pain Scale: Hurts a little bit Pain Type: Surgical pain Pain Location: Knee Pain Orientation: Right;Left Pain Descriptors / Indicators: Aching Pain Frequency: Intermittent Pain Onset: Gradual Pain Intervention(s): Medication (See eMAR) ADL: See Function Navigator for Current Functional Status.   Therapy/Group: Individual Therapy  [name] OTR/L 03/19/2015, 4:19 PM

## 2015-03-20 ENCOUNTER — Inpatient Hospital Stay (HOSPITAL_COMMUNITY): Payer: Managed Care, Other (non HMO) | Admitting: *Deleted

## 2015-03-20 ENCOUNTER — Inpatient Hospital Stay (HOSPITAL_COMMUNITY): Payer: Managed Care, Other (non HMO) | Admitting: Occupational Therapy

## 2015-03-20 LAB — PROTIME-INR
INR: 2.12 — ABNORMAL HIGH (ref 0.00–1.49)
Prothrombin Time: 23.6 seconds — ABNORMAL HIGH (ref 11.6–15.2)

## 2015-03-20 MED ORDER — WARFARIN SODIUM 7.5 MG PO TABS
7.5000 mg | ORAL_TABLET | Freq: Once | ORAL | Status: AC
  Administered 2015-03-20: 7.5 mg via ORAL
  Filled 2015-03-20: qty 1

## 2015-03-20 NOTE — Progress Notes (Signed)
Physical Therapy Session Note  Patient Details  Name: [name] MRN: [id number] Date of Birth: 04/07/1951  Today's Date: 03/20/2015 PT Individual Time: 1005-1105 and 13:55-14:25  PT Individual Time Calculation (min): 60 min and 84mn  Short Term Goals: Week 1:  PT Short Term Goal 1 (Week 1): STGs=LTGs due to short ELOS  Skilled Therapeutic Interventions/Progress Updates:  First Tx focused on functional mobility training, gait with RW, stairs, and therex for increased joint mobility and strength. Pt educated on resting positioning following TKA, and bed was locked in LE straight position to support full ext. Pt requested manual therapy to bil IT band and quads today which increased mobility and reduce pain per his report. Swelling much improved from Kinesio tape, still intact.   Pt performed all sit<>stand transfers with close S and cues for beginning to increase flexion with sit and standing movements as able.  Bed mobility performed Mod I at mat.   Nustep x10 min at level 3>>5 for increased resistance and increased ROM with chair distance for more challenge as able.   Supine manual therapy performed along IT band, quad, and adductors with and without foam roller x5 min each leg. Also performed opposite knee to shoulder stretch gently for increase stretch at IT and across lumbar spine. Instructed pt in hooklying lower trunk rotation and educated pt on imporatnce of mobility due to new alignment after years of extreeme genu varus alignment.  Pt instructed in quad sets and SAQ with 5 sec holds as well.   Stair training x8 total: 4 with 2 rails forward ascending/descending and 4 with R rail only sidestepping and Min A.   Gait training 2x200' with RW and close S with cues for normalizing gait pattern. Pt with improving knee flexion in swing as well as heel strike, but needed cues for decreasing UE weight bearing and scapular depression.   Second tx focused on functional ambulation and  therex for LE strengthening and ROM, handout provided.  Sit<>stand transfers performed with S and cues for functional knee ROM. Assessed AAROM in supine and sitting:  RLE: -2 deg ext and 92 deg flexion LLE -2 deg ext and 88 deg flexion  Pt instructed in gait training 1x400' and 1x200' on unit with cues as above.  Pt provided with TKA HEP, performed with cues and demo x10 each as well as standing marching and heel/toe raises. Pt left up in recliner with pillow beneath ankles, ice packs, all needs in reach.       Therapy Documentation Precautions:  Precautions Precautions: Fall Required Braces or Orthoses:  (knee immobilizers d/c'd in acute care) Knee Immobilizer - Right: Discontinue once straight leg raise with < 10 degree lag Knee Immobilizer - Left: Discontinue once straight leg raise with < 10 degree lag Restrictions Weight Bearing Restrictions: Yes RLE Weight Bearing: Weight bearing as tolerated LLE Weight Bearing: Weight bearing as tolerated General:   Vital Signs:   Pain: Pain Assessment Pain Assessment: 0-10 Pain Score: 6  Pain Type: Acute pain Pain Location: Knee Pain Orientation: Right;Left Pain Descriptors / Indicators: Aching Pain Onset: Gradual Pain Intervention(s): repositioned, provided manual therapy  See Function Navigator for Current Functional Status.   Therapy/Group: Individual Therapy  CKennieth Rad PT, DPT   KKennieth RadM 03/20/2015, 12:27 PM

## 2015-03-20 NOTE — Progress Notes (Signed)
ANTICOAGULATION CONSULT NOTE - Follow Up Consult  Pharmacy Consult for coumadin Indication: VTE prophylaxis  Allergies  Allergen Reactions  . Statins Other (See Comments)    Messes with liver function  . Codeine     REACTION: nausea  . Tape Rash    Patient Measurements: Height: 5' 10"  (177.8 cm) Weight: 236 lb 3.2 oz (107.14 kg) IBW/kg (Calculated) : 73 Heparin Dosing Weight:   Vital Signs: Temp: 98.6 F (37 C) (10/30 0555) Temp Source: Oral (10/30 0555) BP: 128/60 mmHg (10/30 0555) Pulse Rate: 69 (10/30 0555)  Labs:  Recent Labs  03/18/15 0528 03/19/15 0605 03/20/15 0530  HGB 8.1*  --   --   HCT 24.7*  --   --   LABPROT 25.4* 24.1* 23.6*  INR 2.34* 2.18* 2.12*  CREATININE 0.80  --   --     Estimated Creatinine Clearance: 118.8 mL/min (by C-G formula based on Cr of 0.8).   Medications:  Scheduled:  . docusate sodium  100 mg Oral BID  . iron polysaccharides  150 mg Oral Daily  . Warfarin - Pharmacist Dosing Inpatient   Does not apply q1800   Infusions:    Assessment: 64 yo male is currently on therapeutic coumadin for VTE prophylaxis.  INR today is 2.18. INR slowly decreasing the past 4 days. Increased dose to 7.23m yesterday. Will give 7.5 mg dose again tonight.  Goal of Therapy:  INR 2-3 Monitor platelets by anticoagulation protocol: Yes   Plan:  - Coumadin 7.531mpo x1 - INR in am  AlDarl PikesPharmD Clinical Pharmacist- Resident Pager: [phone] Pikes0/30/2016,8:13 AM

## 2015-03-20 NOTE — Progress Notes (Signed)
Subjective/Complaints: Had a good day and night. Feels that he's getting stronger. Swelling better too. Numerous questions about follow up.     ROS:  Denies CP, SOB, n/v/d. +anxious  Objective: Vital Signs: Blood pressure 128/60, pulse 69, temperature 98.6 F (37 C), temperature source Oral, resp. rate 17, height 5' 10"  (1.778 m), weight 107.14 kg (236 lb 3.2 oz), SpO2 95 %. No results found. Results for orders placed or performed during the hospital encounter of 03/14/15 (from the past 72 hour(s))  Protime-INR     Status: Abnormal   Collection Time: 03/18/15  5:28 AM  Result Value Ref Range   Prothrombin Time 25.4 (H) 11.6 - 15.2 seconds   INR 2.34 (H) 0.00 - 1.49  Hemoglobin and hematocrit, blood     Status: Abnormal   Collection Time: 03/18/15  5:28 AM  Result Value Ref Range   Hemoglobin 8.1 (L) 13.0 - 17.0 g/dL   HCT 24.7 (L) 39.0 - 50.0 %  Basic metabolic panel     Status: Abnormal   Collection Time: 03/18/15  5:28 AM  Result Value Ref Range   Sodium 134 (L) 135 - 145 mmol/L   Potassium 4.0 3.5 - 5.1 mmol/L   Chloride 98 (L) 101 - 111 mmol/L   CO2 25 22 - 32 mmol/L   Glucose, Bld 114 (H) 65 - 99 mg/dL   BUN 12 6 - 20 mg/dL   Creatinine, Ser 0.80 0.61 - 1.24 mg/dL   Calcium 8.4 (L) 8.9 - 10.3 mg/dL   GFR calc non Af Amer >60 >60 mL/min   GFR calc Af Amer >60 >60 mL/min    Comment: (NOTE) The eGFR has been calculated using the CKD EPI equation. This calculation has not been validated in all clinical situations. eGFR's persistently <60 mL/min signify possible Chronic Kidney Disease.    Anion gap 11 5 - 15  Protime-INR     Status: Abnormal   Collection Time: 03/19/15  6:05 AM  Result Value Ref Range   Prothrombin Time 24.1 (H) 11.6 - 15.2 seconds   INR 2.18 (H) 0.00 - 1.49  Protime-INR     Status: Abnormal   Collection Time: 03/20/15  5:30 AM  Result Value Ref Range   Prothrombin Time 23.6 (H) 11.6 - 15.2 seconds   INR 2.12 (H) 0.00 - 1.49     Gen NAD. Vital  signs reviewed.  HEENT: Normocephalic, atraumatic Cardio: RRR and no murmur Resp: CTA B/L and unlabored GI: BS positive and NT, ND Musc/Skel:  Tender b/l knees, knee flexion 90 degrees bilaterally Edema throughout LE with improving- -still with extensive bruising from thighs to feet bilaterally.  Strength: 4- to 4 bilateral HF (limitied due to pain), 5/5 distally at ankles Neuro: Alert/Oriented,  Skin:   Drying maculopapular rash on back improving.  Knee incisiton CDI Psych: anxious  Assessment/Plan: 1. 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 Physiatrist is providing close team supervision and 24 hour management of active medical problems listed below. Physiatrist and rehab team continue to assess barriers to discharge/monitor patient progress toward functional and medical goals. FIM: Function - Bathing Position: Shower Body parts bathed by patient: Right arm, Left arm, Chest, Abdomen, Front perineal area, Buttocks, Right upper leg, Left upper leg, Right lower leg, Left lower leg Body parts bathed by helper: Back Assist Level: More than reasonable time  Function- Upper Body Dressing/Undressing  What is the patient wearing?: Pull over shirt/dress Pull over shirt/dress - Perfomed by patient: Thread/unthread right sleeve, Thread/unthread left sleeve, Put head through opening, Pull shirt over trunk Assist Level: Set up Function - Lower Body Dressing/Undressing What is the patient wearing?: Pants, Underwear Position: Sitting EOB Underwear - Performed by patient: Thread/unthread right underwear leg, Thread/unthread left underwear leg, Pull underwear up/down Pants- Performed by patient: Thread/unthread right pants leg, Pull pants up/down, Fasten/unfasten pants, Thread/unthread left pants leg Non-skid slipper socks- Performed  by helper: Don/doff right sock, Don/doff left sock Socks - Performed by patient: Don/doff right sock, Don/doff left sock Shoes - Performed by patient: Don/doff right shoe, Don/doff left shoe Shoes - Performed by helper: Fasten right, Fasten left TED Hose - Performed by helper: Don/doff right TED hose, Don/doff left TED hose Assist for footwear: Setup Assist for lower body dressing: Supervision or verbal cues  Function - Toileting Toileting steps completed by patient: Adjust clothing prior to toileting, Performs perineal hygiene, Adjust clothing after toileting Toileting Assistive Devices: Grab bar or rail Assist level: Supervision or verbal cues  Function - Air cabin crew transfer assistive device: Pension scheme manager level to toilet: Supervision or verbal cues Assist level from toilet: Supervision or verbal cues  Function - Chair/bed transfer Chair/bed transfer method: Ambulatory Chair/bed transfer assist level: Supervision or verbal cues Chair/bed transfer assistive device: Walker Chair/bed transfer details: Verbal cues for technique, Verbal cues for safe use of DME/AE  Function - Locomotion: Wheelchair Type: Manual Max wheelchair distance: 150 Assist Level: No help, No cues, assistive device, takes more than reasonable amount of time Assist Level: No help, No cues, assistive device, takes more than reasonable amount of time Assist Level: No help, No cues, assistive device, takes more than reasonable amount of time Turns around,maneuvers to table,bed, and toilet,negotiates 3% grade,maneuvers on rugs and over doorsills: Yes Function - Locomotion: Ambulation Assistive device: Walker-rolling Max distance: 200 Assist level: Supervision or verbal cues Assist level: Supervision or verbal cues Assist level: Supervision or verbal cues Assist level: Supervision or verbal cues  Function - Comprehension Comprehension: Auditory Comprehension assist level: Follows complex  conversation/direction with no assist  Function - Expression Expression: Verbal Expression assist level: Expresses complex ideas: With no assist  Function - Social Interaction Social Interaction assist level: Interacts appropriately with others - No medications needed.  Function - Problem Solving Problem solving assist level: Solves complex problems: With extra time  Function - Memory Memory assist level: Complete Independence: No helper Patient normally able to recall (first 3 days only): Current season, Location of own room, Staff names and faces, That he or she is in a hospital  Medical Problem List and Plan: 1. 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  DVT Prophylaxis/Anticoagulation: Coumadin therapeutic.   Dopplers negative 3. Pain Management: Robaxin and Ultram as needed. Monitor with increased mobility 4. Constipation. Dc'ed movantik per patient request--continue colace   5. Neuropsych: This patient is capable of making decisions on his own behalf. 6. Skin/Wound Care: edema gradually improving  -lasix given x 2  -aggressive ice, elevation, k-tape also should continue  -sarna helpful for pruritus 7. Fluids/Electrolytes/Nutrition: Routine I&O .   8.Mitral valve prolapse/ atrial flutter status post ablation. Resume aspirin after Coumadin completed 9. Hyperlipidemia.Lovaza (hold) patient refusing right now---can resume once off coumadin 10.ABLA. Continue iron supplement. Follow-up CBC, labs personally reviewed: hgb 8.1 today---  re-check tomorrow  -not surprising given excessive bruising 11. Hyponatremia: 134 friday  LOS (Days)  6 A FACE TO FACE EVALUATION WAS PERFORMED  Priyal Musquiz T 03/20/2015, 8:47 AM

## 2015-03-20 NOTE — Progress Notes (Addendum)
Occupational Therapy Session Note  Patient Details  Name: [name] MRN: [id number] Date of Birth: January 19, 1951  Today's Date: 03/20/2015 OT Individual Time:  - 0900-1000  (41mn)  1st session                                         1500-1545  (45 min)  2nd session     Short Term Goals: Week 1:  OT Short Term Goal 1 (Week 1): STGs=LTGs secondary to short estimated LOS :     Skilled Therapeutic Interventions/Progress Updates:    1st session: Performed bathing and dressing at shower level. Ambulated to toilet. Continent of bladder.Wife present and OT educated her on safety precautions with mobility and transfers.    Pt retrieved clothes and ambulated to shower with SBt. Sat on bench for bathe;  Stood for peri care.  Used LH sponge for bathing.  Wife kept close to pt during shower.  Ambulated out to toilet to dry and dress.  Wife donned TEDs.  Pt needed assist with footies.  He dressed remaining items.   Checked wife off to do toilet and shower transfers.    2nd session:  Ambulated from room to gym with RW and SBA.   Integrated heel toe ankle strategy.  Sat on EOM and did knee slides x20;   Did leg extension x20; knee flexion with holding at 90 degrees for 1 minute; Hamstring stretch.    Performed supine mat exercises with knee flexion and knees side to side with arms stretched out to side.  Did straight leg raises x10.  Ambulated back to rooma at end of session.     .  .   Therapy Documentation Precautions:  Precautions Precautions: Fall Required Braces or Orthoses:  (knee immobilizers d/c'd in acute care) Knee Immobilizer - Right: Discontinue once straight leg raise with < 10 degree lag Knee Immobilizer - Left: Discontinue once straight leg raise with < 10 degree lag Restrictions Weight Bearing Restrictions: Yes RLE Weight Bearing: Weight bearing as tolerated LLE Weight Bearing: Weight bearing as tolerated      Pain: Pain Assessment Pain Assessment: 3-10  With  exercises Pain Score: 3 Pain Type: Acute pain Pain Location: Knee Pain Orientation: Right;Left Pain Descriptors / Indicators: Aching Pain Onset: Gradual Pain Intervention(s): Medication (See eMAR) :         See Function Navigator for Current Functional Status.   Therapy/Group: Individual Therapy  [name]10/30/2016, 7:48 AM

## 2015-03-21 ENCOUNTER — Inpatient Hospital Stay (HOSPITAL_COMMUNITY): Payer: Managed Care, Other (non HMO) | Admitting: Occupational Therapy

## 2015-03-21 ENCOUNTER — Inpatient Hospital Stay (HOSPITAL_COMMUNITY): Payer: Managed Care, Other (non HMO)

## 2015-03-21 ENCOUNTER — Inpatient Hospital Stay (HOSPITAL_COMMUNITY): Payer: Managed Care, Other (non HMO) | Admitting: Physical Therapy

## 2015-03-21 LAB — PROTIME-INR
INR: 2.12 — ABNORMAL HIGH (ref 0.00–1.49)
Prothrombin Time: 23.6 seconds — ABNORMAL HIGH (ref 11.6–15.2)

## 2015-03-21 LAB — BASIC METABOLIC PANEL
ANION GAP: 7 (ref 5–15)
BUN: 12 mg/dL (ref 6–20)
CALCIUM: 8.6 mg/dL — AB (ref 8.9–10.3)
CHLORIDE: 102 mmol/L (ref 101–111)
CO2: 24 mmol/L (ref 22–32)
Creatinine, Ser: 0.82 mg/dL (ref 0.61–1.24)
GFR calc non Af Amer: >60 mL/min (ref >60)
GLUCOSE: 111 mg/dL — AB (ref 65–99)
Potassium: 3.9 mmol/L (ref 3.5–5.1)
Sodium: 133 mmol/L — ABNORMAL LOW (ref 135–145)

## 2015-03-21 LAB — CBC
HEMATOCRIT: 28.3 % — AB (ref 39.0–52.0)
HEMOGLOBIN: 9 g/dL — AB (ref 13.0–17.0)
MCH: 29.7 pg (ref 26.0–34.0)
MCHC: 31.8 g/dL (ref 30.0–36.0)
MCV: 93.4 fL (ref 78.0–100.0)
Platelets: 383 10*3/uL (ref 150–400)
RBC: 3.03 MIL/uL — ABNORMAL LOW (ref 4.22–5.81)
RDW: 14.5 % (ref 11.5–15.5)
WBC: 8.3 10*3/uL (ref 4.0–10.5)

## 2015-03-21 MED ORDER — WARFARIN SODIUM 7.5 MG PO TABS
7.5000 mg | ORAL_TABLET | Freq: Once | ORAL | Status: AC
Start: 2015-03-21 — End: 2015-03-21
  Administered 2015-03-21: 7.5 mg via ORAL
  Filled 2015-03-21: qty 1

## 2015-03-21 NOTE — Progress Notes (Signed)
Physical Therapy Session Note  Patient Details  Name: [name] MRN: [id number] Date of Birth: 04/14/1951  Today's Date: 03/21/2015 PT Individual Time: 1411-1508 PT Individual Time Calculation (min): 57 min   Short Term Goals: Week 1:  PT Short Term Goal 1 (Week 1): STGs=LTGs due to short ELOS  Skilled Therapeutic Interventions/Progress Updates:   Pt received in recliner; pt reporting significant pain in L arch.  Discussed with pt biomechanical changes in stance with bilat TKR and increased pressure on arches; pt noted to be pronating in current shoes; recommending pt obtain new shoes with increased arch support or orthotic inserts for support as well as use of massage ball on arch after therapy. Placed massage ball in freezer for use at end of session.  Pt performed transfer recliner > w/c stand pivot with RW supervision.  Pt performed w/c mobility to gym mod I.  Pt transferred to Nustep stand pivot supervision.  Pt performed UE and LE strengthening, ROM and endurance training at level 6 resistance x 8 minutes at level 12-13 on BORG RPE.  Transitioned to prone on mat with focus on glute sets and quad sets for gravity assisted hamstring stretches and hip flexor stretches.  Pt also performed glute med strengthening in R and L sidelying: 10 reps each open chain hip ABD, hip extension circles, hip flexion<>extension with rest breaks.  Performed rolling on flat mat and supine <> sit on flat mat mod I with extra time.  Pt ambulated to stairs x 15' x 2 with verbal cues for gait sequence with focus on knee flexion during advancement and heel strike bilaterally.  Performed stair negotiation up/down 4 stairs laterally with L rail with supervision and verbal cues for sequence.  Returned to room and pt performed ambulation with RW to bathroom and performed toileting in standing with distant supervision.  Returned to recliner and pt instructed on use of cooled massage ball under L arch for pain management.   Pt left with wife and daughter present and ice packs for bilat knees.    Therapy Documentation Precautions:  Precautions Precautions: Fall Required Braces or Orthoses:  (knee immobilizers d/c'd in acute care) Knee Immobilizer - Right: Discontinue once straight leg raise with < 10 degree lag Knee Immobilizer - Left: Discontinue once straight leg raise with < 10 degree lag Restrictions Weight Bearing Restrictions: Yes RLE Weight Bearing: Weight bearing as tolerated LLE Weight Bearing: Weight bearing as tolerated Vital Signs: Therapy Vitals Temp: 98.1 F (36.7 C) Temp Source: Oral Pulse Rate: 74 Resp: 17 BP: 125/65 mmHg Patient Position (if appropriate): Sitting Oxygen Therapy SpO2: 98 % O2 Device: Not Delivered Pain: Pain Assessment Pain Score: 5  Pain Type: Acute pain Pain Location: Foot Pain Orientation: Left Pain Descriptors / Indicators: Aching;Sore Pain Onset: On-going Pain Intervention(s): Cold applied;Massage  See Function Navigator for Current Functional Status.   Therapy/Group: Individual Therapy  Raylene Everts Faucette 03/21/2015, 4:05 PM

## 2015-03-21 NOTE — Progress Notes (Signed)
Subjective/Complaints: No major problems since yesterday. A little restless last night. Pain controlled. Moving better.     ROS:  Denies CP, SOB, n/v/d. +anxious  Objective: Vital Signs: Blood pressure 127/60, pulse 79, temperature 99 F (37.2 C), temperature source Oral, resp. rate 18, height 5' 10"  (1.778 m), weight 107.14 kg (236 lb 3.2 oz), SpO2 97 %. No results found. Results for orders placed or performed during the hospital encounter of 03/14/15 (from the past 72 hour(s))  Protime-INR     Status: Abnormal   Collection Time: 03/19/15  6:05 AM  Result Value Ref Range   Prothrombin Time 24.1 (H) 11.6 - 15.2 seconds   INR 2.18 (H) 0.00 - 1.49  Protime-INR     Status: Abnormal   Collection Time: 03/20/15  5:30 AM  Result Value Ref Range   Prothrombin Time 23.6 (H) 11.6 - 15.2 seconds   INR 2.12 (H) 0.00 - 1.49  Protime-INR     Status: Abnormal   Collection Time: 03/21/15  4:50 AM  Result Value Ref Range   Prothrombin Time 23.6 (H) 11.6 - 15.2 seconds   INR 2.12 (H) 0.00 - 1.49  CBC     Status: Abnormal   Collection Time: 03/21/15  4:50 AM  Result Value Ref Range   WBC 8.3 4.0 - 10.5 K/uL   RBC 3.03 (L) 4.22 - 5.81 MIL/uL   Hemoglobin 9.0 (L) 13.0 - 17.0 g/dL   HCT 28.3 (L) 39.0 - 52.0 %   MCV 93.4 78.0 - 100.0 fL   MCH 29.7 26.0 - 34.0 pg   MCHC 31.8 30.0 - 36.0 g/dL   RDW 14.5 11.5 - 15.5 %   Platelets 383 150 - 400 K/uL  Basic metabolic panel     Status: Abnormal   Collection Time: 03/21/15  4:50 AM  Result Value Ref Range   Sodium 133 (L) 135 - 145 mmol/L   Potassium 3.9 3.5 - 5.1 mmol/L   Chloride 102 101 - 111 mmol/L   CO2 24 22 - 32 mmol/L   Glucose, Bld 111 (H) 65 - 99 mg/dL   BUN 12 6 - 20 mg/dL   Creatinine, Ser 0.82 0.61 - 1.24 mg/dL   Calcium 8.6 (L) 8.9 - 10.3 mg/dL   GFR calc non Af Amer >60 >60 mL/min   GFR calc Af Amer >60 >60 mL/min    Comment: (NOTE) The eGFR has been calculated using the CKD EPI equation. This calculation has not been validated  in all clinical situations. eGFR's persistently <60 mL/min signify possible Chronic Kidney Disease.    Anion gap 7 5 - 15     Gen NAD. Vital signs reviewed.  HEENT: Normocephalic, atraumatic Cardio: RRR and no murmur Resp: CTA B/L and unlabored GI: BS positive and NT, ND Musc/Skel:  Tender b/l knees, knee flexion 90 degrees bilaterally Edema throughout LE with improving- -still with extensive bruising from thighs to feet bilaterally.  Strength: 4- to 4 bilateral HF (limitied due to pain), 5/5 distally at ankles Neuro: Alert/Oriented,  Skin:   Drying maculopapular rash on back improving.  Knee incisiton CDI Psych: anxious  Assessment/Plan: 1. 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 Physiatrist is providing close team supervision and 24 hour management of active medical problems listed below. Physiatrist and rehab team continue to assess barriers to discharge/monitor patient progress toward functional and medical goals. FIM: Function -  Bathing Position: Shower Body parts bathed by patient: Right arm, Left arm, Chest, Abdomen, Front perineal area, Buttocks, Right upper leg, Left upper leg, Right lower leg, Left lower leg Body parts bathed by helper: Back Assist Level: More than reasonable time  Function- Upper Body Dressing/Undressing What is the patient wearing?: Pull over shirt/dress Pull over shirt/dress - Perfomed by patient: Thread/unthread right sleeve, Thread/unthread left sleeve, Put head through opening, Pull shirt over trunk Assist Level: More than reasonable time Function - Lower Body Dressing/Undressing What is the patient wearing?: Pants, Underwear Position: Sitting EOB Underwear - Performed by patient: Thread/unthread right underwear leg, Thread/unthread left underwear leg, Pull underwear  up/down Pants- Performed by patient: Thread/unthread right pants leg, Pull pants up/down, Fasten/unfasten pants, Thread/unthread left pants leg Non-skid slipper socks- Performed by helper: Don/doff right sock, Don/doff left sock Socks - Performed by patient: Don/doff right sock, Don/doff left sock Shoes - Performed by patient: Don/doff right shoe, Don/doff left shoe Shoes - Performed by helper: Fasten right, Fasten left TED Hose - Performed by helper: Don/doff right TED hose, Don/doff left TED hose Assist for footwear: Setup Assist for lower body dressing: Supervision or verbal cues  Function - Toileting Toileting steps completed by patient: Adjust clothing prior to toileting, Performs perineal hygiene, Adjust clothing after toileting Toileting Assistive Devices: Grab bar or rail Assist level: More than reasonable time  Function - Air cabin crew transfer assistive device: Pension scheme manager level to toilet: Supervision or verbal cues Assist level from toilet: Supervision or verbal cues  Function - Chair/bed transfer Chair/bed transfer method: Ambulatory Chair/bed transfer assist level: Supervision or verbal cues Chair/bed transfer assistive device: Walker Chair/bed transfer details: Verbal cues for technique, Verbal cues for safe use of DME/AE  Function - Locomotion: Wheelchair Type: Manual Max wheelchair distance: 150 Assist Level: No help, No cues, assistive device, takes more than reasonable amount of time Assist Level: No help, No cues, assistive device, takes more than reasonable amount of time Assist Level: No help, No cues, assistive device, takes more than reasonable amount of time Turns around,maneuvers to table,bed, and toilet,negotiates 3% grade,maneuvers on rugs and over doorsills: Yes Function - Locomotion: Ambulation Assistive device: Walker-rolling Max distance: 200 Assist level: Supervision or verbal cues Assist level: Supervision or verbal cues Assist level:  Supervision or verbal cues Assist level: Supervision or verbal cues  Function - Comprehension Comprehension: Auditory Comprehension assist level: Follows complex conversation/direction with no assist  Function - Expression Expression: Verbal Expression assist level: Expresses complex ideas: With no assist  Function - Social Interaction Social Interaction assist level: Interacts appropriately with others - No medications needed.  Function - Problem Solving Problem solving assist level: Solves complex problems: Recognizes & self-corrects  Function - Memory Memory assist level: Complete Independence: No helper Patient normally able to recall (first 3 days only): Current season, Location of own room, Staff names and faces, That he or she is in a hospital  Medical Problem List and Plan: 1. 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  DVT Prophylaxis/Anticoagulation: Coumadin therapeutic.   Dopplers negative 3. Pain Management: Robaxin and Ultram as needed. Monitor with increased mobility 4. Constipation. Dc'ed movantik per patient request--continue colace   5. Neuropsych: This patient is capable of making decisions on his own behalf. 6. Skin/Wound Care: edema gradually improving  -lasix given x 2 last week  -aggressive ice, elevation, k-tape also should continue  -sarna for pruritus 7. Fluids/Electrolytes/Nutrition: Routine I&O .   8.Mitral valve  prolapse/ atrial flutter status post ablation. Resume aspirin after Coumadin completed 9. Hyperlipidemia.Lovaza (hold) patient refusing right now---can resume once off coumadin 10.ABLA. Continue iron supplement. Follow-up CBC, labs personally reviewed: hgb 9.0 today    11. Hyponatremia: 134 friday  LOS (Days) 7 A FACE TO FACE EVALUATION WAS PERFORMED  SWARTZ,ZACHARY T 03/21/2015, 8:43 AM

## 2015-03-21 NOTE — Progress Notes (Signed)
ANTICOAGULATION CONSULT NOTE - Follow Up Consult  Pharmacy Consult for coumadin Indication: VTE prophylaxis  Allergies  Allergen Reactions  . Statins Other (See Comments)    Messes with liver function  . Codeine     REACTION: nausea  . Tape Rash    Patient Measurements: Height: 5' 10"  (177.8 cm) Weight: 236 lb 3.2 oz (107.14 kg) IBW/kg (Calculated) : 73 Heparin Dosing Weight:   Vital Signs: Temp: 99 F (37.2 C) (10/31 0542) Temp Source: Oral (10/31 0542) BP: 127/60 mmHg (10/31 0542) Pulse Rate: 79 (10/31 0542)  Labs:  Recent Labs  03/19/15 0605 03/20/15 0530 03/21/15 0450  HGB  --   --  9.0*  HCT  --   --  28.3*  PLT  --   --  383  LABPROT 24.1* 23.6* 23.6*  INR 2.18* 2.12* 2.12*  CREATININE  --   --  0.82    Estimated Creatinine Clearance: 115.9 mL/min (by C-G formula based on Cr of 0.82).   Medications:  Scheduled:  . docusate sodium  100 mg Oral BID  . iron polysaccharides  150 mg Oral Daily  . Warfarin - Pharmacist Dosing Inpatient   Does not apply q1800   Infusions:    Assessment: 64 yo male s/p ortho surgery is currently on therapeutic coumadin.  INR today is 2.12 (stable).  Goal of Therapy:  INR 2-3 Monitor platelets by anticoagulation protocol: Yes   Plan:  - coumadin 7.5 mg po x1 - INR in am  Brandun Pinn, Tsz-Yin 03/21/2015,8:03 AM

## 2015-03-21 NOTE — Progress Notes (Signed)
Occupational Therapy Note  Patient Details  Name: [name] MRN: [id number] Date of Birth: 1950-09-29  Today's Date: 03/21/2015 OT Individual Time: 1000-1100 OT Individual Time Calculation (min): 60 min   Pt c/o "charlie horse" in plantar surface of Lt foot; repositioned Individual Therapy  Pt had just completed bathing/dressing tasks with OTR. Pt's kinesio taping required retaping. Kinesio tape removed on BLE/knees and replaced as adjunct to alleviate edema/pain..  Pt also c/o pain along bilateral ITB.  Kinesio tape applied to ITB for pain management.    [name] [facility] 03/21/2015, 11:28 AM

## 2015-03-21 NOTE — Progress Notes (Signed)
Occupational Therapy Session Note  Patient Details  Name: [name] MRN: [id number] Date of Birth: [birth year]  Today's Date: 03/21/2015 OT Individual Time:  -   0900-0945   (45 min)      Short Term Goals: Week 1:  OT Short Term Goal 1 (Week 1): STGs=LTGs secondary to short estimated LOS        Therapeutic Interventions/Progress Updates:     Performed bathing and dressing at shower level.  Pt retrieved clothes and ambulated to shower with SBA   Sat on bench for bath; Stood for peri care using grab bars.  Used LH sponge for bathing. Ambulated out to toilet to dry and dress using bench in bathroom.  Pt needed assist with TEDS with OT getting him to engage in pushing leg out to assist with TEDS.  Pt having left foot pain today and needed closer supervision during functional balance and mobility.  Marland Kitchen He dressed remaining items using reacher and sock aid.  Provided pt with reacher bag for energy conservation. Therapy Documentation Precautions:  Precautions Precautions: Fall Required Braces or Orthoses:  (knee immobilizers d/c'd in acute care) Knee Immobilizer - Right: Discontinue once straight leg raise with < 10 degree lag Knee Immobilizer - Left: Discontinue once straight leg raise with < 10 degree lag Restrictions Weight Bearing Restrictions: Yes RLE Weight Bearing: Weight bearing as tolerated LLE Weight Bearing: Weight bearing as tolerated      Pain: Pain Assessment Pain Assessment: 0-10 Pain Score: 5  Faces Pain Scale: Hurts a little bit Pain Type: Surgical pain Pain Location: Knee Pain Orientation: Right;Left Pain Descriptors / Indicators: Aching Pain Frequency: Intermittent Pain Onset: Gradual Pain Intervention(s): Medication (See eMAR)         See Function Navigator for Current Functional Status.   Therapy/Group: Individual Therapy  Lisa Roca 03/21/2015, 7:45 AM

## 2015-03-21 NOTE — Progress Notes (Signed)
Physical Therapy Session Note  Patient Details  Name: [name] MRN: [id number] Date of Birth: 03/07/1951  Today's Date: 03/21/2015 PT Individual Time: 1100-1138 PT Individual Time Calculation (min): 38 min   Short Term Goals: Week 1:  PT Short Term Goal 1 (Week 1): STGs=LTGs due to short ELOS  Skilled Therapeutic Interventions/Progress Updates:    Patient reports pain in left foot limiting tolerance to ambulation today.  Seen after OT in therapy gym.  Patient transferred to mat stand pivot supervision to minguard due to no device.  Patient sit to supine supervision.  Heat pack applied to plantar surface of left foot while performing SLR with knee press x 10 bilat, sidelying IT band stretch passive with manual mobs to soft tissue.20-30 sec x 3 reps each.  Then soft tissue mobs to plantar and dorsum of left foot including mobs to joints between metatarsals.  Patient seated to perform rolling of left foot over tennis ball.  Then seated stretch to left knee and ankle with focus on heel contact with knee in flexion with 20 second hold.  Patient propelled back to room mod I in wheelchair.  Assisted to bathroom with walker supervision x about 20' antalgic on left.  Left seated in recliner with heat to left foot plantar surface, notified nursing needs ice to both knees.  All needs within reach.  Therapy Documentation Precautions:  Precautions Precautions: Fall Required Braces or Orthoses:  (knee immobilizers d/c'd in acute care) Knee Immobilizer - Right: Discontinue once straight leg raise with < 10 degree lag Knee Immobilizer - Left: Discontinue once straight leg raise with < 10 degree lag Restrictions Weight Bearing Restrictions: Yes RLE Weight Bearing: Weight bearing as tolerated LLE Weight Bearing: Weight bearing as tolerated Pain: Pain Assessment Pain Score: 8  Pain Type: Acute pain Pain Location: Foot Pain Orientation: Left Pain Descriptors / Indicators: Sore Pain Onset: With  Activity Pain Intervention(s): Heat applied;Elevated extremity;Massage   See Function Navigator for Current Functional Status.   Therapy/Group: Individual Therapy  [place], [place] 03/21/2015  03/21/2015, 12:30 PM

## 2015-03-22 ENCOUNTER — Inpatient Hospital Stay (HOSPITAL_COMMUNITY): Payer: Managed Care, Other (non HMO) | Admitting: Physical Therapy

## 2015-03-22 ENCOUNTER — Inpatient Hospital Stay (HOSPITAL_COMMUNITY): Payer: Managed Care, Other (non HMO) | Admitting: Occupational Therapy

## 2015-03-22 LAB — PROTIME-INR
INR: 2.26 — AB (ref 0.00–1.49)
Prothrombin Time: 24.7 seconds — ABNORMAL HIGH (ref 11.6–15.2)

## 2015-03-22 MED ORDER — WARFARIN SODIUM 7.5 MG PO TABS
7.5000 mg | ORAL_TABLET | Freq: Once | ORAL | Status: AC
Start: 2015-03-22 — End: 2015-03-22
  Administered 2015-03-22: 7.5 mg via ORAL
  Filled 2015-03-22: qty 1

## 2015-03-22 NOTE — Discharge Summary (Signed)
NAMETERRIS, GERMANO NO.:  [id number]  MEDICAL RECORD NO.:  [id number]  LOCATION:  4M05C                        FACILITY:  [facility]  PHYSICIAN:  Lauraine Rinne, P.A.  DATE OF BIRTH:  [birth year]  DATE OF ADMISSION:  03/14/2015 DATE OF DISCHARGE:  03/23/2015                              DISCHARGE SUMMARY   DISCHARGE DIAGNOSES: 1. Functional deficits secondary to bilateral total knee arthroplasty     due to osteoarthritis. 2. Coumadin for DVT prophylaxis. 3. Pain management. 4. Constipation resolved. 5. Hyperlipidemia. 6. History of mitral valve prolapse, atrial flutter status post     ablation. 7. Acute blood loss anemia.  HISTORY OF PRESENT ILLNESS:  This is a 64 year old right-handed male, with history of mitral valve prolapse, atrial flutter status post ablation, maintained on aspirin therapy.  Lives with his wife. Presented on March 09, 2015, with end-stage osteoarthritis of bilateral knees, left greater than right.  No relief with conservative care.  Underwent bilateral total knee arthroplasty on March 09, 2015 per Dr. Maureen Ralphs.  Hospital course, pain management.  Weightbearing as tolerated in bilateral lower extremities.  Coumadin for DVT prophylaxis. Acute blood loss anemia 8.3 and monitored.  Physical and occupational therapy ongoing.  The patient was admitted for a comprehensive rehab program.  PAST MEDICAL HISTORY:  See discharge diagnoses.  SOCIAL HISTORY:  Lives with spouse, independent with assistive device prior to admission.  Functional status upon admission to rehab services, minimal assist to ambulate 115 feet rolling walker, minimal guard; supine to sit, moderate assist; sit to supine, min-to-mod assist; activities of daily living.  PHYSICAL EXAMINATION:  VITAL SIGNS:  Blood pressure 148/58, pulse 88, temperature 99, and respirations 18.  This was an alert male, oriented x3. LUNGS:  Clear to auscultation with good breath  sounds. CARDIAC:  Regular rate and rhythm without murmur. ABDOMEN:  Soft, nontender.  Good bowel sounds.  Bilateral knee incisions healing nicely without drainage.  REHABILITATION HOSPITAL COURSE:  The patient was admitted to inpatient rehab services with therapies initiated on a 3-hour daily basis, consisting of physical therapy, occupational therapy, and rehabilitation nursing.  The following issues were addressed during the patient's rehabilitation stay.  Pertaining to Mr. Cybulski  functional deficits secondary to bilateral total knee arthroplasty due to osteoarthritis. Surgical site healing nicely.  He would follow up outpatient orthopedic doctor Dr. Maureen Ralphs.  He was weightbearing as tolerated.  Neurovascular sensation intact.  He remained on Coumadin for DVT prophylaxis.  Latest INR of 2.12.  He would remain on Coumadin until April 09, 2015, did complete DVT prophylaxis and resume aspirin as prior to admission.  Pain management with the use of Robaxin and Ultram and good results.  The patient with bouts of constipation resolved with laxative assistance. Acute blood loss anemia, stable.  Latest hemoglobin 9.0.  No bleeding episodes.  No chest pain or shortness of breath.  He did have a history of mitral valve prolapse, atrial flutter with ablation in the past.  He would resume aspirin therapy after Coumadin completed.  The patient received weekly collaborative interdisciplinary team conferences to discuss estimated length of stay, family teaching, and any barriers to discharge.  The patient performed transfer, recliner,  wheelchair, stand, pivot with rolling walker and supervision.  Perform wheelchair mobility to the therapy Gym modified independence.  The patient transferred to [place] stand pivot supervision.  Performed upper and lower body strengthening.  Performed Glute and mid strengthening and right and left side lying.  Perform stair negotiations with supervision and  verbal cues.  Ambulating extended distances with a rolling walker.  He could gather his belongings for activities of daily living and homemaking. Kinesio tape to bilateral knees.  Full family teaching was completed and plan discharged to home with ongoing therapies dictated per rehab services.  DISCHARGE MEDICATIONS:  Colace 100 mg p.o. b.i.d., Niferex 150 mg p.o. daily, Robaxin 500 mg p.o. every 6 hours as needed muscle spasms, Ultram 50-100 mg every 6 hours as needed moderate pain dispense of 90 tablets, Coumadin latest dose is 7.5 mg adjusted accordingly for an INR of 2.0 to 3.0 with Coumadin ongoing until November 19 and stopped in resuming aspirin at that time.  The patient's diet was regular.  He would follow up Dr. Alger Simons at the Outpatient Rehab Service Office as needed; Dr. Hector Shade 2 weeks call for appointment; Dr. Garnet Koyanagi, medical Management.   Special Instructions.HHRN check INR 03/28/2015 results to [place] [facility] Mansura, P.A.     DA/MEDQ  D:  03/22/2015  T:  03/22/2015  Job:  [id number]  cc:   Rosalita Chessman, DO Meredith Staggers, M.D. Gaynelle Arabian, M.D.

## 2015-03-22 NOTE — Progress Notes (Signed)
Occupational Therapy Discharge Summary and OT Intervention  Patient Details  Name: [name] MRN: [id number] Date of Birth: 1951-05-10  Today's Date: 03/22/2015 OT Individual Time: 3893-7342 and 8768-1157 OT Individual Time Calculation (min): 55 min and 88 min    Patient has met 7 of 7 long term goals due to improved activity tolerance, improved balance and ability to compensate for deficits.  Patient to discharge at overall Modified Independent level.  Patient's care partner is independent to provide the assist as needed.   Reasons goals not met: all goals met  Recommendation:  Patient will benefit from ongoing skilled OT services in home health setting to continue to advance functional skills in the area of BADL and iADL.  Equipment: BSC  Reasons for discharge: treatment goals met  Patient/family agrees with progress made and goals achieved: Yes   OT Intervention: Session 1: Upon entering the room, pt supine in bed with 3/10 c/o pain this session. Pt  Performed supine >sit independently with increased time. Pt ambulated with RW to obtain all needed items for self care tasks with Mod I. Pt performing all overall self care tasks at mod I level with AE as needed such as long handled bath sponge to increase I with bathing. Pt able to don B shoes today and tie laces with increased time. Pt made Mod I in room and is able to ambulate with RW as needed for toileting and any other needs within the room. Pt seated in recliner chair with call bell and all needed items within reach upon exiting the room.   Session 2: Pt seated in recliner chair upon entering the room with 4/10 c/o pain in B knees this session. His wife is present in room for continued family education this session. Skilled OT session with focus on community integration, functional transfers, functional mobility, and education. OT propelled pt downstairs via wheelchair for energy conservation. Pt ambulated on a variety of  surfaces independently with use of RW. Pt walking sideways up and down 5 steps while holding onto rail independently as well with increased time. Pt requiring rest break for fatigue. Focus on utilizing public restroom with use of RW which pt was able to accomplish with Mod I. Also, pt ambulated in gift shop with RW through [place], utilizing RW for side stepping, picking up items upwards and low squat to obtain items from bottom shelves with Mod I. Pt returning to room via wheelchair secondary to fatigue. OT educated caregiver on HHOT recommendation based on decrease endurance. Call bell and all needed items within reach upon exiting the room.   OT Discharge Precautions/Restrictions  Precautions Precautions: Fall Restrictions Weight Bearing Restrictions: Yes RLE Weight Bearing: Weight bearing as tolerated LLE Weight Bearing: Weight bearing as tolerated Vital Signs Therapy Vitals Temp: 99 F (37.2 C) Temp Source: Oral Pulse Rate: 70 Resp: 18 BP: 138/60 mmHg Patient Position (if appropriate): Lying Oxygen Therapy SpO2: 96 % O2 Device: Not Delivered Pain Pain Assessment Pain Assessment: 0-10 Pain Score: 2  Pain Type: Acute pain Pain Location: Knee Pain Orientation: Right;Left Pain Descriptors / Indicators: Aching;Sore Pain Onset: On-going Patients Stated Pain Goal: 0 Pain Intervention(s): Repositioned;Cold applied Multiple Pain Sites: No Vision/Perception  Vision- History Baseline Vision/History: Wears glasses Wears Glasses: Reading only Patient Visual Report: No change from baseline Vision- Assessment Vision Assessment?: No apparent visual deficits  Cognition Overall Cognitive Status: Within Functional Limits for tasks assessed Arousal/Alertness: Awake/alert Orientation Level: Oriented X4 Selective Attention: Appears intact Memory: Appears intact Awareness: Appears  intact Problem Solving: Appears intact Safety/Judgment: Appears intact Sensation Sensation Light Touch:  Appears Intact Stereognosis: Not tested Hot/Cold: Appears Intact Proprioception: Appears Intact Coordination Gross Motor Movements are Fluid and Coordinated: Yes Fine Motor Movements are Fluid and Coordinated: Yes Mobility  Bed Mobility Bed Mobility: Supine to Sit;Sit to Supine Supine to Sit: 6: Modified independent (Device/Increase time) Sit to Supine: 6: Modified independent (Device/Increase time) Transfers Sit to Stand: 6: Modified independent (Device/Increase time) Stand to Sit: 6: Modified independent (Device/Increase time)  Trunk/Postural Assessment  Cervical Assessment Cervical Assessment: Within Functional Limits Thoracic Assessment Thoracic Assessment: Within Functional Limits Lumbar Assessment Lumbar Assessment: Within Functional Limits  Balance Static Sitting Balance Static Sitting - Balance Support: No upper extremity supported;Feet supported Static Sitting - Level of Assistance: 6: Modified independent (Device/Increase time) Dynamic Sitting Balance Dynamic Sitting - Balance Support: No upper extremity supported;During functional activity Dynamic Sitting - Level of Assistance: 6: Modified independent (Device/Increase time) Dynamic Sitting - Balance Activities: Lateral lean/weight shifting;Forward lean/weight shifting Static Standing Balance Static Standing - Balance Support: Bilateral upper extremity supported Static Standing - Level of Assistance: 6: Modified independent (Device/Increase time) Dynamic Standing Balance Dynamic Standing - Balance Support: Right upper extremity supported;Left upper extremity supported;During functional activity Dynamic Standing - Level of Assistance: 6: Modified independent (Device/Increase time) Dynamic Standing - Balance Activities: Lateral lean/weight shifting;Forward lean/weight shifting;Reaching for objects;Reaching across midline Extremity/Trunk Assessment RUE Assessment RUE Assessment: Within Functional Limits LUE  Assessment LUE Assessment: Within Functional Limits   See Function Navigator for Current Functional Status.  Phineas Semen 03/22/2015, 8:00 AM

## 2015-03-22 NOTE — Plan of Care (Signed)
Problem: RH PAIN MANAGEMENT Goal: RH STG PAIN MANAGED AT OR BELOW PT'S PAIN GOAL Pt will manage pain at 4 or less on a scale of 0-10.  Outcome: Not Progressing Rates pain 6/10

## 2015-03-22 NOTE — Discharge Summary (Signed)
Discharge summary job [id number]

## 2015-03-22 NOTE — Progress Notes (Signed)
Physical Therapy Discharge Summary  Patient Details  Name: [name] MRN: [id number] Date of Birth: Oct 22, 1950  Today's Date: 03/22/2015 PT Individual Time: 1100-1200 PT Individual Time Calculation (min): 60 min    Patient has met 8 of 8 long term goals due to improved activity tolerance, improved balance, improved postural control, increased strength, increased range of motion, decreased pain and decreased swelling.  Patient to discharge at an ambulatory level Modified Independent.   Patient's care partner is independent to provide the necessary assistance at discharge.   Recommendation:  Patient will benefit from ongoing skilled PT services in outpatient setting to continue to advance safe functional mobility, address ongoing impairments in ROM, strength, balance, activity tolerance, and minimize fall risk.  Equipment: No equipment provided  Reasons for discharge: treatment goals met  Patient/family agrees with progress made and goals achieved: Yes   Skilled Therapeutic Intervention: Pt received in recliner and agreeable to therapy session. Session focus on reassessment of transfers, gait, bed mobility, ROM, strength, and pt education for HEP and k-tape.  Pt performing all mobility mod I with RW.  Pt performed 10 min on Nustep at level 6 for endurance, and increased ROM.  PT reviewed HEP, pt able to recall 6 exercises out of 13 from HEP.  PT reviewed with patient and wife application of k-tape (did not reapply as OT reapplied yesterday).  Pt returned to room at end of session and left with call bell in reach and needs met.   PT Discharge Precautions/Restrictions Precautions Precautions: Fall Restrictions Weight Bearing Restrictions: Yes RLE Weight Bearing: Weight bearing as tolerated LLE Weight Bearing: Weight bearing as tolerated Vital Signs   Pain Pain Assessment Pain Assessment: 0-10 Pain Score: 3  Pain Type: Acute pain Pain Location: Knee Pain Orientation:  Right;Left Pain Descriptors / Indicators: Aching Pain Onset: Gradual Pain Intervention(s): Emotional support    Cognition Overall Cognitive Status: Within Functional Limits for tasks assessed Arousal/Alertness: Awake/alert Orientation Level: Oriented X4  Motor  Motor Motor: Within Functional Limits  Mobility Bed Mobility Bed Mobility: Supine to Sit;Sit to Supine Supine to Sit: 6: Modified independent (Device/Increase time) Sit to Supine: 6: Modified independent (Device/Increase time) Transfers Transfers: Yes Sit to Stand: 6: Modified independent (Device/Increase time) Stand to Sit: 6: Modified independent (Device/Increase time) Stand Pivot Transfers: 6: Modified independent (Device/Increase time) Locomotion  Ambulation Ambulation: Yes Ambulation/Gait Assistance: 6: Modified independent (Device/Increase time) Ambulation Distance (Feet): 200 Feet Assistive device: Rolling walker Gait Gait: Yes Gait Pattern: Impaired Gait Pattern: Decreased step length - right;Decreased step length - left;Decreased hip/knee flexion - right;Decreased hip/knee flexion - left;Step-through pattern Stairs / Additional Locomotion Stairs: Yes Stairs Assistance: 6: Modified independent (Device/Increase time) Stair Management Technique: One rail Left;Sideways Number of Stairs: 4 Height of Stairs: 6 Wheelchair Mobility Wheelchair Mobility: No  Trunk/Postural Assessment  Cervical Assessment Cervical Assessment: Within Functional Limits Thoracic Assessment Thoracic Assessment: Within Functional Limits Lumbar Assessment Lumbar Assessment: Within Functional Limits  Balance Balance Balance Assessed: Yes Dynamic Sitting Balance Dynamic Sitting - Balance Support: No upper extremity supported;During functional activity Dynamic Sitting - Level of Assistance: 6: Modified independent (Device/Increase time) Dynamic Sitting - Balance Activities: Lateral lean/weight shifting;Forward lean/weight  shifting Static Standing Balance Static Standing - Balance Support: No upper extremity supported Static Standing - Level of Assistance: 6: Modified independent (Device/Increase time) Dynamic Standing Balance Dynamic Standing - Balance Support: During functional activity;No upper extremity supported Dynamic Standing - Level of Assistance: 6: Modified independent (Device/Increase time) Dynamic Standing - Balance Activities: Reaching across  midline;Reaching for weighted objects Dynamic Standing - Comments: moving tray table  Extremity Assessment      RLE AROM (degrees) Right Knee Extension: 0 Right Knee Flexion: 99 RLE Strength RLE Overall Strength Comments: hip flexion 4/5, knee extension 4/5, knee flexion 4/5, DF/PF 3+/5 LLE AROM (degrees) Left Knee Extension: 0 Left Knee Flexion: 96 LLE Strength LLE Overall Strength Comments: hip flexion 4/5, knee extension 4/5, knee flexion 3+/5, DF/PF 3+/5   See Function Navigator for Current Functional Status.  [name] 03/22/2015, 12:05 PM

## 2015-03-22 NOTE — Progress Notes (Signed)
ANTICOAGULATION CONSULT NOTE - Follow Up Consult  Pharmacy Consult for coumadin Indication: VTE prophylaxis  Allergies  Allergen Reactions  . Statins Other (See Comments)    Messes with liver function  . Codeine     REACTION: nausea  . Tape Rash    Patient Measurements: Height: 5' 10"  (177.8 cm) Weight: 236 lb 3.2 oz (107.14 kg) IBW/kg (Calculated) : 73 Heparin Dosing Weight:   Vital Signs: Temp: 99 F (37.2 C) (11/01 0518) Temp Source: Oral (11/01 0518) BP: 138/60 mmHg (11/01 0518) Pulse Rate: 70 (11/01 0518)  Labs:  Recent Labs  03/20/15 0530 03/21/15 0450 03/22/15 0558  HGB  --  9.0*  --   HCT  --  28.3*  --   PLT  --  383  --   LABPROT 23.6* 23.6* 24.7*  INR 2.12* 2.12* 2.26*  CREATININE  --  0.82  --     Estimated Creatinine Clearance: 115.9 mL/min (by C-G formula based on Cr of 0.82).   Medications:  Scheduled:  . docusate sodium  100 mg Oral BID  . iron polysaccharides  150 mg Oral Daily  . Warfarin - Pharmacist Dosing Inpatient   Does not apply q1800   Infusions:    Assessment: 64 yo male s/p ortho surgery is currently on therapeutic coumadin.  INR today is 2.26.   Goal of Therapy:  INR 2-3 Monitor platelets by anticoagulation protocol: Yes   Plan:  - coumadin 7.5 mg po x1 - INR in am  Lanina Larranaga, Tsz-Yin 03/22/2015,8:11 AM

## 2015-03-22 NOTE — Progress Notes (Signed)
Subjective/Complaints: Had another good day. Feels prepared for dc.   Having some tightness/discomfort in lateral thighs and feet.  ROS:  Denies CP, SOB, n/v/d. +anxious  Objective: Vital Signs: Blood pressure 138/60, pulse 70, temperature 99 F (37.2 C), temperature source Oral, resp. rate 18, height _0  (1.778 m), weight 107.14 kg (236 lb 3.2 oz), SpO2 96 %. No results found. Results for orders placed or performed during the hospital encounter of 03/14/15 (from the past 72 hour(s))  Protime-INR     Status: Abnormal   Collection Time: 03/20/15  5:30 AM  Result Value Ref Range   Prothrombin Time 23.6 (H) 11.6 - 15.2 seconds   INR 2.12 (H) 0.00 - 1.49  Protime-INR     Status: Abnormal   Collection Time: 03/21/15  4:50 AM  Result Value Ref Range   Prothrombin Time 23.6 (H) 11.6 - 15.2 seconds   INR 2.12 (H) 0.00 - 1.49  CBC     Status: Abnormal   Collection Time: 03/21/15  4:50 AM  Result Value Ref Range   WBC 8.3 4.0 - 10.5 K/uL   RBC 3.03 (L) 4.22 - 5.81 MIL/uL   Hemoglobin 9.0 (L) 13.0 - 17.0 g/dL   HCT 28.3 (L) 39.0 - 52.0 %   MCV 93.4 78.0 - 100.0 fL   MCH 29.7 26.0 - 34.0 pg   MCHC 31.8 30.0 - 36.0 g/dL   RDW 14.5 11.5 - 15.5 %   Platelets 383 150 - 400 K/uL  Basic metabolic panel     Status: Abnormal   Collection Time: 03/21/15  4:50 AM  Result Value Ref Range   Sodium 133 (L) 135 - 145 mmol/L   Potassium 3.9 3.5 - 5.1 mmol/L   Chloride 102 101 - 111 mmol/L   CO2 24 22 - 32 mmol/L   Glucose, Bld 111 (H) 65 - 99 mg/dL   BUN 12 6 - 20 mg/dL   Creatinine, Ser 0.82 0.61 - 1.24 mg/dL   Calcium 8.6 (L) 8.9 - 10.3 mg/dL   GFR calc non Af Amer >60 >60 mL/min   GFR calc Af Amer >60 >60 mL/min    Comment: (NOTE) The eGFR has been calculated using the CKD EPI equation. This calculation has not been validated in all clinical situations. eGFR's persistently <60 mL/min signify possible Chronic Kidney Disease.    Anion gap 7 5 - 15  Protime-INR     Status: Abnormal   Collection Time: 03/22/15  5:58 AM  Result Value Ref Range   Prothrombin Time 24.7 (H) 11.6 - 15.2 seconds   INR 2.26 (H) 0.00 - 1.49     Gen NAD. Vital signs reviewed.  HEENT: Normocephalic, atraumatic Cardio: RRR and no murmur Resp: CTA B/L and unlabored GI: BS positive and NT, ND Musc/Skel:  Tender b/l knees, knee flexion 90 degrees bilaterally. Knee ROM 90 + Edema throughout LE with improving- -bruising improved as well..  Strength: 4- to 4 bilateral HF (limitied due to pain), 5/5 distally at ankles Neuro: Alert/Oriented,  Skin:   Drying maculopapular rash on back almost resolved.  Knee incisiton CDI Psych: anxious  Assessment/Plan: 1. 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 Physiatrist is providing close team supervision and 24 hour management of active medical problems listed below. Physiatrist and rehab team continue to assess barriers to discharge/monitor patient progress toward functional and medical goals. FIM: Function -  Bathing Position: Shower Body parts bathed by patient: Right arm, Left arm, Chest, Abdomen, Front perineal area, Buttocks, Right upper leg, Left upper leg, Right lower leg, Left lower leg Body parts bathed by helper: Back Bathing not applicable: Back Assist Level: More than reasonable time  Function- Upper Body Dressing/Undressing What is the patient wearing?: Pull over shirt/dress Pull over shirt/dress - Perfomed by patient: Thread/unthread right sleeve, Thread/unthread left sleeve, Put head through opening, Pull shirt over trunk Assist Level: More than reasonable time Function - Lower Body Dressing/Undressing What is the patient wearing?: Pants, Underwear, Non-skid slipper socks Position: Sitting EOB Underwear - Performed by patient: Thread/unthread right underwear leg,  Thread/unthread left underwear leg, Pull underwear up/down Pants- Performed by patient: Thread/unthread right pants leg, Pull pants up/down, Fasten/unfasten pants, Thread/unthread left pants leg Non-skid slipper socks- Performed by patient: Don/doff right sock, Don/doff left sock Non-skid slipper socks- Performed by helper: Don/doff right sock, Don/doff left sock Socks - Performed by patient: Don/doff right sock, Don/doff left sock Shoes - Performed by patient: Don/doff right shoe, Don/doff left shoe Shoes - Performed by helper: Fasten right, Fasten left TED Hose - Performed by helper: Don/doff right TED hose, Don/doff left TED hose Assist for footwear: Independent Assist for lower body dressing: More than reasonable time  Function - Toileting Toileting steps completed by patient: Adjust clothing prior to toileting, Performs perineal hygiene, Adjust clothing after toileting Toileting Assistive Devices: Grab bar or rail Assist level: More than reasonable time  Function - Air cabin crew transfer assistive device: Walker Assist level to toilet: No Help, no cues, assistive device, takes more than a reasonable amount of time Assist level from toilet: No Help, no cues, assistive device, takes more than a reasonable amount of time  Function - Chair/bed transfer Chair/bed transfer method: Ambulatory Chair/bed transfer assist level: No Help, no cues, assistive device, takes more than a reasonable amount of time Chair/bed transfer assistive device: Walker Chair/bed transfer details: Verbal cues for safe use of DME/AE  Function - Locomotion: Wheelchair Type: Manual Max wheelchair distance: 150 Assist Level: No help, No cues, assistive device, takes more than reasonable amount of time Assist Level: No help, No cues, assistive device, takes more than reasonable amount of time Assist Level: No help, No cues, assistive device, takes more than reasonable amount of time Turns  around,maneuvers to table,bed, and toilet,negotiates 3% grade,maneuvers on rugs and over doorsills: Yes Function - Locomotion: Ambulation Assistive device: Walker-rolling Max distance: 20 Assist level: Supervision or verbal cues Assist level: Supervision or verbal cues Assist level: Supervision or verbal cues Assist level: Supervision or verbal cues Walk 10 feet on uneven surfaces activity did not occur: Safety/medical concerns Assist level: Touching or steadying assistance (Pt > 75%) (uneven surface min assist per Georjean Mode, PT note)  Function - Comprehension Comprehension: Auditory Comprehension assist level: Follows complex conversation/direction with no assist  Function - Expression Expression: Verbal Expression assist level: Expresses complex ideas: With no assist  Function - Social Interaction Social Interaction assist level: Interacts appropriately with others - No medications needed.  Function - Problem Solving Problem solving assist level: Solves complex problems: Recognizes & self-corrects  Function - Memory Memory assist level: Complete Independence: No helper Patient normally able to recall (first 3 days only): Current season, Location of own room, Staff names and faces, That he or she is in a hospital  Medical Problem List and Plan: 1. 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  DVT Prophylaxis/Anticoagulation: Coumadin therapeutic.   Continue for 30 days post-op 3. Pain Management: Robaxin and Ultram as needed. Monitor with increased mobility 4. Constipation. Dc'ed movantik per patient request--continue colace   5. Neuropsych: This patient is capable of making decisions on his own behalf. 6. Skin/Wound Care: edema gradually improving  -lasix given x 2 last week  -aggressive ice, elevation, k-tape also should continue  -sarna for pruritus has helped 7.  Fluids/Electrolytes/Nutrition: Routine I&O .   8.Mitral valve prolapse/ atrial flutter status post ablation. Resume aspirin after Coumadin completed 9. Hyperlipidemia.Lovaza (hold) patient refusing right now---can resume once off coumadin 10.ABLA. Continue iron supplement. Follow-up CBC: hgb 9.0    11. Hyponatremia: 134 friday  LOS (Days) 8 A FACE TO FACE EVALUATION WAS PERFORMED  [name] T 03/22/2015, 8:38 AM

## 2015-03-23 LAB — PROTIME-INR
INR: 2.34 — ABNORMAL HIGH (ref 0.00–1.49)
PROTHROMBIN TIME: 25.4 s — AB (ref 11.6–15.2)

## 2015-03-23 MED ORDER — POLYSACCHARIDE IRON COMPLEX 150 MG PO CAPS: 150.0000 mg | ORAL_CAPSULE | Freq: Every day | ORAL | Status: DC

## 2015-03-23 MED ORDER — METHOCARBAMOL 500 MG PO TABS: 500.0000 mg | ORAL_TABLET | Freq: Four times a day (QID) | ORAL | Status: DC | PRN

## 2015-03-23 MED ORDER — WARFARIN SODIUM 5 MG PO TABS: 7.5000 mg | ORAL_TABLET | Freq: Every day | ORAL | Status: DC

## 2015-03-23 MED ORDER — WARFARIN SODIUM 7.5 MG PO TABS: 7.5000 mg | ORAL_TABLET | Freq: Once | ORAL | Status: DC

## 2015-03-23 MED ORDER — TRAMADOL HCL 50 MG PO TABS: 50.0000 mg | ORAL_TABLET | Freq: Four times a day (QID) | ORAL | Status: DC | PRN

## 2015-03-23 NOTE — Progress Notes (Signed)
ANTICOAGULATION CONSULT NOTE - Follow Up Consult  Pharmacy Consult for coumadin Indication: VTE prophylaxis  Allergies  Allergen Reactions  . Statins Other (See Comments)    Messes with liver function  . Codeine     REACTION: nausea  . Tape Rash    Patient Measurements: Height: 5' 10"  (177.8 cm) Weight: 236 lb 3.2 oz (107.14 kg) IBW/kg (Calculated) : 73 Heparin Dosing Weight:   Vital Signs: Temp: 99 F (37.2 C) (11/02 0520) Temp Source: Oral (11/02 0520) BP: 128/66 mmHg (11/02 0520) Pulse Rate: 69 (11/02 0520)  Labs:  Recent Labs  03/21/15 0450 03/22/15 0558 03/23/15 0409  HGB 9.0*  --   --   HCT 28.3*  --   --   PLT 383  --   --   LABPROT 23.6* 24.7* 25.4*  INR 2.12* 2.26* 2.34*  CREATININE 0.82  --   --     Estimated Creatinine Clearance: 115.9 mL/min (by C-G formula based on Cr of 0.82).   Medications:  Scheduled:  . docusate sodium  100 mg Oral BID  . iron polysaccharides  150 mg Oral Daily  . warfarin  7.5 mg Oral ONCE-1800  . Warfarin - Pharmacist Dosing Inpatient   Does not apply q1800   Infusions:    Assessment: 64 yo male s/o ortho surgery is currently on therapeutic coumadin.  INR today is 2.34.  Goal of Therapy:  INR 2-3 Monitor platelets by anticoagulation protocol: Yes   Plan:  - if discharge today, rec to continue coumadin 7.5 mg po daily and recheck INR this Friday to reassess dosing  Amaad Byers, Tsz-Yin 03/23/2015,8:20 AM

## 2015-03-23 NOTE — Progress Notes (Signed)
Social Work  Discharge Note  The overall goal for the admission was met for:   Discharge location: Yes - home with wife who can provide 24/7 assistance  Length of Stay: Yes - 9 days  Discharge activity level: Yes - mod independent  Home/community participation: Yes  Services provided included: MD, RD, PT, OT, RN, Pharmacy and [place]: Private Insurance: Airline pilot  Follow-up services arranged: Home Health: Therapist, sports, PT via [facility], DME: bedside commode via [place] and Patient/Family has no preference for HH/DME agencies  Comments (or additional information):  Patient/Family verbalized understanding of follow-up arrangements: Yes  Individual responsible for coordination of the follow-up plan: pt  Confirmed correct DME delivered: [name] 03/23/2015    [name]

## 2015-03-23 NOTE — Progress Notes (Addendum)
Subjective/Complaints: Feels prepared to go home. Pain under good control.  ROS:  Denies CP, SOB, n/v/d. +anxious  Objective: Vital Signs: Blood pressure 128/66, pulse 69, temperature 99 F (37.2 C), temperature source Oral, resp. rate 18, height 5' 10"  (1.778 m), weight 107.14 kg (236 lb 3.2 oz), SpO2 98 %. No results found. Results for orders placed or performed during the hospital encounter of 03/14/15 (from the past 72 hour(s))  Protime-INR     Status: Abnormal   Collection Time: 03/21/15  4:50 AM  Result Value Ref Range   Prothrombin Time 23.6 (H) 11.6 - 15.2 seconds   INR 2.12 (H) 0.00 - 1.49  CBC     Status: Abnormal   Collection Time: 03/21/15  4:50 AM  Result Value Ref Range   WBC 8.3 4.0 - 10.5 K/uL   RBC 3.03 (L) 4.22 - 5.81 MIL/uL   Hemoglobin 9.0 (L) 13.0 - 17.0 g/dL   HCT 28.3 (L) 39.0 - 52.0 %   MCV 93.4 78.0 - 100.0 fL   MCH 29.7 26.0 - 34.0 pg   MCHC 31.8 30.0 - 36.0 g/dL   RDW 14.5 11.5 - 15.5 %   Platelets 383 150 - 400 K/uL  Basic metabolic panel     Status: Abnormal   Collection Time: 03/21/15  4:50 AM  Result Value Ref Range   Sodium 133 (L) 135 - 145 mmol/L   Potassium 3.9 3.5 - 5.1 mmol/L   Chloride 102 101 - 111 mmol/L   CO2 24 22 - 32 mmol/L   Glucose, Bld 111 (H) 65 - 99 mg/dL   BUN 12 6 - 20 mg/dL   Creatinine, Ser 0.82 0.61 - 1.24 mg/dL   Calcium 8.6 (L) 8.9 - 10.3 mg/dL   GFR calc non Af Amer >60 >60 mL/min   GFR calc Af Amer >60 >60 mL/min    Comment: (NOTE) The eGFR has been calculated using the CKD EPI equation. This calculation has not been validated in all clinical situations. eGFR's persistently <60 mL/min signify possible Chronic Kidney Disease.    Anion gap 7 5 - 15  Protime-INR     Status: Abnormal   Collection Time: 03/22/15  5:58 AM  Result Value Ref Range   Prothrombin Time 24.7 (H) 11.6 - 15.2 seconds   INR 2.26 (H) 0.00 - 1.49  Protime-INR     Status: Abnormal   Collection Time: 03/23/15  4:09 AM  Result Value Ref  Range   Prothrombin Time 25.4 (H) 11.6 - 15.2 seconds   INR 2.34 (H) 0.00 - 1.49     Gen NAD. Vital signs reviewed.  HEENT: Normocephalic, atraumatic Cardio: RRR and no murmur Resp: CTA B/L and unlabored GI: BS positive and NT, ND Musc/Skel:  Tender b/l knees, knee flexion 90 degrees bilaterally. Knee ROM 90 + Edema throughout LE with improving- -bruising improved as well..  Strength: 4- to 4 bilateral HF (limitied due to pain), 5/5 distally at ankles Neuro: Alert/Oriented,  Skin:   Drying maculopapular rash on back almost resolved.  Knee incisiton CDI Psych: anxious  Assessment/Plan: 1. 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 Physiatrist is providing close team supervision and 24 hour management of active medical problems listed below. Physiatrist and rehab team continue to assess barriers to discharge/monitor patient progress toward functional and medical goals. FIM: Function - Bathing Position: Shower Body parts bathed by  patient: Right arm, Left arm, Chest, Abdomen, Front perineal area, Buttocks, Right upper leg, Left upper leg, Right lower leg, Left lower leg Body parts bathed by helper: Back Bathing not applicable: Back Assist Level: More than reasonable time  Function- Upper Body Dressing/Undressing What is the patient wearing?: Pull over shirt/dress Pull over shirt/dress - Perfomed by patient: Thread/unthread right sleeve, Thread/unthread left sleeve, Put head through opening, Pull shirt over trunk Assist Level: More than reasonable time Function - Lower Body Dressing/Undressing What is the patient wearing?: Pants, Underwear, Non-skid slipper socks Position: Sitting EOB Underwear - Performed by patient: Thread/unthread right underwear leg, Thread/unthread left underwear leg, Pull underwear up/down Pants-  Performed by patient: Thread/unthread right pants leg, Pull pants up/down, Fasten/unfasten pants, Thread/unthread left pants leg Non-skid slipper socks- Performed by patient: Don/doff right sock, Don/doff left sock Non-skid slipper socks- Performed by helper: Don/doff right sock, Don/doff left sock Socks - Performed by patient: Don/doff right sock, Don/doff left sock Shoes - Performed by patient: Don/doff right shoe, Don/doff left shoe Shoes - Performed by helper: Fasten right, Fasten left TED Hose - Performed by helper: Don/doff right TED hose, Don/doff left TED hose Assist for footwear: Independent Assist for lower body dressing: More than reasonable time  Function - Toileting Toileting steps completed by patient: Adjust clothing prior to toileting, Performs perineal hygiene, Adjust clothing after toileting Toileting Assistive Devices: Grab bar or rail Assist level: More than reasonable time  Function - Air cabin crew transfer assistive device: Walker Assist level to toilet: No Help, no cues, assistive device, takes more than a reasonable amount of time Assist level from toilet: No Help, no cues, assistive device, takes more than a reasonable amount of time  Function - Chair/bed transfer Chair/bed transfer method: Ambulatory Chair/bed transfer assist level: No Help, no cues, assistive device, takes more than a reasonable amount of time Chair/bed transfer assistive device: Walker Chair/bed transfer details: Verbal cues for safe use of DME/AE  Function - Locomotion: Wheelchair Type: Manual Max wheelchair distance: 150 Assist Level: No help, No cues, assistive device, takes more than reasonable amount of time Assist Level: No help, No cues, assistive device, takes more than reasonable amount of time Assist Level: No help, No cues, assistive device, takes more than reasonable amount of time Turns around,maneuvers to table,bed, and toilet,negotiates 3% grade,maneuvers on rugs  and over doorsills: Yes Function - Locomotion: Ambulation Assistive device: Walker-rolling Max distance: 200 Assist level: No help, No cues, assistive device, takes more than a reasonable amount of time Assist level: No help, No cues, assistive device, takes more than a reasonable amount of time Assist level: No help, No cues, assistive device, takes more than a reasonable amount of time Assist level: No help, No cues, assistive device, takes more than a reasonable amount of time Walk 10 feet on uneven surfaces activity did not occur: Safety/medical concerns Assist level: No help, No cues, assistive device, takes more than a reasonable amount of time  Function - Comprehension Comprehension: Auditory Comprehension assist level: Follows complex conversation/direction with no assist  Function - Expression Expression: Verbal Expression assist level: Expresses complex ideas: With no assist  Function - Social Interaction Social Interaction assist level: Interacts appropriately with others - No medications needed.  Function - Problem Solving Problem solving assist level: Solves complex problems: Recognizes & self-corrects  Function - Memory Memory assist level: Complete Independence: No helper Patient normally able to recall (first 3 days only): Location of own room, Current season, Staff names and faces,  That he or she is in a hospital  Medical Problem List and Plan: 1. Functional deficits secondary to bilateral total knee arthroplasty secondary to end-stage osteoarthritis  -dc home with hh therapies and rn for coumadin  -sees ortho tomorrow 2.  DVT Prophylaxis/Anticoagulation: Coumadin therapeutic.   Continue for 30 days post-op 3. Pain Management: Robaxin and Ultram as needed. Monitor with increased mobility 4. Constipation. Dc'ed movantik per patient request--continue colace   5. Neuropsych: This patient is capable of making decisions on his own behalf. 6. Skin/Wound Care: edema  gradually improving  -ongoing mgt at home---swelling continues to improve 7. Fluids/Electrolytes/Nutrition: Routine I&O .   8.Mitral valve prolapse/ atrial flutter status post ablation. Resume aspirin after Coumadin completed 9. Hyperlipidemia.Lovaza (hold) patient refusing right now---can resume once off coumadin 10.ABLA. Continue iron supplement. Follow-up CBC: hgb 9.0    11. Hyponatremia: resolved  LOS (Days) 9 A FACE TO FACE EVALUATION WAS PERFORMED  [name] T 03/23/2015, 8:48 AM

## 2015-03-23 NOTE — Patient Care Conference (Signed)
Inpatient RehabilitationTeam Conference and Plan of Care Update Date: 03/22/2015   Time: 2:30 PM    Patient Name: [name]      Medical Record Number: [id number]  Date of Birth: 04-Oct-1950 Sex: Male         Room/Bed: 4M05C/4M05C-01 Payor Info: Payor: Holland Falling / Plan: Alwyn Pea / Product Type: *No Product type* /    Admitting Diagnosis: b tkr  Admit Date/Time:  03/14/2015  5:15 PM Admission Comments: No comment available   Primary Diagnosis:  Status post bilateral knee replacements Principal Problem: Status post bilateral knee replacements  Patient Active Problem List   Diagnosis Date Noted  . Postoperative anemia due to acute blood loss 03/19/2015  . Status post bilateral knee replacements 03/14/2015  . OA (osteoarthritis) of knee 03/09/2015  . Pre-op examination 09/17/2014  . S/P mitral valve repair 03/18/2013  . Obesity (BMI 30-39.9) 02/24/2013  . HAND PAIN, RIGHT 06/05/2010  . CAD 12/28/2009  . CAROTID STENOSIS 12/28/2009  . CEREBROVASCULAR DISEASE 12/28/2009  . PRURITUS ANI 05/26/2009  . HYPERLIPIDEMIA, FAMILIAL 04/26/2009  . DEGENERATIVE JOINT DISEASE, KNEES, BILATERAL 09/27/2008  . FATTY LIVER DISEASE 12/29/2007  . ABNORMAL TRANSAMINASE-LFT'S 12/29/2007  . COLONIC POLYPS, ADENOMATOUS, HX OF 12/25/2007  . 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 FEVER, HX OF 12/22/2007  . MITRAL REGURGITATION, SEVERE 03/19/2007  . HEMOCCULT POSITIVE STOOL 02/03/2007  . SYSTOLIC MURMUR 99/24/2683    Expected Discharge Date: Expected Discharge Date: 03/23/15  Team Members Present: Physician leading conference: Dr. Alger Simons Social Worker Present: Lennart Pall, LCSW Nurse Present: Heather Roberts, RN PT Present: Dwyane Dee, Cecille Rubin, PT OT Present: Willeen Cass, Rhetta Mura, OT;Roanna Epley, COTA SLP Present: Weston Anna, SLP PPS Coordinator present : Daiva Nakayama, RN, CRRN     Current Status/Progress Goal Weekly Team Focus  Medical   knee pain beter. swelling  decreased. hgb rising  increase ROM, decrease swelling  anemia, bruising, edema mgt   Bowel/Bladder   Continent of bowel and bladder; LBM 03/20/15  remain continent of bowel and bladder with min assist.   Encourage use of stool softeners r/t pain medications   Swallow/Nutrition/ Hydration             ADL's   Mod I overall   Mod I overall  pt/family education, d/c education   Mobility   mod I  mod I  grad day Tuesday, d/c Wednesday, pt education, HEP   Communication             Safety/Cognition/ Behavioral Observations            Pain   Pt. c/o of bilateral knee pain. 16m Ultram q 6hr and 5061mRobaxin q 6hr. Ice packs used imtermittently  Pt will rate pain at 4 or less on a scale of 0-10.   Assess pain q 4hr and prn. Apply ice to bilateral knees as needed   Skin   Bilateral knee incisions with steri strips. Edema and bruising to bilateral knees  Remain free of skin breakdown and infection.   Assess skin q shift and prn    Rehab Goals Patient on target to meet rehab goals: Yes *See Care Plan and progress notes for long and short-term goals.  Barriers to Discharge: pain, swelling    Possible Resolutions to Barriers:  ktape, elevation, diuresis, ice, adaptive equipment    Discharge Planning/Teaching Needs:  Home with wife who can provide 24/7 assistance.  Teaching completed with wife.   Team Discussion:  Made modified independent in  the room.  Ready for d/c tomorrow.  Family ed being compleged with wife.  Recommend HH to start.  Revisions to Treatment Plan:  None   Continued Need for Acute Rehabilitation Level of Care: The patient requires daily medical management by a physician with specialized training in physical medicine and rehabilitation for the following conditions: Daily direction of a multidisciplinary physical rehabilitation program to ensure safe treatment while eliciting the highest outcome that is of practical value to the patient.: Yes Daily medical  management of patient stability for increased activity during participation in an intensive rehabilitation regime.: Yes Daily analysis of laboratory values and/or radiology reports with any subsequent need for medication adjustment of medical intervention for : Post surgical problems;Other  [name] 03/23/2015, 9:21 AM

## 2015-03-23 NOTE — Discharge Instructions (Signed)
Inpatient Rehab Discharge Instructions  [name] Discharge date and time: No discharge date for patient encounter.   Activities/Precautions/ Functional Status: Activity: activity as tolerated Diet: regular diet Wound Care: keep wound clean and dry Functional status:  ___ No restrictions     ___ Walk up steps independently ___ 24/7 supervision/assistance   ___ Walk up steps with assistance ___ Intermittent supervision/assistance  ___ Bathe/dress independently ___ Walk with walker     ___ Bathe/dress with assistance ___ Walk Independently    ___ Shower independently _x__ Walk with assistance    ___ Shower with assistance ___ No alcohol     ___ Return to work/school ________    COMMUNITY REFERRALS UPON DISCHARGE:    Home Health:   PT      RN                     Agency:  [facility] Phone: [phone]   Medical Equipment/Items Ordered:  Bedside commode                                                      Agency/Supplier:  [facility] @ [phone]       Special Instructions: Continue Coumadin until 04/09/2015 for DVT prophylaxis then resume aspirin 81 mg daily  Home Health RN check INR Monday November 7 results to [facility] [phone]   My questions have been answered and I understand these instructions. I will adhere to these goals and the provided educational materials after my discharge from the hospital.  Patient/Caregiver Signature _______________________________ Date __________  Clinician Signature _______________________________________ Date __________  Please bring this form and your medication list with you to all your follow-up doctor's appointments.

## 2015-03-23 NOTE — Progress Notes (Signed)
Patient given discharge information by Helyn Numbers PA, all questions answered. All belongings packed by patient's wife, and assisted by to car by nurse tech. [place]

## 2015-03-28 ENCOUNTER — Ambulatory Visit: Payer: Managed Care, Other (non HMO) | Admitting: Physical Therapy

## 2015-04-01 ENCOUNTER — Ambulatory Visit (INDEPENDENT_AMBULATORY_CARE_PROVIDER_SITE_OTHER): Payer: Managed Care, Other (non HMO) | Admitting: Family Medicine

## 2015-04-01 ENCOUNTER — Encounter: Payer: Self-pay | Admitting: Family Medicine

## 2015-04-01 VITALS — BP 124/80 | HR 83 | Temp 97.8°F | Wt 216.2 lb

## 2015-04-01 DIAGNOSIS — D62 Acute posthemorrhagic anemia: Secondary | ICD-10-CM | POA: Diagnosis not present

## 2015-04-01 DIAGNOSIS — Z96653 Presence of artificial knee joint, bilateral: Secondary | ICD-10-CM | POA: Diagnosis not present

## 2015-04-01 DIAGNOSIS — R350 Frequency of micturition: Secondary | ICD-10-CM | POA: Diagnosis not present

## 2015-04-01 DIAGNOSIS — K760 Fatty (change of) liver, not elsewhere classified: Secondary | ICD-10-CM

## 2015-04-01 DIAGNOSIS — Z9889 Other specified postprocedural states: Secondary | ICD-10-CM

## 2015-04-01 LAB — CBC WITH DIFFERENTIAL/PLATELET
BASOS ABS: 0 10*3/uL (ref 0.0–0.1)
Basophils Relative: 0.6 % (ref 0.0–3.0)
EOS ABS: 0.3 10*3/uL (ref 0.0–0.7)
Eosinophils Relative: 4.2 % (ref 0.0–5.0)
HCT: 37.1 % — ABNORMAL LOW (ref 39.0–52.0)
HEMOGLOBIN: 12 g/dL — AB (ref 13.0–17.0)
LYMPHS ABS: 1.5 10*3/uL (ref 0.7–4.0)
Lymphocytes Relative: 25 % (ref 12.0–46.0)
MCHC: 32.4 g/dL (ref 30.0–36.0)
MCV: 91 fl (ref 78.0–100.0)
MONO ABS: 0.6 10*3/uL (ref 0.1–1.0)
Monocytes Relative: 9.1 % (ref 3.0–12.0)
NEUTROS PCT: 61.1 % (ref 43.0–77.0)
Neutro Abs: 3.8 10*3/uL (ref 1.4–7.7)
Platelets: 372 10*3/uL (ref 150.0–400.0)
RBC: 4.07 Mil/uL — AB (ref 4.22–5.81)
RDW: 14.8 % (ref 11.5–15.5)
WBC: 6.2 10*3/uL (ref 4.0–10.5)

## 2015-04-01 LAB — POCT URINALYSIS DIPSTICK
Bilirubin, UA: NEGATIVE
Blood, UA: NEGATIVE
Glucose, UA: NEGATIVE
KETONES UA: NEGATIVE
LEUKOCYTES UA: NEGATIVE
NITRITE UA: NEGATIVE
PH UA: 6
PROTEIN UA: NEGATIVE
Spec Grav, UA: 1.025
UROBILINOGEN UA: 0.2

## 2015-04-01 NOTE — Progress Notes (Signed)
Pre visit review using our clinic review tool, if applicable. No additional management support is needed unless otherwise documented below in the visit note. 

## 2015-04-01 NOTE — Assessment & Plan Note (Signed)
Pt healing well  F/u ortho

## 2015-04-01 NOTE — Progress Notes (Signed)
Patient ID: [name], male    DOB: 1950/08/09  Age: 64 y.o. MRN: [id number]    Subjective:  Subjective HPI PRANSHU LYSTER presents for f/u from surgery.  He had b/l knee replacement on 10/19 and was d/c from inpt rehab 11/4.  He was found to be anemic in hospital and was put on iron.  Pt is doing well.  No complaints.   Review of Systems  Constitutional: Negative for diaphoresis, appetite change, fatigue and unexpected weight change.  Eyes: Negative for pain, redness and visual disturbance.  Respiratory: Negative for cough, chest tightness, shortness of breath and wheezing.   Cardiovascular: Negative for chest pain, palpitations and leg swelling.  Endocrine: Negative for cold intolerance, heat intolerance, polydipsia, polyphagia and polyuria.  Genitourinary: Negative for dysuria, frequency and difficulty urinating.  Neurological: Negative for dizziness, light-headedness, numbness and headaches.    History Past Medical History  Diagnosis Date  . MVP (mitral valve prolapse)   . MR (mitral regurgitation)     severe; mitral valve repair in march 2009  . Atrial flutter ([place])     s/p ablation  . Hyperlipidemia   . CAD (coronary artery disease)     cath 06/2007 40% LM  . CVD (cerebrovascular disease)     59% right ICA, 49% left ICA; h/oTIA; fu study 8/11 with normal carotids  . History of colonoscopy   . Osteoarthritis     both knees  . Dysrhythmia   . Heart murmur   . Shortness of breath dyspnea     increased exertion   . History of bronchitis   . Gait abnormality   . Horseshoe kidney     He has past surgical history that includes Tonsillectomy (1960); Mitral valve repair (06/2007); heart ablation; Cardiac catheterization; Back surgery; and Total knee arthroplasty (Bilateral, 03/09/2015).   His family history includes Colitis in his daughter; Crohn's disease in his daughter; Diabetes in an other family member; Hyperlipidemia in his father; Liver cancer in his father; Liver  disease in his father. There is no history of Colon cancer, Esophageal cancer, Rectal cancer, or Stomach cancer.He reports that he has never smoked. He has never used smokeless tobacco. He reports that he drinks alcohol. He reports that he does not use illicit drugs.  Current Outpatient Prescriptions on File Prior to Visit  Medication Sig Dispense Refill  . acetaminophen (TYLENOL) 325 MG tablet Take 2 tablets (650 mg total) by mouth every 6 (six) hours as needed for mild pain (or Fever >/= 101). 40 tablet 0  . iron polysaccharides (NIFEREX) 150 MG capsule Take 1 capsule (150 mg total) by mouth daily. Take for three weeks and then discontinue 42 capsule 0  . methocarbamol (ROBAXIN) 500 MG tablet Take 1 tablet (500 mg total) by mouth every 6 (six) hours as needed for muscle spasms. 90 tablet 0  . traMADol (ULTRAM) 50 MG tablet Take 1-2 tablets (50-100 mg total) by mouth every 6 (six) hours as needed (mild pain). 90 tablet 1  . warfarin (COUMADIN) 5 MG tablet Take 1.5 tablets (7.5 mg total) by mouth daily. Take Coumadin for four weeks and then discontinue.  The dose may need to be adjusted based upon the INR.  Please follow the INR and titrate Coumadin dose for a therapeutic range between 2.0 and 3.0 INR.  After completing the four weeks of Coumadin, the patient may stop the Coumadin and resume their 81 mg Aspirin daily. (Patient taking differently: Take 5 mg by mouth daily. )  60 tablet 0   No current facility-administered medications on file prior to visit.     Objective:  Objective Physical Exam  Constitutional: He is oriented to person, place, and time. Vital signs are normal. He appears well-developed and well-nourished. He is sleeping.  HENT:  Head: Normocephalic and atraumatic.  Mouth/Throat: Oropharynx is clear and moist.  Eyes: EOM are normal. Pupils are equal, round, and reactive to light.  Neck: Normal range of motion. Neck supple. No thyromegaly present.  Cardiovascular: Normal rate and  regular rhythm.   No murmur heard. Pulmonary/Chest: Effort normal and breath sounds normal. No respiratory distress. He has no wheezes. He has no rales. He exhibits no tenderness.  Musculoskeletal: He exhibits tenderness. He exhibits no edema.  Pt walking with walker--  Both knees healing well  Neurological: He is alert and oriented to person, place, and time.  Skin: Skin is warm and dry.  Psychiatric: He has a normal mood and affect. His behavior is normal. Judgment and thought content normal.  Nursing note and vitals reviewed.  BP 124/80 mmHg  Pulse 83  Temp(Src) 97.8 F (36.6 C) (Oral)  Wt 216 lb 3.2 oz (98.068 kg)  SpO2 97% Wt Readings from Last 3 Encounters:  04/01/15 216 lb 3.2 oz (98.068 kg)  03/16/15 236 lb 3.2 oz (107.14 kg)  03/09/15 212 lb (96.163 kg)     Lab Results  Component Value Date   WBC 8.3 03/21/2015   HGB 9.0* 03/21/2015   HCT 28.3* 03/21/2015   PLT 383 03/21/2015   GLUCOSE 111* 03/21/2015   CHOL 220* 02/23/2015   TRIG 107 02/23/2015   HDL 48 02/23/2015   LDLDIRECT 200.3 02/24/2013   LDLCALC 151* 02/23/2015   ALT 90* 03/14/2015   AST 93* 03/14/2015   NA 133* 03/21/2015   K 3.9 03/21/2015   CL 102 03/21/2015   CREATININE 0.82 03/21/2015   BUN 12 03/21/2015   CO2 24 03/21/2015   TSH 1.439 09/17/2014   PSA 1.74 09/17/2014   INR 2.34* 03/23/2015   HGBA1C 5.9 08/24/2013    No results found.   Assessment & Plan:  Plan I have discontinued Mr. Elison docusate sodium. I am also having him maintain his acetaminophen, iron polysaccharides, methocarbamol, traMADol, warfarin, and amoxicillin.  Meds ordered this encounter  Medications  . amoxicillin (AMOXIL) 500 MG capsule    Sig: Take 4 capsules by mouth as directed.    Refill:  4    Problem List Items Addressed This Visit    Status post bilateral knee replacements    Pt healing well  F/u ortho      S/P mitral valve repair    On coumadin-- will start back next friday       Postoperative anemia due to acute blood loss    con't iron Check labs      Fatty liver disease, nonalcoholic    D/w pt diet and exercise        Other Visit Diagnoses    Acute blood loss anemia    -  Primary    Relevant Orders    CBC with Differential/Platelet    Urinary frequency        Relevant Orders    POCT urinalysis dipstick       Follow-up: Return in about 6 months (around 09/29/2015), or if symptoms worsen or fail to improve, for hypertension, hyperlipidemia, fasting, annual exam.  Garnet Koyanagi, DO

## 2015-04-01 NOTE — Assessment & Plan Note (Signed)
On coumadin-- will start back next friday

## 2015-04-01 NOTE — Assessment & Plan Note (Signed)
D/w pt diet and exercise

## 2015-04-01 NOTE — Assessment & Plan Note (Signed)
con't iron Check labs

## 2015-04-04 ENCOUNTER — Encounter: Payer: Self-pay | Admitting: Physical Therapy

## 2015-04-04 ENCOUNTER — Ambulatory Visit: Payer: Managed Care, Other (non HMO) | Attending: Family Medicine | Admitting: Physical Therapy

## 2015-04-04 DIAGNOSIS — M25561 Pain in right knee: Secondary | ICD-10-CM | POA: Diagnosis present

## 2015-04-04 DIAGNOSIS — M7989 Other specified soft tissue disorders: Secondary | ICD-10-CM | POA: Insufficient documentation

## 2015-04-04 DIAGNOSIS — R262 Difficulty in walking, not elsewhere classified: Secondary | ICD-10-CM | POA: Insufficient documentation

## 2015-04-04 DIAGNOSIS — M25562 Pain in left knee: Secondary | ICD-10-CM | POA: Diagnosis present

## 2015-04-04 DIAGNOSIS — M25661 Stiffness of right knee, not elsewhere classified: Secondary | ICD-10-CM | POA: Diagnosis present

## 2015-04-04 DIAGNOSIS — M25662 Stiffness of left knee, not elsewhere classified: Secondary | ICD-10-CM | POA: Diagnosis present

## 2015-04-04 DIAGNOSIS — R609 Edema, unspecified: Secondary | ICD-10-CM | POA: Insufficient documentation

## 2015-04-04 NOTE — Progress Notes (Signed)
Follow up documentation note:  Pt's deficits related to endstage OA and bilateral TKA's include: impaired gait, lower extremity weakness and pain, balance deficits.   Meredith Staggers, MD, [facility] 04/04/2015

## 2015-04-04 NOTE — Therapy (Signed)
[place] [address] [place], [place], [address] Phone: [phone]   Fax:  [phone]  Physical Therapy Evaluation  Patient Details  Name: [name] MRN: [id number] Date of Birth: August 02, 1950 Referring Provider: Gaynelle Arabian  Encounter Date: 04/04/2015      PT End of Session - 04/04/15 1557    Visit Number 1   Date for PT Re-Evaluation 06/04/15   PT Start Time 6503   PT Stop Time 1615   PT Time Calculation (min) 59 min   Activity Tolerance Patient tolerated treatment well   Behavior During Therapy [facility] for tasks assessed/performed      Past Medical History  Diagnosis Date  . MVP (mitral valve prolapse)   . MR (mitral regurgitation)     severe; mitral valve repair in march 2009  . Atrial flutter ([place])     s/p ablation  . Hyperlipidemia   . CAD (coronary artery disease)     cath 06/2007 40% LM  . CVD (cerebrovascular disease)     59% right ICA, 49% left ICA; h/oTIA; fu study 8/11 with normal carotids  . History of colonoscopy   . Osteoarthritis     both knees  . Dysrhythmia   . Heart murmur   . Shortness of breath dyspnea     increased exertion   . History of bronchitis   . Gait abnormality   . Horseshoe kidney     Past Surgical History  Procedure Laterality Date  . Tonsillectomy  1960  . Mitral valve repair  06/2007  . Heart ablation    . Cardiac catheterization    . Back surgery      secondary to ruptured disc  . Total knee arthroplasty Bilateral 03/09/2015    Procedure: TOTAL KNEE BILATERAL;  Surgeon: Gaynelle Arabian, MD;  Location: [facility];  Service: Orthopedics;  Laterality: Bilateral;    There were no vitals filed for this visit.  Visit Diagnosis:  Arthralgia of both knees - Plan: PT plan of care cert/re-cert  Knee stiffness, right - Plan: PT plan of care cert/re-cert  Knee stiffness, left - Plan: PT plan of care cert/re-cert  Difficulty walking - Plan: PT plan of care  cert/re-cert  Swelling of limb - Plan: PT plan of care cert/re-cert      Subjective Assessment - 04/04/15 1526    Subjective Patient underwent bilateral TKR's on 03/09/15.  HE had PT in the hospital and rehab then to home PT.   Patient Stated Goals move and walk better, less pain   Currently in Pain? Yes   Pain Score 3   with pain meds   Pain Location Knee   Pain Orientation Right;Left   Pain Descriptors / Indicators Aching;Tightness;Pressure   Pain Type Surgical pain   Pain Onset More than a month ago   Pain Frequency Constant   Aggravating Factors  bending and walking, and without pain meds pain up to 6/10   Pain Relieving Factors rest and pain meds at best a 2/10   Effect of Pain on Daily Activities limits ability to walk and be mobile            [facility] PT Assessment - 04/04/15 0001    Assessment   Medical Diagnosis bilateral TKR's   Referring Provider Pilar Plate Aluisio   Onset Date/Surgical Date 03/09/15   Prior Therapy at home   Precautions   Precautions None   Balance Screen   Has the patient fallen in  the past 6 months No   Has the patient had a decrease in activity level because of a fear of falling?  No   Is the patient reluctant to leave their home because of a fear of falling?  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#   Leisure was exercising 1x/week   AROM   Right Knee Extension 10   Right Knee Flexion 105   Left Knee Extension 10   Left Knee Flexion 105   PROM   Right Knee Flexion 110   Left Knee Flexion 110   Strength   Overall Strength Comments 4-/5 bilateral with pain   Palpation   Palpation comment pitting edema bilateral LE's  3+, scar is mobile proximal but from mid to distal it is tight, both LE's are warm and slightly red   Ambulation/Gait   Gait Comments using SPC today, was using a walker until Saturday, slow, antalgic gait bilateral                   [facility] Adult PT Treatment/Exercise - 04/04/15 0001    Exercises   Exercises Knee/Hip   Lumbar Exercises: Aerobic   UBE (Upper Arm Bike) NuStep Level 4 x 6 minutes   Knee/Hip Exercises: Stretches   Gastroc Stretch 3 reps;20 seconds   Knee/Hip Exercises: Machines for Strengthening   Cybex Knee Flexion 25# 2x15    Modalities   Modalities Vasopneumatic   Vasopneumatic   Number Minutes Vasopneumatic  15 minutes   Vasopnuematic Location  Knee   Vasopneumatic Pressure High   Vasopneumatic Temperature  34                  PT Short Term Goals - 04/04/15 1600    PT SHORT TERM GOAL #1   Title independent with initial HEP   Time 2   Period Weeks   Status New           PT Long Term Goals - 04/04/15 1600    PT LONG TERM GOAL #1   Title independent with advanced HEP   Time 8   Period Weeks   Status New   PT LONG TERM GOAL #2   Title increase AROM of the knee to 5-120 degrees flexion   Time 8   Period Weeks   Status New   PT LONG TERM GOAL #3   Title be able to return to work   Time 8   Period Weeks   Status New   PT LONG TERM GOAL #4   Title lift and carry 30#   Time 8   Period Weeks   Status New               Plan - 04/04/15 1558    Clinical Impression Statement Patient with bilateral TKR's on 03/09/15.  His ROM is 10-105 bilaterally, with pain and stiffness.  He has some significant swelling in the knees and lower legs.  Ambulates with a SPC   Pt will benefit from skilled therapeutic intervention in order to improve on the following deficits Abnormal gait;Decreased mobility;Decreased range of motion;Decreased strength;Difficulty walking;Impaired flexibility;Pain   Rehab Potential Good   PT  Frequency 3x / week    PT Duration 4 weeks   PT Treatment/Interventions Electrical Stimulation;Moist Heat;Ultrasound;Functional mobility training;Patient/family education;Therapeutic exercise;Therapeutic activities;Manual techniques;Vestibular   PT Next Visit Plan Slowly add exercises and gait   Consulted and Agree with Plan of Care Patient         Problem List Patient Active Problem List   Diagnosis Date Noted  . Postoperative anemia due to acute blood loss 03/19/2015  . Status post bilateral knee replacements 03/14/2015  . OA (osteoarthritis) of knee 03/09/2015  . Pre-op examination 09/17/2014  . S/P mitral valve repair 03/18/2013  . Obesity (BMI 30-39.9) 02/24/2013  . HAND PAIN, RIGHT 06/05/2010  . CAD 12/28/2009  . CAROTID STENOSIS 12/28/2009  . CEREBROVASCULAR DISEASE 12/28/2009  . PRURITUS ANI 05/26/2009  . HYPERLIPIDEMIA, FAMILIAL 04/26/2009  . DEGENERATIVE JOINT DISEASE, KNEES, BILATERAL 09/27/2008  . Fatty liver disease, nonalcoholic 36/46/8032  . ABNORMAL TRANSAMINASE-LFT'S 12/29/2007  . COLONIC POLYPS, ADENOMATOUS, HX OF 12/25/2007  . 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 FEVER, HX OF 12/22/2007  . MITRAL REGURGITATION, SEVERE 03/19/2007  . HEMOCCULT POSITIVE STOOL 02/03/2007  . SYSTOLIC MURMUR 05/13/8249    [name], PT 04/04/2015, 4:04 PM  [place] [address] [place], [place], [address] Phone: [phone]   Fax:  [phone]  Name: [name] MRN: [id number] Date of Birth: November 29, 1950

## 2015-04-06 ENCOUNTER — Ambulatory Visit: Payer: Managed Care, Other (non HMO) | Admitting: Physical Therapy

## 2015-04-06 ENCOUNTER — Encounter: Payer: Self-pay | Admitting: Physical Therapy

## 2015-04-06 DIAGNOSIS — M25562 Pain in left knee: Principal | ICD-10-CM

## 2015-04-06 DIAGNOSIS — M25662 Stiffness of left knee, not elsewhere classified: Secondary | ICD-10-CM

## 2015-04-06 DIAGNOSIS — M25561 Pain in right knee: Secondary | ICD-10-CM | POA: Diagnosis not present

## 2015-04-06 DIAGNOSIS — M7989 Other specified soft tissue disorders: Secondary | ICD-10-CM

## 2015-04-06 DIAGNOSIS — M25661 Stiffness of right knee, not elsewhere classified: Secondary | ICD-10-CM

## 2015-04-06 DIAGNOSIS — R262 Difficulty in walking, not elsewhere classified: Secondary | ICD-10-CM

## 2015-04-06 NOTE — Therapy (Signed)
[place], [place], [address] Phone: [phone]   Fax:  [phone]  Physical Therapy Treatment  Patient Details  Name: RILYN UPSHAW MRN: [id number] Date of Birth: [birth year] Referring Provider: Gaynelle Arabian  Encounter Date: 04/06/2015      PT End of Session - 04/06/15 1443    Visit Number 2   PT Start Time 1346   PT Stop Time 1450   PT Time Calculation (min) 64 min      Past Medical History  Diagnosis Date  . MVP (mitral valve prolapse)   . MR (mitral regurgitation)     severe; mitral valve repair in march 2009  . Atrial flutter ([place])     s/p ablation  . Hyperlipidemia   . CAD (coronary artery disease)     cath 06/2007 40% LM  . CVD (cerebrovascular disease)     59% right ICA, 49% left ICA; h/oTIA; fu study 8/11 with normal carotids  . History of colonoscopy   . Osteoarthritis     both knees  . Dysrhythmia   . Heart murmur   . Shortness of breath dyspnea     increased exertion   . History of bronchitis   . Gait abnormality   . Horseshoe kidney     Past Surgical History  Procedure Laterality Date  . Tonsillectomy  1960  . Mitral valve repair  06/2007  . Heart ablation    . Cardiac catheterization    . Back surgery      secondary to ruptured disc  . Total knee arthroplasty Bilateral 03/09/2015    Procedure: TOTAL KNEE BILATERAL;  Surgeon: Gaynelle Arabian, MD;  Location: [facility];  Service: Orthopedics;  Laterality: Bilateral;    There were no vitals filed for this visit.  Visit Diagnosis:  Arthralgia of both knees  Knee stiffness, right  Knee stiffness, left  Difficulty walking  Swelling of limb      Subjective Assessment - 04/06/15 1356    Subjective Patient reports he feels pretty good, pain is not too bad but he is on tramadol.  Pt reports he is sleeping in recliner because he can not lay in the bed with his legs straight secondary to pain.    Currently in  Pain? Yes   Pain Score 2    Pain Location Knee   Pain Orientation Right;Left   Pain Descriptors / Indicators Aching;Tightness;Pressure   Pain Type Surgical pain   Pain Onset More than a month ago                         [facility] Adult PT Treatment/Exercise - 04/06/15 0001    Ambulation/Gait   Gait Comments stairs foot over foot , HHA  with guarding to descend    Lumbar Exercises: Seated   Sit to Stand 10 reps  from raised seat with theraband assist    Knee/Hip Exercises: Stretches   Gastroc Stretch 3 reps;30 seconds   Knee/Hip Exercises: Machines for Strengthening   Cybex Knee Extension 15# 2x 10   Cybex Knee Flexion 25# 2x15    Knee/Hip Exercises: Standing   Gait Training resisted gait 4 ways 2x 27f black theraband    Vasopneumatic   Number Minutes Vasopneumatic  15 minutes   Vasopnuematic Location  Knee   Vasopneumatic Pressure High   Vasopneumatic Temperature  36  PT Education - 04/06/15 1456    Education provided Yes   Education Details Cautioned patient about sleeping in knee flexion or with objects under the knee to prevent loss of ROM in knee extension.    Person(s) Educated Patient   Methods Explanation   Comprehension Verbalized understanding          PT Short Term Goals - 04/04/15 1600    PT SHORT TERM GOAL #1   Title independent with initial HEP   Time 2   Period Weeks   Status New           PT Long Term Goals - 04/04/15 1600    PT LONG TERM GOAL #1   Title independent with advanced HEP   Time 8   Period Weeks   Status New   PT LONG TERM GOAL #2   Title increase AROM of the knee to 5-120 degrees flexion   Time 8   Period Weeks   Status New   PT LONG TERM GOAL #3   Title be able to return to work   Time 8   Period Weeks   Status New   PT LONG TERM GOAL #4   Title lift and carry 30#   Time 8   Period Weeks   Status New               Plan - 04/06/15 1445    Clinical Impression Statement  Pt tolerated resistive exercise well with only slight complaints of increase in pain. Pt able to complete stairs with step over step pattern but did require one rail and hand held assist, guarding for saftey.    PT Next Visit Plan Continue to work on strengthening LE and flexibility.  Work on Geophysical data processor.         Problem List Patient Active Problem List   Diagnosis Date Noted  . Postoperative anemia due to acute blood loss 03/19/2015  . Status post bilateral knee replacements 03/14/2015  . OA (osteoarthritis) of knee 03/09/2015  . Pre-op examination 09/17/2014  . S/P mitral valve repair 03/18/2013  . Obesity (BMI 30-39.9) 02/24/2013  . HAND PAIN, RIGHT 06/05/2010  . CAD 12/28/2009  . CAROTID STENOSIS 12/28/2009  . CEREBROVASCULAR DISEASE 12/28/2009  . PRURITUS ANI 05/26/2009  . HYPERLIPIDEMIA, FAMILIAL 04/26/2009  . DEGENERATIVE JOINT DISEASE, KNEES, BILATERAL 09/27/2008  . Fatty liver disease, nonalcoholic 02/54/2706  . ABNORMAL TRANSAMINASE-LFT'S 12/29/2007  . COLONIC POLYPS, ADENOMATOUS, HX OF 12/25/2007  . 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 FEVER, HX OF 12/22/2007  . MITRAL REGURGITATION, SEVERE 03/19/2007  . HEMOCCULT POSITIVE STOOL 02/03/2007  . SYSTOLIC MURMUR 23/76/2831    Crist Fat, SPTA 04/06/2015, 2:57 PM  [place] [address] [place], [place], [address] Phone: [phone]   Fax:  [phone]  Name: LAWARENCE MEEK MRN: [id number] Date of Birth: [birth year]

## 2015-04-08 ENCOUNTER — Encounter: Payer: Self-pay | Admitting: Physical Therapy

## 2015-04-08 ENCOUNTER — Ambulatory Visit: Payer: Managed Care, Other (non HMO) | Admitting: Physical Therapy

## 2015-04-08 DIAGNOSIS — M25561 Pain in right knee: Secondary | ICD-10-CM | POA: Diagnosis not present

## 2015-04-08 DIAGNOSIS — M25661 Stiffness of right knee, not elsewhere classified: Secondary | ICD-10-CM

## 2015-04-08 DIAGNOSIS — M25662 Stiffness of left knee, not elsewhere classified: Secondary | ICD-10-CM

## 2015-04-08 DIAGNOSIS — R262 Difficulty in walking, not elsewhere classified: Secondary | ICD-10-CM

## 2015-04-08 NOTE — Therapy (Signed)
[place] [address] [place], [place], [address] Phone: [phone]   Fax:  [phone]  Physical Therapy Treatment  Patient Details  Name: [name] MRN: [id number] Date of Birth: [birth year] Referring Provider: Gaynelle Arabian  Encounter Date: 04/08/2015      PT End of Session - 04/08/15 1106    Visit Number 3   Date for PT Re-Evaluation 06/04/15   PT Start Time 1020   PT Stop Time 1125   PT Time Calculation (min) 65 min      Past Medical History  Diagnosis Date  . MVP (mitral valve prolapse)   . MR (mitral regurgitation)     severe; mitral valve repair in march 2009  . Atrial flutter ([place])     s/p ablation  . Hyperlipidemia   . CAD (coronary artery disease)     cath 06/2007 40% LM  . CVD (cerebrovascular disease)     59% right ICA, 49% left ICA; h/oTIA; fu study 8/11 with normal carotids  . History of colonoscopy   . Osteoarthritis     both knees  . Dysrhythmia   . Heart murmur   . Shortness of breath dyspnea     increased exertion   . History of bronchitis   . Gait abnormality   . Horseshoe kidney     Past Surgical History  Procedure Laterality Date  . Tonsillectomy  1960  . Mitral valve repair  06/2007  . Heart ablation    . Cardiac catheterization    . Back surgery      secondary to ruptured disc  . Total knee arthroplasty Bilateral 03/09/2015    Procedure: TOTAL KNEE BILATERAL;  Surgeon: Gaynelle Arabian, MD;  Location: [facility];  Service: Orthopedics;  Laterality: Bilateral;    There were no vitals filed for this visit.  Visit Diagnosis:  Knee stiffness, right  Knee stiffness, left  Difficulty walking      Subjective Assessment - 04/08/15 1021    Subjective back is okay but I am not straight, just took pain meds   Currently in Pain? Yes   Pain Score 2    Pain Location Knee   Pain Orientation Right;Left                         [facility] Adult PT  Treatment/Exercise - 04/08/15 0001    Knee/Hip Exercises: Aerobic   Recumbent Bike 5 min   Nustep Nustep L 6 5 min   Knee/Hip Exercises: Machines for Strengthening   Cybex Knee Extension 15# 3 x 10   Cybex Knee Flexion 25# 2x15    Cybex Leg Press 20# 3 sets 10   Knee/Hip Exercises: Standing   Forward Step Up Both;1 set;10 reps;Hand Hold: 2;Step Height: 6"   Vasopneumatic   Number Minutes Vasopneumatic  15 minutes   Vasopnuematic Location  Knee   Vasopneumatic Pressure High   Vasopneumatic Temperature  36   Manual Therapy   Manual Therapy Soft tissue mobilization;Myofascial release;Passive ROM   Manual therapy comments trunk and LE                  PT Short Term Goals - 04/08/15 1107    PT SHORT TERM GOAL #1   Title independent with initial HEP   Status Achieved           PT Long Term Goals - 04/04/15 1600    PT LONG TERM GOAL #  1   Title independent with advanced HEP   Time 8   Period Weeks   Status New   PT LONG TERM GOAL #2   Title increase AROM of the knee to 5-120 degrees flexion   Time 8   Period Weeks   Status New   PT LONG TERM GOAL #3   Title be able to return to work   Time 8   Period Weeks   Status New   PT LONG TERM GOAL #4   Title lift and carry 30#   Time 8   Period Weeks   Status New               Plan - 04/08/15 1106    Clinical Impression Statement pt trunk and LE tightness, left lateral shift in trunk. Good ROM and func, VCing for correct tech with ther ex   PT Next Visit Plan Continue to work on strengthening LE and flexibility.  Work on Geophysical data processor.         Problem List Patient Active Problem List   Diagnosis Date Noted  . Postoperative anemia due to acute blood loss 03/19/2015  . Status post bilateral knee replacements 03/14/2015  . OA (osteoarthritis) of knee 03/09/2015  . Pre-op examination 09/17/2014  . S/P mitral valve repair 03/18/2013  . Obesity (BMI 30-39.9) 02/24/2013  . HAND  PAIN, RIGHT 06/05/2010  . CAD 12/28/2009  . CAROTID STENOSIS 12/28/2009  . CEREBROVASCULAR DISEASE 12/28/2009  . PRURITUS ANI 05/26/2009  . HYPERLIPIDEMIA, FAMILIAL 04/26/2009  . DEGENERATIVE JOINT DISEASE, KNEES, BILATERAL 09/27/2008  . Fatty liver disease, nonalcoholic 60/63/0160  . ABNORMAL TRANSAMINASE-LFT'S 12/29/2007  . COLONIC POLYPS, ADENOMATOUS, HX OF 12/25/2007  . 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 FEVER, HX OF 12/22/2007  . MITRAL REGURGITATION, SEVERE 03/19/2007  . HEMOCCULT POSITIVE STOOL 02/03/2007  . SYSTOLIC MURMUR 10/93/2355    [place] Hanlon,ANGIE PTA 04/08/2015, 11:09 AM  [address] [place], [place], [address] Phone: [phone]   Fax:  [phone]  Name: SAUL FABIANO MRN: [id number] Date of Birth: March 05, 1951

## 2015-04-11 ENCOUNTER — Other Ambulatory Visit: Payer: Self-pay | Admitting: Family Medicine

## 2015-04-11 ENCOUNTER — Ambulatory Visit: Payer: Managed Care, Other (non HMO) | Admitting: Physical Therapy

## 2015-04-11 ENCOUNTER — Encounter: Payer: Self-pay | Admitting: Physical Therapy

## 2015-04-11 DIAGNOSIS — R945 Abnormal results of liver function studies: Principal | ICD-10-CM

## 2015-04-11 DIAGNOSIS — M25561 Pain in right knee: Secondary | ICD-10-CM

## 2015-04-11 DIAGNOSIS — R7989 Other specified abnormal findings of blood chemistry: Secondary | ICD-10-CM

## 2015-04-11 DIAGNOSIS — M7989 Other specified soft tissue disorders: Secondary | ICD-10-CM

## 2015-04-11 DIAGNOSIS — M25661 Stiffness of right knee, not elsewhere classified: Secondary | ICD-10-CM

## 2015-04-11 DIAGNOSIS — M25662 Stiffness of left knee, not elsewhere classified: Secondary | ICD-10-CM

## 2015-04-11 DIAGNOSIS — D649 Anemia, unspecified: Secondary | ICD-10-CM

## 2015-04-11 DIAGNOSIS — M25562 Pain in left knee: Secondary | ICD-10-CM

## 2015-04-11 DIAGNOSIS — R262 Difficulty in walking, not elsewhere classified: Secondary | ICD-10-CM

## 2015-04-11 NOTE — Therapy (Signed)
[address] [place], [place], [address] Phone: [phone]   Fax:  [phone]  Physical Therapy Treatment  Patient Details  Name: [name] MRN: [id number] Date of Birth: [birth year] Referring Provider: Gaynelle Arabian  Encounter Date: 04/11/2015      PT End of Session - 04/11/15 1051    Visit Number 4   PT Start Time 1010   PT Stop Time 1110   PT Time Calculation (min) 60 min      Past Medical History  Diagnosis Date  . MVP (mitral valve prolapse)   . MR (mitral regurgitation)     severe; mitral valve repair in march 2009  . Atrial flutter ([place])     s/p ablation  . Hyperlipidemia   . CAD (coronary artery disease)     cath 06/2007 40% LM  . CVD (cerebrovascular disease)     59% right ICA, 49% left ICA; h/oTIA; fu study 8/11 with normal carotids  . History of colonoscopy   . Osteoarthritis     both knees  . Dysrhythmia   . Heart murmur   . Shortness of breath dyspnea     increased exertion   . History of bronchitis   . Gait abnormality   . Horseshoe kidney     Past Surgical History  Procedure Laterality Date  . Tonsillectomy  1960  . Mitral valve repair  06/2007  . Heart ablation    . Cardiac catheterization    . Back surgery      secondary to ruptured disc  . Total knee arthroplasty Bilateral 03/09/2015    Procedure: TOTAL KNEE BILATERAL;  Surgeon: Gaynelle Arabian, MD;  Location: [facility];  Service: Orthopedics;  Laterality: Bilateral;    There were no vitals filed for this visit.  Visit Diagnosis:  Knee stiffness, right  Knee stiffness, left  Difficulty walking  Arthralgia of both knees  Swelling of limb      Subjective Assessment - 04/11/15 1012    Subjective Pt reports he feels as if he is getting stronger, feels like most of his pain is in his sore muscles from disuse.    Currently in Pain? Yes   Pain Score 2    Pain Location Knee   Pain Orientation Right;Left   Pain Descriptors / Indicators Aching;Tightness   Pain Type Surgical pain   Pain Onset More than a month ago   Pain Frequency Constant                         [facility] Adult PT Treatment/Exercise - 04/11/15 0001    Lumbar Exercises: Aerobic   UBE (Upper Arm Bike) NuStep Level 4 x 6 minutes   Knee/Hip Exercises: Stretches   Passive Hamstring Stretch Both;2 reps;30 seconds   Gastroc Stretch 3 reps;30 seconds   Other Knee/Hip Stretches IT Band 30 sec x 2    Knee/Hip Exercises: Aerobic   Recumbent Bike 5 min   Knee/Hip Exercises: Machines for Strengthening   Cybex Knee Extension 15# 3 x 10   Cybex Knee Flexion 25# 2x15    Cybex Leg Press 30# 2x 15    Vasopneumatic   Number Minutes Vasopneumatic  20 minutes  10 both knees    Vasopnuematic Location  Knee   Vasopneumatic Pressure High   Vasopneumatic Temperature  36   Manual Therapy   Manual Therapy Soft tissue mobilization;Myofascial release;Passive ROM   Manual therapy comments trunk  and LE                  PT Short Term Goals - 04/08/15 1107    PT SHORT TERM GOAL #1   Title independent with initial HEP   Status Achieved           PT Long Term Goals - 04/04/15 1600    PT LONG TERM GOAL #1   Title independent with advanced HEP   Time 8   Period Weeks   Status New   PT LONG TERM GOAL #2   Title increase AROM of the knee to 5-120 degrees flexion   Time 8   Period Weeks   Status New   PT LONG TERM GOAL #3   Title be able to return to work   Time 8   Period Weeks   Status New   PT LONG TERM GOAL #4   Title lift and carry 30#   Time 8   Period Weeks   Status New               Plan - 04/11/15 1052    Clinical Impression Statement Pt able to get relief from pain with passive stretches. Pt tolerated increase in resistance with LE exercises well without increase in pain.    PT Next Visit Plan Continue to add LE strenghtening exercises.         Problem List Patient Active Problem  List   Diagnosis Date Noted  . Postoperative anemia due to acute blood loss 03/19/2015  . Status post bilateral knee replacements 03/14/2015  . OA (osteoarthritis) of knee 03/09/2015  . Pre-op examination 09/17/2014  . S/P mitral valve repair 03/18/2013  . Obesity (BMI 30-39.9) 02/24/2013  . HAND PAIN, RIGHT 06/05/2010  . CAD 12/28/2009  . CAROTID STENOSIS 12/28/2009  . CEREBROVASCULAR DISEASE 12/28/2009  . PRURITUS ANI 05/26/2009  . HYPERLIPIDEMIA, FAMILIAL 04/26/2009  . DEGENERATIVE JOINT DISEASE, KNEES, BILATERAL 09/27/2008  . Fatty liver disease, nonalcoholic 74/16/3845  . ABNORMAL TRANSAMINASE-LFT'S 12/29/2007  . COLONIC POLYPS, ADENOMATOUS, HX OF 12/25/2007  . 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 FEVER, HX OF 12/22/2007  . MITRAL REGURGITATION, SEVERE 03/19/2007  . HEMOCCULT POSITIVE STOOL 02/03/2007  . SYSTOLIC MURMUR 36/46/8032    Crist Fat, SPTA 04/11/2015, 10:55 AM  [address] [place], [place], [address] Phone: [phone]   Fax:  [phone]  Name: [name] MRN: [id number] Date of Birth: 12-Nov-1950

## 2015-04-12 NOTE — Telephone Encounter (Signed)
Patient has completed the Coumadin. Orders in to repeat the CMP and CBC-D.     KP

## 2015-04-12 NOTE — Telephone Encounter (Signed)
I call the patient to see if he was still taking the coumadin. Message left to call.     KP

## 2015-04-13 ENCOUNTER — Encounter: Payer: Self-pay | Admitting: Physical Therapy

## 2015-04-13 ENCOUNTER — Ambulatory Visit: Payer: Managed Care, Other (non HMO) | Admitting: Physical Therapy

## 2015-04-13 DIAGNOSIS — M7989 Other specified soft tissue disorders: Secondary | ICD-10-CM

## 2015-04-13 DIAGNOSIS — M25662 Stiffness of left knee, not elsewhere classified: Secondary | ICD-10-CM

## 2015-04-13 DIAGNOSIS — M25661 Stiffness of right knee, not elsewhere classified: Secondary | ICD-10-CM

## 2015-04-13 DIAGNOSIS — M25561 Pain in right knee: Secondary | ICD-10-CM | POA: Diagnosis not present

## 2015-04-13 DIAGNOSIS — R609 Edema, unspecified: Secondary | ICD-10-CM

## 2015-04-13 DIAGNOSIS — M25562 Pain in left knee: Secondary | ICD-10-CM

## 2015-04-13 DIAGNOSIS — R262 Difficulty in walking, not elsewhere classified: Secondary | ICD-10-CM

## 2015-04-13 NOTE — Therapy (Signed)
[place], [place], [address] Phone: [phone]   Fax:  [phone]  Physical Therapy Treatment  Patient Details  Name: [name] MRN: [id number] Date of Birth: Jun 01, 1950 Referring Provider: Gaynelle Arabian  Encounter Date: 04/13/2015      PT End of Session - 04/13/15 1049    PT Start Time 1010   PT Stop Time 1120   PT Time Calculation (min) 70 min      Past Medical History  Diagnosis Date  . MVP (mitral valve prolapse)   . MR (mitral regurgitation)     severe; mitral valve repair in march 2009  . Atrial flutter ([place])     s/p ablation  . Hyperlipidemia   . CAD (coronary artery disease)     cath 06/2007 40% LM  . CVD (cerebrovascular disease)     59% right ICA, 49% left ICA; h/oTIA; fu study 8/11 with normal carotids  . History of colonoscopy   . Osteoarthritis     both knees  . Dysrhythmia   . Heart murmur   . Shortness of breath dyspnea     increased exertion   . History of bronchitis   . Gait abnormality   . Horseshoe kidney     Past Surgical History  Procedure Laterality Date  . Tonsillectomy  1960  . Mitral valve repair  06/2007  . Heart ablation    . Cardiac catheterization    . Back surgery      secondary to ruptured disc  . Total knee arthroplasty Bilateral 03/09/2015    Procedure: TOTAL KNEE BILATERAL;  Surgeon: Gaynelle Arabian, MD;  Location: [facility];  Service: Orthopedics;  Laterality: Bilateral;    There were no vitals filed for this visit.  Visit Diagnosis:  Knee stiffness, right  Knee stiffness, left  Difficulty walking  Arthralgia of both knees  Swelling of limb  Swelling      Subjective Assessment - 04/13/15 1013    Subjective Pt reports that his knees are doing fairly well and the pain is manageable. Pt's greatest complaint is of decreased endurance, reports he is "worn out" after therapy.    Currently in Pain? Yes   Pain Score 2    Pain  Location Knee   Pain Orientation Right;Left   Pain Descriptors / Indicators Aching;Tightness   Pain Type Surgical pain   Pain Onset More than a month ago            [facility] PT Assessment - 04/13/15 0001    AROM   Right Knee Flexion 113   Left Knee Flexion 114                     [facility] Adult PT Treatment/Exercise - 04/13/15 0001    Knee/Hip Exercises: Stretches   Gastroc Stretch 3 reps;30 seconds   Knee/Hip Exercises: Aerobic   Elliptical R 5 L 5 - 4 mins    Recumbent Bike 33mn    Nustep Nustep L 6 5 min   Knee/Hip Exercises: Machines for Strengthening   Cybex Leg Press 30# 2x 15    Knee/Hip Exercises: Seated   Sit to Sand 2 sets;10 reps;without UE support  with overhead press, press with yellow ball    Vasopneumatic   Number Minutes Vasopneumatic  20 minutes  10 both knees    Vasopnuematic Location  Knee   Vasopneumatic Pressure High   Vasopneumatic Temperature  36   Manual  Therapy   Manual Therapy Soft tissue mobilization;Myofascial release;Passive ROM   Manual therapy comments trunk and LE                  PT Short Term Goals - 04/08/15 1107    PT SHORT TERM GOAL #1   Title independent with initial HEP   Status Achieved           PT Long Term Goals - 04/13/15 1047    PT LONG TERM GOAL #1   Title independent with advanced HEP   Status Achieved   PT LONG TERM GOAL #2   Baseline 113 -114 degrees flexion    Status On-going   PT LONG TERM GOAL #3   Title be able to return to work   Baseline MD to reassess after 1st of year    Status On-going   PT LONG TERM GOAL #4   Title lift and carry 30#   Status On-going               Plan - 04/13/15 1049    Clinical Impression Statement Pt challenged with dynamic surface marching and balance exercises, LOB, HHA needed with marching. Pt continues to remain motivated with therapy. ROM in Bilateral knees continues to improve. Pt reports MD advised he not return to work at this time, and  will reassess at the beginning of the year.    PT Next Visit Plan Continue to add balance exercises as tolerable         Problem List Patient Active Problem List   Diagnosis Date Noted  . Postoperative anemia due to acute blood loss 03/19/2015  . Status post bilateral knee replacements 03/14/2015  . OA (osteoarthritis) of knee 03/09/2015  . Pre-op examination 09/17/2014  . S/P mitral valve repair 03/18/2013  . Obesity (BMI 30-39.9) 02/24/2013  . HAND PAIN, RIGHT 06/05/2010  . CAD 12/28/2009  . CAROTID STENOSIS 12/28/2009  . CEREBROVASCULAR DISEASE 12/28/2009  . PRURITUS ANI 05/26/2009  . HYPERLIPIDEMIA, FAMILIAL 04/26/2009  . DEGENERATIVE JOINT DISEASE, KNEES, BILATERAL 09/27/2008  . Fatty liver disease, nonalcoholic 81/59/4707  . ABNORMAL TRANSAMINASE-LFT'S 12/29/2007  . COLONIC POLYPS, ADENOMATOUS, HX OF 12/25/2007  . 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 FEVER, HX OF 12/22/2007  . MITRAL REGURGITATION, SEVERE 03/19/2007  . HEMOCCULT POSITIVE STOOL 02/03/2007  . SYSTOLIC MURMUR 61/51/8343    Crist Fat, SPTA 04/13/2015, 10:55 AM  [place] [address] [place], [place], [address] Phone: [phone]   Fax:  [phone]  Name: [name] MRN: [id number] Date of Birth: Jan 03, 1951

## 2015-04-18 ENCOUNTER — Ambulatory Visit: Payer: Managed Care, Other (non HMO) | Admitting: Physical Therapy

## 2015-04-18 ENCOUNTER — Encounter: Payer: Self-pay | Admitting: Physical Therapy

## 2015-04-18 DIAGNOSIS — M7989 Other specified soft tissue disorders: Secondary | ICD-10-CM

## 2015-04-18 DIAGNOSIS — M25562 Pain in left knee: Secondary | ICD-10-CM

## 2015-04-18 DIAGNOSIS — M25561 Pain in right knee: Secondary | ICD-10-CM

## 2015-04-18 DIAGNOSIS — M25662 Stiffness of left knee, not elsewhere classified: Secondary | ICD-10-CM

## 2015-04-18 DIAGNOSIS — M25661 Stiffness of right knee, not elsewhere classified: Secondary | ICD-10-CM

## 2015-04-18 DIAGNOSIS — R262 Difficulty in walking, not elsewhere classified: Secondary | ICD-10-CM

## 2015-04-18 NOTE — Therapy (Signed)
[place] [address] [place], [place], [address] Phone: [phone]   Fax:  [phone]  Physical Therapy Treatment  Patient Details  Name: [name] MRN: [id number] Date of Birth: 1950/12/08 Referring Provider: Gaynelle Arabian  Encounter Date: 04/18/2015      PT End of Session - 04/18/15 1433    Visit Number 5   Date for PT Re-Evaluation 06/04/15   PT Start Time 3903   PT Stop Time 1500   PT Time Calculation (min) 65 min      Past Medical History  Diagnosis Date  . MVP (mitral valve prolapse)   . MR (mitral regurgitation)     severe; mitral valve repair in march 2009  . Atrial flutter ([place])     s/p ablation  . Hyperlipidemia   . CAD (coronary artery disease)     cath 06/2007 40% LM  . CVD (cerebrovascular disease)     59% right ICA, 49% left ICA; h/oTIA; fu study 8/11 with normal carotids  . History of colonoscopy   . Osteoarthritis     both knees  . Dysrhythmia   . Heart murmur   . Shortness of breath dyspnea     increased exertion   . History of bronchitis   . Gait abnormality   . Horseshoe kidney     Past Surgical History  Procedure Laterality Date  . Tonsillectomy  1960  . Mitral valve repair  06/2007  . Heart ablation    . Cardiac catheterization    . Back surgery      secondary to ruptured disc  . Total knee arthroplasty Bilateral 03/09/2015    Procedure: TOTAL KNEE BILATERAL;  Surgeon: Gaynelle Arabian, MD;  Location: [facility];  Service: Orthopedics;  Laterality: Bilateral;    There were no vitals filed for this visit.  Visit Diagnosis:  Knee stiffness, right  Knee stiffness, left  Difficulty walking  Arthralgia of both knees  Swelling of limb      Subjective Assessment - 04/18/15 1355    Subjective Pt reported that he tried to stop taking tylenol and really experienced an increase in pain. He reports increased stiffness and swelling with inactivity.    Currently in Pain?  Yes   Pain Score 3    Pain Location Knee   Pain Orientation Right;Left   Pain Descriptors / Indicators Aching;Tightness   Pain Type Surgical pain                         [facility] Adult PT Treatment/Exercise - 04/18/15 0001    Knee/Hip Exercises: Aerobic   Recumbent Bike 5 mins , L 2   Nustep Nustep L 6 5 min   Knee/Hip Exercises: Machines for Strengthening   Cybex Knee Extension 15# 2x 15    Cybex Knee Flexion 35# 2x 15   Cybex Leg Press 30# 2x 15    Knee/Hip Exercises: Standing   Hip ADduction --  5#   Hip Abduction AROM;1 set;Both  5#   Hip Extension AROM;1 set;15 reps;Both  5#   Knee/Hip Exercises: Seated   Sit to Sand 2 sets;10 reps;without UE support  with overhead press, press with yellow ball    Vasopneumatic   Number Minutes Vasopneumatic  20 minutes  10 both knees    Vasopnuematic Location  Knee   Vasopneumatic Pressure High   Vasopneumatic Temperature  36   Manual Therapy   Manual Therapy Soft  tissue mobilization;Myofascial release;Passive ROM   Manual therapy comments trunk and LE                  PT Short Term Goals - 04/08/15 1107    PT SHORT TERM GOAL #1   Title independent with initial HEP   Status Achieved           PT Long Term Goals - 04/13/15 1047    PT LONG TERM GOAL #1   Title independent with advanced HEP   Status Achieved   PT LONG TERM GOAL #2   Baseline 113 -114 degrees flexion    Status On-going   PT LONG TERM GOAL #3   Title be able to return to work   Baseline MD to reassess after 1st of year    Status On-going   PT LONG TERM GOAL #4   Title lift and carry 30#   Status On-going               Plan - 04/18/15 1441    Clinical Impression Statement Pt challenged but able to complete standing hip exercises. Pt tolerated increase in resistance of exercises without any complaints of increase of pain.    PT Next Visit Plan Continue to work on balance and lower extrimity strengthening.          Problem List Patient Active Problem List   Diagnosis Date Noted  . Postoperative anemia due to acute blood loss 03/19/2015  . Status post bilateral knee replacements 03/14/2015  . OA (osteoarthritis) of knee 03/09/2015  . Pre-op examination 09/17/2014  . S/P mitral valve repair 03/18/2013  . Obesity (BMI 30-39.9) 02/24/2013  . HAND PAIN, RIGHT 06/05/2010  . CAD 12/28/2009  . CAROTID STENOSIS 12/28/2009  . CEREBROVASCULAR DISEASE 12/28/2009  . PRURITUS ANI 05/26/2009  . HYPERLIPIDEMIA, FAMILIAL 04/26/2009  . DEGENERATIVE JOINT DISEASE, KNEES, BILATERAL 09/27/2008  . Fatty liver disease, nonalcoholic 02/40/9735  . ABNORMAL TRANSAMINASE-LFT'S 12/29/2007  . COLONIC POLYPS, ADENOMATOUS, HX OF 12/25/2007  . 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 FEVER, HX OF 12/22/2007  . MITRAL REGURGITATION, SEVERE 03/19/2007  . HEMOCCULT POSITIVE STOOL 02/03/2007  . SYSTOLIC MURMUR 32/99/2426    Crist Fat, SPTA 04/18/2015, 2:44 PM  [place] [address] [place], [place], [address] Phone: [phone]   Fax:  [phone]  Name: LEOMAR WESTBERG MRN: [id number] Date of Birth: [birth year]

## 2015-04-20 ENCOUNTER — Ambulatory Visit: Payer: Managed Care, Other (non HMO) | Admitting: Physical Therapy

## 2015-04-20 ENCOUNTER — Encounter: Payer: Self-pay | Admitting: Physical Therapy

## 2015-04-20 DIAGNOSIS — M25662 Stiffness of left knee, not elsewhere classified: Secondary | ICD-10-CM

## 2015-04-20 DIAGNOSIS — M25561 Pain in right knee: Secondary | ICD-10-CM | POA: Diagnosis not present

## 2015-04-20 DIAGNOSIS — R262 Difficulty in walking, not elsewhere classified: Secondary | ICD-10-CM

## 2015-04-20 DIAGNOSIS — M25562 Pain in left knee: Secondary | ICD-10-CM

## 2015-04-20 DIAGNOSIS — M25661 Stiffness of right knee, not elsewhere classified: Secondary | ICD-10-CM

## 2015-04-20 DIAGNOSIS — M7989 Other specified soft tissue disorders: Secondary | ICD-10-CM

## 2015-04-20 NOTE — Therapy (Signed)
[place], [place], [address] Phone: [phone]   Fax:  [phone]  Physical Therapy Treatment  Patient Details  Name: [name] MRN: [id number] Date of Birth: 05/30/1950 Referring Provider: Gaynelle Arabian  Encounter Date: 04/20/2015      PT End of Session - 04/20/15 1444    Visit Number 6   PT Start Time 6962   PT Stop Time 1501   PT Time Calculation (min) 65 min      Past Medical History  Diagnosis Date  . MVP (mitral valve prolapse)   . MR (mitral regurgitation)     severe; mitral valve repair in march 2009  . Atrial flutter ([place])     s/p ablation  . Hyperlipidemia   . CAD (coronary artery disease)     cath 06/2007 40% LM  . CVD (cerebrovascular disease)     59% right ICA, 49% left ICA; h/oTIA; fu study 8/11 with normal carotids  . History of colonoscopy   . Osteoarthritis     both knees  . Dysrhythmia   . Heart murmur   . Shortness of breath dyspnea     increased exertion   . History of bronchitis   . Gait abnormality   . Horseshoe kidney     Past Surgical History  Procedure Laterality Date  . Tonsillectomy  1960  . Mitral valve repair  06/2007  . Heart ablation    . Cardiac catheterization    . Back surgery      secondary to ruptured disc  . Total knee arthroplasty Bilateral 03/09/2015    Procedure: TOTAL KNEE BILATERAL;  Surgeon: Gaynelle Arabian, MD;  Location: [facility];  Service: Orthopedics;  Laterality: Bilateral;    There were no vitals filed for this visit.  Visit Diagnosis:  Knee stiffness, right  Knee stiffness, left  Difficulty walking  Arthralgia of both knees  Swelling of limb      Subjective Assessment - 04/20/15 1435    Subjective Pt reported feeling of increased tightness in the right knee on this date, was not sure of anything he did that would of irritated it.                          [facility] Adult PT Treatment/Exercise  - 04/20/15 0001    High Level Balance   High Level Balance Comments marching on airex, oblique rotations with weight ball on airex    Knee/Hip Exercises: Stretches   Gastroc Stretch 3 reps;30 seconds   Knee/Hip Exercises: Machines for Strengthening   Cybex Knee Extension 15# 2x 15    Cybex Knee Flexion 35# 2x 15   Cybex Leg Press 30# 2x 15    Knee/Hip Exercises: Seated   Sit to Sand 2 sets;15 reps;without UE support  with overhead press, press with yellow ball    Electrical Stimulation   Electrical Stimulation Location lumbar   Electrical Stimulation Action IFC   Electrical Stimulation Parameters tolerance   Electrical Stimulation Goals Pain   Vasopneumatic   Number Minutes Vasopneumatic  20 minutes  10 both knees    Vasopnuematic Location  Knee   Vasopneumatic Pressure High   Vasopneumatic Temperature  36   Manual Therapy   Manual Therapy Soft tissue mobilization;Myofascial release;Passive ROM   Manual therapy comments trunk and LE                  PT  Short Term Goals - 04/08/15 1107    PT SHORT TERM GOAL #1   Title independent with initial HEP   Status Achieved           PT Long Term Goals - 04/13/15 1047    PT LONG TERM GOAL #1   Title independent with advanced HEP   Status Achieved   PT LONG TERM GOAL #2   Baseline 113 -114 degrees flexion    Status On-going   PT LONG TERM GOAL #3   Title be able to return to work   Baseline MD to reassess after 1st of year    Status On-going   PT LONG TERM GOAL #4   Title lift and carry 30#   Status On-going               Plan - 04/20/15 1436    Clinical Impression Statement Pt complained of increased pain in the right knee with exercise today, unable to finish 5 minutes on recumbent bike. Pt reported he had not been working on stretches at home. Pt limited and required increased time secondary to pain.    PT Next Visit Plan Work on LE flexibility and balance.        Problem List Patient Active  Problem List   Diagnosis Date Noted  . Postoperative anemia due to acute blood loss 03/19/2015  . Status post bilateral knee replacements 03/14/2015  . OA (osteoarthritis) of knee 03/09/2015  . Pre-op examination 09/17/2014  . S/P mitral valve repair 03/18/2013  . Obesity (BMI 30-39.9) 02/24/2013  . HAND PAIN, RIGHT 06/05/2010  . CAD 12/28/2009  . CAROTID STENOSIS 12/28/2009  . CEREBROVASCULAR DISEASE 12/28/2009  . PRURITUS ANI 05/26/2009  . HYPERLIPIDEMIA, FAMILIAL 04/26/2009  . DEGENERATIVE JOINT DISEASE, KNEES, BILATERAL 09/27/2008  . Fatty liver disease, nonalcoholic 51/83/3582  . ABNORMAL TRANSAMINASE-LFT'S 12/29/2007  . COLONIC POLYPS, ADENOMATOUS, HX OF 12/25/2007  . 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 FEVER, HX OF 12/22/2007  . MITRAL REGURGITATION, SEVERE 03/19/2007  . HEMOCCULT POSITIVE STOOL 02/03/2007  . SYSTOLIC MURMUR 51/89/8421    Crist Fat, SPTA 04/20/2015, 2:49 PM  [place] [address] [place], [place], [address] Phone: [phone]   Fax:  [phone]  Name: [name] MRN: [id number] Date of Birth: 11-20-1950

## 2015-04-22 ENCOUNTER — Encounter: Payer: Self-pay | Admitting: Physical Therapy

## 2015-04-22 ENCOUNTER — Ambulatory Visit: Payer: Managed Care, Other (non HMO) | Attending: Family Medicine | Admitting: Physical Therapy

## 2015-04-22 DIAGNOSIS — M5440 Lumbago with sciatica, unspecified side: Secondary | ICD-10-CM | POA: Insufficient documentation

## 2015-04-22 DIAGNOSIS — R262 Difficulty in walking, not elsewhere classified: Secondary | ICD-10-CM | POA: Insufficient documentation

## 2015-04-22 DIAGNOSIS — M7989 Other specified soft tissue disorders: Secondary | ICD-10-CM | POA: Insufficient documentation

## 2015-04-22 DIAGNOSIS — M545 Low back pain: Secondary | ICD-10-CM | POA: Diagnosis present

## 2015-04-22 DIAGNOSIS — M25662 Stiffness of left knee, not elsewhere classified: Secondary | ICD-10-CM

## 2015-04-22 DIAGNOSIS — M623 Immobility syndrome (paraplegic): Secondary | ICD-10-CM | POA: Diagnosis present

## 2015-04-22 DIAGNOSIS — R609 Edema, unspecified: Secondary | ICD-10-CM | POA: Insufficient documentation

## 2015-04-22 DIAGNOSIS — M25562 Pain in left knee: Secondary | ICD-10-CM | POA: Insufficient documentation

## 2015-04-22 DIAGNOSIS — M25661 Stiffness of right knee, not elsewhere classified: Secondary | ICD-10-CM

## 2015-04-22 DIAGNOSIS — M25561 Pain in right knee: Secondary | ICD-10-CM | POA: Diagnosis present

## 2015-04-22 NOTE — Therapy (Signed)
[address] [place], [place], [address] Phone: [phone]   Fax:  [phone]  Physical Therapy Treatment  Patient Details  Name: [name] MRN: [id number] Date of Birth: 1951-01-03 Referring Provider: Gaynelle Arabian  Encounter Date: 04/22/2015      PT End of Session - 04/22/15 1124    Visit Number 7   Date for PT Re-Evaluation 06/04/15   PT Start Time 1042   PT Stop Time 1145   PT Time Calculation (min) 63 min      Past Medical History  Diagnosis Date  . MVP (mitral valve prolapse)   . MR (mitral regurgitation)     severe; mitral valve repair in march 2009  . Atrial flutter ([place])     s/p ablation  . Hyperlipidemia   . CAD (coronary artery disease)     cath 06/2007 40% LM  . CVD (cerebrovascular disease)     59% right ICA, 49% left ICA; h/oTIA; fu study 8/11 with normal carotids  . History of colonoscopy   . Osteoarthritis     both knees  . Dysrhythmia   . Heart murmur   . Shortness of breath dyspnea     increased exertion   . History of bronchitis   . Gait abnormality   . Horseshoe kidney     Past Surgical History  Procedure Laterality Date  . Tonsillectomy  1960  . Mitral valve repair  06/2007  . Heart ablation    . Cardiac catheterization    . Back surgery      secondary to ruptured disc  . Total knee arthroplasty Bilateral 03/09/2015    Procedure: TOTAL KNEE BILATERAL;  Surgeon: Gaynelle Arabian, MD;  Location: [facility];  Service: Orthopedics;  Laterality: Bilateral;    There were no vitals filed for this visit.  Visit Diagnosis:  Knee stiffness, right  Knee stiffness, left  Difficulty walking  Arthralgia of both knees  Swelling of limb      Subjective Assessment - 04/22/15 1045    Subjective Pt reports his R knee pain has decreased but still presenting with stiffness  and swelling in R knee.    Currently in Pain? Yes   Pain Score 3    Pain Orientation Right   Pain Descriptors / Indicators Aching;Tightness   Pain Onset More than a month ago   Pain Frequency Constant            [facility] PT Assessment - 04/22/15 0001    AROM   Right Knee Extension 4   Right Knee Flexion 118   Left Knee Extension 4   Left Knee Flexion 119                     [facility] Adult PT Treatment/Exercise - 04/22/15 0001    High Level Balance   High Level Balance Comments marching on airex, oblique rotations with weight ball on airex   blue ball   Knee/Hip Exercises: Stretches   Gastroc Stretch 3 reps;30 seconds   Knee/Hip Exercises: Aerobic   Elliptical R7 I10 min 5   Recumbent Bike 5 mins    Knee/Hip Exercises: Machines for Strengthening   Cybex Knee Extension 15# 2x 15    Cybex Knee Flexion 35# 2x 15   Cybex Leg Press 40# 2x 15   Electrical Stimulation   Electrical Stimulation Location knee R   Electrical Stimulation Action IFC   Electrical Stimulation Parameters tolerance  Electrical Stimulation Goals Pain   Vasopneumatic   Number Minutes Vasopneumatic  20 minutes  10 both knees    Vasopnuematic Location  Knee   Vasopneumatic Pressure High   Vasopneumatic Temperature  36   Manual Therapy   Manual Therapy Soft tissue mobilization;Myofascial release;Passive ROM   Manual therapy comments trunk and LE                PT Education - 04/22/15 1152    Education Details Educated pt on the importance of pacing    Person(s) Educated Patient   Methods Explanation   Comprehension Verbalized understanding          PT Short Term Goals - 04/08/15 1107    PT SHORT TERM GOAL #1   Title independent with initial HEP   Status Achieved           PT Long Term Goals - 04/22/15 1145    PT LONG TERM GOAL #2   Baseline 4-118 R, 4-119 L    Status Partially Met   PT LONG TERM GOAL #3   Title be able to return to work   Status On-going   PT LONG TERM GOAL #4   Title lift and carry 30#   Status On-going               Plan -  04/22/15 1151    Clinical Impression Statement Pt ROM and strength continue to improve. Pt still presenting with increased soreness and stiffness especially in the R knee.    PT Next Visit Plan Work on balance and continue to add exercises as tolerable         Problem List Patient Active Problem List   Diagnosis Date Noted  . Postoperative anemia due to acute blood loss 03/19/2015  . Status post bilateral knee replacements 03/14/2015  . OA (osteoarthritis) of knee 03/09/2015  . Pre-op examination 09/17/2014  . S/P mitral valve repair 03/18/2013  . Obesity (BMI 30-39.9) 02/24/2013  . HAND PAIN, RIGHT 06/05/2010  . CAD 12/28/2009  . CAROTID STENOSIS 12/28/2009  . CEREBROVASCULAR DISEASE 12/28/2009  . PRURITUS ANI 05/26/2009  . HYPERLIPIDEMIA, FAMILIAL 04/26/2009  . DEGENERATIVE JOINT DISEASE, KNEES, BILATERAL 09/27/2008  . Fatty liver disease, nonalcoholic 83/33/8329  . ABNORMAL TRANSAMINASE-LFT'S 12/29/2007  . COLONIC POLYPS, ADENOMATOUS, HX OF 12/25/2007  . 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 FEVER, HX OF 12/22/2007  . MITRAL REGURGITATION, SEVERE 03/19/2007  . HEMOCCULT POSITIVE STOOL 02/03/2007  . SYSTOLIC MURMUR 19/16/6060    Sumner Boast, PTA 04/22/2015, 11:54 AM  [address] [address] [place], [place], [address] Phone: [phone]   Fax:  [phone]  Name: [name] MRN: [id number] Date of Birth: [birth year]

## 2015-04-25 ENCOUNTER — Ambulatory Visit: Payer: Managed Care, Other (non HMO) | Admitting: Physical Therapy

## 2015-04-25 ENCOUNTER — Encounter: Payer: Self-pay | Admitting: Physical Therapy

## 2015-04-25 DIAGNOSIS — M25561 Pain in right knee: Secondary | ICD-10-CM

## 2015-04-25 DIAGNOSIS — M25662 Stiffness of left knee, not elsewhere classified: Secondary | ICD-10-CM

## 2015-04-25 DIAGNOSIS — M7989 Other specified soft tissue disorders: Secondary | ICD-10-CM

## 2015-04-25 DIAGNOSIS — M25661 Stiffness of right knee, not elsewhere classified: Secondary | ICD-10-CM

## 2015-04-25 DIAGNOSIS — M25562 Pain in left knee: Secondary | ICD-10-CM

## 2015-04-25 DIAGNOSIS — R262 Difficulty in walking, not elsewhere classified: Secondary | ICD-10-CM

## 2015-04-25 NOTE — Therapy (Signed)
[place] [place], [place], [address] Phone: [phone]   Fax:  [phone]  Physical Therapy Treatment  Patient Details  Name: [name] MRN: [id number] Date of Birth: December 11, 1950 Referring Provider: Gaynelle Arabian  Encounter Date: 04/25/2015      PT End of Session - 04/25/15 1105    Visit Number 8   Date for PT Re-Evaluation 06/04/15   PT Start Time 9983   PT Stop Time 1120   PT Time Calculation (min) 65 min      Past Medical History  Diagnosis Date  . MVP (mitral valve prolapse)   . MR (mitral regurgitation)     severe; mitral valve repair in march 2009  . Atrial flutter ([place])     s/p ablation  . Hyperlipidemia   . CAD (coronary artery disease)     cath 06/2007 40% LM  . CVD (cerebrovascular disease)     59% right ICA, 49% left ICA; h/oTIA; fu study 8/11 with normal carotids  . History of colonoscopy   . Osteoarthritis     both knees  . Dysrhythmia   . Heart murmur   . Shortness of breath dyspnea     increased exertion   . History of bronchitis   . Gait abnormality   . Horseshoe kidney     Past Surgical History  Procedure Laterality Date  . Tonsillectomy  1960  . Mitral valve repair  06/2007  . Heart ablation    . Cardiac catheterization    . Back surgery      secondary to ruptured disc  . Total knee arthroplasty Bilateral 03/09/2015    Procedure: TOTAL KNEE BILATERAL;  Surgeon: Gaynelle Arabian, MD;  Location: [facility];  Service: Orthopedics;  Laterality: Bilateral;    There were no vitals filed for this visit.  Visit Diagnosis:  Knee stiffness, right  Knee stiffness, left  Difficulty walking  Arthralgia of both knees  Swelling of limb      Subjective Assessment - 04/25/15 1018    Subjective Pt reports his knees are feeling descent today. Reports his knees and back are stiff first thing in the morning.  Reports both knees feel the same today, one is not bothering  him more than another.   Currently in Pain? Yes   Pain Score 3    Pain Location Knee   Pain Orientation Right;Left   Pain Descriptors / Indicators Aching;Tightness   Pain Type Surgical pain   Pain Onset More than a month ago   Pain Frequency Constant                         [facility] Adult PT Treatment/Exercise - 04/25/15 0001    High Level Balance   High Level Balance Comments Marching high knees on airex, resisted gait black band 4 ways x 100 ft    Knee/Hip Exercises: Stretches   Gastroc Stretch 3 reps;30 seconds   Knee/Hip Exercises: Aerobic   Nustep Nustep L 6 6 min   Knee/Hip Exercises: Machines for Strengthening   Cybex Leg Press 40# 2x 15   Knee/Hip Exercises: Seated   Sit to Sand 2 sets;15 reps;without UE support  with overhead press, press with yellow ball    Modalities   Modalities Moist Heat;Electrical Stimulation;Vasopneumatic   Moist Heat Therapy   Number Minutes Moist Heat 15 Minutes   Moist Heat Location Lumbar Spine   Cryotherapy   Number  Minutes Cryotherapy 15 Minutes   Cryotherapy Location Knee   Type of Cryotherapy Ice pack   Electrical Stimulation   Electrical Stimulation Location knee R   Electrical Stimulation Action IFCs   Electrical Stimulation Parameters tolerance   Electrical Stimulation Goals Pain   Vasopneumatic   Number Minutes Vasopneumatic  15 minutes   Vasopnuematic Location  Knee  right    Vasopneumatic Pressure High   Vasopneumatic Temperature  36   Manual Therapy   Manual Therapy Soft tissue mobilization;Myofascial release;Passive ROM   Manual therapy comments trunk and LE                  PT Short Term Goals - 04/08/15 1107    PT SHORT TERM GOAL #1   Title independent with initial HEP   Status Achieved           PT Long Term Goals - 04/22/15 1145    PT LONG TERM GOAL #2   Baseline 4-118 R, 4-119 L    Status Partially Met   PT LONG TERM GOAL #3   Title be able to return to work   Status On-going    PT LONG TERM GOAL #4   Title lift and carry 30#   Status On-going               Plan - 04/25/15 1056    Clinical Impression Statement Pt does well and is motivated with all exercises. Greatest limitiations continue to be knee and lumbar stiffness and balance.    PT Next Visit Plan TKE exercises and balance         Problem List Patient Active Problem List   Diagnosis Date Noted  . Postoperative anemia due to acute blood loss 03/19/2015  . Status post bilateral knee replacements 03/14/2015  . OA (osteoarthritis) of knee 03/09/2015  . Pre-op examination 09/17/2014  . S/P mitral valve repair 03/18/2013  . Obesity (BMI 30-39.9) 02/24/2013  . HAND PAIN, RIGHT 06/05/2010  . CAD 12/28/2009  . CAROTID STENOSIS 12/28/2009  . CEREBROVASCULAR DISEASE 12/28/2009  . PRURITUS ANI 05/26/2009  . HYPERLIPIDEMIA, FAMILIAL 04/26/2009  . DEGENERATIVE JOINT DISEASE, KNEES, BILATERAL 09/27/2008  . Fatty liver disease, nonalcoholic 83/66/2947  . ABNORMAL TRANSAMINASE-LFT'S 12/29/2007  . COLONIC POLYPS, ADENOMATOUS, HX OF 12/25/2007  . 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 FEVER, HX OF 12/22/2007  . MITRAL REGURGITATION, SEVERE 03/19/2007  . HEMOCCULT POSITIVE STOOL 02/03/2007  . SYSTOLIC MURMUR 65/46/5035    Crist Fat, SPTA 04/25/2015, 11:09 AM  [address] [address] [place], [place], [address] Phone: [phone]   Fax:  [phone]  Name: DRAKE WUERTZ MRN: [id number] Date of Birth: [birth year]

## 2015-04-27 ENCOUNTER — Ambulatory Visit: Payer: Managed Care, Other (non HMO) | Admitting: Physical Therapy

## 2015-04-27 ENCOUNTER — Encounter: Payer: Self-pay | Admitting: Physical Therapy

## 2015-04-27 DIAGNOSIS — M25661 Stiffness of right knee, not elsewhere classified: Secondary | ICD-10-CM | POA: Diagnosis not present

## 2015-04-27 DIAGNOSIS — M25562 Pain in left knee: Secondary | ICD-10-CM

## 2015-04-27 DIAGNOSIS — R262 Difficulty in walking, not elsewhere classified: Secondary | ICD-10-CM

## 2015-04-27 DIAGNOSIS — M25561 Pain in right knee: Secondary | ICD-10-CM

## 2015-04-27 DIAGNOSIS — M25662 Stiffness of left knee, not elsewhere classified: Secondary | ICD-10-CM

## 2015-04-27 DIAGNOSIS — M7989 Other specified soft tissue disorders: Secondary | ICD-10-CM

## 2015-04-27 NOTE — Therapy (Signed)
[place], [place], [address] Phone: [phone]   Fax:  [phone]  Physical Therapy Treatment  Patient Details  Name: [name] MRN: [id number] Date of Birth: November 23, 1950 Referring Provider: Gaynelle Arabian  Encounter Date: 04/27/2015      PT End of Session - 04/27/15 1053    Visit Number 9   Date for PT Re-Evaluation 06/04/15   PT Start Time 1005   PT Stop Time 1115   PT Time Calculation (min) 70 min      Past Medical History  Diagnosis Date  . MVP (mitral valve prolapse)   . MR (mitral regurgitation)     severe; mitral valve repair in march 2009  . Atrial flutter ([place])     s/p ablation  . Hyperlipidemia   . CAD (coronary artery disease)     cath 06/2007 40% LM  . CVD (cerebrovascular disease)     59% right ICA, 49% left ICA; h/oTIA; fu study 8/11 with normal carotids  . History of colonoscopy   . Osteoarthritis     both knees  . Dysrhythmia   . Heart murmur   . Shortness of breath dyspnea     increased exertion   . History of bronchitis   . Gait abnormality   . Horseshoe kidney     Past Surgical History  Procedure Laterality Date  . Tonsillectomy  1960  . Mitral valve repair  06/2007  . Heart ablation    . Cardiac catheterization    . Back surgery      secondary to ruptured disc  . Total knee arthroplasty Bilateral 03/09/2015    Procedure: TOTAL KNEE BILATERAL;  Surgeon: Gaynelle Arabian, MD;  Location: [facility];  Service: Orthopedics;  Laterality: Bilateral;    There were no vitals filed for this visit.  Visit Diagnosis:  Knee stiffness, right  Knee stiffness, left  Difficulty walking  Arthralgia of both knees  Swelling of limb      Subjective Assessment - 04/27/15 1054    Subjective Pt reports pain in his right knee has decreased. Stated he worked out this morning on his total gym and feels like his knees and back are a little bit more loose as a result.     Pain Score 3    Pain Location Knee   Pain Orientation Right;Left   Pain Descriptors / Indicators Aching;Tightness;Sore   Pain Type Surgical pain                         [facility] Adult PT Treatment/Exercise - 04/27/15 0001    High Level Balance   High Level Balance Comments marching on airex, oblique rotations with weight ball on airex   blue ball   Knee/Hip Exercises: Stretches   Gastroc Stretch 3 reps;30 seconds   Knee/Hip Exercises: Aerobic   Recumbent Bike 5 mins    Nustep Nustep L 7 6 min   Knee/Hip Exercises: Machines for Strengthening   Cybex Knee Extension 15# 2x 15    Cybex Knee Flexion 35# 2x 15   Knee/Hip Exercises: Standing   Hip Abduction Stengthening;Both;2 sets;10 reps  10#   Hip Extension Stengthening;2 sets;10 reps;Both   Knee/Hip Exercises: Seated   Sit to Sand 2 sets;15 reps;without UE support  with overhead press, press with blue ball    Electrical Stimulation   Electrical Stimulation Location knee R   Electrical Stimulation Action IFC  Electrical Stimulation Parameters tolerance   Electrical Stimulation Goals Pain   Manual Therapy   Manual Therapy Soft tissue mobilization;Myofascial release;Passive ROM   Manual therapy comments trunk and LE                  PT Short Term Goals - 04/08/15 1107    PT SHORT TERM GOAL #1   Title independent with initial HEP   Status Achieved           PT Long Term Goals - 04/22/15 1145    PT LONG TERM GOAL #2   Baseline 4-118 R, 4-119 L    Status Partially Met   PT LONG TERM GOAL #3   Title be able to return to work   Status On-going   PT LONG TERM GOAL #4   Title lift and carry 30#   Status On-going               Plan - 04/27/15 1055    Clinical Impression Statement Pt continues to report decreased pain with exercise. Pt very challenged by eyes closed exercises on airex for balance.    PT Next Visit Plan High level balance and ROM exercises         Problem  List Patient Active Problem List   Diagnosis Date Noted  . Postoperative anemia due to acute blood loss 03/19/2015  . Status post bilateral knee replacements 03/14/2015  . OA (osteoarthritis) of knee 03/09/2015  . Pre-op examination 09/17/2014  . S/P mitral valve repair 03/18/2013  . Obesity (BMI 30-39.9) 02/24/2013  . HAND PAIN, RIGHT 06/05/2010  . CAD 12/28/2009  . CAROTID STENOSIS 12/28/2009  . CEREBROVASCULAR DISEASE 12/28/2009  . PRURITUS ANI 05/26/2009  . HYPERLIPIDEMIA, FAMILIAL 04/26/2009  . DEGENERATIVE JOINT DISEASE, KNEES, BILATERAL 09/27/2008  . Fatty liver disease, nonalcoholic 20/99/0689  . ABNORMAL TRANSAMINASE-LFT'S 12/29/2007  . COLONIC POLYPS, ADENOMATOUS, HX OF 12/25/2007  . 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 FEVER, HX OF 12/22/2007  . MITRAL REGURGITATION, SEVERE 03/19/2007  . HEMOCCULT POSITIVE STOOL 02/03/2007  . SYSTOLIC MURMUR 34/10/8401    Crist Fat 04/27/2015, 11:20 AM  [address] [place], [place], [address] Phone: [phone]   Fax:  [phone]  Name: [name] MRN: [id number] Date of Birth: Jul 03, 1950

## 2015-04-29 ENCOUNTER — Encounter: Payer: Self-pay | Admitting: Physical Therapy

## 2015-04-29 ENCOUNTER — Ambulatory Visit: Payer: Managed Care, Other (non HMO) | Admitting: Physical Therapy

## 2015-04-29 DIAGNOSIS — M25662 Stiffness of left knee, not elsewhere classified: Secondary | ICD-10-CM

## 2015-04-29 DIAGNOSIS — M25661 Stiffness of right knee, not elsewhere classified: Secondary | ICD-10-CM

## 2015-04-29 DIAGNOSIS — M7989 Other specified soft tissue disorders: Secondary | ICD-10-CM

## 2015-04-29 DIAGNOSIS — R262 Difficulty in walking, not elsewhere classified: Secondary | ICD-10-CM

## 2015-04-29 NOTE — Therapy (Signed)
[place] [address] [place], [place], [address] Phone: [phone]   Fax:  [phone]  Physical Therapy Treatment  Patient Details  Name: [name] MRN: [id number] Date of Birth: 1951-03-26 Referring Provider: Gaynelle Arabian  Encounter Date: 04/29/2015      PT End of Session - 04/29/15 1054    Visit Number 10   Date for PT Re-Evaluation 06/04/15   PT Start Time 1003   PT Stop Time 1115   PT Time Calculation (min) 72 min   Activity Tolerance Patient tolerated treatment well   Behavior During Therapy [facility] for tasks assessed/performed      Past Medical History  Diagnosis Date  . MVP (mitral valve prolapse)   . MR (mitral regurgitation)     severe; mitral valve repair in march 2009  . Atrial flutter ([place])     s/p ablation  . Hyperlipidemia   . CAD (coronary artery disease)     cath 06/2007 40% LM  . CVD (cerebrovascular disease)     59% right ICA, 49% left ICA; h/oTIA; fu study 8/11 with normal carotids  . History of colonoscopy   . Osteoarthritis     both knees  . Dysrhythmia   . Heart murmur   . Shortness of breath dyspnea     increased exertion   . History of bronchitis   . Gait abnormality   . Horseshoe kidney     Past Surgical History  Procedure Laterality Date  . Tonsillectomy  1960  . Mitral valve repair  06/2007  . Heart ablation    . Cardiac catheterization    . Back surgery      secondary to ruptured disc  . Total knee arthroplasty Bilateral 03/09/2015    Procedure: TOTAL KNEE BILATERAL;  Surgeon: Gaynelle Arabian, MD;  Location: [facility];  Service: Orthopedics;  Laterality: Bilateral;    There were no vitals filed for this visit.  Visit Diagnosis:  Knee stiffness, right  Knee stiffness, left  Difficulty walking  Swelling of limb      Subjective Assessment - 04/29/15 1021    Subjective I am feeling better, moving better.   Currently in Pain? Yes   Pain Score 3    Pain  Location Knee   Pain Orientation Right;Left            [facility] PT Assessment - 04/29/15 0001    AROM   Right Knee Extension 4   Right Knee Flexion 119   Left Knee Extension 3   Left Knee Flexion 120                     [facility] Adult PT Treatment/Exercise - 04/29/15 0001    High Level Balance   High Level Balance Activities Side stepping;Backward walking;Tandem walking;Marching forwards   High Level Balance Comments marching on airex, oblique rotations with weight ball on airex , ball kicks, toe walking, heel walking   Knee/Hip Exercises: Aerobic   Elliptical R7 I10 min 5   Recumbent Bike 5 mins    Nustep Nustep L 7 6 min   Knee/Hip Exercises: Machines for Strengthening   Cybex Knee Extension 10# 2x15 with focus on TKE   Cybex Knee Flexion 35# 2x 15   Knee/Hip Exercises: Standing   Other Standing Knee Exercises feet on ball knee to chest with over pressure for knee flexion stretch, bridges with feet on ball.   Other Standing Knee Exercises seated weighted  ball thoracic extension.  feet on ball bridges   Knee/Hip Exercises: Seated   Sit to Sand 2 sets;15 reps;without UE support   Vasopneumatic   Number Minutes Vasopneumatic  15 minutes   Vasopnuematic Location  Knee   Vasopneumatic Pressure High   Vasopneumatic Temperature  36                  PT Short Term Goals - 04/08/15 1107    PT SHORT TERM GOAL #1   Title independent with initial HEP   Status Achieved           PT Long Term Goals - 04/29/15 1058    PT LONG TERM GOAL #1   Title independent with advanced HEP   Status Achieved   PT LONG TERM GOAL #2   Title increase AROM of the knee to 5-120 degrees flexion   Status Partially Met   PT LONG TERM GOAL #3   Title be able to return to work   Status On-going   PT LONG TERM GOAL #4   Title lift and carry 30#   Status On-going               Plan - 04/29/15 1055    Clinical Impression Statement Patient continues to have tightness  in the posterior right knee.  He also has stiffness of the low back, has a gait pattern that is still slow and stiff, the stiffness appears in the knees and the back. AROM is close to 120 degrees flexion.   PT Next Visit Plan High level balance and ROM exercises    Consulted and Agree with Plan of Care Patient        Problem List Patient Active Problem List   Diagnosis Date Noted  . Postoperative anemia due to acute blood loss 03/19/2015  . Status post bilateral knee replacements 03/14/2015  . OA (osteoarthritis) of knee 03/09/2015  . Pre-op examination 09/17/2014  . S/P mitral valve repair 03/18/2013  . Obesity (BMI 30-39.9) 02/24/2013  . HAND PAIN, RIGHT 06/05/2010  . CAD 12/28/2009  . CAROTID STENOSIS 12/28/2009  . CEREBROVASCULAR DISEASE 12/28/2009  . PRURITUS ANI 05/26/2009  . HYPERLIPIDEMIA, FAMILIAL 04/26/2009  . DEGENERATIVE JOINT DISEASE, KNEES, BILATERAL 09/27/2008  . Fatty liver disease, nonalcoholic 32/04/2481  . ABNORMAL TRANSAMINASE-LFT'S 12/29/2007  . COLONIC POLYPS, ADENOMATOUS, HX OF 12/25/2007  . 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 FEVER, HX OF 12/22/2007  . MITRAL REGURGITATION, SEVERE 03/19/2007  . HEMOCCULT POSITIVE STOOL 02/03/2007  . SYSTOLIC MURMUR 50/07/7046    [name], PT 04/29/2015, 11:00 AM  [place] [address] [place], [place], [address] Phone: [phone]   Fax:  [phone]  Name: [name] MRN: [id number] Date of Birth: [birth year]

## 2015-05-02 ENCOUNTER — Encounter: Payer: Self-pay | Admitting: Physical Therapy

## 2015-05-02 ENCOUNTER — Ambulatory Visit: Payer: Managed Care, Other (non HMO) | Admitting: Physical Therapy

## 2015-05-02 DIAGNOSIS — M25562 Pain in left knee: Secondary | ICD-10-CM

## 2015-05-02 DIAGNOSIS — M25662 Stiffness of left knee, not elsewhere classified: Secondary | ICD-10-CM

## 2015-05-02 DIAGNOSIS — M25661 Stiffness of right knee, not elsewhere classified: Secondary | ICD-10-CM | POA: Diagnosis not present

## 2015-05-02 DIAGNOSIS — M25561 Pain in right knee: Secondary | ICD-10-CM

## 2015-05-02 DIAGNOSIS — R262 Difficulty in walking, not elsewhere classified: Secondary | ICD-10-CM

## 2015-05-02 NOTE — Therapy (Signed)
[place] [address] [place], [place], [address] Phone: [phone]   Fax:  [phone]  Physical Therapy Treatment  Patient Details  Name: [name] MRN: [id number] Date of Birth: 1950-10-30 Referring Provider: Gaynelle Arabian  Encounter Date: 05/02/2015      PT End of Session - 05/02/15 1108    Visit Number 11   Date for PT Re-Evaluation 06/04/15   PT Start Time 1015   PT Stop Time 1116   PT Time Calculation (min) 61 min   Activity Tolerance Patient tolerated treatment well   Behavior During Therapy [facility] for tasks assessed/performed      Past Medical History  Diagnosis Date  . MVP (mitral valve prolapse)   . MR (mitral regurgitation)     severe; mitral valve repair in march 2009  . Atrial flutter ([place])     s/p ablation  . Hyperlipidemia   . CAD (coronary artery disease)     cath 06/2007 40% LM  . CVD (cerebrovascular disease)     59% right ICA, 49% left ICA; h/oTIA; fu study 8/11 with normal carotids  . History of colonoscopy   . Osteoarthritis     both knees  . Dysrhythmia   . Heart murmur   . Shortness of breath dyspnea     increased exertion   . History of bronchitis   . Gait abnormality   . Horseshoe kidney     Past Surgical History  Procedure Laterality Date  . Tonsillectomy  1960  . Mitral valve repair  06/2007  . Heart ablation    . Cardiac catheterization    . Back surgery      secondary to ruptured disc  . Total knee arthroplasty Bilateral 03/09/2015    Procedure: TOTAL KNEE BILATERAL;  Surgeon: Gaynelle Arabian, MD;  Location: [facility];  Service: Orthopedics;  Laterality: Bilateral;    There were no vitals filed for this visit.  Visit Diagnosis:  Knee stiffness, right  Knee stiffness, left  Difficulty walking  Arthralgia of both knees      Subjective Assessment - 05/02/15 1104    Subjective I just get stiff all the time, no matter what I do, I have stopped taking the  pain meds   Currently in Pain? Yes   Pain Score 3    Pain Location Knee   Pain Orientation Left;Right   Aggravating Factors  being up on my feet, sitting a long period   Pain Relieving Factors easy exercises                         [facility] Adult PT Treatment/Exercise - 05/02/15 0001    High Level Balance   High Level Balance Activities Side stepping;Backward walking;Tandem walking;Marching forwards   High Level Balance Comments resisted gait   Lumbar Exercises: Stretches   Passive Hamstring Stretch 30 seconds;4 reps   Single Knee to Chest Stretch 10 seconds;5 reps   ITB Stretch 20 seconds;4 reps   Knee/Hip Exercises: Aerobic   Elliptical R7 I10 min 5   Nustep Nustep L 7 6 min   Knee/Hip Exercises: Machines for Strengthening   Cybex Leg Press 50# 2x15, then no weight just working on going as low as he can   Other Machine back extension with 25#   Knee/Hip Exercises: Standing   Other Standing Knee Exercises feet on ball knee to chest with over pressure for knee flexion stretch, bridges with feet  on ball.   Other Standing Knee Exercises seated weighted ball thoracic extension.  feet on ball bridges   Moist Heat Therapy   Number Minutes Moist Heat 15 Minutes   Moist Heat Location Lumbar Spine   Electrical Stimulation   Electrical Stimulation Location knee R   Electrical Stimulation Action IFC   Electrical Stimulation Parameters tolerance   Electrical Stimulation Goals Pain   Vasopneumatic   Number Minutes Vasopneumatic  15 minutes   Vasopnuematic Location  Knee   Vasopneumatic Pressure High   Vasopneumatic Temperature  36                  PT Short Term Goals - 04/08/15 1107    PT SHORT TERM GOAL #1   Title independent with initial HEP   Status Achieved           PT Long Term Goals - 04/29/15 1058    PT LONG TERM GOAL #1   Title independent with advanced HEP   Status Achieved   PT LONG TERM GOAL #2   Title increase AROM of the knee to 5-120  degrees flexion   Status Partially Met   PT LONG TERM GOAL #3   Title be able to return to work   Status On-going   PT LONG TERM GOAL #4   Title lift and carry 30#   Status On-going               Plan - 05/02/15 1109    Clinical Impression Statement Patient comes in and is very stiff in the back and in the knees, once he gets moving he looks better and moves better.  He has stopped taking the pain meds.  Has difficulty with balance activities   PT Next Visit Plan High level balance and ROM exercises    Consulted and Agree with Plan of Care Patient        Problem List Patient Active Problem List   Diagnosis Date Noted  . Postoperative anemia due to acute blood loss 03/19/2015  . Status post bilateral knee replacements 03/14/2015  . OA (osteoarthritis) of knee 03/09/2015  . Pre-op examination 09/17/2014  . S/P mitral valve repair 03/18/2013  . Obesity (BMI 30-39.9) 02/24/2013  . HAND PAIN, RIGHT 06/05/2010  . CAD 12/28/2009  . CAROTID STENOSIS 12/28/2009  . CEREBROVASCULAR DISEASE 12/28/2009  . PRURITUS ANI 05/26/2009  . HYPERLIPIDEMIA, FAMILIAL 04/26/2009  . DEGENERATIVE JOINT DISEASE, KNEES, BILATERAL 09/27/2008  . Fatty liver disease, nonalcoholic 33/00/7622  . ABNORMAL TRANSAMINASE-LFT'S 12/29/2007  . COLONIC POLYPS, ADENOMATOUS, HX OF 12/25/2007  . 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 FEVER, HX OF 12/22/2007  . MITRAL REGURGITATION, SEVERE 03/19/2007  . HEMOCCULT POSITIVE STOOL 02/03/2007  . SYSTOLIC MURMUR 63/33/5456    [name], PT 05/02/2015, 11:11 AM  [place] [address] [place], [place], [address] Phone: [phone]   Fax:  [phone]  Name: [name] MRN: [id number] Date of Birth: 05-27-1950

## 2015-05-03 ENCOUNTER — Other Ambulatory Visit (INDEPENDENT_AMBULATORY_CARE_PROVIDER_SITE_OTHER): Payer: Managed Care, Other (non HMO)

## 2015-05-03 DIAGNOSIS — R7989 Other specified abnormal findings of blood chemistry: Secondary | ICD-10-CM

## 2015-05-03 DIAGNOSIS — D649 Anemia, unspecified: Secondary | ICD-10-CM

## 2015-05-03 DIAGNOSIS — R945 Abnormal results of liver function studies: Principal | ICD-10-CM

## 2015-05-03 LAB — CBC WITH DIFFERENTIAL/PLATELET
BASOS PCT: 0.5 % (ref 0.0–3.0)
Basophils Absolute: 0 10*3/uL (ref 0.0–0.1)
EOS PCT: 3.7 % (ref 0.0–5.0)
Eosinophils Absolute: 0.2 10*3/uL (ref 0.0–0.7)
HEMATOCRIT: 43.2 % (ref 39.0–52.0)
HEMOGLOBIN: 14.1 g/dL (ref 13.0–17.0)
LYMPHS PCT: 32 % (ref 12.0–46.0)
Lymphs Abs: 1.8 10*3/uL (ref 0.7–4.0)
MCHC: 32.8 g/dL (ref 30.0–36.0)
MCV: 90.3 fl (ref 78.0–100.0)
MONO ABS: 0.5 10*3/uL (ref 0.1–1.0)
Monocytes Relative: 9.3 % (ref 3.0–12.0)
Neutro Abs: 3.1 10*3/uL (ref 1.4–7.7)
Neutrophils Relative %: 54.5 % (ref 43.0–77.0)
Platelets: 248 10*3/uL (ref 150.0–400.0)
RBC: 4.78 Mil/uL (ref 4.22–5.81)
RDW: 14.7 % (ref 11.5–15.5)
WBC: 5.8 10*3/uL (ref 4.0–10.5)

## 2015-05-03 LAB — COMPREHENSIVE METABOLIC PANEL WITH GFR
ALT: 29 U/L (ref 0–53)
AST: 26 U/L (ref 0–37)
Albumin: 4.5 g/dL (ref 3.5–5.2)
Alkaline Phosphatase: 77 U/L (ref 39–117)
BUN: 15 mg/dL (ref 6–23)
CO2: 26 meq/L (ref 19–32)
Calcium: 9.6 mg/dL (ref 8.4–10.5)
Chloride: 102 meq/L (ref 96–112)
Creatinine, Ser: 0.99 mg/dL (ref 0.40–1.50)
GFR: 81.41 mL/min
Glucose, Bld: 106 mg/dL — ABNORMAL HIGH (ref 70–99)
Potassium: 4.1 meq/L (ref 3.5–5.1)
Sodium: 136 meq/L (ref 135–145)
Total Bilirubin: 0.5 mg/dL (ref 0.2–1.2)
Total Protein: 7.1 g/dL (ref 6.0–8.3)

## 2015-05-04 ENCOUNTER — Encounter: Payer: Self-pay | Admitting: Physical Therapy

## 2015-05-04 ENCOUNTER — Ambulatory Visit: Payer: Managed Care, Other (non HMO) | Admitting: Physical Therapy

## 2015-05-04 DIAGNOSIS — M25661 Stiffness of right knee, not elsewhere classified: Secondary | ICD-10-CM | POA: Diagnosis not present

## 2015-05-04 DIAGNOSIS — M7989 Other specified soft tissue disorders: Secondary | ICD-10-CM

## 2015-05-04 DIAGNOSIS — M25662 Stiffness of left knee, not elsewhere classified: Secondary | ICD-10-CM

## 2015-05-04 DIAGNOSIS — M25561 Pain in right knee: Secondary | ICD-10-CM

## 2015-05-04 DIAGNOSIS — R262 Difficulty in walking, not elsewhere classified: Secondary | ICD-10-CM

## 2015-05-04 DIAGNOSIS — M25562 Pain in left knee: Secondary | ICD-10-CM

## 2015-05-04 NOTE — Therapy (Signed)
[place] [address] [place], [place], [address] Phone: [phone]   Fax:  [phone]  Physical Therapy Treatment  Patient Details  Name: [name] MRN: [id number] Date of Birth: May 16, 1951 Referring Provider: Gaynelle Arabian  Encounter Date: 05/04/2015      PT End of Session - 05/04/15 1157    Visit Number 12   Date for PT Re-Evaluation 06/04/15   PT Start Time 1010   PT Stop Time 1115   PT Time Calculation (min) 65 min   Activity Tolerance Patient tolerated treatment well   Behavior During Therapy [facility] for tasks assessed/performed      Past Medical History  Diagnosis Date  . MVP (mitral valve prolapse)   . MR (mitral regurgitation)     severe; mitral valve repair in march 2009  . Atrial flutter ([place])     s/p ablation  . Hyperlipidemia   . CAD (coronary artery disease)     cath 06/2007 40% LM  . CVD (cerebrovascular disease)     59% right ICA, 49% left ICA; h/oTIA; fu study 8/11 with normal carotids  . History of colonoscopy   . Osteoarthritis     both knees  . Dysrhythmia   . Heart murmur   . Shortness of breath dyspnea     increased exertion   . History of bronchitis   . Gait abnormality   . Horseshoe kidney     Past Surgical History  Procedure Laterality Date  . Tonsillectomy  1960  . Mitral valve repair  06/2007  . Heart ablation    . Cardiac catheterization    . Back surgery      secondary to ruptured disc  . Total knee arthroplasty Bilateral 03/09/2015    Procedure: TOTAL KNEE BILATERAL;  Surgeon: Gaynelle Arabian, MD;  Location: [facility];  Service: Orthopedics;  Laterality: Bilateral;    There were no vitals filed for this visit.  Visit Diagnosis:  Knee stiffness, right  Knee stiffness, left  Difficulty walking  Arthralgia of both knees  Swelling of limb      Subjective Assessment - 05/04/15 1150    Subjective Patient with continued c/o stiffness, reports worse in the  AM   Currently in Pain? Yes   Pain Score 3    Pain Location Knee   Pain Orientation Right;Left                         [facility] Adult PT Treatment/Exercise - 05/04/15 0001    High Level Balance   High Level Balance Activities Side stepping;Backward walking;Tandem walking;Marching forwards   Lumbar Exercises: Stretches   Passive Hamstring Stretch 30 seconds;4 reps   Single Knee to Chest Stretch 10 seconds;5 reps   Lower Trunk Rotation 20 seconds;4 reps   Knee/Hip Exercises: Aerobic   Elliptical R7 I10 min 5   Recumbent Bike 5 mins    Nustep Nustep L 7 6 min   Knee/Hip Exercises: Machines for Strengthening   Cybex Knee Extension 10# 2x15 with focus on TKE   Cybex Knee Flexion 35# 2x 15   Cybex Leg Press 50# 2x15, then no weight just working on going as low as he can   Other Machine back extension with 25#   Knee/Hip Exercises: Standing   Other Standing Knee Exercises seated weighted ball thoracic extension.  feet on ball bridges   Electrical Stimulation   Electrical Stimulation Location knee R  Chartered certified accountant IFC   Electrical Stimulation Parameters tolerance   Electrical Stimulation Goals Pain   Vasopneumatic   Number Minutes Vasopneumatic  15 minutes   Vasopnuematic Location  Knee   Vasopneumatic Pressure High   Vasopneumatic Temperature  36                  PT Short Term Goals - 04/08/15 1107    PT SHORT TERM GOAL #1   Title independent with initial HEP   Status Achieved           PT Long Term Goals - 05/04/15 1159    PT LONG TERM GOAL #2   Title increase AROM of the knee to 5-120 degrees flexion   Status Partially Met               Plan - 05/04/15 1158    Clinical Impression Statement Patient remains stiff right > left knee.  He is walking better, but still very stiff in the back.  Stiff legged gait, could be a habit from how he walked for so long previoulsly   PT Next Visit Plan continue to work on gait and  function   Consulted and Agree with Plan of Care Patient        Problem List Patient Active Problem List   Diagnosis Date Noted  . Postoperative anemia due to acute blood loss 03/19/2015  . Status post bilateral knee replacements 03/14/2015  . OA (osteoarthritis) of knee 03/09/2015  . Pre-op examination 09/17/2014  . S/P mitral valve repair 03/18/2013  . Obesity (BMI 30-39.9) 02/24/2013  . HAND PAIN, RIGHT 06/05/2010  . CAD 12/28/2009  . CAROTID STENOSIS 12/28/2009  . CEREBROVASCULAR DISEASE 12/28/2009  . PRURITUS ANI 05/26/2009  . HYPERLIPIDEMIA, FAMILIAL 04/26/2009  . DEGENERATIVE JOINT DISEASE, KNEES, BILATERAL 09/27/2008  . Fatty liver disease, nonalcoholic 85/50/1586  . ABNORMAL TRANSAMINASE-LFT'S 12/29/2007  . COLONIC POLYPS, ADENOMATOUS, HX OF 12/25/2007  . 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 FEVER, HX OF 12/22/2007  . MITRAL REGURGITATION, SEVERE 03/19/2007  . HEMOCCULT POSITIVE STOOL 02/03/2007  . SYSTOLIC MURMUR 82/57/4935    [name], PT 05/04/2015, 12:01 PM  [place] [address] [place], [place], [address] Phone: [phone]   Fax:  [phone]  Name: [name] MRN: [id number] Date of Birth: 05/12/1951

## 2015-05-06 ENCOUNTER — Ambulatory Visit: Payer: Managed Care, Other (non HMO) | Admitting: Physical Therapy

## 2015-05-06 ENCOUNTER — Encounter: Payer: Self-pay | Admitting: Physical Therapy

## 2015-05-06 DIAGNOSIS — M25562 Pain in left knee: Secondary | ICD-10-CM

## 2015-05-06 DIAGNOSIS — M25662 Stiffness of left knee, not elsewhere classified: Secondary | ICD-10-CM

## 2015-05-06 DIAGNOSIS — M25561 Pain in right knee: Secondary | ICD-10-CM

## 2015-05-06 DIAGNOSIS — M7989 Other specified soft tissue disorders: Secondary | ICD-10-CM

## 2015-05-06 DIAGNOSIS — R262 Difficulty in walking, not elsewhere classified: Secondary | ICD-10-CM

## 2015-05-06 DIAGNOSIS — M25661 Stiffness of right knee, not elsewhere classified: Secondary | ICD-10-CM

## 2015-05-06 NOTE — Therapy (Signed)
[place] [address] [place], [place], [address] Phone: [phone]   Fax:  [phone]  Physical Therapy Treatment  Patient Details  Name: [name] MRN: [id number] Date of Birth: 1950/11/27 Referring Provider: Gaynelle Arabian  Encounter Date: 05/06/2015      PT End of Session - 05/06/15 1113    Visit Number 13   Date for PT Re-Evaluation 06/04/15   PT Start Time 1005   PT Stop Time 1115   PT Time Calculation (min) 70 min   Activity Tolerance Patient tolerated treatment well   Behavior During Therapy [facility] for tasks assessed/performed      Past Medical History  Diagnosis Date  . MVP (mitral valve prolapse)   . MR (mitral regurgitation)     severe; mitral valve repair in march 2009  . Atrial flutter ([place])     s/p ablation  . Hyperlipidemia   . CAD (coronary artery disease)     cath 06/2007 40% LM  . CVD (cerebrovascular disease)     59% right ICA, 49% left ICA; h/oTIA; fu study 8/11 with normal carotids  . History of colonoscopy   . Osteoarthritis     both knees  . Dysrhythmia   . Heart murmur   . Shortness of breath dyspnea     increased exertion   . History of bronchitis   . Gait abnormality   . Horseshoe kidney     Past Surgical History  Procedure Laterality Date  . Tonsillectomy  1960  . Mitral valve repair  06/2007  . Heart ablation    . Cardiac catheterization    . Back surgery      secondary to ruptured disc  . Total knee arthroplasty Bilateral 03/09/2015    Procedure: TOTAL KNEE BILATERAL;  Surgeon: Gaynelle Arabian, MD;  [facility];  Service: Orthopedics;  Laterality: Bilateral;    There were no vitals filed for this visit.  Visit Diagnosis:  Knee stiffness, right  Knee stiffness, left  Difficulty walking  Arthralgia of both knees  Swelling of limb      Subjective Assessment - 05/06/15 1043    Subjective REmains stiff in the trunk and in the knees with walking,  stiff and difficulty with him trying to go down stairs   Currently in Pain? Yes   Pain Score 3    Pain Location Back   Pain Orientation Lower            [facility] PT Assessment - 05/06/15 0001    AROM   Right Knee Extension 5   Right Knee Flexion 120   Left Knee Extension 3   Left Knee Flexion 120                     [facility] Adult PT Treatment/Exercise - 05/06/15 0001    Ambulation/Gait   Gait Comments Stairs step over step, gait outside to work on knee bend, some exaggerated march to help with the knee bending.   High Level Balance   High Level Balance Activities Side stepping;Backward walking;Tandem walking;Marching forwards   Lumbar Exercises: Stretches   Passive Hamstring Stretch 30 seconds;4 reps   Single Knee to Chest Stretch 10 seconds;5 reps   Lower Trunk Rotation 20 seconds;4 reps   ITB Stretch 20 seconds;4 reps   Piriformis Stretch 20 seconds;4 reps   Knee/Hip Exercises: Aerobic   Elliptical R7 I10 min 5   Nustep Nustep L 7 6 min  Knee/Hip Exercises: Machines for Strengthening   Cybex Knee Extension 10# 2x15 with focus on TKE   Cybex Knee Flexion 35# 2x 15   Cybex Leg Press 50# 2x15, then no weight just working on going as low as he can   Other Machine back extension with 25#   Knee/Hip Exercises: Standing   Other Standing Knee Exercises seated weighted ball thoracic extension.  feet on ball bridges   Knee/Hip Exercises: Seated   Sit to Sand 2 sets;15 reps;without UE support   Vasopneumatic   Number Minutes Vasopneumatic  15 minutes   Vasopnuematic Location  Knee   Vasopneumatic Pressure High   Vasopneumatic Temperature  36                  PT Short Term Goals - 04/08/15 1107    PT SHORT TERM GOAL #1   Title independent with initial HEP   Status Achieved           PT Long Term Goals - 05/04/15 1159    PT LONG TERM GOAL #2   Title increase AROM of the knee to 5-120 degrees flexion   Status Partially Met                Plan - 05/06/15 1114    Clinical Impression Statement Patient very stiff with gait, he is stiff in the knees with minimal flexion as he walks, his back is very upright, this is how he was prior to surgery but had more trunk side lean with walking.  He had great difficulty going down stairs due to knee stiffness, but is able to demonstrate great knee flexion.   PT Next Visit Plan continue to work on gait and function   Consulted and Agree with Plan of Care Patient        Problem List Patient Active Problem List   Diagnosis Date Noted  . Postoperative anemia due to acute blood loss 03/19/2015  . Status post bilateral knee replacements 03/14/2015  . OA (osteoarthritis) of knee 03/09/2015  . Pre-op examination 09/17/2014  . S/P mitral valve repair 03/18/2013  . Obesity (BMI 30-39.9) 02/24/2013  . HAND PAIN, RIGHT 06/05/2010  . CAD 12/28/2009  . CAROTID STENOSIS 12/28/2009  . CEREBROVASCULAR DISEASE 12/28/2009  . PRURITUS ANI 05/26/2009  . HYPERLIPIDEMIA, FAMILIAL 04/26/2009  . DEGENERATIVE JOINT DISEASE, KNEES, BILATERAL 09/27/2008  . Fatty liver disease, nonalcoholic 73/22/5672  . ABNORMAL TRANSAMINASE-LFT'S 12/29/2007  . COLONIC POLYPS, ADENOMATOUS, HX OF 12/25/2007  . 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 FEVER, HX OF 12/22/2007  . MITRAL REGURGITATION, SEVERE 03/19/2007  . HEMOCCULT POSITIVE STOOL 02/03/2007  . SYSTOLIC MURMUR 02/06/8021    [name], PT 05/06/2015, 11:23 AM  [place] [address] [address] [place], [place], [address] Phone: [phone]   Fax:  [phone]  Name: [name] MRN: [id number] Date of Birth: Aug 26, 1950

## 2015-05-09 ENCOUNTER — Encounter: Payer: Self-pay | Admitting: Physical Therapy

## 2015-05-09 ENCOUNTER — Ambulatory Visit: Payer: Managed Care, Other (non HMO) | Admitting: Physical Therapy

## 2015-05-09 DIAGNOSIS — M25662 Stiffness of left knee, not elsewhere classified: Secondary | ICD-10-CM

## 2015-05-09 DIAGNOSIS — R262 Difficulty in walking, not elsewhere classified: Secondary | ICD-10-CM

## 2015-05-09 DIAGNOSIS — M25661 Stiffness of right knee, not elsewhere classified: Secondary | ICD-10-CM

## 2015-05-09 DIAGNOSIS — M7989 Other specified soft tissue disorders: Secondary | ICD-10-CM

## 2015-05-09 NOTE — Therapy (Signed)
[place] [address] [place], [place], [address] Phone: [phone]   Fax:  [phone]  Physical Therapy Treatment  Patient Details  Name: [name] MRN: [id number] Date of Birth: 01/06/1951 Referring Provider: Gaynelle Arabian  Encounter Date: 05/09/2015      PT End of Session - 05/09/15 1115    Visit Number 14   Date for PT Re-Evaluation 06/04/15   PT Start Time 1012   PT Stop Time 1120   PT Time Calculation (min) 68 min   Activity Tolerance Patient tolerated treatment well   Behavior During Therapy [facility] for tasks assessed/performed      Past Medical History  Diagnosis Date  . MVP (mitral valve prolapse)   . MR (mitral regurgitation)     severe; mitral valve repair in march 2009  . Atrial flutter ([place])     s/p ablation  . Hyperlipidemia   . CAD (coronary artery disease)     cath 06/2007 40% LM  . CVD (cerebrovascular disease)     59% right ICA, 49% left ICA; h/oTIA; fu study 8/11 with normal carotids  . History of colonoscopy   . Osteoarthritis     both knees  . Dysrhythmia   . Heart murmur   . Shortness of breath dyspnea     increased exertion   . History of bronchitis   . Gait abnormality   . Horseshoe kidney     Past Surgical History  Procedure Laterality Date  . Tonsillectomy  1960  . Mitral valve repair  06/2007  . Heart ablation    . Cardiac catheterization    . Back surgery      secondary to ruptured disc  . Total knee arthroplasty Bilateral 03/09/2015    Procedure: TOTAL KNEE BILATERAL;  Surgeon: Gaynelle Arabian, MD;  Location: [facility];  Service: Orthopedics;  Laterality: Bilateral;    There were no vitals filed for this visit.  Visit Diagnosis:  Knee stiffness, right  Knee stiffness, left  Difficulty walking  Swelling of limb      Subjective Assessment - 05/09/15 1016    Subjective C/O mostly stiffness in the back and in the knees.   Currently in Pain? Yes   Pain  Score 3    Pain Location Knee   Pain Orientation Right;Left   Pain Descriptors / Indicators Aching;Sore;Tightness   Pain Type Surgical pain   Pain Onset More than a month ago   Pain Frequency Constant   Aggravating Factors  Reports very stiff in the mornings and after being up longer periods   Pain Relieving Factors gentle stretching and exercise            [facility] PT Assessment - 05/09/15 0001    AROM   Right Knee Extension 5   Right Knee Flexion 115   Left Knee Extension 5   Left Knee Flexion 115                     [facility] Adult PT Treatment/Exercise - 05/09/15 0001    Ambulation/Gait   Gait Comments Stairs step over step, gait outside to work on knee bend, some exaggerated march to help with the knee bending.   High Level Balance   High Level Balance Activities Side stepping;Backward walking;Tandem walking;Marching forwards   Lumbar Exercises: Stretches   Passive Hamstring Stretch 30 seconds;4 reps   Single Knee to Chest Stretch 10 seconds;5 reps   Lower Trunk Rotation 20  seconds;4 reps   ITB Stretch 20 seconds;4 reps   Piriformis Stretch 20 seconds;4 reps   Knee/Hip Exercises: Aerobic   Elliptical R7 I10 min 5   Nustep Nustep L 7 6 min   Knee/Hip Exercises: Machines for Strengthening   Cybex Knee Extension 10# 2x15 with focus on TKE   Cybex Knee Flexion 35# 2x 15   Cybex Leg Press 50# 2x15, then no weight just working on going as low as he can   Pensions consultant Lat pulls 45#   Knee/Hip Exercises: Seated   Sit to [employer] 2 sets;15 reps;without UE support   Vasopneumatic   Number Minutes Vasopneumatic  15 minutes   Vasopnuematic Location  Knee   Vasopneumatic Pressure High   Vasopneumatic Temperature  36                  PT Short Term Goals - 04/08/15 1107    PT SHORT TERM GOAL #1   Title independent with initial HEP   Status Achieved           PT Long Term Goals - 05/09/15 1119    PT LONG TERM GOAL #1   Title independent with advanced  HEP   Status Achieved   PT LONG TERM GOAL #2   Title increase AROM of the knee to 5-120 degrees flexion   Status Partially Met   PT LONG TERM GOAL #3   Title be able to return to work   Status On-going   PT LONG TERM GOAL #4   Title lift and carry 30#   Status On-going               Plan - 05/09/15 1116    Clinical Impression Statement Patient has been very stiff in the knees and in the back, his ROM is great for the knees, he is very stiff in the back and that has seemed to hold him back.  Can go down stairs step over step but has some difficulty.  C/O stiffness in the knees and back in the morning and toward the end of the day.   PT Next Visit Plan continue to work on gait and function   Consulted and Agree with Plan of Care Patient        Problem List Patient Active Problem List   Diagnosis Date Noted  . Postoperative anemia due to acute blood loss 03/19/2015  . Status post bilateral knee replacements 03/14/2015  . OA (osteoarthritis) of knee 03/09/2015  . Pre-op examination 09/17/2014  . S/P mitral valve repair 03/18/2013  . Obesity (BMI 30-39.9) 02/24/2013  . HAND PAIN, RIGHT 06/05/2010  . CAD 12/28/2009  . CAROTID STENOSIS 12/28/2009  . CEREBROVASCULAR DISEASE 12/28/2009  . PRURITUS ANI 05/26/2009  . HYPERLIPIDEMIA, FAMILIAL 04/26/2009  . DEGENERATIVE JOINT DISEASE, KNEES, BILATERAL 09/27/2008  . Fatty liver disease, nonalcoholic 99/77/4142  . ABNORMAL TRANSAMINASE-LFT'S 12/29/2007  . COLONIC POLYPS, ADENOMATOUS, HX OF 12/25/2007  . 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 FEVER, HX OF 12/22/2007  . MITRAL REGURGITATION, SEVERE 03/19/2007  . HEMOCCULT POSITIVE STOOL 02/03/2007  . SYSTOLIC MURMUR 39/53/2023    [name], PT 05/09/2015, 11:20 AM  [place] [address] [facility] [place], [place], [address] Phone: [phone]   Fax:  [phone]  Name: ELYJAH HAZAN MRN: [id number] Date of  Birth: [birth year]

## 2015-05-11 ENCOUNTER — Encounter: Payer: Self-pay | Admitting: Physical Therapy

## 2015-05-11 ENCOUNTER — Ambulatory Visit: Payer: Managed Care, Other (non HMO) | Admitting: Physical Therapy

## 2015-05-11 DIAGNOSIS — M25561 Pain in right knee: Secondary | ICD-10-CM

## 2015-05-11 DIAGNOSIS — M25562 Pain in left knee: Secondary | ICD-10-CM

## 2015-05-11 DIAGNOSIS — R262 Difficulty in walking, not elsewhere classified: Secondary | ICD-10-CM

## 2015-05-11 DIAGNOSIS — M25661 Stiffness of right knee, not elsewhere classified: Secondary | ICD-10-CM | POA: Diagnosis not present

## 2015-05-11 DIAGNOSIS — M7989 Other specified soft tissue disorders: Secondary | ICD-10-CM

## 2015-05-11 DIAGNOSIS — M25662 Stiffness of left knee, not elsewhere classified: Secondary | ICD-10-CM

## 2015-05-11 NOTE — Therapy (Signed)
[address] [address] [place], [place], [address] Phone: [phone]   Fax:  [phone]  Physical Therapy Treatment  Patient Details  Name: [name] MRN: [id number] Date of Birth: 10-Feb-1951 Referring Provider: Gaynelle Arabian  Encounter Date: 05/11/2015      PT End of Session - 05/11/15 1148    Visit Number 15   Date for PT Re-Evaluation 06/04/15   PT Start Time 1001   PT Stop Time 1110   PT Time Calculation (min) 69 min   Activity Tolerance Patient tolerated treatment well   Behavior During Therapy [facility] for tasks assessed/performed      Past Medical History  Diagnosis Date  . MVP (mitral valve prolapse)   . MR (mitral regurgitation)     severe; mitral valve repair in march 2009  . Atrial flutter ([place])     s/p ablation  . Hyperlipidemia   . CAD (coronary artery disease)     cath 06/2007 40% LM  . CVD (cerebrovascular disease)     59% right ICA, 49% left ICA; h/oTIA; fu study 8/11 with normal carotids  . History of colonoscopy   . Osteoarthritis     both knees  . Dysrhythmia   . Heart murmur   . Shortness of breath dyspnea     increased exertion   . History of bronchitis   . Gait abnormality   . Horseshoe kidney     Past Surgical History  Procedure Laterality Date  . Tonsillectomy  1960  . Mitral valve repair  06/2007  . Heart ablation    . Cardiac catheterization    . Back surgery      secondary to ruptured disc  . Total knee arthroplasty Bilateral 03/09/2015    Procedure: TOTAL KNEE BILATERAL;  Surgeon: Gaynelle Arabian, MD;  Location: [facility];  Service: Orthopedics;  Laterality: Bilateral;    There were no vitals filed for this visit.  Visit Diagnosis:  Knee stiffness, right  Knee stiffness, left  Difficulty walking  Swelling of limb  Arthralgia of both knees      Subjective Assessment - 05/11/15 1009    Subjective Patient saw MD, reports that he may return to work toward  the end of January.  He is to continue with PT per MD   Currently in Pain? Yes   Pain Score 2    Pain Location Knee   Pain Orientation Right   Pain Descriptors / Indicators Sore;Tightness                         [facility] Adult PT Treatment/Exercise - 05/11/15 0001    Ambulation/Gait   Gait Comments Stairs step over step, gait outside to work on knee bend, some exaggerated march to help with the knee bending., walking around building two laps   High Level Balance   High Level Balance Activities Tandem walking;Marching forwards;Backward walking   Lumbar Exercises: Stretches   Passive Hamstring Stretch 30 seconds;4 reps   Single Knee to Chest Stretch 10 seconds;5 reps   Lower Trunk Rotation 20 seconds;4 reps   ITB Stretch 20 seconds;4 reps   Piriformis Stretch 20 seconds;4 reps   Knee/Hip Exercises: Aerobic   Elliptical R7 I10 min 5   Nustep Nustep L 7 6 min   Knee/Hip Exercises: Machines for Strengthening   Cybex Leg Press 50# 2x15, then no weight just working on going as low as he can  Other Machine Lat pulls 45#   Chief Strategy Officer [employer]   Electrical Stimulation Parameters tolerance   Electrical Stimulation Goals Pain   Vasopneumatic   Number Minutes Vasopneumatic  15 minutes   Vasopnuematic Location  Knee   Vasopneumatic Pressure High   Vasopneumatic Temperature  36                  PT Short Term Goals - 04/08/15 1107    PT SHORT TERM GOAL #1   Title independent with initial HEP   Status Achieved           PT Long Term Goals - 05/09/15 1119    PT LONG TERM GOAL #1   Title independent with advanced HEP   Status Achieved   PT LONG TERM GOAL #2   Title increase AROM of the knee to 5-120 degrees flexion   Status Partially Met   PT LONG TERM GOAL #3   Title be able to return to work   Status On-going   PT LONG TERM GOAL #4   Title lift and carry 30#   Status  On-going               Plan - 05/11/15 1148    Clinical Impression Statement Walking outside and on stairs the patient seemed to loosen up some and have better gait, less stiff in the knees and in the back.   PT Next Visit Plan continue to work on gait and function   Consulted and Agree with Plan of Care Patient        Problem List Patient Active Problem List   Diagnosis Date Noted  . Postoperative anemia due to acute blood loss 03/19/2015  . Status post bilateral knee replacements 03/14/2015  . OA (osteoarthritis) of knee 03/09/2015  . Pre-op examination 09/17/2014  . S/P mitral valve repair 03/18/2013  . Obesity (BMI 30-39.9) 02/24/2013  . HAND PAIN, RIGHT 06/05/2010  . CAD 12/28/2009  . CAROTID STENOSIS 12/28/2009  . CEREBROVASCULAR DISEASE 12/28/2009  . PRURITUS ANI 05/26/2009  . HYPERLIPIDEMIA, FAMILIAL 04/26/2009  . DEGENERATIVE JOINT DISEASE, KNEES, BILATERAL 09/27/2008  . Fatty liver disease, nonalcoholic 04/11/6243  . ABNORMAL TRANSAMINASE-LFT'S 12/29/2007  . COLONIC POLYPS, ADENOMATOUS, HX OF 12/25/2007  . 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 FEVER, HX OF 12/22/2007  . MITRAL REGURGITATION, SEVERE 03/19/2007  . HEMOCCULT POSITIVE STOOL 02/03/2007  . SYSTOLIC MURMUR 69/50/7225    [name], PT 05/11/2015, 11:51 AM  [place] [address] [address] [place], [place], [address] Phone: [phone]   Fax:  [phone]  Name: [name] MRN: [id number] Date of Birth: [birth year]

## 2015-05-13 ENCOUNTER — Encounter: Payer: Self-pay | Admitting: Physical Therapy

## 2015-05-13 ENCOUNTER — Ambulatory Visit: Payer: Managed Care, Other (non HMO) | Admitting: Physical Therapy

## 2015-05-13 DIAGNOSIS — R262 Difficulty in walking, not elsewhere classified: Secondary | ICD-10-CM

## 2015-05-13 DIAGNOSIS — M25661 Stiffness of right knee, not elsewhere classified: Secondary | ICD-10-CM

## 2015-05-13 DIAGNOSIS — M7989 Other specified soft tissue disorders: Secondary | ICD-10-CM

## 2015-05-13 DIAGNOSIS — M25662 Stiffness of left knee, not elsewhere classified: Secondary | ICD-10-CM

## 2015-05-13 DIAGNOSIS — M25561 Pain in right knee: Secondary | ICD-10-CM

## 2015-05-13 DIAGNOSIS — M25562 Pain in left knee: Secondary | ICD-10-CM

## 2015-05-13 NOTE — Therapy (Signed)
[address] [place], [place], [address] Phone: [phone]   Fax:  [phone]  Physical Therapy Treatment  Patient Details  Name: [name] MRN: [id number] Date of Birth: 22-Jun-1950 Referring Provider: Gaynelle Arabian  Encounter Date: 05/13/2015      PT End of Session - 05/13/15 1108    Visit Number 16   Date for PT Re-Evaluation 06/04/15   PT Start Time 1007   PT Stop Time 1120   PT Time Calculation (min) 73 min   Activity Tolerance Patient tolerated treatment well   Behavior During Therapy [facility] for tasks assessed/performed      Past Medical History  Diagnosis Date  . MVP (mitral valve prolapse)   . MR (mitral regurgitation)     severe; mitral valve repair in march 2009  . Atrial flutter ([place])     s/p ablation  . Hyperlipidemia   . CAD (coronary artery disease)     cath 06/2007 40% LM  . CVD (cerebrovascular disease)     59% right ICA, 49% left ICA; h/oTIA; fu study 8/11 with normal carotids  . History of colonoscopy   . Osteoarthritis     both knees  . Dysrhythmia   . Heart murmur   . Shortness of breath dyspnea     increased exertion   . History of bronchitis   . Gait abnormality   . Horseshoe kidney     Past Surgical History  Procedure Laterality Date  . Tonsillectomy  1960  . Mitral valve repair  06/2007  . Heart ablation    . Cardiac catheterization    . Back surgery      secondary to ruptured disc  . Total knee arthroplasty Bilateral 03/09/2015    Procedure: TOTAL KNEE BILATERAL;  Surgeon: Gaynelle Arabian, MD;  Location: [facility];  Service: Orthopedics;  Laterality: Bilateral;    There were no vitals filed for this visit.  Visit Diagnosis:  Knee stiffness, right  Knee stiffness, left  Difficulty walking  Swelling of limb  Arthralgia of both knees      Subjective Assessment - 05/13/15 1054    Subjective Patient continues to report very stiff back in the AM.  I am  walking better.   Currently in Pain? Yes   Pain Score 2    Pain Location Back   Pain Orientation Lower            [facility] PT Assessment - 05/13/15 0001    AROM   Right Knee Extension 5   Right Knee Flexion 116   Left Knee Extension 3   Left Knee Flexion 120                     [facility] Adult PT Treatment/Exercise - 05/13/15 0001    Ambulation/Gait   Gait Comments Stairs step over step, gait outside to work on knee bend, some exaggerated march to help with the knee bending., walking around building two laps   Lumbar Exercises: Stretches   Passive Hamstring Stretch 30 seconds;4 reps   Single Knee to Chest Stretch 10 seconds;5 reps   Lower Trunk Rotation 20 seconds;4 reps   ITB Stretch 20 seconds;4 reps   Piriformis Stretch 20 seconds;4 reps   Lumbar Exercises: Machines for Strengthening   Other Lumbar Machine Exercise seated row 45#, lats 45#    Knee/Hip Exercises: Aerobic   Stationary Bike 4 minutes   Elliptical R7 I10 min 5  Nustep Nustep L 7 6 min   Knee/Hip Exercises: Machines for Strengthening   Cybex Knee Extension 10# 2x15 with focus on TKEalso single leg with 10#   Cybex Leg Press 50# 2x15, then no weight just working on going as low as he can   Chief Strategy Officer [employer]   Electrical Stimulation Parameters tolerance   Electrical Stimulation Goals Pain   Vasopneumatic   Number Minutes Vasopneumatic  15 minutes   Vasopnuematic Location  Knee   Vasopneumatic Pressure High   Vasopneumatic Temperature  36                  PT Short Term Goals - 04/08/15 1107    PT SHORT TERM GOAL #1   Title independent with initial HEP   Status Achieved           PT Long Term Goals - 05/13/15 1111    PT LONG TERM GOAL #2   Title increase AROM of the knee to 5-120 degrees flexion   Status Partially Met   PT LONG TERM GOAL #3   Title be able to return to work   Status On-going                Plan - 05/13/15 1109    Clinical Impression Statement Patient has difficulty descending stairs but is able to do step over step.  He appears less stiff in the upper body.  ROM is great   PT Next Visit Plan continue to work on gait and function   Consulted and Agree with Plan of Care Patient        Problem List Patient Active Problem List   Diagnosis Date Noted  . Postoperative anemia due to acute blood loss 03/19/2015  . Status post bilateral knee replacements 03/14/2015  . OA (osteoarthritis) of knee 03/09/2015  . Pre-op examination 09/17/2014  . S/P mitral valve repair 03/18/2013  . Obesity (BMI 30-39.9) 02/24/2013  . HAND PAIN, RIGHT 06/05/2010  . CAD 12/28/2009  . CAROTID STENOSIS 12/28/2009  . CEREBROVASCULAR DISEASE 12/28/2009  . PRURITUS ANI 05/26/2009  . HYPERLIPIDEMIA, FAMILIAL 04/26/2009  . DEGENERATIVE JOINT DISEASE, KNEES, BILATERAL 09/27/2008  . Fatty liver disease, nonalcoholic 67/20/9470  . ABNORMAL TRANSAMINASE-LFT'S 12/29/2007  . COLONIC POLYPS, ADENOMATOUS, HX OF 12/25/2007  . 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 FEVER, HX OF 12/22/2007  . MITRAL REGURGITATION, SEVERE 03/19/2007  . HEMOCCULT POSITIVE STOOL 02/03/2007  . SYSTOLIC MURMUR 96/28/3662    [name], PT 05/13/2015, 11:12 AM  [facility] [address] [place], [place], [address] Phone: [phone]   Fax:  [phone]  Name: [name] MRN: [id number] Date of Birth: 1950/05/22

## 2015-05-17 ENCOUNTER — Ambulatory Visit: Payer: Managed Care, Other (non HMO) | Admitting: Physical Therapy

## 2015-05-17 ENCOUNTER — Encounter: Payer: Self-pay | Admitting: Physical Therapy

## 2015-05-17 DIAGNOSIS — M25661 Stiffness of right knee, not elsewhere classified: Secondary | ICD-10-CM

## 2015-05-17 DIAGNOSIS — M25662 Stiffness of left knee, not elsewhere classified: Secondary | ICD-10-CM

## 2015-05-17 DIAGNOSIS — M7989 Other specified soft tissue disorders: Secondary | ICD-10-CM

## 2015-05-17 DIAGNOSIS — R262 Difficulty in walking, not elsewhere classified: Secondary | ICD-10-CM

## 2015-05-17 DIAGNOSIS — M25561 Pain in right knee: Secondary | ICD-10-CM

## 2015-05-17 DIAGNOSIS — M25562 Pain in left knee: Secondary | ICD-10-CM

## 2015-05-17 NOTE — Therapy (Signed)
[place] [address] [place], [place], [address] Phone: [phone]   Fax:  [phone]  Physical Therapy Treatment  Patient Details  Name: [name] MRN: [id number] Date of Birth: December 22, 1950 Referring Provider: Gaynelle Arabian  Encounter Date: 05/17/2015      PT End of Session - 05/17/15 1157    Visit Number 17   Date for PT Re-Evaluation 06/04/15   PT Start Time 1010   PT Stop Time 1115   PT Time Calculation (min) 65 min   Activity Tolerance Patient tolerated treatment well   Behavior During Therapy [facility] for tasks assessed/performed      Past Medical History  Diagnosis Date  . MVP (mitral valve prolapse)   . MR (mitral regurgitation)     severe; mitral valve repair in march 2009  . Atrial flutter ([place])     s/p ablation  . Hyperlipidemia   . CAD (coronary artery disease)     cath 06/2007 40% LM  . CVD (cerebrovascular disease)     59% right ICA, 49% left ICA; h/oTIA; fu study 8/11 with normal carotids  . History of colonoscopy   . Osteoarthritis     both knees  . Dysrhythmia   . Heart murmur   . Shortness of breath dyspnea     increased exertion   . History of bronchitis   . Gait abnormality   . Horseshoe kidney     Past Surgical History  Procedure Laterality Date  . Tonsillectomy  1960  . Mitral valve repair  06/2007  . Heart ablation    . Cardiac catheterization    . Back surgery      secondary to ruptured disc  . Total knee arthroplasty Bilateral 03/09/2015    Procedure: TOTAL KNEE BILATERAL;  Surgeon: Gaynelle Arabian, MD;  [facility];  Service: Orthopedics;  Laterality: Bilateral;    There were no vitals filed for this visit.  Visit Diagnosis:  Knee stiffness, right  Knee stiffness, left  Difficulty walking  Swelling of limb  Arthralgia of both knees      Subjective Assessment - 05/17/15 1020    Subjective Patient reports that he is starting to try to be more active  and do more at home.   Currently in Pain? Yes   Pain Score 3    Pain Location Back   Pain Orientation Lower   Aggravating Factors  sitting and in the mornings   Pain Relieving Factors treatments                         [facility] Adult PT Treatment/Exercise - 05/17/15 0001    Ambulation/Gait   Gait Comments Stairs step over step, gait outside to work on knee bend, some exaggerated march to help with the knee bending., walking around building two laps   High Level Balance   High Level Balance Activities Tandem walking;Marching forwards;Backward walking   Lumbar Exercises: Stretches   Passive Hamstring Stretch 30 seconds;4 reps   Lower Trunk Rotation 20 seconds;4 reps   ITB Stretch 20 seconds;4 reps   Piriformis Stretch 20 seconds;4 reps   Knee/Hip Exercises: Aerobic   Stationary Bike 4 minutes, level 3   Elliptical R7 I17 min 5   Knee/Hip Exercises: Machines for Strengthening   Cybex Knee Extension 10# 2x15 with focus on TKEalso single leg with 10#   Cybex Knee Flexion 45# 2x15   Other Machine Lat pulls 45#  Chief Strategy Officer IFC   Electrical Stimulation Parameters tolerance   Electrical Stimulation Goals Pain   Vasopneumatic   Number Minutes Vasopneumatic  15 minutes   Vasopnuematic Location  Knee   Vasopneumatic Pressure High   Vasopneumatic Temperature  36                  PT Short Term Goals - 04/08/15 1107    PT SHORT TERM GOAL #1   Title independent with initial HEP   Status Achieved           PT Long Term Goals - 05/13/15 1111    PT LONG TERM GOAL #2   Title increase AROM of the knee to 5-120 degrees flexion   Status Partially Met   PT LONG TERM GOAL #3   Title be able to return to work   Status On-going               Plan - 05/17/15 1157    Clinical Impression Statement Patient with difficulty maintaining a good pace when walking around the  building, he is very short of breath with this.   PT Next Visit Plan continue to work on gait and function   Consulted and Agree with Plan of Care Patient        Problem List Patient Active Problem List   Diagnosis Date Noted  . Postoperative anemia due to acute blood loss 03/19/2015  . Status post bilateral knee replacements 03/14/2015  . OA (osteoarthritis) of knee 03/09/2015  . Pre-op examination 09/17/2014  . S/P mitral valve repair 03/18/2013  . Obesity (BMI 30-39.9) 02/24/2013  . HAND PAIN, RIGHT 06/05/2010  . CAD 12/28/2009  . CAROTID STENOSIS 12/28/2009  . CEREBROVASCULAR DISEASE 12/28/2009  . PRURITUS ANI 05/26/2009  . HYPERLIPIDEMIA, FAMILIAL 04/26/2009  . DEGENERATIVE JOINT DISEASE, KNEES, BILATERAL 09/27/2008  . Fatty liver disease, nonalcoholic 37/94/3276  . ABNORMAL TRANSAMINASE-LFT'S 12/29/2007  . COLONIC POLYPS, ADENOMATOUS, HX OF 12/25/2007  . 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 FEVER, HX OF 12/22/2007  . MITRAL REGURGITATION, SEVERE 03/19/2007  . HEMOCCULT POSITIVE STOOL 02/03/2007  . SYSTOLIC MURMUR 14/70/9295    [name], PT 05/17/2015, 11:59 AM  [address] [address] [place], [place], [address] Phone: [phone]   Fax:  [phone]  Name: [name] MRN: [id number] Date of Birth: 1950/09/02

## 2015-05-18 ENCOUNTER — Ambulatory Visit: Payer: Managed Care, Other (non HMO) | Admitting: Physical Therapy

## 2015-05-18 ENCOUNTER — Encounter: Payer: Self-pay | Admitting: Physical Therapy

## 2015-05-18 DIAGNOSIS — M545 Low back pain, unspecified: Secondary | ICD-10-CM

## 2015-05-18 DIAGNOSIS — R262 Difficulty in walking, not elsewhere classified: Secondary | ICD-10-CM

## 2015-05-18 DIAGNOSIS — M25561 Pain in right knee: Secondary | ICD-10-CM

## 2015-05-18 DIAGNOSIS — M25661 Stiffness of right knee, not elsewhere classified: Secondary | ICD-10-CM

## 2015-05-18 DIAGNOSIS — M7989 Other specified soft tissue disorders: Secondary | ICD-10-CM

## 2015-05-18 DIAGNOSIS — R609 Edema, unspecified: Secondary | ICD-10-CM

## 2015-05-18 DIAGNOSIS — M25662 Stiffness of left knee, not elsewhere classified: Secondary | ICD-10-CM

## 2015-05-18 DIAGNOSIS — Z7409 Other reduced mobility: Secondary | ICD-10-CM

## 2015-05-18 DIAGNOSIS — M2560 Stiffness of unspecified joint, not elsewhere classified: Secondary | ICD-10-CM

## 2015-05-18 DIAGNOSIS — M25562 Pain in left knee: Secondary | ICD-10-CM

## 2015-05-18 DIAGNOSIS — M5440 Lumbago with sciatica, unspecified side: Secondary | ICD-10-CM

## 2015-05-18 NOTE — Therapy (Signed)
[address] [place], [place], [address] Phone: [phone]   Fax:  [phone]  Physical Therapy Treatment  Patient Details  Name: [name] MRN: [id number] Date of Birth: 09/26/1950 Referring Provider: Gaynelle Arabian  Encounter Date: 05/18/2015      PT End of Session - 05/18/15 1110    Visit Number 18   Date for PT Re-Evaluation 06/04/15   PT Start Time 9983   PT Stop Time 1128   PT Time Calculation (min) 73 min   Activity Tolerance Patient tolerated treatment well   Behavior During Therapy [facility] for tasks assessed/performed      Past Medical History  Diagnosis Date  . MVP (mitral valve prolapse)   . MR (mitral regurgitation)     severe; mitral valve repair in march 2009  . Atrial flutter ([place])     s/p ablation  . Hyperlipidemia   . CAD (coronary artery disease)     cath 06/2007 40% LM  . CVD (cerebrovascular disease)     59% right ICA, 49% left ICA; h/oTIA; fu study 8/11 with normal carotids  . History of colonoscopy   . Osteoarthritis     both knees  . Dysrhythmia   . Heart murmur   . Shortness of breath dyspnea     increased exertion   . History of bronchitis   . Gait abnormality   . Horseshoe kidney     Past Surgical History  Procedure Laterality Date  . Tonsillectomy  1960  . Mitral valve repair  06/2007  . Heart ablation    . Cardiac catheterization    . Back surgery      secondary to ruptured disc  . Total knee arthroplasty Bilateral 03/09/2015    Procedure: TOTAL KNEE BILATERAL;  Surgeon: Gaynelle Arabian, MD;  Location: [facility];  Service: Orthopedics;  Laterality: Bilateral;    There were no vitals filed for this visit.  Visit Diagnosis:  Difficulty walking  Knee stiffness, left  Knee stiffness, right  Arthralgia of both knees  Swelling of limb  Swelling  Midline low back pain without sciatica  Stiffness due to immobility  Midline low back pain with  sciatica, sciatica laterality unspecified      Subjective Assessment - 05/18/15 1016    Subjective "Not too bad"    Currently in Pain? Yes   Pain Score 1    Pain Location Knee   Pain Orientation Left;Right                         [facility] Adult PT Treatment/Exercise - 05/18/15 0001    Ambulation/Gait   Gait Comments Negotiated around building twice up and down slopes and uneven terrain > 578f, Does fatigue easily    Lumbar Exercises: Stretches   Passive Hamstring Stretch 4 reps;10 seconds   Single Knee to Chest Stretch 3 reps;10 seconds   Piriformis Stretch 2 reps;10 seconds   Knee/Hip Exercises: Aerobic   Stationary Bike 4 minutes, level 3   Elliptical R7 I17 min 6   Knee/Hip Exercises: Machines for Strengthening   Cybex Knee Extension #15 2x10    Cybex Knee Flexion 45# 2x15   Cybex Leg Press 50# 2x15   Knee/Hip Exercises: Standing   Forward Step Up 1 set;Both;10 reps   Knee/Hip Exercises: Seated   Sit to Sand 10 reps;2 sets  #10    Vasopneumatic   Number Minutes Vasopneumatic  15 minutes  Vasopnuematic Location  Knee   Vasopneumatic Pressure High   Vasopneumatic Temperature  36                  PT Short Term Goals - 04/08/15 1107    PT SHORT TERM GOAL #1   Title independent with initial HEP   Status Achieved           PT Long Term Goals - 05/13/15 1111    PT LONG TERM GOAL #2   Title increase AROM of the knee to 5-120 degrees flexion   Status Partially Met   PT LONG TERM GOAL #3   Title be able to return to work   Status On-going               Plan - 05/18/15 1110    Clinical Impression Statement Pt continues to fatigue easily when walking around building. Tight bilat LE noted with stretching. Does demo weakness when descending stairs R >L   Pt will benefit from skilled therapeutic intervention in order to improve on the following deficits Abnormal gait;Decreased mobility;Decreased range of motion;Decreased  strength;Difficulty walking;Impaired flexibility;Pain   Rehab Potential Good   PT Frequency 3x / week   PT Duration 4 weeks   PT Treatment/Interventions Electrical Stimulation;Moist Heat;Ultrasound;Functional mobility training;Patient/family education;Therapeutic exercise;Therapeutic activities;Manual techniques;Vestibular   PT Next Visit Plan continue to work on gait and function        Problem List Patient Active Problem List   Diagnosis Date Noted  . Postoperative anemia due to acute blood loss 03/19/2015  . Status post bilateral knee replacements 03/14/2015  . OA (osteoarthritis) of knee 03/09/2015  . Pre-op examination 09/17/2014  . S/P mitral valve repair 03/18/2013  . Obesity (BMI 30-39.9) 02/24/2013  . HAND PAIN, RIGHT 06/05/2010  . CAD 12/28/2009  . CAROTID STENOSIS 12/28/2009  . CEREBROVASCULAR DISEASE 12/28/2009  . PRURITUS ANI 05/26/2009  . HYPERLIPIDEMIA, FAMILIAL 04/26/2009  . DEGENERATIVE JOINT DISEASE, KNEES, BILATERAL 09/27/2008  . Fatty liver disease, nonalcoholic 34/35/6861  . ABNORMAL TRANSAMINASE-LFT'S 12/29/2007  . COLONIC POLYPS, ADENOMATOUS, HX OF 12/25/2007  . 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 FEVER, HX OF 12/22/2007  . MITRAL REGURGITATION, SEVERE 03/19/2007  . HEMOCCULT POSITIVE STOOL 02/03/2007  . SYSTOLIC MURMUR 68/37/2902    Scot Jun, PTA  05/18/2015, 11:16 AM  [address] [place] [place], [place], [address] Phone: [phone]   Fax:  [phone]  Name: GIANKARLO LEAMER MRN: [id number] Date of Birth: 1951-03-02

## 2015-05-20 ENCOUNTER — Ambulatory Visit: Payer: Managed Care, Other (non HMO) | Admitting: Physical Therapy

## 2015-05-20 ENCOUNTER — Encounter: Payer: Self-pay | Admitting: Physical Therapy

## 2015-05-20 DIAGNOSIS — M7989 Other specified soft tissue disorders: Secondary | ICD-10-CM

## 2015-05-20 DIAGNOSIS — M25661 Stiffness of right knee, not elsewhere classified: Secondary | ICD-10-CM | POA: Diagnosis not present

## 2015-05-20 DIAGNOSIS — M25562 Pain in left knee: Secondary | ICD-10-CM

## 2015-05-20 DIAGNOSIS — R262 Difficulty in walking, not elsewhere classified: Secondary | ICD-10-CM

## 2015-05-20 DIAGNOSIS — M25662 Stiffness of left knee, not elsewhere classified: Secondary | ICD-10-CM

## 2015-05-20 DIAGNOSIS — M25561 Pain in right knee: Secondary | ICD-10-CM

## 2015-05-20 NOTE — Therapy (Signed)
[place] [address] [place], [place], [address] Phone: [phone]   Fax:  [phone]  Physical Therapy Treatment  Patient Details  Name: [name] MRN: [id number] Date of Birth: 14-Apr-1951 Referring Provider: Gaynelle Arabian  Encounter Date: 05/20/2015      PT End of Session - 05/20/15 1052    Visit Number 19   Date for PT Re-Evaluation 06/04/15   PT Start Time 0925   PT Stop Time 1047   PT Time Calculation (min) 82 min   Activity Tolerance Patient limited by pain   Behavior During Therapy [facility] for tasks assessed/performed      Past Medical History  Diagnosis Date  . MVP (mitral valve prolapse)   . MR (mitral regurgitation)     severe; mitral valve repair in march 2009  . Atrial flutter ([place])     s/p ablation  . Hyperlipidemia   . CAD (coronary artery disease)     cath 06/2007 40% LM  . CVD (cerebrovascular disease)     59% right ICA, 49% left ICA; h/oTIA; fu study 8/11 with normal carotids  . History of colonoscopy   . Osteoarthritis     both knees  . Dysrhythmia   . Heart murmur   . Shortness of breath dyspnea     increased exertion   . History of bronchitis   . Gait abnormality   . Horseshoe kidney     Past Surgical History  Procedure Laterality Date  . Tonsillectomy  1960  . Mitral valve repair  06/2007  . Heart ablation    . Cardiac catheterization    . Back surgery      secondary to ruptured disc  . Total knee arthroplasty Bilateral 03/09/2015    Procedure: TOTAL KNEE BILATERAL;  Surgeon: Gaynelle Arabian, MD;  Location: [facility];  Service: Orthopedics;  Laterality: Bilateral;    There were no vitals filed for this visit.  Visit Diagnosis:  Difficulty walking  Knee stiffness, left  Knee stiffness, right  Arthralgia of both knees  Swelling of limb      Subjective Assessment - 05/20/15 0938    Subjective Had some pain in my right hip this morning   Currently in Pain? Yes    Pain Score 3    Pain Location Knee   Pain Orientation Right                         [facility] Adult PT Treatment/Exercise - 05/20/15 0001    Ambulation/Gait   Gait Comments stairs step over step, worked on him lifting 20# box from floor and carrying to simulate work.   Lumbar Exercises: Stretches   Passive Hamstring Stretch 4 reps;10 seconds   Lower Trunk Rotation 20 seconds;4 reps   Quad Stretch 20 seconds;4 reps   ITB Stretch 20 seconds;4 reps   Piriformis Stretch 2 reps;10 seconds   Lumbar Exercises: Machines for Strengthening   Other Lumbar Machine Exercise seated row 55#, lats 55#    Lumbar Exercises: Standing   Other Standing Lumbar Exercises 4" step up, overs and side a lot of cues to decrease compensation, tried to have him only use the heel so he would not cheat with the toe   Knee/Hip Exercises: Aerobic   Stationary Bike 4 minutes, level 3   Elliptical R7 I17 min 6   Knee/Hip Exercises: Machines for Strengthening   Cybex Leg Press 50# 2x15   Vasopneumatic  Number Minutes Vasopneumatic  15 minutes   Vasopnuematic Location  Knee   Vasopneumatic Pressure High   Vasopneumatic Temperature  36                  PT Short Term Goals - 04/08/15 1107    PT SHORT TERM GOAL #1   Title independent with initial HEP   Status Achieved           PT Long Term Goals - 05/13/15 1111    PT LONG TERM GOAL #2   Title increase AROM of the knee to 5-120 degrees flexion   Status Partially Met   PT LONG TERM GOAL #3   Title be able to return to work   Status On-going               Plan - 05/20/15 1053    Clinical Impression Statement Patient with difficulty in functional situations, great difficulty on stairs, had difficulty with a 4" step and with lifting and carrying a 20# box   PT Next Visit Plan needs more functional strength, we will have to find out his insurance coverage, but I feel that he needs the functional strength   Consulted and  Agree with Plan of Care Patient        Problem List Patient Active Problem List   Diagnosis Date Noted  . Postoperative anemia due to acute blood loss 03/19/2015  . Status post bilateral knee replacements 03/14/2015  . OA (osteoarthritis) of knee 03/09/2015  . Pre-op examination 09/17/2014  . S/P mitral valve repair 03/18/2013  . Obesity (BMI 30-39.9) 02/24/2013  . HAND PAIN, RIGHT 06/05/2010  . CAD 12/28/2009  . CAROTID STENOSIS 12/28/2009  . CEREBROVASCULAR DISEASE 12/28/2009  . PRURITUS ANI 05/26/2009  . HYPERLIPIDEMIA, FAMILIAL 04/26/2009  . DEGENERATIVE JOINT DISEASE, KNEES, BILATERAL 09/27/2008  . Fatty liver disease, nonalcoholic 23/95/3202  . ABNORMAL TRANSAMINASE-LFT'S 12/29/2007  . COLONIC POLYPS, ADENOMATOUS, HX OF 12/25/2007  . 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 FEVER, HX OF 12/22/2007  . MITRAL REGURGITATION, SEVERE 03/19/2007  . HEMOCCULT POSITIVE STOOL 02/03/2007  . SYSTOLIC MURMUR 33/43/5686    [name], PT 05/20/2015, 10:56 AM  [place] [address] [place], [place], [address] Phone: [phone]   Fax:  [phone]  Name: [name] MRN: [id number] Date of Birth: 16-Apr-1951

## 2015-05-25 ENCOUNTER — Ambulatory Visit: Payer: Managed Care, Other (non HMO) | Admitting: Physical Therapy

## 2015-05-27 ENCOUNTER — Encounter: Payer: Self-pay | Admitting: Physical Therapy

## 2015-05-27 ENCOUNTER — Ambulatory Visit: Payer: Managed Care, Other (non HMO) | Attending: Family Medicine | Admitting: Physical Therapy

## 2015-05-27 DIAGNOSIS — M25661 Stiffness of right knee, not elsewhere classified: Secondary | ICD-10-CM | POA: Diagnosis present

## 2015-05-27 DIAGNOSIS — R262 Difficulty in walking, not elsewhere classified: Secondary | ICD-10-CM | POA: Diagnosis not present

## 2015-05-27 DIAGNOSIS — M25662 Stiffness of left knee, not elsewhere classified: Secondary | ICD-10-CM | POA: Diagnosis present

## 2015-05-27 DIAGNOSIS — M25562 Pain in left knee: Secondary | ICD-10-CM | POA: Insufficient documentation

## 2015-05-27 DIAGNOSIS — M25561 Pain in right knee: Secondary | ICD-10-CM | POA: Insufficient documentation

## 2015-05-27 DIAGNOSIS — M7989 Other specified soft tissue disorders: Secondary | ICD-10-CM | POA: Insufficient documentation

## 2015-05-27 NOTE — Therapy (Signed)
[name] [place], [place], [address] Phone: [phone]   Fax:  [phone]  Physical Therapy Treatment  Patient Details  Name: [name] MRN: [id number] Date of Birth: [birth year] Referring Provider: Gaynelle Arabian  Encounter Date: 05/27/2015      PT End of Session - 05/27/15 1042    Visit Number 20   Date for PT Re-Evaluation 06/04/15   PT Start Time 0927   PT Stop Time 1042   PT Time Calculation (min) 75 min   Activity Tolerance Patient limited by pain   Behavior During Therapy [facility] for tasks assessed/performed      Past Medical History  Diagnosis Date  . MVP (mitral valve prolapse)   . MR (mitral regurgitation)     severe; mitral valve repair in march 2009  . Atrial flutter ([place])     s/p ablation  . Hyperlipidemia   . CAD (coronary artery disease)     cath 06/2007 40% LM  . CVD (cerebrovascular disease)     59% right ICA, 49% left ICA; h/oTIA; fu study 8/11 with normal carotids  . History of colonoscopy   . Osteoarthritis     both knees  . Dysrhythmia   . Heart murmur   . Shortness of breath dyspnea     increased exertion   . History of bronchitis   . Gait abnormality   . Horseshoe kidney     Past Surgical History  Procedure Laterality Date  . Tonsillectomy  1960  . Mitral valve repair  06/2007  . Heart ablation    . Cardiac catheterization    . Back surgery      secondary to ruptured disc  . Total knee arthroplasty Bilateral 03/09/2015    Procedure: TOTAL KNEE BILATERAL;  Surgeon: Gaynelle Arabian, MD;  Location: [facility];  Service: Orthopedics;  Laterality: Bilateral;    There were no vitals filed for this visit.  Visit Diagnosis:  Difficulty walking  Knee stiffness, left  Knee stiffness, right  Swelling of limb      Subjective Assessment - 05/27/15 0931    Subjective I am less stiff.  I am moving better   Currently in Pain? Yes   Pain Score 2    Pain Location  Knee   Pain Orientation Right   Pain Descriptors / Indicators Sore;Tightness   Pain Frequency Constant                         [facility] Adult PT Treatment/Exercise - 05/27/15 0001    Ambulation/Gait   Gait Comments stairs step over step, worked on him lifting 20# box from floor and carrying to simulate work.   High Level Balance   High Level Balance Activities Tandem walking;Marching forwards;Backward walking   Lumbar Exercises: Stretches   Passive Hamstring Stretch 4 reps;10 seconds   Lower Trunk Rotation 20 seconds;4 reps   Quad Stretch 20 seconds;4 reps   ITB Stretch 20 seconds;4 reps   Piriformis Stretch 2 reps;10 seconds   Knee/Hip Exercises: Aerobic   Stationary Bike 4 minutes, level 3   Elliptical R7 I17 min 6   Knee/Hip Exercises: Machines for Strengthening   Cybex Leg Press 50# 2x15   Other Machine Lat pulls 45#   Knee/Hip Exercises: Standing   Other Standing Knee Exercises sit to stand activities working on not using the arms   Vasopneumatic   Number Minutes Vasopneumatic  15 minutes  Vasopnuematic Location  Knee   Vasopneumatic Pressure High   Vasopneumatic Temperature  36                  PT Short Term Goals - 04/08/15 1107    PT SHORT TERM GOAL #1   Title independent with initial HEP   Status Achieved           PT Long Term Goals - 05/13/15 1111    PT LONG TERM GOAL #2   Title increase AROM of the knee to 5-120 degrees flexion   Status Partially Met   PT LONG TERM GOAL #3   Title be able to return to work   Status On-going               Plan - 05/27/15 1042    Clinical Impression Statement Much improved with stairs, able to go step over step without handrail   PT Next Visit Plan needs more functional strength   Consulted and Agree with Plan of Care Patient        Problem List Patient Active Problem List   Diagnosis Date Noted  . Postoperative anemia due to acute blood loss 03/19/2015  . Status post  bilateral knee replacements 03/14/2015  . OA (osteoarthritis) of knee 03/09/2015  . Pre-op examination 09/17/2014  . S/P mitral valve repair 03/18/2013  . Obesity (BMI 30-39.9) 02/24/2013  . HAND PAIN, RIGHT 06/05/2010  . CAD 12/28/2009  . CAROTID STENOSIS 12/28/2009  . CEREBROVASCULAR DISEASE 12/28/2009  . PRURITUS ANI 05/26/2009  . HYPERLIPIDEMIA, FAMILIAL 04/26/2009  . DEGENERATIVE JOINT DISEASE, KNEES, BILATERAL 09/27/2008  . Fatty liver disease, nonalcoholic 40/98/1191  . ABNORMAL TRANSAMINASE-LFT'S 12/29/2007  . COLONIC POLYPS, ADENOMATOUS, HX OF 12/25/2007  . 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 FEVER, HX OF 12/22/2007  . MITRAL REGURGITATION, SEVERE 03/19/2007  . HEMOCCULT POSITIVE STOOL 02/03/2007  . SYSTOLIC MURMUR 47/82/9562    [name], PT 05/27/2015, 10:47 AM  [facility] [address] [place], [place], [address] Phone: [phone]   Fax:  [phone]  Name: [name] MRN: [id number] Date of Birth: 01/22/1951

## 2015-05-31 ENCOUNTER — Ambulatory Visit: Payer: Managed Care, Other (non HMO) | Admitting: Physical Therapy

## 2015-05-31 ENCOUNTER — Encounter: Payer: Self-pay | Admitting: Physical Therapy

## 2015-05-31 DIAGNOSIS — M25662 Stiffness of left knee, not elsewhere classified: Secondary | ICD-10-CM

## 2015-05-31 DIAGNOSIS — M7989 Other specified soft tissue disorders: Secondary | ICD-10-CM

## 2015-05-31 DIAGNOSIS — M25661 Stiffness of right knee, not elsewhere classified: Secondary | ICD-10-CM

## 2015-05-31 DIAGNOSIS — M25561 Pain in right knee: Secondary | ICD-10-CM

## 2015-05-31 DIAGNOSIS — R262 Difficulty in walking, not elsewhere classified: Secondary | ICD-10-CM | POA: Diagnosis not present

## 2015-05-31 DIAGNOSIS — M25562 Pain in left knee: Secondary | ICD-10-CM

## 2015-05-31 NOTE — Therapy (Signed)
[address] [place], [place], [address] Phone: [phone]   Fax:  [phone]  Physical Therapy Treatment  Patient Details  Name: [name] MRN: [id number] Date of Birth: 1951-04-01 Referring Provider: Gaynelle Arabian  Encounter Date: 05/31/2015      PT End of Session - 05/31/15 1117    Visit Number 21   PT Start Time 1014   PT Stop Time 1139   PT Time Calculation (min) 85 min   Activity Tolerance Patient tolerated treatment well   Behavior During Therapy [facility] for tasks assessed/performed      Past Medical History  Diagnosis Date  . MVP (mitral valve prolapse)   . MR (mitral regurgitation)     severe; mitral valve repair in march 2009  . Atrial flutter ([place])     s/p ablation  . Hyperlipidemia   . CAD (coronary artery disease)     cath 06/2007 40% LM  . CVD (cerebrovascular disease)     59% right ICA, 49% left ICA; h/oTIA; fu study 8/11 with normal carotids  . History of colonoscopy   . Osteoarthritis     both knees  . Dysrhythmia   . Heart murmur   . Shortness of breath dyspnea     increased exertion   . History of bronchitis   . Gait abnormality   . Horseshoe kidney     Past Surgical History  Procedure Laterality Date  . Tonsillectomy  1960  . Mitral valve repair  06/2007  . Heart ablation    . Cardiac catheterization    . Back surgery      secondary to ruptured disc  . Total knee arthroplasty Bilateral 03/09/2015    Procedure: TOTAL KNEE BILATERAL;  Surgeon: Gaynelle Arabian, MD;  Location: [facility];  Service: Orthopedics;  Laterality: Bilateral;    There were no vitals filed for this visit.  Visit Diagnosis:  Difficulty walking  Knee stiffness, left  Knee stiffness, right  Swelling of limb  Arthralgia of both knees      Subjective Assessment - 05/31/15 1019    Subjective Not bad, moving well, back is less stiff.   Currently in Pain? Yes   Pain Score 1    Pain  Location Knee   Pain Orientation Right   Aggravating Factors  sitting   Pain Relieving Factors treatment, easy exercises                         [facility] Adult PT Treatment/Exercise - 05/31/15 0001    Ambulation/Gait   Gait Comments stairs step over step, worked on him lifting 20# box from floor and up and down stairs and carrying to simulate work.   Lumbar Exercises: Stretches   Passive Hamstring Stretch 3 reps;20 seconds   Lower Trunk Rotation 20 seconds;3 reps   Quad Stretch 20 seconds;3 reps   ITB Stretch --   Piriformis Stretch --   Knee/Hip Exercises: Aerobic   Stationary Bike 5 minutes, level 3-5   Elliptical R8 I18 min 6   Other Aerobic UBE constant work 40 watts 4 minutes   Knee/Hip Exercises: Machines for Strengthening   Cybex Knee Extension #20 2x10   Pt requested to move up to 20#   Cybex Knee Flexion 45# 2x15   Cybex Leg Press 60# 2x10   Other Machine lat pulls and seated rows 55#   Moist Heat Therapy   Number Minutes Moist Heat  15 Minutes   Moist Heat Location Lumbar Spine   Vasopneumatic   Number Minutes Vasopneumatic  15 minutes   Vasopnuematic Location  Knee   Vasopneumatic Pressure High   Vasopneumatic Temperature  36   Manual Therapy   Manual Therapy --   Other Manual Therapy --                  PT Short Term Goals - 04/08/15 1107    PT SHORT TERM GOAL #1   Title independent with initial HEP   Status Achieved           PT Long Term Goals - 05/13/15 1111    PT LONG TERM GOAL #2   Title increase AROM of the knee to 5-120 degrees flexion   Status Partially Met   PT LONG TERM GOAL #3   Title be able to return to work   Status On-going               Plan - 05/31/15 1120    Clinical Impression Statement Pt is doing well. Requested to go up in weight in several exercises in today's session.    Rehab Potential Good   PT Frequency 3x / week   PT Duration 4 weeks   PT Treatment/Interventions Electrical  Stimulation;Moist Heat;Ultrasound;Functional mobility training;Patient/family education;Therapeutic exercise;Therapeutic activities;Manual techniques;Vestibular   PT Next Visit Plan functional strengthening exercises   Consulted and Agree with Plan of Care Patient        Problem List Patient Active Problem List   Diagnosis Date Noted  . Postoperative anemia due to acute blood loss 03/19/2015  . Status post bilateral knee replacements 03/14/2015  . OA (osteoarthritis) of knee 03/09/2015  . Pre-op examination 09/17/2014  . S/P mitral valve repair 03/18/2013  . Obesity (BMI 30-39.9) 02/24/2013  . HAND PAIN, RIGHT 06/05/2010  . CAD 12/28/2009  . CAROTID STENOSIS 12/28/2009  . CEREBROVASCULAR DISEASE 12/28/2009  . PRURITUS ANI 05/26/2009  . HYPERLIPIDEMIA, FAMILIAL 04/26/2009  . DEGENERATIVE JOINT DISEASE, KNEES, BILATERAL 09/27/2008  . Fatty liver disease, nonalcoholic 33/53/3174  . ABNORMAL TRANSAMINASE-LFT'S 12/29/2007  . COLONIC POLYPS, ADENOMATOUS, HX OF 12/25/2007  . 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 FEVER, HX OF 12/22/2007  . MITRAL REGURGITATION, SEVERE 03/19/2007  . HEMOCCULT POSITIVE STOOL 02/03/2007  . SYSTOLIC MURMUR 09/92/7800    Barbette Merino, SPT 05/31/2015, 11:50 AM  [address] [place], [place], [address] Phone: [phone]   Fax:  [phone]  Name: SHAYA ALTAMURA MRN: [id number] Date of Birth: Apr 08, 1951

## 2015-06-02 ENCOUNTER — Ambulatory Visit: Payer: Managed Care, Other (non HMO) | Admitting: Physical Therapy

## 2015-06-02 ENCOUNTER — Encounter: Payer: Self-pay | Admitting: Physical Therapy

## 2015-06-02 DIAGNOSIS — M25661 Stiffness of right knee, not elsewhere classified: Secondary | ICD-10-CM

## 2015-06-02 DIAGNOSIS — M7989 Other specified soft tissue disorders: Secondary | ICD-10-CM

## 2015-06-02 DIAGNOSIS — R262 Difficulty in walking, not elsewhere classified: Secondary | ICD-10-CM | POA: Diagnosis not present

## 2015-06-02 DIAGNOSIS — M25562 Pain in left knee: Secondary | ICD-10-CM

## 2015-06-02 DIAGNOSIS — M25662 Stiffness of left knee, not elsewhere classified: Secondary | ICD-10-CM

## 2015-06-02 DIAGNOSIS — M25561 Pain in right knee: Secondary | ICD-10-CM

## 2015-06-02 NOTE — Therapy (Addendum)
[address] [place], [place], [address] Phone: [phone]   Fax:  [phone]  Physical Therapy Treatment  Patient Details  Name: [name] MRN: [id number] Date of Birth: 23-Mar-1951 Referring Provider: Gaynelle Arabian  Encounter Date: 06/02/2015      PT End of Session - 06/02/15 1119    Visit Number 22   PT Start Time 1012   PT Stop Time 1132   PT Time Calculation (min) 80 min   Activity Tolerance Patient tolerated treatment well   Behavior During Therapy [facility] for tasks assessed/performed      Past Medical History  Diagnosis Date  . MVP (mitral valve prolapse)   . MR (mitral regurgitation)     severe; mitral valve repair in march 2009  . Atrial flutter ([place])     s/p ablation  . Hyperlipidemia   . CAD (coronary artery disease)     cath 06/2007 40% LM  . CVD (cerebrovascular disease)     59% right ICA, 49% left ICA; h/oTIA; fu study 8/11 with normal carotids  . History of colonoscopy   . Osteoarthritis     both knees  . Dysrhythmia   . Heart murmur   . Shortness of breath dyspnea     increased exertion   . History of bronchitis   . Gait abnormality   . Horseshoe kidney     Past Surgical History  Procedure Laterality Date  . Tonsillectomy  1960  . Mitral valve repair  06/2007  . Heart ablation    . Cardiac catheterization    . Back surgery      secondary to ruptured disc  . Total knee arthroplasty Bilateral 03/09/2015    Procedure: TOTAL KNEE BILATERAL;  Surgeon: Gaynelle Arabian, MD;  Location: [facility];  Service: Orthopedics;  Laterality: Bilateral;    There were no vitals filed for this visit.  Visit Diagnosis:  Difficulty walking  Knee stiffness, left  Knee stiffness, right  Swelling of limb  Arthralgia of both knees      Subjective Assessment - 06/02/15 1012    Subjective States he has been doing okay since last visit. Knee is still stiff   Currently in Pain? Yes   Pain Score 2    Pain Location Knee   Pain Orientation Left  in the left knee more than the right knee   Pain Descriptors / Indicators Aching            [facility] PT Assessment - 06/02/15 0001    AROM   Right Knee Extension 3   Right Knee Flexion 130   Left Knee Extension 1   Left Knee Flexion 124                     [facility] Adult PT Treatment/Exercise - 06/02/15 0001    Ambulation/Gait   Gait Comments carrying 20# box lifting from floor to front door and back   Lumbar Exercises: Stretches   Passive Hamstring Stretch 3 reps;20 seconds   Lower Trunk Rotation 3 reps;20 seconds   Quad Stretch 20 seconds;3 reps   Knee/Hip Exercises: Aerobic   Stationary Bike 5 minutes, level 3-5   Elliptical R8 I18 min 6   Other Aerobic UBE constant work 40 watts 4 minutes   Knee/Hip Exercises: Machines for Strengthening   Cybex Knee Extension #20 2x15   Cybex Knee Flexion 45# 2x15   Cybex Leg Press 60# 2x10   Other  Machine lat pulls and seated rows 55#   Moist Heat Therapy   Number Minutes Moist Heat 15 Minutes   Moist Heat Location Lumbar Spine   Vasopneumatic   Number Minutes Vasopneumatic  15 minutes   Vasopnuematic Location  Knee   Vasopneumatic Pressure High   Vasopneumatic Temperature  36                  PT Short Term Goals - 04/08/15 1107    PT SHORT TERM GOAL #1   Title independent with initial HEP   Status Achieved           PT Long Term Goals - 05/13/15 1111    PT LONG TERM GOAL #2   Title increase AROM of the knee to 5-120 degrees flexion   Status Achieved   PT LONG TERM GOAL #3   Title be able to return to work   Status Partially met               Plan - 06/02/15 1120    Clinical Impression Statement Pt is doing well. Required minimal verbal cues for proper body mechanics with lifting 20# box. Excellent ROM bilaterally. Now able to ascend and descend stairs reciprocally. Has demonstrated he can lift and carry 30# without issues.     Pt will benefit from skilled therapeutic intervention in order to improve on the following deficits Abnormal gait;Decreased mobility;Decreased range of motion;Decreased strength;Difficulty walking;Impaired flexibility;Pain   Rehab Potential Good   PT Frequency 3x / week   PT Duration 4 weeks   PT Treatment/Interventions Electrical Stimulation;Moist Heat;Ultrasound;Functional mobility training;Patient/family education;Therapeutic exercise;Therapeutic activities;Manual techniques;Vestibular   PT Next Visit Plan Recommend discharge as he has met our goals. Only concern is his job, he may have to kneel or get up and down from the floor periodically.    Consulted and Agree with Plan of Care Patient        Problem List Patient Active Problem List   Diagnosis Date Noted  . Postoperative anemia due to acute blood loss 03/19/2015  . Status post bilateral knee replacements 03/14/2015  . OA (osteoarthritis) of knee 03/09/2015  . Pre-op examination 09/17/2014  . S/P mitral valve repair 03/18/2013  . Obesity (BMI 30-39.9) 02/24/2013  . HAND PAIN, RIGHT 06/05/2010  . CAD 12/28/2009  . CAROTID STENOSIS 12/28/2009  . CEREBROVASCULAR DISEASE 12/28/2009  . PRURITUS ANI 05/26/2009  . HYPERLIPIDEMIA, FAMILIAL 04/26/2009  . DEGENERATIVE JOINT DISEASE, KNEES, BILATERAL 09/27/2008  . Fatty liver disease, nonalcoholic 03/14/8526  . ABNORMAL TRANSAMINASE-LFT'S 12/29/2007  . COLONIC POLYPS, ADENOMATOUS, HX OF 12/25/2007  . 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 FEVER, HX OF 12/22/2007  . MITRAL REGURGITATION, SEVERE 03/19/2007  . HEMOCCULT POSITIVE STOOL 02/03/2007  . SYSTOLIC MURMUR 78/24/2353    Barbette Merino, SPT 06/02/2015, 11:23 AM  [address] [place], [place], [address] Phone: [phone]   Fax:  [phone]  Name: [name] MRN: [id number] Date of Birth: Mar 06, 1951

## 2015-06-15 ENCOUNTER — Other Ambulatory Visit: Payer: Self-pay | Admitting: Family Medicine

## 2015-09-29 ENCOUNTER — Telehealth: Payer: Self-pay | Admitting: *Deleted

## 2015-09-29 ENCOUNTER — Encounter: Payer: Self-pay | Admitting: *Deleted

## 2015-09-29 NOTE — Telephone Encounter (Signed)
Pre-Visit Call completed with patient and chart updated.   Pre-Visit Info documented in Specialty Comments under [employer].    

## 2015-09-30 ENCOUNTER — Encounter: Payer: Self-pay | Admitting: Family Medicine

## 2015-09-30 ENCOUNTER — Ambulatory Visit (INDEPENDENT_AMBULATORY_CARE_PROVIDER_SITE_OTHER): Payer: Managed Care, Other (non HMO) | Admitting: Family Medicine

## 2015-09-30 VITALS — BP 136/82 | HR 60 | Temp 97.7°F | Ht 69.0 in | Wt 227.0 lb

## 2015-09-30 DIAGNOSIS — Z0000 Encounter for general adult medical examination without abnormal findings: Secondary | ICD-10-CM | POA: Diagnosis not present

## 2015-09-30 DIAGNOSIS — I10 Essential (primary) hypertension: Secondary | ICD-10-CM

## 2015-09-30 DIAGNOSIS — M170 Bilateral primary osteoarthritis of knee: Secondary | ICD-10-CM

## 2015-09-30 DIAGNOSIS — E785 Hyperlipidemia, unspecified: Secondary | ICD-10-CM | POA: Diagnosis not present

## 2015-09-30 LAB — LIPID PANEL
CHOL/HDL RATIO: 5
Cholesterol: 224 mg/dL — ABNORMAL HIGH (ref 0–200)
HDL: 41.6 mg/dL (ref >39.00)
LDL Cholesterol: 159 mg/dL — ABNORMAL HIGH (ref 0–99)
NONHDL: 181.91
Triglycerides: 115 mg/dL (ref 0.0–149.0)
VLDL: 23 mg/dL (ref 0.0–40.0)

## 2015-09-30 LAB — TSH: TSH: 1.49 u[IU]/mL (ref 0.35–4.50)

## 2015-09-30 LAB — COMPREHENSIVE METABOLIC PANEL
ALBUMIN: 5 g/dL (ref 3.5–5.2)
ALT: 75 U/L — AB (ref 0–53)
AST: 39 U/L — AB (ref 0–37)
Alkaline Phosphatase: 64 U/L (ref 39–117)
BILIRUBIN TOTAL: 1 mg/dL (ref 0.2–1.2)
BUN: 18 mg/dL (ref 6–23)
CALCIUM: 9.8 mg/dL (ref 8.4–10.5)
CHLORIDE: 103 meq/L (ref 96–112)
CO2: 26 meq/L (ref 19–32)
CREATININE: 1.06 mg/dL (ref 0.40–1.50)
GFR: 75.13 mL/min (ref >60.00)
Glucose, Bld: 101 mg/dL — ABNORMAL HIGH (ref 70–99)
Potassium: 4.2 mEq/L (ref 3.5–5.1)
Sodium: 138 mEq/L (ref 135–145)
Total Protein: 7.3 g/dL (ref 6.0–8.3)

## 2015-09-30 LAB — POCT URINALYSIS DIPSTICK
BILIRUBIN UA: NEGATIVE
Glucose, UA: NEGATIVE
Ketones, UA: NEGATIVE
LEUKOCYTES UA: NEGATIVE
NITRITE UA: NEGATIVE
PH UA: 6
PROTEIN UA: NEGATIVE
RBC UA: NEGATIVE
Spec Grav, UA: 1.02
UROBILINOGEN UA: NEGATIVE

## 2015-09-30 LAB — CBC WITH DIFFERENTIAL/PLATELET
BASOS ABS: 0 10*3/uL (ref 0.0–0.1)
BASOS PCT: 0.8 % (ref 0.0–3.0)
Eosinophils Absolute: 0.1 10*3/uL (ref 0.0–0.7)
Eosinophils Relative: 2.9 % (ref 0.0–5.0)
HCT: 44.3 % (ref 39.0–52.0)
Hemoglobin: 15.3 g/dL (ref 13.0–17.0)
LYMPHS ABS: 1.8 10*3/uL (ref 0.7–4.0)
Lymphocytes Relative: 37.1 % (ref 12.0–46.0)
MCHC: 34.6 g/dL (ref 30.0–36.0)
MCV: 89.8 fl (ref 78.0–100.0)
MONOS PCT: 10.9 % (ref 3.0–12.0)
Monocytes Absolute: 0.5 10*3/uL (ref 0.1–1.0)
NEUTROS ABS: 2.3 10*3/uL (ref 1.4–7.7)
NEUTROS PCT: 48.3 % (ref 43.0–77.0)
PLATELETS: 223 10*3/uL (ref 150.0–400.0)
RBC: 4.93 Mil/uL (ref 4.22–5.81)
RDW: 13.4 % (ref 11.5–15.5)
WBC: 4.7 10*3/uL (ref 4.0–10.5)

## 2015-09-30 LAB — HEMOGLOBIN A1C: HEMOGLOBIN A1C: 5.8 % (ref 4.6–6.5)

## 2015-09-30 LAB — PSA: PSA: 1.35 ng/mL (ref 0.10–4.00)

## 2015-09-30 MED ORDER — OMEGA-3-ACID ETHYL ESTERS 1 G PO CAPS: 2.0000 | ORAL_CAPSULE | Freq: Two times a day (BID) | ORAL | Status: DC

## 2015-09-30 MED ORDER — MELOXICAM 15 MG PO TABS: 15.0000 mg | ORAL_TABLET | Freq: Every day | ORAL | Status: DC

## 2015-09-30 MED ORDER — FENOFIBRATE MICRONIZED 90 MG PO CAPS: 1.0000 | ORAL_CAPSULE | Freq: Every day | ORAL | Status: DC

## 2015-09-30 NOTE — Patient Instructions (Signed)

## 2015-09-30 NOTE — Progress Notes (Signed)
Pre visit review using our clinic review tool, if applicable. No additional management support is needed unless otherwise documented below in the visit note. 

## 2015-09-30 NOTE — Progress Notes (Signed)
Patient ID: [name], male    DOB: 01-May-1951  Age: 65 y.o. MRN: [id number]    Subjective:  Subjective HPI [name] presents for cpe and f/u cholesterol  Review of Systems  Constitutional: Negative.   HENT: Negative for congestion, ear pain, hearing loss, nosebleeds, postnasal drip, rhinorrhea, sinus pressure, sneezing and tinnitus.   Eyes: Negative for photophobia, discharge, itching and visual disturbance.  Respiratory: Negative.   Cardiovascular: Negative.   Gastrointestinal: Negative for abdominal pain, constipation, blood in stool, abdominal distention and anal bleeding.  Endocrine: Negative.   Genitourinary: Negative.   Musculoskeletal: Negative.   Skin: Negative.   Allergic/Immunologic: Negative.   Neurological: Negative for dizziness, weakness, light-headedness, numbness and headaches.  Psychiatric/Behavioral: Negative for suicidal ideas, confusion, sleep disturbance, dysphoric mood, decreased concentration and agitation. The patient is not nervous/anxious.     History Past Medical History  Diagnosis Date  . MVP (mitral valve prolapse)   . MR (mitral regurgitation)     severe; mitral valve repair in march 2009  . Atrial flutter ([place])     s/p ablation  . Hyperlipidemia   . CAD (coronary artery disease)     cath 06/2007 40% LM  . CVD (cerebrovascular disease)     59% right ICA, 49% left ICA; h/oTIA; fu study 8/11 with normal carotids  . History of colonoscopy   . Osteoarthritis     both knees  . Dysrhythmia   . Heart murmur   . Shortness of breath dyspnea     increased exertion   . History of bronchitis   . Gait abnormality   . Horseshoe kidney     He has past surgical history that includes Tonsillectomy (1960); Mitral valve repair (06/2007); heart ablation; Cardiac catheterization; Back surgery; and Total knee arthroplasty (Bilateral, 03/09/2015).   His family history includes Colitis in his daughter; Crohn's disease in his daughter; Hyperlipidemia  in his father; Liver cancer in his father; Liver disease in his father. There is no history of Colon cancer, Esophageal cancer, Rectal cancer, or Stomach cancer.He reports that he has never smoked. He has never used smokeless tobacco. He reports that he drinks alcohol. He reports that he does not use illicit drugs.  Current Outpatient Prescriptions on File Prior to Visit  Medication Sig Dispense Refill  . amoxicillin (AMOXIL) 500 MG capsule Take 4 capsules by mouth as needed. Prior to dental procedures.  4  . aspirin 81 MG tablet Take 81 mg by mouth daily.    Marland Kitchen omega-3 acid ethyl esters (LOVAZA) 1 g capsule TAKE 2 CAPSULES BY MOUTH TWICE A DAY 120 capsule 5   No current facility-administered medications on file prior to visit.     Objective:  Objective Physical Exam  Constitutional: He is oriented to person, place, and time. He appears well-developed and well-nourished. No distress.  HENT:  Head: Normocephalic and atraumatic.  Right Ear: External ear normal.  Left Ear: External ear normal.  Nose: Nose normal.  Mouth/Throat: Oropharynx is clear and moist. No oropharyngeal exudate.  Eyes: Conjunctivae and EOM are normal. Pupils are equal, round, and reactive to light. Right eye exhibits no discharge. Left eye exhibits no discharge.  Neck: Normal range of motion. Neck supple. No JVD present. No thyromegaly present.  Cardiovascular: Normal rate, regular rhythm and intact distal pulses.  Exam reveals no gallop and no friction rub.   No murmur heard. Pulmonary/Chest: Effort normal and breath sounds normal. No respiratory distress. He has no wheezes. He has no rales.  He exhibits no tenderness.  Abdominal: Soft. Bowel sounds are normal. He exhibits no distension and no mass. There is no tenderness. There is no rebound and no guarding.  Genitourinary: Rectum normal and penis normal. Guaiac negative stool. Prostate is enlarged.  Musculoskeletal: Normal range of motion. He exhibits no edema or  tenderness.  Lymphadenopathy:    He has no cervical adenopathy.  Neurological: He is alert and oriented to person, place, and time. He displays normal reflexes. He exhibits normal muscle tone.  Skin: Skin is warm and dry. No rash noted. He is not diaphoretic. No erythema. No pallor.  Psychiatric: He has a normal mood and affect. His behavior is normal. Judgment and thought content normal.   BP 136/82 mmHg  Pulse 60  Temp(Src) 97.7 F (36.5 C) (Oral)  Ht 5' 9"  (1.753 m)  Wt 227 lb (102.967 kg)  BMI 33.51 kg/m2  SpO2 95% Wt Readings from Last 3 Encounters:  09/30/15 227 lb (102.967 kg)  04/01/15 216 lb 3.2 oz (98.068 kg)  03/16/15 236 lb 3.2 oz (107.14 kg)     Lab Results  Component Value Date   WBC 5.8 05/03/2015   HGB 14.1 05/03/2015   HCT 43.2 05/03/2015   PLT 248.0 05/03/2015   GLUCOSE 106* 05/03/2015   CHOL 220* 02/23/2015   TRIG 107 02/23/2015   HDL 48 02/23/2015   LDLDIRECT 200.3 02/24/2013   LDLCALC 151* 02/23/2015   ALT 29 05/03/2015   AST 26 05/03/2015   NA 136 05/03/2015   K 4.1 05/03/2015   CL 102 05/03/2015   CREATININE 0.99 05/03/2015   BUN 15 05/03/2015   CO2 26 05/03/2015   TSH 1.439 09/17/2014   PSA 1.74 09/17/2014   INR 2.34* 03/23/2015   HGBA1C 5.9 08/24/2013    No results found.   Assessment & Plan:  Plan I have changed [name] to Fenofibrate Micronized. I have also changed his meloxicam. I am also having him maintain his amoxicillin, omega-3 acid ethyl esters, and aspirin.  Meds ordered this encounter  Medications  . Fenofibrate Micronized (ANTARA) 90 MG CAPS    Sig: Take 1 capsule by mouth daily.    Dispense:  90 capsule    Refill:  1  . meloxicam (MOBIC) 15 MG tablet    Sig: Take 1 tablet (15 mg total) by mouth daily.    Dispense:  90 tablet    Refill:  1    Problem List Items Addressed This Visit    None    Visit Diagnoses    Preventative health care    -  Primary    Relevant Orders    Comprehensive metabolic  panel    CBC with Differential/Platelet    Lipid panel    POCT urinalysis dipstick (Completed)    PSA    TSH    Hemoglobin A1c    Hyperlipidemia        Relevant Medications    Fenofibrate Micronized (ANTARA) 90 MG CAPS    Other Relevant Orders    Comprehensive metabolic panel    CBC with Differential/Platelet    Lipid panel    POCT urinalysis dipstick (Completed)    PSA    TSH    Hemoglobin A1c    Essential hypertension        Relevant Medications    Fenofibrate Micronized (ANTARA) 90 MG CAPS    Other Relevant Orders    Comprehensive metabolic panel    Lipid panel    POCT urinalysis  dipstick (Completed)    PSA    TSH    Hemoglobin A1c    Primary osteoarthritis of both knees        Relevant Medications    meloxicam (MOBIC) 15 MG tablet       Follow-up: Return in about 6 months (around 04/01/2016), or if symptoms worsen or fail to improve, for hypertension, hyperlipidemia.  Ann Held, DO

## 2016-01-16 ENCOUNTER — Other Ambulatory Visit (INDEPENDENT_AMBULATORY_CARE_PROVIDER_SITE_OTHER): Payer: Managed Care, Other (non HMO)

## 2016-01-16 DIAGNOSIS — E785 Hyperlipidemia, unspecified: Secondary | ICD-10-CM | POA: Diagnosis not present

## 2016-01-16 LAB — LIPID PANEL
Cholesterol: 228 mg/dL — ABNORMAL HIGH (ref 0–200)
HDL: 41.8 mg/dL (ref >39.00)
NonHDL: 186.15
Total CHOL/HDL Ratio: 5
Triglycerides: 232 mg/dL — ABNORMAL HIGH (ref 0.0–149.0)
VLDL: 46.4 mg/dL — AB (ref 0.0–40.0)

## 2016-01-16 LAB — COMPREHENSIVE METABOLIC PANEL
ALBUMIN: 4.6 g/dL (ref 3.5–5.2)
ALK PHOS: 54 U/L (ref 39–117)
ALT: 117 U/L — ABNORMAL HIGH (ref 0–53)
AST: 47 U/L — AB (ref 0–37)
BILIRUBIN TOTAL: 0.8 mg/dL (ref 0.2–1.2)
BUN: 21 mg/dL (ref 6–23)
CHLORIDE: 104 meq/L (ref 96–112)
CO2: 24 meq/L (ref 19–32)
CREATININE: 1.04 mg/dL (ref 0.40–1.50)
Calcium: 9.1 mg/dL (ref 8.4–10.5)
GFR: 76.73 mL/min (ref >60.00)
Glucose, Bld: 117 mg/dL — ABNORMAL HIGH (ref 70–99)
POTASSIUM: 4.1 meq/L (ref 3.5–5.1)
SODIUM: 137 meq/L (ref 135–145)
Total Protein: 7.1 g/dL (ref 6.0–8.3)

## 2016-01-16 LAB — LDL CHOLESTEROL, DIRECT: LDL DIRECT: 147 mg/dL

## 2016-01-19 ENCOUNTER — Telehealth: Payer: Self-pay | Admitting: Family Medicine

## 2016-01-19 NOTE — Telephone Encounter (Signed)
Pt returned nurse call for results.    CB: [phone]

## 2016-01-24 ENCOUNTER — Other Ambulatory Visit: Payer: Self-pay | Admitting: Family Medicine

## 2016-01-31 ENCOUNTER — Encounter: Payer: Self-pay | Admitting: Cardiology

## 2016-02-11 NOTE — Progress Notes (Signed)
HPI:FU mitral valve repair secondary to severe mitral regurgitation in February 2009. Note preoperative catheterization showed a 40% left main, 30% LAD and 30% right coronary artery. He has also had atrial flutter ablation in March 2009. Postoperatively, he had recurrent fevers that were felt secondary to a postsurgical inflammatory state. Carotid dopplers in August of 2011 were normal. Patient has had elevated LFTs with statins in the past. Echo 9/15 shows normal LV function, mean gradient across MV 5 mmHg. Nuclear study 10/16 showed EF 49 with normal perfusion. Since I last saw him, he notes some dyspnea on exertion but no orthopnea, PND, pedal edema, exertional chest pain or syncope.  Current Outpatient Prescriptions  Medication Sig Dispense Refill  . amoxicillin (AMOXIL) 500 MG capsule Take 4 capsules by mouth as needed. Prior to dental procedures.  4  . aspirin 81 MG tablet Take 81 mg by mouth daily.    . Fenofibrate Micronized (ANTARA) 90 MG CAPS Take 1 capsule by mouth daily. 90 capsule 1  . meloxicam (MOBIC) 15 MG tablet Take 1 tablet (15 mg total) by mouth daily. 90 tablet 1  . omega-3 acid ethyl esters (LOVAZA) 1 g capsule TAKE 2 CAPSULES BY MOUTH TWICE A DAY 120 capsule 5   No current facility-administered medications for this visit.      Past Medical History:  Diagnosis Date  . Atrial flutter ([place])    s/p ablation  . CAD (coronary artery disease)    cath 06/2007 40% LM  . CVD (cerebrovascular disease)    59% right ICA, 49% left ICA; h/oTIA; fu study 8/11 with normal carotids  . Dysrhythmia   . Gait abnormality   . Heart murmur   . History of bronchitis   . History of colonoscopy   . Horseshoe kidney   . Hyperlipidemia   . MR (mitral regurgitation)    severe; mitral valve repair in march 2009  . MVP (mitral valve prolapse)   . Osteoarthritis    both knees  . Shortness of breath dyspnea    increased exertion     Past Surgical History:  Procedure Laterality  Date  . BACK SURGERY     secondary to ruptured disc  . CARDIAC CATHETERIZATION    . heart ablation    . MITRAL VALVE REPAIR  06/2007  . TONSILLECTOMY  1960  . TOTAL KNEE ARTHROPLASTY Bilateral 03/09/2015   Procedure: TOTAL KNEE BILATERAL;  Surgeon: Gaynelle Arabian, MD;  Location: [facility];  Service: Orthopedics;  Laterality: Bilateral;    Social History   Social History  . Marital status: Married    Spouse name: N/A  . Number of children: N/A  . Years of education: N/A   Occupational History  . [employer] and [employer]     works from home   Social History Main Topics  . Smoking status: Never Smoker  . Smokeless tobacco: Never Used  . Alcohol use 0.0 oz/week     Comment: occasional 1-2 wine drinks a month  . Drug use: No  . Sexual activity: Yes    Partners: Female   Other Topics Concern  . Not on file   Social History Narrative   Exercise--  Total gym    Family History  Problem Relation Age of Onset  . Liver disease Father     cancer  . Hyperlipidemia Father   . Liver cancer Father   . Diabetes    . Colitis Daughter   . Crohn's disease Daughter   .  Colon cancer Neg Hx   . Esophageal cancer Neg Hx   . Rectal cancer Neg Hx   . Stomach cancer Neg Hx     ROS: no fevers or chills, productive cough, hemoptysis, dysphasia, odynophagia, melena, hematochezia, dysuria, hematuria, rash, seizure activity, orthopnea, PND, pedal edema, claudication. Remaining systems are negative.  Physical Exam: Well-developed well-nourished in no acute distress.  Skin is warm and dry.  HEENT is normal.  Neck is supple.  Chest is clear to auscultation with normal expansion.  Cardiovascular exam is regular rate and rhythm.  Abdominal exam nontender or distended. No masses palpated. Extremities show no edema. neuro grossly intact  ECG-Sinus rhythm with first-degree AV block, RV conduction delay.  A/P  1 Hyperlipidemia-he has not tolerated statins or zetia in the past. His most recent LDL was  147 in August 2017. Continue diet.  2 Status post mitral valve repair-continue SBE prophylaxis.  3 Coronary artery disease-continue aspirin. Intolerant to statins. Most recent nuclear study did not show ischemia. Moderate coronary disease on previous cardiac catheterization.   4 Dyspnea-he notes some increased dyspnea on exertion. He did have bilateral knee replacement last fall which required decreased activities. I think his dyspnea may be secondary to deconditioning and weight gain. I have asked him to increase his exercise and work on his diet and weight. If his symptoms continue despite the above we will plan to repeat echocardiogram.   Kirk Ruths, MD   Kirk Ruths, MD

## 2016-02-14 ENCOUNTER — Ambulatory Visit (INDEPENDENT_AMBULATORY_CARE_PROVIDER_SITE_OTHER): Payer: Managed Care, Other (non HMO) | Admitting: Cardiology

## 2016-02-14 ENCOUNTER — Encounter: Payer: Self-pay | Admitting: Cardiology

## 2016-02-14 VITALS — BP 142/94 | HR 68 | Ht 70.0 in | Wt 231.0 lb

## 2016-02-14 DIAGNOSIS — Z9889 Other specified postprocedural states: Secondary | ICD-10-CM

## 2016-02-14 DIAGNOSIS — R0600 Dyspnea, unspecified: Secondary | ICD-10-CM

## 2016-02-14 DIAGNOSIS — E7800 Pure hypercholesterolemia, unspecified: Secondary | ICD-10-CM | POA: Diagnosis not present

## 2016-02-14 NOTE — Patient Instructions (Signed)
Your physician wants you to follow-up in: ONE YEAR WITH DR CRENSHAW You will receive a reminder letter in the mail two months in advance. If you don't receive a letter, please call our office to schedule the follow-up appointment.   If you need a refill on your cardiac medications before your next appointment, please call your pharmacy.  

## 2016-03-25 ENCOUNTER — Other Ambulatory Visit: Payer: Self-pay | Admitting: Family Medicine

## 2016-03-25 DIAGNOSIS — M170 Bilateral primary osteoarthritis of knee: Secondary | ICD-10-CM

## 2016-04-03 ENCOUNTER — Ambulatory Visit (INDEPENDENT_AMBULATORY_CARE_PROVIDER_SITE_OTHER): Payer: Managed Care, Other (non HMO) | Admitting: Family Medicine

## 2016-04-03 ENCOUNTER — Encounter: Payer: Self-pay | Admitting: Family Medicine

## 2016-04-03 VITALS — BP 130/80 | HR 60 | Temp 97.9°F | Resp 16 | Ht 70.0 in | Wt 234.6 lb

## 2016-04-03 DIAGNOSIS — I2510 Atherosclerotic heart disease of native coronary artery without angina pectoris: Secondary | ICD-10-CM

## 2016-04-03 DIAGNOSIS — E785 Hyperlipidemia, unspecified: Secondary | ICD-10-CM | POA: Diagnosis not present

## 2016-04-03 DIAGNOSIS — Z9889 Other specified postprocedural states: Secondary | ICD-10-CM

## 2016-04-03 DIAGNOSIS — Z23 Encounter for immunization: Secondary | ICD-10-CM | POA: Diagnosis not present

## 2016-04-03 DIAGNOSIS — M170 Bilateral primary osteoarthritis of knee: Secondary | ICD-10-CM | POA: Diagnosis not present

## 2016-04-03 DIAGNOSIS — K760 Fatty (change of) liver, not elsewhere classified: Secondary | ICD-10-CM

## 2016-04-03 LAB — COMPREHENSIVE METABOLIC PANEL
ALT: 80 U/L — ABNORMAL HIGH (ref 0–53)
AST: 46 U/L — AB (ref 0–37)
Albumin: 4.4 g/dL (ref 3.5–5.2)
Alkaline Phosphatase: 50 U/L (ref 39–117)
BUN: 20 mg/dL (ref 6–23)
CHLORIDE: 105 meq/L (ref 96–112)
CO2: 25 meq/L (ref 19–32)
CREATININE: 1.03 mg/dL (ref 0.40–1.50)
Calcium: 9.3 mg/dL (ref 8.4–10.5)
GFR: 77.54 mL/min (ref >60.00)
Glucose, Bld: 102 mg/dL — ABNORMAL HIGH (ref 70–99)
Potassium: 4 mEq/L (ref 3.5–5.1)
SODIUM: 138 meq/L (ref 135–145)
Total Bilirubin: 0.9 mg/dL (ref 0.2–1.2)
Total Protein: 6.8 g/dL (ref 6.0–8.3)

## 2016-04-03 LAB — LIPID PANEL
CHOL/HDL RATIO: 5
Cholesterol: 219 mg/dL — ABNORMAL HIGH (ref 0–200)
HDL: 44.7 mg/dL (ref >39.00)
LDL CALC: 149 mg/dL — AB (ref 0–99)
NonHDL: 174.55
Triglycerides: 128 mg/dL (ref 0.0–149.0)
VLDL: 25.6 mg/dL (ref 0.0–40.0)

## 2016-04-03 MED ORDER — OMEGA-3-ACID ETHYL ESTERS 1 G PO CAPS: 2.0000 | ORAL_CAPSULE | Freq: Two times a day (BID) | ORAL | 1 refills | Status: DC

## 2016-04-03 MED ORDER — AMOXICILLIN 500 MG PO CAPS: 2000.0000 mg | ORAL_CAPSULE | ORAL | 4 refills | Status: DC | PRN

## 2016-04-03 MED ORDER — FENOFIBRATE MICRONIZED 90 MG PO CAPS: 1.0000 | ORAL_CAPSULE | Freq: Every day | ORAL | 1 refills | Status: DC

## 2016-04-03 MED ORDER — MELOXICAM 15 MG PO TABS: 15.0000 mg | ORAL_TABLET | Freq: Every day | ORAL | 1 refills | Status: DC

## 2016-04-03 NOTE — Assessment & Plan Note (Signed)
Recheck labs 

## 2016-04-03 NOTE — Progress Notes (Signed)
Pre visit review using our clinic review tool, if applicable. No additional management support is needed unless otherwise documented below in the visit note. 

## 2016-04-03 NOTE — Assessment & Plan Note (Signed)
Check labs con't cardiology f/u

## 2016-04-03 NOTE — Assessment & Plan Note (Signed)
Refill mobic

## 2016-04-03 NOTE — Progress Notes (Signed)
Patient ID: [name], male    DOB: 1951/05/07  Age: 65 y.o. MRN: [id number]    Subjective:  Subjective  HPI TANUSH DREES presents for f/u lipids and bp.  Review of Systems  Constitutional: Negative for appetite change, diaphoresis, fatigue and unexpected weight change.  Eyes: Negative for pain, redness and visual disturbance.  Respiratory: Negative for cough, chest tightness, shortness of breath and wheezing.   Cardiovascular: Negative for chest pain, palpitations and leg swelling.  Endocrine: Negative for cold intolerance, heat intolerance, polydipsia, polyphagia and polyuria.  Genitourinary: Negative for difficulty urinating, dysuria and frequency.  Neurological: Negative for dizziness, light-headedness, numbness and headaches.    History Past Medical History:  Diagnosis Date  . Atrial flutter ([place])    s/p ablation  . CAD (coronary artery disease)    cath 06/2007 40% LM  . CVD (cerebrovascular disease)    59% right ICA, 49% left ICA; h/oTIA; fu study 8/11 with normal carotids  . Dysrhythmia   . Gait abnormality   . Heart murmur   . History of bronchitis   . History of colonoscopy   . Horseshoe kidney   . Hyperlipidemia   . MR (mitral regurgitation)    severe; mitral valve repair in march 2009  . MVP (mitral valve prolapse)   . Osteoarthritis    both knees  . Shortness of breath dyspnea    increased exertion     He has a past surgical history that includes Tonsillectomy (1960); Mitral valve repair (06/2007); heart ablation; Cardiac catheterization; Back surgery; and Total knee arthroplasty (Bilateral, 03/09/2015).   His family history includes Colitis in his daughter; Crohn's disease in his daughter; Hyperlipidemia in his father; Liver cancer in his father; Liver disease in his father.He reports that he has never smoked. He has never used smokeless tobacco. He reports that he drinks alcohol. He reports that he does not use drugs.  Current Outpatient  Prescriptions on File Prior to Visit  Medication Sig Dispense Refill  . aspirin 81 MG tablet Take 81 mg by mouth daily.     No current facility-administered medications on file prior to visit.      Objective:  Objective  Physical Exam  Constitutional: He is oriented to person, place, and time. Vital signs are normal. He appears well-developed and well-nourished. He is sleeping.  HENT:  Head: Normocephalic and atraumatic.  Mouth/Throat: Oropharynx is clear and moist.  Eyes: EOM are normal. Pupils are equal, round, and reactive to light.  Neck: Normal range of motion. Neck supple. No thyromegaly present.  Cardiovascular: Normal rate and regular rhythm.   No murmur heard. Pulmonary/Chest: Effort normal and breath sounds normal. No respiratory distress. He has no wheezes. He has no rales. He exhibits no tenderness.  Musculoskeletal: He exhibits no edema or tenderness.  Neurological: He is alert and oriented to person, place, and time.  Skin: Skin is warm and dry.  Psychiatric: He has a normal mood and affect. His behavior is normal. Judgment and thought content normal.  Nursing note and vitals reviewed.  BP 130/80 (BP Location: Left Arm, Patient Position: Sitting, Cuff Size: Large)   Pulse 60   Temp 97.9 F (36.6 C) (Oral)   Resp 16   Ht 5' 10"  (1.778 m)   Wt 234 lb 9.6 oz (106.4 kg)   SpO2 95%   BMI 33.66 kg/m  Wt Readings from Last 3 Encounters:  04/03/16 234 lb 9.6 oz (106.4 kg)  02/14/16 231 lb (104.8 kg)  09/30/15  227 lb (103 kg)     Lab Results  Component Value Date   WBC 4.7 09/30/2015   HGB 15.3 09/30/2015   HCT 44.3 09/30/2015   PLT 223.0 09/30/2015   GLUCOSE 102 (H) 04/03/2016   CHOL 219 (H) 04/03/2016   TRIG 128.0 04/03/2016   HDL 44.70 04/03/2016   LDLDIRECT 147.0 01/16/2016   LDLCALC 149 (H) 04/03/2016   ALT 80 (H) 04/03/2016   AST 46 (H) 04/03/2016   NA 138 04/03/2016   K 4.0 04/03/2016   CL 105 04/03/2016   CREATININE 1.03 04/03/2016   BUN 20  04/03/2016   CO2 25 04/03/2016   TSH 1.49 09/30/2015   PSA 1.35 09/30/2015   INR 2.34 (H) 03/23/2015   HGBA1C 5.8 09/30/2015    No results found.   Assessment & Plan:  Plan  I have changed Mr. Hillery omega-3 acid ethyl esters and amoxicillin. I am also having him maintain his aspirin, meloxicam, and Fenofibrate Micronized.  Meds ordered this encounter  Medications  . omega-3 acid ethyl esters (LOVAZA) 1 g capsule    Sig: Take 2 capsules (2 g total) by mouth 2 (two) times daily.    Dispense:  360 capsule    Refill:  1  . meloxicam (MOBIC) 15 MG tablet    Sig: Take 1 tablet (15 mg total) by mouth daily.    Dispense:  90 tablet    Refill:  1  . Fenofibrate Micronized (ANTARA) 90 MG CAPS    Sig: Take 1 capsule by mouth daily.    Dispense:  90 capsule    Refill:  1  . amoxicillin (AMOXIL) 500 MG capsule    Sig: Take 4 capsules (2,000 mg total) by mouth as needed. Prior to dental procedures.    Dispense:  4 capsule    Refill:  4    Problem List Items Addressed This Visit      Unprioritized   Fatty liver disease, nonalcoholic    Recheck labs      OA (osteoarthritis) of knee    Refill mobic      Relevant Medications   meloxicam (MOBIC) 15 MG tablet   Atherosclerosis of native coronary artery of native heart without angina pectoris    con't meds and aspirin Per cardiology      Relevant Medications   omega-3 acid ethyl esters (LOVAZA) 1 g capsule   Fenofibrate Micronized (ANTARA) 90 MG CAPS   Other Relevant Orders   Comprehensive metabolic panel (Completed)   Lipid panel (Completed)   Hyperlipidemia   Relevant Medications   omega-3 acid ethyl esters (LOVAZA) 1 g capsule   Fenofibrate Micronized (ANTARA) 90 MG CAPS   Other Relevant Orders   Comprehensive metabolic panel (Completed)   Lipid panel (Completed)    Other Visit Diagnoses    History of mitral valve repair    -  Primary   Relevant Medications   amoxicillin (AMOXIL) 500 MG capsule   Encounter for  immunization       Relevant Orders   Flu Vaccine QUAD 36+ mos IM (Completed)      Follow-up: Return in about 6 months (around 10/01/2016) for annual exam, fasting.  Ann Held, DO

## 2016-04-03 NOTE — Patient Instructions (Signed)

## 2016-04-04 DIAGNOSIS — E785 Hyperlipidemia, unspecified: Secondary | ICD-10-CM | POA: Insufficient documentation

## 2016-04-04 DIAGNOSIS — I2510 Atherosclerotic heart disease of native coronary artery without angina pectoris: Secondary | ICD-10-CM | POA: Insufficient documentation

## 2016-04-04 NOTE — Assessment & Plan Note (Signed)
con't meds and aspirin Per cardiology

## 2016-10-01 ENCOUNTER — Ambulatory Visit (INDEPENDENT_AMBULATORY_CARE_PROVIDER_SITE_OTHER): Payer: 59 | Admitting: Family Medicine

## 2016-10-01 ENCOUNTER — Encounter: Payer: Self-pay | Admitting: Family Medicine

## 2016-10-01 VITALS — BP 179/85 | HR 68 | Temp 98.0°F | Ht 70.0 in | Wt 235.6 lb

## 2016-10-01 DIAGNOSIS — R0789 Other chest pain: Secondary | ICD-10-CM | POA: Diagnosis not present

## 2016-10-01 DIAGNOSIS — E785 Hyperlipidemia, unspecified: Secondary | ICD-10-CM | POA: Diagnosis not present

## 2016-10-01 DIAGNOSIS — I5023 Acute on chronic systolic (congestive) heart failure: Secondary | ICD-10-CM

## 2016-10-01 DIAGNOSIS — Z23 Encounter for immunization: Secondary | ICD-10-CM | POA: Diagnosis not present

## 2016-10-01 DIAGNOSIS — M170 Bilateral primary osteoarthritis of knee: Secondary | ICD-10-CM | POA: Diagnosis not present

## 2016-10-01 DIAGNOSIS — M6530 Trigger finger, unspecified finger: Secondary | ICD-10-CM | POA: Diagnosis not present

## 2016-10-01 DIAGNOSIS — Z0000 Encounter for general adult medical examination without abnormal findings: Secondary | ICD-10-CM

## 2016-10-01 LAB — HEMOGLOBIN A1C: Hgb A1c MFr Bld: 6.1 % (ref 4.6–6.5)

## 2016-10-01 LAB — CBC WITH DIFFERENTIAL/PLATELET
BASOS ABS: 0 10*3/uL (ref 0.0–0.1)
Basophils Relative: 0.9 % (ref 0.0–3.0)
EOS ABS: 0.1 10*3/uL (ref 0.0–0.7)
Eosinophils Relative: 2.2 % (ref 0.0–5.0)
HEMATOCRIT: 46.4 % (ref 39.0–52.0)
Hemoglobin: 15.8 g/dL (ref 13.0–17.0)
Lymphocytes Relative: 31.4 % (ref 12.0–46.0)
Lymphs Abs: 1.6 10*3/uL (ref 0.7–4.0)
MCHC: 34 g/dL (ref 30.0–36.0)
MCV: 92 fl (ref 78.0–100.0)
MONOS PCT: 8.5 % (ref 3.0–12.0)
Monocytes Absolute: 0.4 10*3/uL (ref 0.1–1.0)
NEUTROS ABS: 3 10*3/uL (ref 1.4–7.7)
Neutrophils Relative %: 57 % (ref 43.0–77.0)
PLATELETS: 211 10*3/uL (ref 150.0–400.0)
RBC: 5.04 Mil/uL (ref 4.22–5.81)
RDW: 12.7 % (ref 11.5–15.5)
WBC: 5.2 10*3/uL (ref 4.0–10.5)

## 2016-10-01 LAB — COMPREHENSIVE METABOLIC PANEL
ALT: 79 U/L — AB (ref 0–53)
AST: 39 U/L — AB (ref 0–37)
Albumin: 4.8 g/dL (ref 3.5–5.2)
Alkaline Phosphatase: 58 U/L (ref 39–117)
BILIRUBIN TOTAL: 0.7 mg/dL (ref 0.2–1.2)
BUN: 17 mg/dL (ref 6–23)
CALCIUM: 9.8 mg/dL (ref 8.4–10.5)
CHLORIDE: 106 meq/L (ref 96–112)
CO2: 25 meq/L (ref 19–32)
CREATININE: 1.09 mg/dL (ref 0.40–1.50)
GFR: 72.52 mL/min (ref >60.00)
GLUCOSE: 105 mg/dL — AB (ref 70–99)
Potassium: 4.4 mEq/L (ref 3.5–5.1)
SODIUM: 139 meq/L (ref 135–145)
Total Protein: 7.1 g/dL (ref 6.0–8.3)

## 2016-10-01 LAB — LIPID PANEL
CHOL/HDL RATIO: 5
Cholesterol: 208 mg/dL — ABNORMAL HIGH (ref 0–200)
HDL: 44.5 mg/dL (ref >39.00)
LDL CALC: 130 mg/dL — AB (ref 0–99)
NonHDL: 163.19
Triglycerides: 164 mg/dL — ABNORMAL HIGH (ref 0.0–149.0)
VLDL: 32.8 mg/dL (ref 0.0–40.0)

## 2016-10-01 LAB — TSH: TSH: 1.43 u[IU]/mL (ref 0.35–4.50)

## 2016-10-01 LAB — PSA: PSA: 1.19 ng/mL (ref 0.10–4.00)

## 2016-10-01 MED ORDER — MELOXICAM 15 MG PO TABS: 15.0000 mg | ORAL_TABLET | Freq: Every day | ORAL | 1 refills | Status: DC

## 2016-10-01 MED ORDER — FENOFIBRATE MICRONIZED 90 MG PO CAPS: 1.0000 | ORAL_CAPSULE | Freq: Every day | ORAL | 1 refills | Status: DC

## 2016-10-01 MED ORDER — LOVAZA 1 G PO CAPS: 2.0000 g | ORAL_CAPSULE | Freq: Two times a day (BID) | ORAL | 3 refills | Status: DC

## 2016-10-01 NOTE — Progress Notes (Signed)
Patient ID: [name], male   DOB: 01/27/1951, 66 y.o.   MRN: [id number]   Subjective:  I acted as a Education administrator for [employer], DO. Raiford Noble, [place]   Patient ID: [name], male    DOB: [birth year], 66 y.o.   MRN: [id number]  Chief Complaint  Patient presents with  . Hyperlipidemia  . Hand Pain  . Incisional Hernia  . Arm Pain    numbness / tingling in arm    Hyperlipidemia  This is a chronic problem. The problem is controlled. Pertinent negatives include no chest pain or shortness of breath.    Patient is in today for an annual examination. Patient states that he has been experiencing a tingly sensation that goes up and down his right arm for the past two months. Also states that his right thumb and index finger have been getting "stuck". Also wants to have a hernia just above his navel looked at. Also would like to have a 90-day supply of Lovaza due to insurance purposes and the fact that the generic form of this medication didn't work as well.  Patient Care Team: Carollee Herter, Alferd Apa, DO as PCP - General (Family Medicine) Earnie Larsson, MD as Consulting Physician (Neurosurgery) Stanford Breed Denice Bors, MD as Consulting Physician (Cardiology) Gaynelle Arabian, MD as Consulting Physician (Orthopedic Surgery)   Past Medical History:  Diagnosis Date  . Atrial flutter ([place])    s/p ablation  . CAD (coronary artery disease)    cath 06/2007 40% LM  . CVD (cerebrovascular disease)    59% right ICA, 49% left ICA; h/oTIA; fu study 8/11 with normal carotids  . Dysrhythmia   . Gait abnormality   . Heart murmur   . History of bronchitis   . History of colonoscopy   . Horseshoe kidney   . Hyperlipidemia   . MR (mitral regurgitation)    severe; mitral valve repair in march 2009  . MVP (mitral valve prolapse)   . Osteoarthritis    both knees  . Shortness of breath dyspnea    increased exertion     Past Surgical History:  Procedure Laterality Date  . BACK SURGERY     secondary  to ruptured disc  . CARDIAC CATHETERIZATION    . heart ablation    . MITRAL VALVE REPAIR  06/2007  . TONSILLECTOMY  1960  . TOTAL KNEE ARTHROPLASTY Bilateral 03/09/2015   Procedure: TOTAL KNEE BILATERAL;  Surgeon: Gaynelle Arabian, MD;  Location: [facility];  Service: Orthopedics;  Laterality: Bilateral;    Family History  Problem Relation Age of Onset  . Liver disease Father        cancer  . Hyperlipidemia Father   . Liver cancer Father   . Diabetes Unknown   . Colitis Daughter   . Crohn's disease Daughter   . Colon cancer Neg Hx   . Esophageal cancer Neg Hx   . Rectal cancer Neg Hx   . Stomach cancer Neg Hx     Social History   Social History  . Marital status: Married    Spouse name: N/A  . Number of children: N/A  . Years of education: N/A   Occupational History  . [employer] and [employer]     works from home   Social History Main Topics  . Smoking status: Never Smoker  . Smokeless tobacco: Never Used  . Alcohol use 0.0 oz/week     Comment: occasional 1-2 wine drinks a month  . Drug use:  No  . Sexual activity: Yes    Partners: Female   Other Topics Concern  . Not on file   Social History Narrative   Exercise--  Total gym    Outpatient Medications Prior to Visit  Medication Sig Dispense Refill  . aspirin 81 MG tablet Take 81 mg by mouth daily.    . Fenofibrate Micronized (ANTARA) 90 MG CAPS Take 1 capsule by mouth daily. 90 capsule 1  . meloxicam (MOBIC) 15 MG tablet Take 1 tablet (15 mg total) by mouth daily. 90 tablet 1  . omega-3 acid ethyl esters (LOVAZA) 1 g capsule Take 2 capsules (2 g total) by mouth 2 (two) times daily. 360 capsule 1  . amoxicillin (AMOXIL) 500 MG capsule Take 4 capsules (2,000 mg total) by mouth as needed. Prior to dental procedures. (Patient not taking: Reported on 10/01/2016) 4 capsule 4   No facility-administered medications prior to visit.     Allergies  Allergen Reactions  . Statins Other (See Comments)    Messes with liver function  .  Codeine     REACTION: nausea  . Tape Rash    Review of Systems  Constitutional: Negative for fever and malaise/fatigue.  HENT: Negative for congestion.   Eyes: Negative for blurred vision.  Respiratory: Negative for cough and shortness of breath.   Cardiovascular: Negative for chest pain, palpitations and leg swelling.  Gastrointestinal: Negative for vomiting.  Musculoskeletal: Negative for back pain.  Skin: Negative for rash.  Neurological: Negative for loss of consciousness and headaches.       Objective:    Physical Exam  Constitutional: He is oriented to person, place, and time. He appears well-developed and well-nourished. No distress.  HENT:  Head: Normocephalic and atraumatic.  Eyes: Conjunctivae are normal.  Neck: Normal range of motion. No thyromegaly present.  Cardiovascular: Normal rate and regular rhythm.   Pulmonary/Chest: Effort normal and breath sounds normal. He has no wheezes.  Abdominal: Soft. Bowel sounds are normal. There is no tenderness.    Musculoskeletal: He exhibits no edema or deformity.  Neurological: He is alert and oriented to person, place, and time.  Skin: Skin is warm and dry. He is not diaphoretic.  Psychiatric: He has a normal mood and affect.    BP (!) 179/85 (BP Location: Left Arm, Patient Position: Sitting, Cuff Size: Large)   Pulse 68   Temp 98 F (36.7 C) (Oral)   Ht 5' 10"  (1.778 m)   Wt 235 lb 9.6 oz (106.9 kg)   SpO2 99% Comment: RA  BMI 33.81 kg/m  Wt Readings from Last 3 Encounters:  10/01/16 235 lb 9.6 oz (106.9 kg)  04/03/16 234 lb 9.6 oz (106.4 kg)  02/14/16 231 lb (104.8 kg)   BP Readings from Last 3 Encounters:  10/01/16 (!) 179/85  04/03/16 130/80  02/14/16 (!) 142/94     Immunization History  Administered Date(s) Administered  . Influenza Split 02/26/2012, 02/24/2013  . Influenza Whole 03/22/2008, 04/29/2009, 04/19/2010  . Influenza,inj,Quad PF,36+ Mos 03/09/2014, 03/15/2015, 04/03/2016  . Pneumococcal  Polysaccharide-23 12/03/2007, 02/24/2013  . Td 07/28/2001  . Tdap 02/26/2012  . Zoster 09/17/2014  . Zoster Recombinat (Shingrix) 10/01/2016    Health Maintenance  Topic Date Due  . INFLUENZA VACCINE  12/19/2016  . COLONOSCOPY  03/31/2017  . TETANUS/TDAP  02/25/2022  . Hepatitis C Screening  Completed  . HIV Screening  Completed    Lab Results  Component Value Date   WBC 4.7 09/30/2015   HGB 15.3  09/30/2015   HCT 44.3 09/30/2015   PLT 223.0 09/30/2015   GLUCOSE 102 (H) 04/03/2016   CHOL 219 (H) 04/03/2016   TRIG 128.0 04/03/2016   HDL 44.70 04/03/2016   LDLDIRECT 147.0 01/16/2016   LDLCALC 149 (H) 04/03/2016   ALT 80 (H) 04/03/2016   AST 46 (H) 04/03/2016   NA 138 04/03/2016   K 4.0 04/03/2016   CL 105 04/03/2016   CREATININE 1.03 04/03/2016   BUN 20 04/03/2016   CO2 25 04/03/2016   TSH 1.49 09/30/2015   PSA 1.35 09/30/2015   INR 2.34 (H) 03/23/2015   HGBA1C 5.8 09/30/2015    Lab Results  Component Value Date   TSH 1.49 09/30/2015   Lab Results  Component Value Date   WBC 4.7 09/30/2015   HGB 15.3 09/30/2015   HCT 44.3 09/30/2015   MCV 89.8 09/30/2015   PLT 223.0 09/30/2015   Lab Results  Component Value Date   NA 138 04/03/2016   K 4.0 04/03/2016   CO2 25 04/03/2016   GLUCOSE 102 (H) 04/03/2016   BUN 20 04/03/2016   CREATININE 1.03 04/03/2016   BILITOT 0.9 04/03/2016   ALKPHOS 50 04/03/2016   AST 46 (H) 04/03/2016   ALT 80 (H) 04/03/2016   PROT 6.8 04/03/2016   ALBUMIN 4.4 04/03/2016   CALCIUM 9.3 04/03/2016   ANIONGAP 7 03/21/2015   GFR 77.54 04/03/2016   Lab Results  Component Value Date   CHOL 219 (H) 04/03/2016   Lab Results  Component Value Date   HDL 44.70 04/03/2016   Lab Results  Component Value Date   LDLCALC 149 (H) 04/03/2016   Lab Results  Component Value Date   TRIG 128.0 04/03/2016   Lab Results  Component Value Date   CHOLHDL 5 04/03/2016   Lab Results  Component Value Date   HGBA1C 5.8 09/30/2015          Assessment & Plan:   Problem List Items Addressed This Visit      Unprioritized   OA (osteoarthritis) of knee   Relevant Medications   meloxicam (MOBIC) 15 MG tablet   Hyperlipidemia   Relevant Medications   LOVAZA 1 g capsule   Fenofibrate Micronized (ANTARA) 90 MG CAPS    Other Visit Diagnoses    Trigger finger of right hand, unspecified finger    -  Primary   Relevant Orders   Ambulatory referral to Orthopedic Surgery   Preventative health care       Relevant Orders   PSA   Hemoglobin A1c   Comprehensive metabolic panel   Lipid panel   CBC with Differential/Platelet   TSH   Hyperlipidemia LDL goal <100       Relevant Medications   LOVAZA 1 g capsule   Fenofibrate Micronized (ANTARA) 90 MG CAPS   Other Relevant Orders   Comprehensive metabolic panel   Lipid panel   Atypical chest pain       Relevant Orders   EKG 12-Lead (Completed)   Acute on chronic systolic heart failure (HCC)       Relevant Medications   LOVAZA 1 g capsule   Fenofibrate Micronized (ANTARA) 90 MG CAPS   Need for shingles vaccine       Relevant Orders   Varicella-zoster vaccine IM (Shingrix) (Completed)      I have discontinued Mr. Wiebelhaus omega-3 acid ethyl esters and amoxicillin. I am also having him start on LOVAZA. Additionally, I am having him maintain his aspirin, meloxicam, and  Fenofibrate Micronized.  Meds ordered this encounter  Medications  . LOVAZA 1 g capsule    Sig: Take 2 capsules (2 g total) by mouth 2 (two) times daily.    Dispense:  360 capsule    Refill:  3    Brand medically necessary  . meloxicam (MOBIC) 15 MG tablet    Sig: Take 1 tablet (15 mg total) by mouth daily.    Dispense:  90 tablet    Refill:  1  . Fenofibrate Micronized (ANTARA) 90 MG CAPS    Sig: Take 1 capsule by mouth daily.    Dispense:  90 capsule    Refill:  1    CMA served as scribe during this visit. History, Physical and Plan performed by medical provider. Documentation and orders  reviewed and attested to.  Ann Held, DO

## 2016-10-01 NOTE — Progress Notes (Signed)
Pre visit review using our clinic review tool, if applicable. No additional management support is needed unless otherwise documented below in the visit note. 

## 2016-10-01 NOTE — Patient Instructions (Signed)
Trigger Finger Trigger finger (stenosing tenosynovitis) is a condition that causes a finger to get stuck in a bent position. Each finger has a tough, cord-like tissue that connects muscle to bone (tendon), and each tendon is surrounded by a tunnel of tissue (tendon sheath). To move your finger, your tendon needs to slide freely through the sheath. Trigger finger happens when the tendon or the sheath thickens, making it difficult to move your finger. Trigger finger can affect any finger or a thumb. It may affect more than one finger. Mild cases may clear up with rest and medicine. Severe cases require more treatment. What are the causes? Trigger finger is caused by a thickened finger tendon or tendon sheath. The cause of this thickening is not known. What increases the risk? The following factors may make you more likely to develop this condition:  Doing activities that require a strong grip.  Having rheumatoid arthritis, gout, or diabetes.  Being 40-60 years old.  Being a woman.  What are the signs or symptoms? Symptoms of this condition include:  Pain when bending or straightening your finger.  Tenderness or swelling where your finger attaches to the palm of your hand.  A lump in the palm of your hand or on the inside of your finger.  Hearing a popping sound when you try to straighten your finger.  Feeling a popping, catching, or locking sensation when you try to straighten your finger.  Being unable to straighten your finger.  How is this diagnosed? This condition is diagnosed based on your symptoms and a physical exam. How is this treated? This condition may be treated by:  Resting your finger and avoiding activities that make symptoms worse.  Wearing a finger splint to keep your finger in a slightly bent position.  Taking NSAIDs to relieve pain and swelling.  Injecting medicine (steroids) into the tendon sheath to reduce swelling and irritation. Injections may need to be  repeated.  Having surgery to open the tendon sheath. This may be done if other treatments do not work and you cannot straighten your finger. You may need physical therapy after surgery.  Follow these instructions at home:  Use moist heat to help reduce pain and swelling as told by your health care provider.  Rest your finger and avoid activities that make pain worse. Return to normal activities as told by your health care provider.  If you have a splint, wear it as told by your health care provider.  Take over-the-counter and prescription medicines only as told by your health care provider.  Keep all follow-up visits as told by your health care provider. This is important. Contact a health care provider if:  Your symptoms are not improving with home care. Summary  Trigger finger (stenosing tenosynovitis) causes your finger to get stuck in a bent position, and it can make it difficult and painful to straighten your finger.  This condition develops when a finger tendon or tendon sheath thickens.  Treatment starts with resting, wearing a splint, and taking NSAIDs.  In severe cases, surgery to open the tendon sheath may be needed. This information is not intended to replace advice given to you by your health care provider. Make sure you discuss any questions you have with your health care provider. Document Released: 02/25/2004 Document Revised: 04/17/2016 Document Reviewed: 04/17/2016 Elsevier Interactive Patient Education  2017 Elsevier Inc.  

## 2016-10-04 ENCOUNTER — Other Ambulatory Visit: Payer: Self-pay | Admitting: Family Medicine

## 2016-10-04 DIAGNOSIS — E785 Hyperlipidemia, unspecified: Secondary | ICD-10-CM

## 2017-01-08 ENCOUNTER — Encounter: Payer: Self-pay | Admitting: *Deleted

## 2017-01-18 ENCOUNTER — Ambulatory Visit (INDEPENDENT_AMBULATORY_CARE_PROVIDER_SITE_OTHER): Payer: 59 | Admitting: Family Medicine

## 2017-01-18 ENCOUNTER — Encounter: Payer: Self-pay | Admitting: Family Medicine

## 2017-01-18 VITALS — BP 140/90 | HR 64 | Temp 98.2°F | Ht 70.0 in | Wt 239.2 lb

## 2017-01-18 DIAGNOSIS — Z23 Encounter for immunization: Secondary | ICD-10-CM | POA: Diagnosis not present

## 2017-01-18 DIAGNOSIS — Z0000 Encounter for general adult medical examination without abnormal findings: Secondary | ICD-10-CM

## 2017-01-18 DIAGNOSIS — Z9889 Other specified postprocedural states: Secondary | ICD-10-CM | POA: Diagnosis not present

## 2017-01-18 DIAGNOSIS — E785 Hyperlipidemia, unspecified: Secondary | ICD-10-CM | POA: Diagnosis not present

## 2017-01-18 LAB — LIPID PANEL
Cholesterol: 232 mg/dL — ABNORMAL HIGH (ref 0–200)
HDL: 45.6 mg/dL (ref >39.00)
LDL CALC: 157 mg/dL — AB (ref 0–99)
NONHDL: 186.27
Total CHOL/HDL Ratio: 5
Triglycerides: 147 mg/dL (ref 0.0–149.0)
VLDL: 29.4 mg/dL (ref 0.0–40.0)

## 2017-01-18 LAB — COMPREHENSIVE METABOLIC PANEL
ALT: 117 U/L — ABNORMAL HIGH (ref 0–53)
AST: 61 U/L — AB (ref 0–37)
Albumin: 4.8 g/dL (ref 3.5–5.2)
Alkaline Phosphatase: 54 U/L (ref 39–117)
BUN: 23 mg/dL (ref 6–23)
CALCIUM: 9.9 mg/dL (ref 8.4–10.5)
CHLORIDE: 104 meq/L (ref 96–112)
CO2: 27 meq/L (ref 19–32)
Creatinine, Ser: 1.12 mg/dL (ref 0.40–1.50)
GFR: 70.21 mL/min (ref >60.00)
Glucose, Bld: 103 mg/dL — ABNORMAL HIGH (ref 70–99)
Potassium: 4.3 mEq/L (ref 3.5–5.1)
Sodium: 139 mEq/L (ref 135–145)
Total Bilirubin: 0.8 mg/dL (ref 0.2–1.2)
Total Protein: 7 g/dL (ref 6.0–8.3)

## 2017-01-18 LAB — CBC WITH DIFFERENTIAL/PLATELET
BASOS PCT: 0.8 % (ref 0.0–3.0)
Basophils Absolute: 0 10*3/uL (ref 0.0–0.1)
EOS PCT: 4.8 % (ref 0.0–5.0)
Eosinophils Absolute: 0.2 10*3/uL (ref 0.0–0.7)
HCT: 44.3 % (ref 39.0–52.0)
Hemoglobin: 15.2 g/dL (ref 13.0–17.0)
LYMPHS ABS: 1.8 10*3/uL (ref 0.7–4.0)
Lymphocytes Relative: 36.3 % (ref 12.0–46.0)
MCHC: 34.2 g/dL (ref 30.0–36.0)
MCV: 92.7 fl (ref 78.0–100.0)
MONO ABS: 0.5 10*3/uL (ref 0.1–1.0)
MONOS PCT: 9.8 % (ref 3.0–12.0)
NEUTROS PCT: 48.3 % (ref 43.0–77.0)
Neutro Abs: 2.4 10*3/uL (ref 1.4–7.7)
Platelets: 177 10*3/uL (ref 150.0–400.0)
RBC: 4.78 Mil/uL (ref 4.22–5.81)
RDW: 12.7 % (ref 11.5–15.5)
WBC: 5 10*3/uL (ref 4.0–10.5)

## 2017-01-18 LAB — POC URINALSYSI DIPSTICK (AUTOMATED)
Bilirubin, UA: NEGATIVE
Blood, UA: NEGATIVE
GLUCOSE UA: NEGATIVE
Ketones, UA: NEGATIVE
Leukocytes, UA: NEGATIVE
NITRITE UA: NEGATIVE
PROTEIN UA: NEGATIVE
Spec Grav, UA: 1.015 (ref 1.010–1.025)
UROBILINOGEN UA: 0.2 U/dL
pH, UA: 6 (ref 5.0–8.0)

## 2017-01-18 LAB — TSH: TSH: 1.44 u[IU]/mL (ref 0.35–4.50)

## 2017-01-18 LAB — PSA: PSA: 1.45 ng/mL (ref 0.10–4.00)

## 2017-01-18 NOTE — Assessment & Plan Note (Signed)
See AVS ghm utd Check labs

## 2017-01-18 NOTE — Patient Instructions (Signed)
Preventive Care 40-64 Years, Male Preventive care refers to lifestyle choices and visits with your health care provider that can promote health and wellness. What does preventive care include?  A yearly physical exam. This is also called an annual well check.  Dental exams once or twice a year.  Routine eye exams. Ask your health care provider how often you should have your eyes checked.  Personal lifestyle choices, including: ? Daily care of your teeth and gums. ? Regular physical activity. ? Eating a healthy diet. ? Avoiding tobacco and drug use. ? Limiting alcohol use. ? Practicing safe sex. ? Taking low-dose aspirin every day starting at age 39. What happens during an annual well check? The services and screenings done by your health care provider during your annual well check will depend on your age, overall health, lifestyle risk factors, and family history of disease. Counseling Your health care provider may ask you questions about your:  Alcohol use.  Tobacco use.  Drug use.  Emotional well-being.  Home and relationship well-being.  Sexual activity.  Eating habits.  Work and work Statistician.  Screening You may have the following tests or measurements:  Height, weight, and BMI.  Blood pressure.  Lipid and cholesterol levels. These may be checked every 5 years, or more frequently if you are over 64 years old.  Skin check.  Lung cancer screening. You may have this screening every year starting at age 64 if you have a 30-pack-year history of smoking and currently smoke or have quit within the past 15 years.  Fecal occult blood test (FOBT) of the stool. You may have this test every year starting at age 64.  Flexible sigmoidoscopy or colonoscopy. You may have a sigmoidoscopy every 5 years or a colonoscopy every 10 years starting at age 64.  Prostate cancer screening. Recommendations will vary depending on your family history and other risks.  Hepatitis C  blood test.  Hepatitis B blood test.  Sexually transmitted disease (STD) testing.  Diabetes screening. This is done by checking your blood sugar (glucose) after you have not eaten for a while (fasting). You may have this done every 1-3 years.  Discuss your test results, treatment options, and if necessary, the need for more tests with your health care provider. Vaccines Your health care provider may recommend certain vaccines, such as:  Influenza vaccine. This is recommended every year.  Tetanus, diphtheria, and acellular pertussis (Tdap, Td) vaccine. You may need a Td booster every 10 years.  Varicella vaccine. You may need this if you have not been vaccinated.  Zoster vaccine. You may need this after age 64.  Measles, mumps, and rubella (MMR) vaccine. You may need at least one dose of MMR if you were born in 1957 or later. You may also need a second dose.  Pneumococcal 13-valent conjugate (PCV13) vaccine. You may need this if you have certain conditions and have not been vaccinated.  Pneumococcal polysaccharide (PPSV23) vaccine. You may need one or two doses if you smoke cigarettes or if you have certain conditions.  Meningococcal vaccine. You may need this if you have certain conditions.  Hepatitis A vaccine. You may need this if you have certain conditions or if you travel or work in places where you may be exposed to hepatitis A.  Hepatitis B vaccine. You may need this if you have certain conditions or if you travel or work in places where you may be exposed to hepatitis B.  Haemophilus influenzae type b (Hib) vaccine.  You may need this if you have certain risk factors.  Talk to your health care provider about which screenings and vaccines you need and how often you need them. This information is not intended to replace advice given to you by your health care provider. Make sure you discuss any questions you have with your health care provider. Document Released: 06/03/2015  Document Revised: 01/25/2016 Document Reviewed: 03/08/2015 Elsevier Interactive Patient Education  2017 Elsevier Inc.  

## 2017-01-18 NOTE — Assessment & Plan Note (Signed)
con't sbe

## 2017-01-18 NOTE — Assessment & Plan Note (Signed)
Encouraged heart healthy diet, increase exercise, avoid trans fats, consider a krill oil cap daily con't fenofibrate and lovaza Check labs

## 2017-01-18 NOTE — Progress Notes (Signed)
Patient ID: [name], male    DOB: 09/11/1950  Age: 66 y.o. MRN: [id number]    Subjective:  Subjective  HPI HARLO FABELA presents for cpe   No complaints  Review of Systems  Constitutional: Negative.   HENT: Negative for congestion, ear pain, hearing loss, nosebleeds, postnasal drip, rhinorrhea, sinus pressure, sneezing and tinnitus.   Eyes: Negative for photophobia, discharge, itching and visual disturbance.  Respiratory: Negative.   Cardiovascular: Negative.   Gastrointestinal: Negative for abdominal distention, abdominal pain, anal bleeding, blood in stool and constipation.  Endocrine: Negative.   Genitourinary: Negative.   Musculoskeletal: Negative.   Skin: Negative.   Allergic/Immunologic: Negative.   Neurological: Negative for dizziness, weakness, light-headedness, numbness and headaches.  Psychiatric/Behavioral: Negative for agitation, confusion, decreased concentration, dysphoric mood, sleep disturbance and suicidal ideas. The patient is not nervous/anxious.     History Past Medical History:  Diagnosis Date  . Atrial flutter ([place])    s/p ablation  . CAD (coronary artery disease)    cath 06/2007 40% LM  . CVD (cerebrovascular disease)    59% right ICA, 49% left ICA; h/oTIA; fu study 8/11 with normal carotids  . Dysrhythmia   . Gait abnormality   . Heart murmur   . History of bronchitis   . History of colonoscopy   . Horseshoe kidney   . Hyperlipidemia   . MR (mitral regurgitation)    severe; mitral valve repair in march 2009  . MVP (mitral valve prolapse)   . Osteoarthritis    both knees  . Shortness of breath dyspnea    increased exertion     He has a past surgical history that includes Tonsillectomy (1960); Mitral valve repair (06/2007); heart ablation; Cardiac catheterization; Back surgery; and Total knee arthroplasty (Bilateral, 03/09/2015).   His family history includes Colitis in his daughter; Crohn's disease in his daughter; Diabetes in his  unknown relative; Hyperlipidemia in his father; Liver cancer in his father; Liver disease in his father.He reports that he has never smoked. He has never used smokeless tobacco. He reports that he drinks alcohol. He reports that he does not use drugs.  Current Outpatient Prescriptions on File Prior to Visit  Medication Sig Dispense Refill  . aspirin 81 MG tablet Take 81 mg by mouth daily.    . Fenofibrate Micronized (ANTARA) 90 MG CAPS Take 1 capsule by mouth daily. 90 capsule 1  . LOVAZA 1 g capsule Take 2 capsules (2 g total) by mouth 2 (two) times daily. 360 capsule 3  . meloxicam (MOBIC) 15 MG tablet Take 1 tablet (15 mg total) by mouth daily. 90 tablet 1   No current facility-administered medications on file prior to visit.      Objective:  Objective  Physical Exam  Constitutional: He is oriented to person, place, and time. Vital signs are normal. He appears well-developed and well-nourished. He is sleeping. No distress.  HENT:  Head: Normocephalic and atraumatic.  Right Ear: External ear normal.  Left Ear: External ear normal.  Nose: Nose normal.  Mouth/Throat: Oropharynx is clear and moist. No oropharyngeal exudate.  Eyes: Pupils are equal, round, and reactive to light. Conjunctivae and EOM are normal. Right eye exhibits no discharge. Left eye exhibits no discharge.  Neck: Normal range of motion. Neck supple. No JVD present. No thyromegaly present.  Cardiovascular: Normal rate, regular rhythm and intact distal pulses.  Exam reveals no gallop and no friction rub.   No murmur heard. Pulmonary/Chest: Effort normal and breath sounds  normal. No respiratory distress. He has no wheezes. He has no rales. He exhibits no tenderness.  Abdominal: Soft. Bowel sounds are normal. He exhibits no distension and no mass. There is no tenderness. There is no rebound and no guarding.  Genitourinary: Rectum normal, prostate normal and penis normal. Rectal exam shows guaiac negative stool.    Musculoskeletal: Normal range of motion. He exhibits no edema or tenderness.  Lymphadenopathy:    He has no cervical adenopathy.  Neurological: He is alert and oriented to person, place, and time. He displays normal reflexes. He exhibits normal muscle tone.  Skin: Skin is warm and dry. No rash noted. He is not diaphoretic. No erythema. No pallor.  Psychiatric: He has a normal mood and affect. His behavior is normal. Judgment and thought content normal.  Nursing note and vitals reviewed.  BP 140/90 (BP Location: Right Arm, Patient Position: Sitting, Cuff Size: Normal)   Pulse 64   Temp 98.2 F (36.8 C) (Oral)   Ht 5' 10"  (1.778 m)   Wt 239 lb 3.2 oz (108.5 kg)   SpO2 97%   BMI 34.32 kg/m  Wt Readings from Last 3 Encounters:  01/18/17 239 lb 3.2 oz (108.5 kg)  10/01/16 235 lb 9.6 oz (106.9 kg)  04/03/16 234 lb 9.6 oz (106.4 kg)     Lab Results  Component Value Date   WBC 5.2 10/01/2016   HGB 15.8 10/01/2016   HCT 46.4 10/01/2016   PLT 211.0 10/01/2016   GLUCOSE 105 (H) 10/01/2016   CHOL 208 (H) 10/01/2016   TRIG 164.0 (H) 10/01/2016   HDL 44.50 10/01/2016   LDLDIRECT 147.0 01/16/2016   LDLCALC 130 (H) 10/01/2016   ALT 79 (H) 10/01/2016   AST 39 (H) 10/01/2016   NA 139 10/01/2016   K 4.4 10/01/2016   CL 106 10/01/2016   CREATININE 1.09 10/01/2016   BUN 17 10/01/2016   CO2 25 10/01/2016   TSH 1.43 10/01/2016   PSA 1.19 10/01/2016   INR 2.34 (H) 03/23/2015   HGBA1C 6.1 10/01/2016    No results found.   Assessment & Plan:  Plan  I am having Mr. Bursch maintain his aspirin, LOVAZA, meloxicam, and Fenofibrate Micronized.  No orders of the defined types were placed in this encounter.   Problem List Items Addressed This Visit      Unprioritized   Hyperlipidemia LDL goal <100    Encouraged heart healthy diet, increase exercise, avoid trans fats, consider a krill oil cap daily con't fenofibrate and lovaza Check labs      Relevant Orders   CBC with  Differential/Platelet   Lipid panel   TSH   PSA   Comprehensive metabolic panel   POCT Urinalysis Dipstick (Automated)   Need for shingles vaccine   Relevant Orders   Varicella-zoster vaccine IM (Shingrix) (Completed)   Preventative health care - Primary    See AVS ghm utd Check labs      Relevant Orders   CBC with Differential/Platelet   Lipid panel   TSH   PSA   Comprehensive metabolic panel   POCT Urinalysis Dipstick (Automated)   S/P mitral valve repair    con't sbe         Follow-up: Return in about 6 months (around 07/18/2017).  Ann Held, DO

## 2017-01-31 NOTE — Progress Notes (Signed)
HPI: FU mitral valve repair secondary to severe mitral regurgitation in February 2009. Note preoperative catheterization showed a 40% left main, 30% LAD and 30% right coronary artery. He has also had atrial flutter ablation in March 2009. Postoperatively, he had recurrent fevers that were felt secondary to a postsurgical inflammatory state. Carotid dopplers in August of 2011 were normal. Patient has had elevated LFTs with statins in the past. Echo 9/15 shows normal LV function, mean gradient across MV 5 mmHg. Nuclear study 10/16 showed EF 49 with normal perfusion. Since I last saw him, patient notes some dyspnea on exertion but no orthopnea, PND, pedal edema, chest pain or syncope.  Current Outpatient Prescriptions  Medication Sig Dispense Refill  . amoxicillin (AMOXIL) 500 MG capsule Take 500 mg by mouth as directed. Before dental work    . aspirin 81 MG tablet Take 81 mg by mouth daily.    . Fenofibrate Micronized (ANTARA) 90 MG CAPS Take 1 capsule by mouth daily. 90 capsule 1  . LOVAZA 1 g capsule Take 2 capsules (2 g total) by mouth 2 (two) times daily. 360 capsule 3  . meloxicam (MOBIC) 15 MG tablet Take 1 tablet (15 mg total) by mouth daily. 90 tablet 1   No current facility-administered medications for this visit.      Past Medical History:  Diagnosis Date  . Atrial flutter ([place])    s/p ablation  . CAD (coronary artery disease)    cath 06/2007 40% LM  . CVD (cerebrovascular disease)    59% right ICA, 49% left ICA; h/oTIA; fu study 8/11 with normal carotids  . Dysrhythmia   . Gait abnormality   . Heart murmur   . History of bronchitis   . History of colonoscopy   . Horseshoe kidney   . Hyperlipidemia   . MR (mitral regurgitation)    severe; mitral valve repair in march 2009  . MVP (mitral valve prolapse)   . Osteoarthritis    both knees  . Shortness of breath dyspnea    increased exertion     Past Surgical History:  Procedure Laterality Date  . BACK SURGERY     secondary to ruptured disc  . CARDIAC CATHETERIZATION    . heart ablation    . MITRAL VALVE REPAIR  06/2007  . TONSILLECTOMY  1960  . TOTAL KNEE ARTHROPLASTY Bilateral 03/09/2015   Procedure: TOTAL KNEE BILATERAL;  Surgeon: Gaynelle Arabian, MD;  Location: [facility];  Service: Orthopedics;  Laterality: Bilateral;    Social History   Social History  . Marital status: Married    Spouse name: N/A  . Number of children: N/A  . Years of education: N/A   Occupational History  . [employer] and [employer]     works from home   Social History Main Topics  . Smoking status: Never Smoker  . Smokeless tobacco: Never Used  . Alcohol use 0.0 oz/week     Comment: occasional 1-2 wine drinks a month  . Drug use: No  . Sexual activity: Yes    Partners: Female   Other Topics Concern  . Not on file   Social History Narrative   Exercise--  Total gym    Family History  Problem Relation Age of Onset  . Liver disease Father        cancer  . Hyperlipidemia Father   . Liver cancer Father   . Diabetes Unknown   . Colitis Daughter   . Crohn's disease Daughter   .  Colon cancer Neg Hx   . Esophageal cancer Neg Hx   . Rectal cancer Neg Hx   . Stomach cancer Neg Hx     ROS: no fevers or chills, productive cough, hemoptysis, dysphasia, odynophagia, melena, hematochezia, dysuria, hematuria, rash, seizure activity, orthopnea, PND, pedal edema, claudication. Remaining systems are negative.  Physical Exam: Well-developed obese in no acute distress.  Skin is warm and dry.  HEENT is normal.  Neck is supple.  Chest is clear to auscultation with normal expansion.  Cardiovascular exam is regular rate and rhythm.  Abdominal exam nontender or distended. No masses palpated. Extremities show no edema. neuro grossly intact   A/P  1 Status post mitral valve repair-Continue SBE prophylaxis. Patient describes some dyspnea. Repeat echocardiogram to assess mitral valve repair and LV function.   2 Coronary artery  disease-continue statin. Patient is intolerant to statins.   3 hyperlipidemia-patient has not tolerated statins in the past. He has also not tolerated Zetia. I will refer to lipid clinic for consideration of repatha.   4 Obesity-we discussed importance of weight loss, diet and exercise.  5 Dyspnea-possibly secondary to weight gain and deconditioning. Echocardiogram to reassess mitral valve repair.  Kirk Ruths, MD

## 2017-02-01 ENCOUNTER — Encounter: Payer: Self-pay | Admitting: Cardiology

## 2017-02-13 ENCOUNTER — Ambulatory Visit (INDEPENDENT_AMBULATORY_CARE_PROVIDER_SITE_OTHER): Payer: 59 | Admitting: Cardiology

## 2017-02-13 ENCOUNTER — Encounter: Payer: Self-pay | Admitting: Cardiology

## 2017-02-13 VITALS — BP 153/96 | HR 66 | Ht 70.0 in | Wt 237.4 lb

## 2017-02-13 DIAGNOSIS — E7800 Pure hypercholesterolemia, unspecified: Secondary | ICD-10-CM

## 2017-02-13 DIAGNOSIS — Z9889 Other specified postprocedural states: Secondary | ICD-10-CM | POA: Diagnosis not present

## 2017-02-13 DIAGNOSIS — I2510 Atherosclerotic heart disease of native coronary artery without angina pectoris: Secondary | ICD-10-CM | POA: Diagnosis not present

## 2017-02-13 DIAGNOSIS — R0602 Shortness of breath: Secondary | ICD-10-CM

## 2017-02-13 NOTE — Patient Instructions (Signed)
Medication Instructions:   NO CHANGE  Testing/Procedures:  Your physician has requested that you have an echocardiogram. Echocardiography is a painless test that uses sound waves to create images of your heart. It provides your doctor with information about the size and shape of your heart and how well your heart's chambers and valves are working. This procedure takes approximately one hour. There are no restrictions for this procedure.    Follow-Up:  Your physician wants you to follow-up in: [place] will receive a reminder letter in the mail two months in advance. If you don't receive a letter, please call our office to schedule the follow-up appointment.   If you need a refill on your cardiac medications before your next appointment, please call your pharmacy.

## 2017-02-19 ENCOUNTER — Ambulatory Visit (HOSPITAL_BASED_OUTPATIENT_CLINIC_OR_DEPARTMENT_OTHER)
Admission: RE | Admit: 2017-02-19 | Discharge: 2017-02-19 | Disposition: A | Discharge status: Home / Self Care | Payer: 59 | Source: Ambulatory Visit | Attending: Cardiology | Admitting: Cardiology

## 2017-02-19 DIAGNOSIS — I2510 Atherosclerotic heart disease of native coronary artery without angina pectoris: Secondary | ICD-10-CM | POA: Insufficient documentation

## 2017-02-19 DIAGNOSIS — E785 Hyperlipidemia, unspecified: Secondary | ICD-10-CM | POA: Insufficient documentation

## 2017-02-19 DIAGNOSIS — I080 Rheumatic disorders of both mitral and aortic valves: Secondary | ICD-10-CM | POA: Diagnosis not present

## 2017-02-19 DIAGNOSIS — R0602 Shortness of breath: Secondary | ICD-10-CM | POA: Diagnosis not present

## 2017-02-19 DIAGNOSIS — I341 Nonrheumatic mitral (valve) prolapse: Secondary | ICD-10-CM | POA: Diagnosis not present

## 2017-02-19 LAB — ECHOCARDIOGRAM COMPLETE
CHL CUP MV DEC (S): 267
EERAT: 18.38
EWDT: 267 ms
FS: 37 % (ref 28–44)
IV/PV OW: 1.49
LA ID, A-P, ES: 49 mm
LA vol index: 40 mL/m2
LA vol: 90 mL
LADIAMINDEX: 2.18 cm/m2
LAVOLA4C: 88.8 mL
LEFT ATRIUM END SYS DIAM: 49 mm
LV E/e'average: 18.38
LV PW d: 10.1 mm — AB (ref 0.6–1.1)
LV TDI E'LATERAL: 7.18
LV TDI E'MEDIAL: 5.66
LV e' LATERAL: 7.18 cm/s
LVEEMED: 18.38
LVOT MV VTI INDEX: 0.8 cm2/m2
LVOT MV VTI: 1.8
LVOT VTI: 20.4 cm
LVOT area: 3.8 cm2
LVOT diameter: 22 mm
LVOT peak vel: 99.8 cm/s
LVOTSV: 78 mL
MV M vel: 81
MV Peak grad: 7 mmHg
MV pk A vel: 129 m/s
MVANNULUSVTI: 43.1 cm
MVAP: 1.85 cm2
MVPKEVEL: 132 m/s
MVSPHT: 119 ms
Mean grad: 3 mmHg
RV LATERAL S' VELOCITY: 7.29 cm/s
RV TAPSE: 19.3 mm

## 2017-02-19 NOTE — Progress Notes (Signed)
  Echocardiogram 2D Echocardiogram has been performed.  Kaveh Kissinger T Harun Brumley 02/19/2017, 9:22 AM

## 2017-02-19 NOTE — Progress Notes (Signed)
  Echocardiogram 2D Echocardiogram has been performed.  Xaviar Lunn T Quintin Hjort 02/19/2017, 9:16 AM

## 2017-02-21 ENCOUNTER — Ambulatory Visit (INDEPENDENT_AMBULATORY_CARE_PROVIDER_SITE_OTHER): Payer: 59 | Admitting: Pharmacist

## 2017-02-21 DIAGNOSIS — E7800 Pure hypercholesterolemia, unspecified: Secondary | ICD-10-CM | POA: Diagnosis not present

## 2017-02-21 DIAGNOSIS — E785 Hyperlipidemia, unspecified: Secondary | ICD-10-CM | POA: Diagnosis not present

## 2017-02-21 MED ORDER — ICOSAPENT ETHYL 0.5 G PO CAPS: 2.0000 g | ORAL_CAPSULE | Freq: Two times a day (BID) | ORAL | 5 refills | Status: DC

## 2017-02-21 MED ORDER — ICOSAPENT ETHYL 1 G PO CAPS: 2.0000 g | ORAL_CAPSULE | Freq: Two times a day (BID) | ORAL | 5 refills | Status: DC

## 2017-02-21 NOTE — Patient Instructions (Addendum)
[facility] (pharmacist) [name] [phone]    Cholesterol Cholesterol is a fat. Your body needs a small amount of cholesterol. Cholesterol (plaque) may build up in your blood vessels (arteries). That makes you more likely to have a heart attack or stroke. You cannot feel your cholesterol level. Having a blood test is the only way to find out if your level is high. Keep your test results. Work with your doctor to keep your cholesterol at a good level. What do the results mean?  Total cholesterol is how much cholesterol is in your blood.  LDL is bad cholesterol. This is the type that can build up. Try to have low LDL.  HDL is good cholesterol. It cleans your blood vessels and carries LDL away. Try to have high HDL.  Triglycerides are fat that the body can store or burn for energy. What are good levels of cholesterol?  Total cholesterol below 200.  LDL below 100 is good for people who have health risks. LDL below 70 is good for people who have very high risks.  HDL above 40 is good. It is best to have HDL of 60 or higher.  Triglycerides below 150. How can I lower my cholesterol? Diet Follow your diet program as told by your doctor.  Choose fish, white meat chicken, or [name] that is roasted or baked. Try not to eat red meat, fried foods, sausage, or lunch meats.  Eat lots of fresh fruits and vegetables.  Choose whole grains, beans, pasta, potatoes, and cereals.  Choose olive oil, corn oil, or canola oil. Only use small amounts.  Try not to eat butter, mayonnaise, shortening, or palm kernel oils.  Try not to eat foods with trans fats.  Choose low-fat or nonfat dairy foods. ? Drink skim or nonfat milk. ? Eat low-fat or nonfat yogurt and cheeses. ? Try not to drink whole milk or cream. ? Try not to eat ice cream, egg yolks, or full-fat cheeses.  Healthy desserts include angel food cake, ginger snaps, animal crackers, hard candy, popsicles, and low-fat or nonfat  frozen yogurt. Try not to eat pastries, cakes, pies, and cookies.  Exercise Follow your exercise program as told by your doctor.  Be more active. Try gardening, walking, and taking the stairs.  Ask your doctor about ways that you can be more active.  Medicine  Take over-the-counter and prescription medicines only as told by your doctor.  This information is not intended to replace advice given to you by your health care provider. Make sure you discuss any questions you have with your health care provider. Document Released: 08/03/2008 Document Revised: 12/07/2015 Document Reviewed: 11/17/2015 Elsevier Interactive Patient Education  2017 Elsevier Inc.    Fatty Liver Fatty liver, also called hepatic steatosis or steatohepatitis, is a condition in which too much fat has built up in your liver cells. The liver removes harmful substances from your bloodstream. It produces fluids your body needs. It also helps your body use and store energy from the food you eat. In many cases, fatty liver does not cause symptoms or problems. It is often diagnosed when tests are being done for other reasons. However, over time, fatty liver can cause inflammation that may lead to more serious liver problems, such as scarring of the liver (cirrhosis). What are the causes? Causes of fatty liver may include:  Drinking too much alcohol.  Poor nutrition.  Obesity.  Cushing syndrome.  Diabetes.  Hyperlipidemia.  Pregnancy.  Certain drugs.  Poisons.  Some viral  infections.  What increases the risk? You may be more likely to develop fatty liver if you:  Abuse alcohol.  Are pregnant.  Are overweight.  Have diabetes.  Have hepatitis.  Have a high triglyceride level.  What are the signs or symptoms? Fatty liver often does not cause any symptoms. In cases where symptoms develop, they can include:  Fatigue.  Weakness.  Weight loss.  Confusion.  Abdominal pain.  Yellowing of your  skin and the white parts of your eyes (jaundice).  Nausea and vomiting.  How is this diagnosed? Fatty liver may be diagnosed by:  Physical exam and medical history.  Blood tests.  Imaging tests, such as an ultrasound, CT scan, or MRI.  Liver biopsy. A small sample of liver tissue is removed using a needle. The sample is then looked at under a microscope.  How is this treated? Fatty liver is often caused by other health conditions. Treatment for fatty liver may involve medicines and lifestyle changes to manage conditions such as:  Alcoholism.  High cholesterol.  Diabetes.  Being overweight or obese.  Follow these instructions at home:  Eat a healthy diet as directed by your health care provider.  Exercise regularly. This can help you lose weight and control your cholesterol and diabetes. Talk to your health care provider about an exercise plan and which activities are best for you.  Do not drink alcohol.  Take medicines only as directed by your health care provider. Contact a health care provider if: You have difficulty controlling your:  Blood sugar.  Cholesterol.  Alcohol consumption.  Get help right away if:  You have abdominal pain.  You have jaundice.  You have nausea and vomiting. This information is not intended to replace advice given to you by your health care provider. Make sure you discuss any questions you have with your health care provider. Document Released: 06/22/2005 Document Revised: 10/13/2015 Document Reviewed: 09/16/2013 Elsevier Interactive Patient Education  [employer].

## 2017-02-21 NOTE — Progress Notes (Signed)
Patient ID: [name]                 DOB: Jan 24, 1951                    MRN: [id number]     HPI: [name] is a 66 y.o. male patient referred to lipid clinic by Dr Stanford Breed. PMH is significant for CAD, hyperlipidemia, mitral valve repain in 2009, fatty liver, and elevated LFTs with statin usage.  Noted family history of hyperlipidemia from his father side. Patient presets today for lipid management and potential PCSK9i initiation. Reports taking Lovaza and Antara (fenofibrate) only. Currently working on improving his diet and physical activity.   Current Medications:  Fenofibrate 56m daily  Lovaza 2g twice daily  Intolerances:  Atorvastatin elevated LFTs and joint pain Rosuvastatin - elevated LFTs Simvastatin 432mdaily - elevated LFTs Welchol 3.75g daily - upset stomach and lack response Ezetimibe - elevated LFTs  LDL goal: < 7049mL  Diet: Most changes in last 3-4 weeks.Decreased red meat, fried foods, pasta and bacon intake. Mostly home cooked meals, eating more yogurt, fruits, chicken and vegetables.   Exercise: activities of daily living  Family History: high cholesterol from father  Social History: denies tobacco use, occasional alcohol intake (1 beer or wine with dinner but not daily)  Labs:CHO 232; TG 147; HDL 45; LDL-c 157 (lovaza and Antara) 8/31/201  (NOTED AST 61; ALT 117) - diagnosis of fatty liver  Past Medical History:  Diagnosis Date  . [place]  . CAD (coronary artery disease)    cath 06/2007 40% LM  . CVD (cerebrovascular disease)    59% right ICA, 49% left ICA; h/oTIA; fu study 8/11 with normal carotids  . Dysrhythmia   . Gait abnormality   . Heart murmur   . History of bronchitis   . History of colonoscopy   . Horseshoe kidney   . Hyperlipidemia   . MR (mitral regurgitation)    severe; mitral valve repair in march 2009  . MVP (mitral valve prolapse)   . Osteoarthritis    both knees  . Shortness of breath  dyspnea    increased exertion     Current Outpatient Prescriptions on File Prior to Visit  Medication Sig Dispense Refill  . amoxicillin (AMOXIL) 500 MG capsule Take 500 mg by mouth as directed. Before dental work    . aspirin 81 MG tablet Take 81 mg by mouth daily.    . Fenofibrate Micronized (ANTARA) 90 MG CAPS Take 1 capsule by mouth daily. 90 capsule 1  . LOVAZA 1 g capsule Take 2 capsules (2 g total) by mouth 2 (two) times daily. 360 capsule 3  . meloxicam (MOBIC) 15 MG tablet Take 1 tablet (15 mg total) by mouth daily. 90 tablet 1   No current facility-administered medications on file prior to visit.     Allergies  Allergen Reactions  . Statins Other (See Comments)    Messes with liver function  . Codeine     REACTION: nausea  . Tape Rash    Assessment/Plan:  1. Hyperlipidemia -  LDL remains elevated above goal and patient is unable to tolerate statin therapy. Zetia, plant sterols, and bile acid sequestrant also tried in significant ADRs and lack of appropriate therapeutic response. Triglycerides are better controlled with a combination of Omega-3 and fenofibrate but patient reports some GI discomfort with Lovaza. He is also working on diet changes and  increase in physical activity.   PCSK9i indication, mechanism of action, common side effect , storage, administration and financial responsibilities were discussed with patient and spouse during the appointment.   Will change Lovaza 2 grams twice daily to Viscepa 2 grams twice daily to improve GI symptoms and continue to improve cardiac outcomes. Will initiate paperwork for Repatha 147m every 14 days as requested by patient and continue follow up by phone as needed. Plan to request approval for Praluent if Repatha not prefer by patient's insurance.    Foy Vanduyne Rodriguez-Guzman PharmD, BCPS, [id number]/08/2016 12:59 PM

## 2017-02-28 ENCOUNTER — Telehealth: Payer: Self-pay | Admitting: Pharmacist Clinician (PhC)/ Clinical Pharmacy Specialist

## 2017-02-28 NOTE — Telephone Encounter (Signed)
Patient called to report he is feeling dizziness today and wants to know if the Vascepa is cause.   He had previously been on Lovaza.  Explained that this is not likely, but he should hold dose today and resume tomorrow.  If symptoms return he should let us know.  Patient voiced understanding

## 2017-03-04 ENCOUNTER — Other Ambulatory Visit: Payer: Self-pay | Admitting: Pharmacist

## 2017-03-04 MED ORDER — EVOLOCUMAB 140 MG/ML ~~LOC~~ SOAJ: 140.0000 mg | SUBCUTANEOUS | 11 refills | Status: DC

## 2017-03-27 ENCOUNTER — Other Ambulatory Visit: Payer: Self-pay | Admitting: Family Medicine

## 2017-03-27 DIAGNOSIS — M170 Bilateral primary osteoarthritis of knee: Secondary | ICD-10-CM

## 2017-03-27 DIAGNOSIS — E785 Hyperlipidemia, unspecified: Secondary | ICD-10-CM

## 2017-04-18 ENCOUNTER — Telehealth: Payer: Self-pay | Admitting: Pharmacist

## 2017-04-18 DIAGNOSIS — E785 Hyperlipidemia, unspecified: Secondary | ICD-10-CM

## 2017-04-18 MED ORDER — ICOSAPENT ETHYL 1 G PO CAPS: 2.0000 g | ORAL_CAPSULE | Freq: Two times a day (BID) | ORAL | 5 refills | Status: DC

## 2017-04-18 NOTE — Telephone Encounter (Signed)
Patient is taking Vascepa and 1st dose of Repatha 163m will be this Saturday (December/1st).  Patient is requesting tranfers prescription to [facility] mail order (local is possible).   Will repeat lipid panel during 1st week of February

## 2017-04-22 ENCOUNTER — Encounter: Payer: Self-pay | Admitting: Internal Medicine

## 2017-04-30 ENCOUNTER — Other Ambulatory Visit: Payer: Self-pay | Admitting: Pharmacist

## 2017-04-30 MED ORDER — EVOLOCUMAB 140 MG/ML ~~LOC~~ SOAJ: 140.0000 mg | SUBCUTANEOUS | 4 refills | Status: DC

## 2017-05-30 ENCOUNTER — Ambulatory Visit (INDEPENDENT_AMBULATORY_CARE_PROVIDER_SITE_OTHER): Payer: 59

## 2017-05-30 DIAGNOSIS — Z23 Encounter for immunization: Secondary | ICD-10-CM

## 2017-07-15 ENCOUNTER — Ambulatory Visit: Payer: 59 | Admitting: Family Medicine

## 2017-07-15 ENCOUNTER — Encounter: Payer: Self-pay | Admitting: Family Medicine

## 2017-07-15 VITALS — BP 136/86 | HR 72 | Temp 98.0°F | Resp 16 | Ht 70.0 in | Wt 235.6 lb

## 2017-07-15 DIAGNOSIS — K760 Fatty (change of) liver, not elsewhere classified: Secondary | ICD-10-CM | POA: Diagnosis not present

## 2017-07-15 DIAGNOSIS — R748 Abnormal levels of other serum enzymes: Secondary | ICD-10-CM | POA: Diagnosis not present

## 2017-07-15 DIAGNOSIS — E785 Hyperlipidemia, unspecified: Secondary | ICD-10-CM

## 2017-07-15 DIAGNOSIS — E7800 Pure hypercholesterolemia, unspecified: Secondary | ICD-10-CM

## 2017-07-15 LAB — LIPID PANEL
CHOL/HDL RATIO: 3
CHOLESTEROL: 129 mg/dL (ref 0–200)
HDL: 50.5 mg/dL (ref >39.00)
LDL Cholesterol: 61 mg/dL (ref 0–99)
NonHDL: 78.51
TRIGLYCERIDES: 90 mg/dL (ref 0.0–149.0)
VLDL: 18 mg/dL (ref 0.0–40.0)

## 2017-07-15 LAB — COMPREHENSIVE METABOLIC PANEL
ALBUMIN: 4.6 g/dL (ref 3.5–5.2)
ALK PHOS: 48 U/L (ref 39–117)
ALT: 67 U/L — AB (ref 0–53)
AST: 34 U/L (ref 0–37)
BILIRUBIN TOTAL: 0.6 mg/dL (ref 0.2–1.2)
BUN: 19 mg/dL (ref 6–23)
CALCIUM: 9.9 mg/dL (ref 8.4–10.5)
CO2: 26 mEq/L (ref 19–32)
CREATININE: 1.03 mg/dL (ref 0.40–1.50)
Chloride: 105 mEq/L (ref 96–112)
GFR: 77.22 mL/min (ref >60.00)
Glucose, Bld: 115 mg/dL — ABNORMAL HIGH (ref 70–99)
Potassium: 4.3 mEq/L (ref 3.5–5.1)
Sodium: 140 mEq/L (ref 135–145)
TOTAL PROTEIN: 7.2 g/dL (ref 6.0–8.3)

## 2017-07-15 LAB — HEPATIC FUNCTION PANEL
ALK PHOS: 48 U/L (ref 39–117)
ALT: 67 U/L — ABNORMAL HIGH (ref 0–53)
AST: 34 U/L (ref 0–37)
Albumin: 4.6 g/dL (ref 3.5–5.2)
Bilirubin, Direct: 0.1 mg/dL (ref 0.0–0.3)
TOTAL PROTEIN: 7.2 g/dL (ref 6.0–8.3)
Total Bilirubin: 0.6 mg/dL (ref 0.2–1.2)

## 2017-07-15 NOTE — Patient Instructions (Signed)

## 2017-07-15 NOTE — Assessment & Plan Note (Signed)
Check labs today.

## 2017-07-15 NOTE — Assessment & Plan Note (Signed)
On repatha --- check labs today

## 2017-07-15 NOTE — Progress Notes (Signed)
Patient ID: [name], male   DOB: 03/26/1951, 67 y.o.   MRN: [id number]    Subjective:  I acted as a Education administrator for Dr. Carollee Herter.  Guerry Bruin, [place]   Patient ID: [name], male    DOB: 04/20/1951, 67 y.o.   MRN: [id number]  Chief Complaint  Patient presents with  . Hyperlipidemia  . blood work    HPI  Patient is in today for follow up cholesterol and blood work for liver.  He is doing well on current treatment.  No complaints.   Going to lipid clinic for repatha.  He thinks he maybe having some side effects from the med but its not bad enough to stop.    Patient Care Team: Carollee Herter, Alferd Apa, DO as PCP - General (Family Medicine) Earnie Larsson, MD as Consulting Physician (Neurosurgery) Stanford Breed Denice Bors, MD as Consulting Physician (Cardiology) Gaynelle Arabian, MD as Consulting Physician (Orthopedic Surgery)   Past Medical History:  Diagnosis Date  . Atrial flutter ([place])    s/p ablation  . CAD (coronary artery disease)    cath 06/2007 40% LM  . CVD (cerebrovascular disease)    59% right ICA, 49% left ICA; h/oTIA; fu study 8/11 with normal carotids  . Dysrhythmia   . Gait abnormality   . Heart murmur   . History of bronchitis   . History of colonoscopy   . Horseshoe kidney   . Hyperlipidemia   . MR (mitral regurgitation)    severe; mitral valve repair in march 2009  . MVP (mitral valve prolapse)   . Osteoarthritis    both knees  . Shortness of breath dyspnea    increased exertion     Past Surgical History:  Procedure Laterality Date  . BACK SURGERY     secondary to ruptured disc  . CARDIAC CATHETERIZATION    . heart ablation    . MITRAL VALVE REPAIR  06/2007  . TONSILLECTOMY  1960  . TOTAL KNEE ARTHROPLASTY Bilateral 03/09/2015   Procedure: TOTAL KNEE BILATERAL;  Surgeon: Gaynelle Arabian, MD;  Location: [facility];  Service: Orthopedics;  Laterality: Bilateral;    Family History  Problem Relation Age of Onset  . Liver disease Father        cancer  .  Hyperlipidemia Father   . Liver cancer Father   . Diabetes Unknown   . Colitis Daughter   . Crohn's disease Daughter   . Colon cancer Neg Hx   . Esophageal cancer Neg Hx   . Rectal cancer Neg Hx   . Stomach cancer Neg Hx     Social History   Socioeconomic History  . Marital status: Married    Spouse name: Not on file  . Number of children: Not on file  . Years of education: Not on file  . Highest education level: Not on file  Social Needs  . Financial resource strain: Not on file  . Food insecurity - worry: Not on file  . Food insecurity - inability: Not on file  . Transportation needs - medical: Not on file  . Transportation needs - non-medical: Not on file  Occupational History  . Occupation: Audiological scientist    Comment: works from home  Tobacco Use  . Smoking status: Never Smoker  . Smokeless tobacco: Never Used  Substance and Sexual Activity  . Alcohol use: Yes    Alcohol/week: 0.0 oz    Comment: occasional 1-2 wine drinks a month  . Drug use: No  .  Sexual activity: Yes    Partners: Female  Other Topics Concern  . Not on file  Social History Narrative   Exercise--  Total gym    Outpatient Medications Prior to Visit  Medication Sig Dispense Refill  . amoxicillin (AMOXIL) 500 MG capsule Take 500 mg by mouth as directed. Before dental work    . ANTARA 90 MG CAPS TAKE 1 CAPSULE BY MOUTH EVERY DAY 90 capsule 1  . aspirin 81 MG tablet Take 81 mg by mouth daily.    . Evolocumab (REPATHA SURECLICK) 791 MG/ML SOAJ Inject 140 mg into the skin every 14 (fourteen) days. 6 pen 4  . Icosapent Ethyl 1 g CAPS Take 2 g by mouth 2 (two) times daily. 120 capsule 5  . meloxicam (MOBIC) 15 MG tablet TAKE 1 TABLET BY MOUTH EVERY DAY 90 tablet 1   No facility-administered medications prior to visit.     Allergies  Allergen Reactions  . Statins Other (See Comments)    Messes with liver function  . Codeine     REACTION: nausea  . Tape Rash    Review of Systems    Constitutional: Negative for chills, fever and malaise/fatigue.  HENT: Negative for congestion and hearing loss.   Eyes: Negative for discharge.  Respiratory: Negative for cough, sputum production and shortness of breath.   Cardiovascular: Negative for chest pain, palpitations and leg swelling.  Gastrointestinal: Negative for abdominal pain, blood in stool, constipation, diarrhea, heartburn, nausea and vomiting.  Genitourinary: Negative for dysuria, frequency, hematuria and urgency.  Musculoskeletal: Negative for back pain, falls and myalgias.  Skin: Negative for rash.  Neurological: Negative for dizziness, sensory change, loss of consciousness, weakness and headaches.  Endo/Heme/Allergies: Negative for environmental allergies. Does not bruise/bleed easily.  Psychiatric/Behavioral: Negative for depression and suicidal ideas. The patient is not nervous/anxious and does not have insomnia.        Objective:    Physical Exam  Constitutional: He is oriented to person, place, and time. Vital signs are normal. He appears well-developed and well-nourished. He is sleeping.  HENT:  Head: Normocephalic and atraumatic.  Mouth/Throat: Oropharynx is clear and moist.  Eyes: EOM are normal. Pupils are equal, round, and reactive to light.  Neck: Normal range of motion. Neck supple. No thyromegaly present.  Cardiovascular: Normal rate and regular rhythm.  Murmur heard. Pulmonary/Chest: Effort normal and breath sounds normal. No respiratory distress. He has no wheezes. He has no rales. He exhibits no tenderness.  Musculoskeletal: He exhibits no edema or tenderness.  Neurological: He is alert and oriented to person, place, and time.  Skin: Skin is warm and dry.  Psychiatric: He has a normal mood and affect. His behavior is normal. Judgment and thought content normal.  Nursing note and vitals reviewed.   BP 136/86 (BP Location: Left Arm, Cuff Size: Normal)   Pulse 72   Temp 98 F (36.7 C) (Oral)    Resp 16   Ht 5' 10"  (1.778 m)   Wt 235 lb 9.6 oz (106.9 kg)   SpO2 96%   BMI 33.81 kg/m  Wt Readings from Last 3 Encounters:  07/15/17 235 lb 9.6 oz (106.9 kg)  02/13/17 237 lb 6.4 oz (107.7 kg)  01/18/17 239 lb 3.2 oz (108.5 kg)   BP Readings from Last 3 Encounters:  07/15/17 136/86  02/13/17 (!) 153/96  01/18/17 140/90     Immunization History  Administered Date(s) Administered  . Influenza Split 02/26/2012, 02/24/2013  . Influenza Whole 03/22/2008, 04/29/2009, 04/19/2010  .  Influenza,inj,Quad PF,6+ Mos 03/09/2014, 03/15/2015, 04/03/2016, 05/30/2017  . Pneumococcal Polysaccharide-23 12/03/2007, 02/24/2013  . Td 07/28/2001  . Tdap 02/26/2012  . Zoster 09/17/2014  . Zoster Recombinat (Shingrix) 10/01/2016, 01/18/2017    Health Maintenance  Topic Date Due  . COLONOSCOPY  03/31/2017  . TETANUS/TDAP  02/25/2022  . INFLUENZA VACCINE  Completed  . Hepatitis C Screening  Completed  . HIV Screening  Completed    Lab Results  Component Value Date   WBC 5.0 01/18/2017   HGB 15.2 01/18/2017   HCT 44.3 01/18/2017   PLT 177.0 01/18/2017   GLUCOSE 103 (H) 01/18/2017   CHOL 232 (H) 01/18/2017   TRIG 147.0 01/18/2017   HDL 45.60 01/18/2017   LDLDIRECT 147.0 01/16/2016   LDLCALC 157 (H) 01/18/2017   ALT 117 (H) 01/18/2017   AST 61 (H) 01/18/2017   NA 139 01/18/2017   K 4.3 01/18/2017   CL 104 01/18/2017   CREATININE 1.12 01/18/2017   BUN 23 01/18/2017   CO2 27 01/18/2017   TSH 1.44 01/18/2017   PSA 1.45 01/18/2017   INR 2.34 (H) 03/23/2015   HGBA1C 6.1 10/01/2016    Lab Results  Component Value Date   TSH 1.44 01/18/2017   Lab Results  Component Value Date   WBC 5.0 01/18/2017   HGB 15.2 01/18/2017   HCT 44.3 01/18/2017   MCV 92.7 01/18/2017   PLT 177.0 01/18/2017   Lab Results  Component Value Date   NA 139 01/18/2017   K 4.3 01/18/2017   CO2 27 01/18/2017   GLUCOSE 103 (H) 01/18/2017   BUN 23 01/18/2017   CREATININE 1.12 01/18/2017   BILITOT 0.8  01/18/2017   ALKPHOS 54 01/18/2017   AST 61 (H) 01/18/2017   ALT 117 (H) 01/18/2017   PROT 7.0 01/18/2017   ALBUMIN 4.8 01/18/2017   CALCIUM 9.9 01/18/2017   ANIONGAP 7 03/21/2015   GFR 70.21 01/18/2017   Lab Results  Component Value Date   CHOL 232 (H) 01/18/2017   Lab Results  Component Value Date   HDL 45.60 01/18/2017   Lab Results  Component Value Date   LDLCALC 157 (H) 01/18/2017   Lab Results  Component Value Date   TRIG 147.0 01/18/2017   Lab Results  Component Value Date   CHOLHDL 5 01/18/2017   Lab Results  Component Value Date   HGBA1C 6.1 10/01/2016         Assessment & Plan:   Problem List Items Addressed This Visit      Unprioritized   Fatty liver disease, nonalcoholic    Check labs today      Hyperlipidemia LDL goal <100   Relevant Orders   Comprehensive metabolic panel   Lipid panel   HYPERLIPIDEMIA, FAMILIAL    On repatha --- check labs today       Other Visit Diagnoses    Elevated liver enzymes    -  Primary   Relevant Orders   Hepatic function panel      I am having [name] maintain his aspirin, amoxicillin, ANTARA, meloxicam, Icosapent Ethyl, and Evolocumab.  No orders of the defined types were placed in this encounter.   CMA served as Education administrator during this visit. History, Physical and Plan performed by medical provider. Documentation and orders reviewed and attested to.  Ann Held, DO

## 2017-09-09 ENCOUNTER — Encounter: Payer: Self-pay | Admitting: Cardiology

## 2017-09-09 NOTE — Progress Notes (Addendum)
Cardiology Office Note   Date:  09/10/2017   ID:  JAHEL WAVRA, DOB 03-29-1951, MRN [id number]  PCP:  Carollee Herter, Alferd Apa, DO  Cardiologist:  [facility]   Chief Complaint  Patient presents with  . Fatigue  . Palpitations     History of Present Illness: [name] is a 67 y.o. male who presents for ongoing assessment and management of severe mitral regurgitation, preoperative catheterization revealed a 40% left main 30% LAD and 30% right coronary artery. He has also had atrial flutter ablation in March 2009.  The patient was last seen by Dr. Stanford Breed on 02/13/2017.    The patient to be continued on SBE prophylaxis in the setting of mitral valve repair.  On last visit with Dr. Stanford Breed a echocardiogram was ordered for ongoing management and surveillance of mitral valve and LV function.  Echocardiogram dated 02/19/2017 revealed normal LV systolic function with an EF of 00%, grade 2 diastolic dysfunction, mild stenosis of the mitral valve with mild to moderate regurgitation directed eccentrically.  Left atrium was moderately dilated.  The patient was most recently seen by her primary care provider for physical.  The patient was noted to be followed by the lipid clinic for [place].  He states that he has been feeling tired, mildly short of breath, and feeling palpitations.  Sometimes he states he cannot feel his pulse at all.  This is causing him some anxiety.  He denies associated chest pain, dizziness, or near syncope.   Past Medical History:  Diagnosis Date  . Atrial flutter ([place])    s/p ablation  . CAD (coronary artery disease)    cath 06/2007 40% LM  . CVD (cerebrovascular disease)    59% right ICA, 49% left ICA; h/oTIA; fu study 8/11 with normal carotids  . Dysrhythmia   . Gait abnormality   . Heart murmur   . History of bronchitis   . History of colonoscopy   . Horseshoe kidney   . Hyperlipidemia   . MR (mitral regurgitation)    severe; mitral valve repair in march  2009  . MVP (mitral valve prolapse)   . Osteoarthritis    both knees  . Shortness of breath dyspnea    increased exertion     Past Surgical History:  Procedure Laterality Date  . BACK SURGERY     secondary to ruptured disc  . CARDIAC CATHETERIZATION    . heart ablation    . MITRAL VALVE REPAIR  06/2007  . TONSILLECTOMY  1960  . TOTAL KNEE ARTHROPLASTY Bilateral 03/09/2015   Procedure: TOTAL KNEE BILATERAL;  Surgeon: Gaynelle Arabian, MD;  Location: [facility];  Service: Orthopedics;  Laterality: Bilateral;     Current Outpatient Medications  Medication Sig Dispense Refill  . amoxicillin (AMOXIL) 500 MG capsule Take 500 mg by mouth as directed. Before dental work    . ANTARA 90 MG CAPS TAKE 1 CAPSULE BY MOUTH EVERY DAY 90 capsule 1  . aspirin 81 MG tablet Take 81 mg by mouth daily.    . Evolocumab (REPATHA SURECLICK) 174 MG/ML SOAJ Inject 140 mg into the skin every 14 (fourteen) days. 6 pen 4  . Icosapent Ethyl 1 g CAPS Take 2 g by mouth 2 (two) times daily. 120 capsule 5  . meloxicam (MOBIC) 15 MG tablet TAKE 1 TABLET BY MOUTH EVERY DAY 90 tablet 1  . apixaban (ELIQUIS) 5 MG TABS tablet Take 1 tablet (5 mg total) by mouth 2 (two) times daily. 60 tablet  3  . metoprolol tartrate (LOPRESSOR) 25 MG tablet Take 1 tablet (25 mg total) by mouth 2 (two) times daily. 60 tablet 3   No current facility-administered medications for this visit.     Allergies:   Statins; Codeine; and Tape    Social History:  The patient  reports that he has never smoked. He has never used smokeless tobacco. He reports that he drinks alcohol. He reports that he does not use drugs.   Family History:  The patient's family history includes Colitis in his daughter; Crohn's disease in his daughter; Diabetes in his unknown relative; Hyperlipidemia in his father; Liver cancer in his father; Liver disease in his father.    ROS: All other systems are reviewed and negative. Unless otherwise mentioned in H&P    PHYSICAL  EXAM: VS:  BP (!) 140/110   Pulse (!) 129   Ht 5' 10"  (1.778 m)   Wt 237 lb (107.5 kg)   BMI 34.01 kg/m  , BMI Body mass index is 34.01 kg/m. GEN: Well nourished, well developed, in no acute distress  HEENT: normal  Neck: no JVD, carotid bruits, or masses Cardiac: RRR, tachycardic; no murmurs, rubs, or gallops,non-pitting  edema  Respiratory:  Clear to auscultation bilaterally, normal work of breathing GI: soft, nontender, nondistended, + BS, obese MS: no deformity or atrophy  Skin: warm and dry, no rash Neuro:  Strength and sensation are intact Psych: euthymic mood, full affect   EKG: Atrial flutter, heart rate of 129 bpm, nonspecific ST and T wave abnormalities noted.  Motion artifact is seen.  Recent Labs: 01/18/2017: Hemoglobin 15.2; Platelets 177.0; TSH 1.44 07/15/2017: ALT 67; ALT 67; BUN 19; Creatinine, Ser 1.03; Potassium 4.3; Sodium 140    Lipid Panel    Component Value Date/Time   CHOL 129 07/15/2017 0855   TRIG 90.0 07/15/2017 0855   HDL 50.50 07/15/2017 0855   CHOLHDL 3 07/15/2017 0855   VLDL 18.0 07/15/2017 0855   LDLCALC 61 07/15/2017 0855   LDLDIRECT 147.0 01/16/2016 0908      Wt Readings from Last 3 Encounters:  09/10/17 237 lb (107.5 kg)  07/15/17 235 lb 9.6 oz (106.9 kg)  02/13/17 237 lb 6.4 oz (107.7 kg)    Other studies Reviewed: Echocardiogram 02/26/2017  Left ventricle: The cavity size was normal. Wall thickness was   normal. Systolic function was normal. The estimated ejection   fraction was 55%. Wall motion was normal; there were no regional   wall motion abnormalities. Features are consistent with a   pseudonormal left ventricular filling pattern, with concomitant   abnormal relaxation and increased filling pressure (grade 2   diastolic dysfunction). - Aortic valve: There was mild regurgitation. - Mitral valve: Noncalcified, moderately thickened annulus. Mildly   thickened leaflets . Thickening. The findings are consistent with   mild  stenosis. There was mild to moderate regurgitation directed   eccentrically. Valve area by pressure half-time: 1.85 cm^2. Valve   area by continuity equation (using LVOT flow): 1.8 cm^2.   following mitral valve repair. - Left atrium: The atrium was moderately dilated. - Dilated IVC with variation.   ASSESSMENT AND PLAN:  1.  Recurrent atrial flutter: Heart rate is elevated at 126 bpm.  The patient states that he feels this sometimes and on occasion he feels his heart rate normalized on its own.  Likely paroxysmal atrial flutter although there is no documentation of flutter/normal sinus rhythm episodes.  Today his EKG confirms atrial flutter with high ventricular response.  He has 2:1 AV block.  I will begin the patient on Eliquis 5 mg twice daily metoprolol 25 mg twice daily.  I have spoken with Dr. Stanford Breed, the patient's primary cardiologist, because this patient's case with him.  Dr. Stanford Breed has reviewed the notes.  It is recommended that the patient be set up for a TEE cardioversion in 2 weeks versus a plain cardioversion in 1 month depending upon timing.  I have explained the risk and benefits of the procedure. He verbalizes understanding and is willing to proceed.   I have discussed this with the patient and his wife who would like to have this taken care of sooner than later and therefore have elected to be scheduled for TEE cardioversion.  The patient is scheduled for Sep 23, 2017 with Dr. Stanford Breed at 10 AM.  In the interim the patient will continue new therapy with beta-blocker and anticoagulation therapy.    I have discussed the risks and benefits of anticoagulation therapy and they are willing to take the medications.  The patient will come back in 1 week for a EKG to evaluate his response to medication and heart rate.  He may convert to normal sinus rhythm on p.o. metoprolol.  Labs will also be ordered in anticipation of TEE.  2.  Hypertension: Blood pressure is not adequately  controlled today.  Addition of metoprolol may help to remedy this.  He will continue his other medications as directed.  He is on meloxicam 15 mg but he does not take it daily.  3.  Status post mitral valve repair: The patient had mitral valve repair in 2008 with atrial flutter ablation occurred post surgical repair.  He is not been on any anticoagulation for several years now.  He is on SBE prophylaxis however.   Current medicines are reviewed at length with the patient today.    Labs/ tests ordered today include: Transesophageal echocardiogram pre- procedure labs.  I have spent greater than 45 minutes with this patient and his wife explaining plan, medication management, and scheduling appointment.   Phill Myron. West Pugh, ANP, AACC   09/10/2017 4:09 PM    [facility] [address], [place], [place] [address] Phone: [phone]; Fax: [phone]

## 2017-09-10 ENCOUNTER — Ambulatory Visit: Payer: 59 | Admitting: Adult Health

## 2017-09-10 ENCOUNTER — Encounter: Payer: Self-pay | Admitting: Adult Health

## 2017-09-10 VITALS — BP 140/110 | HR 129 | Ht 70.0 in | Wt 237.0 lb

## 2017-09-10 DIAGNOSIS — Z79899 Other long term (current) drug therapy: Secondary | ICD-10-CM | POA: Diagnosis not present

## 2017-09-10 DIAGNOSIS — I4892 Unspecified atrial flutter: Secondary | ICD-10-CM

## 2017-09-10 DIAGNOSIS — Z9889 Other specified postprocedural states: Secondary | ICD-10-CM

## 2017-09-10 DIAGNOSIS — I10 Essential (primary) hypertension: Secondary | ICD-10-CM | POA: Diagnosis not present

## 2017-09-10 DIAGNOSIS — I2510 Atherosclerotic heart disease of native coronary artery without angina pectoris: Secondary | ICD-10-CM

## 2017-09-10 MED ORDER — METOPROLOL TARTRATE 25 MG PO TABS: 25.0000 mg | ORAL_TABLET | Freq: Two times a day (BID) | ORAL | 3 refills | Status: DC

## 2017-09-10 MED ORDER — APIXABAN 5 MG PO TABS: 5.0000 mg | ORAL_TABLET | Freq: Two times a day (BID) | ORAL | 3 refills | Status: DC

## 2017-09-10 NOTE — Patient Instructions (Addendum)
Medication Instructions:  START ELIQUIS 5MG TWICE DAILY  START METOPROLOL 25MG TWICE DAILY  If you need a refill on your cardiac medications before your next appointment, please call your pharmacy.  Labwork: CBC AND BMET TODAY HERE IN OUR OFFICE AT [facility]  Take the provided lab slips with you to the lab for your blood draw.   Testing/Procedures: Your physician has requested that you have a TEE/Cardioversion-ON 09-23-2017 [place]. During a TEE, sound waves are used to create images of your heart. It provides your doctor with information about the size and shape of your heart and how well your heart's chambers and valves are working. In this test, a transducer is attached to the end of a flexible tube that is guided down you throat and into your esophagus (the tube leading from your mouth to your stomach) to get a more detailed image of your heart. Once the TEE has determined that a blood clot is not present, the cardioversion begins. Electrical Cardioversion uses a jolt of electricity to your heart either through paddles or wired patches attached to your chest. This is a controlled, usually prescheduled, procedure. This procedure is done at the hospital and you are not awake during the procedure. You usually go home the day of the procedure. Please see the instruction sheet given to you today for more information.  Follow-Up: Your physician wants you to follow-up in: [place] ([place]), DNP,AACC IF PRIMARY CARDIOLOGIST IS UNAVAILABLE.   Your physician wants you to follow-up IN 1 WEEK WITH AND EKG ONLY APPT   Thank you for choosing [facility] at [facility]!!

## 2017-09-11 ENCOUNTER — Encounter: Payer: Self-pay | Admitting: Family Medicine

## 2017-09-11 NOTE — Addendum Note (Signed)
Addended by: Lendon Colonel on: 09/11/2017 06:52 AM   Modules accepted: Miquel Dunn

## 2017-09-12 ENCOUNTER — Telehealth: Payer: Self-pay | Admitting: *Deleted

## 2017-09-12 NOTE — Telephone Encounter (Signed)
[employer] called for patient to ask about insurance.  She will advise patient that we are currently working on this issue and we will notify when it is worked out.

## 2017-09-17 ENCOUNTER — Ambulatory Visit (INDEPENDENT_AMBULATORY_CARE_PROVIDER_SITE_OTHER): Payer: 59 | Admitting: *Deleted

## 2017-09-17 ENCOUNTER — Encounter: Payer: Self-pay | Admitting: *Deleted

## 2017-09-17 VITALS — HR 129

## 2017-09-17 DIAGNOSIS — I4892 Unspecified atrial flutter: Secondary | ICD-10-CM | POA: Diagnosis not present

## 2017-09-17 DIAGNOSIS — Z79899 Other long term (current) drug therapy: Secondary | ICD-10-CM | POA: Diagnosis not present

## 2017-09-17 NOTE — Progress Notes (Signed)
1.) Reason for visit obtain EKG  - PATIENT SCHEDULE FOR EKG ON Monday 09/23/2017  2.) Name of MD requesting visit : KATHRYN LAWRENCE DPN   3.) H&P N/A  4.) ROS related to problem PATIENT STATES  HE gets a tightness in  Chest on occasion .  Congestion - patient states it started after taking the metoprolol - coughing up yellowish thick  Phlegm - , cough occasional.  - may take mucinex or Claritin , or zyrtec nothing with decongestant. If phelgm becomes green , more pronounce contact primary .RN informed patient to contact primary - sinus- lung issues.   5.) Assessment and plan per DNP:  REVIEWED EKG .  PER DR Bernita Raisin WITH CURRENT MEDICATIONS -  HAVE LABWORK TODAY .  PROCEED WITH CARDIOVERSION.patient also brought readings  Of recent blood pressures 102-149/68/103- reviewed. Patient and wife  Verbalized understanding.

## 2017-09-18 LAB — BASIC METABOLIC PANEL
BUN / CREAT RATIO: 15 (ref 10–24)
BUN: 16 mg/dL (ref 8–27)
CO2: 21 mmol/L (ref 20–29)
Calcium: 9.3 mg/dL (ref 8.6–10.2)
Chloride: 101 mmol/L (ref 96–106)
Creatinine, Ser: 1.07 mg/dL (ref 0.76–1.27)
GFR calc Af Amer: 84 mL/min/{1.73_m2} (ref >59)
GFR, EST NON AFRICAN AMERICAN: 73 mL/min/{1.73_m2} (ref >59)
Glucose: 110 mg/dL — ABNORMAL HIGH (ref 65–99)
POTASSIUM: 4.3 mmol/L (ref 3.5–5.2)
SODIUM: 141 mmol/L (ref 134–144)

## 2017-09-18 LAB — CBC
HEMATOCRIT: 44.9 % (ref 37.5–51.0)
Hemoglobin: 15.5 g/dL (ref 13.0–17.7)
MCH: 31.6 pg (ref 26.6–33.0)
MCHC: 34.5 g/dL (ref 31.5–35.7)
MCV: 91 fL (ref 79–97)
Platelets: 187 10*3/uL (ref 150–379)
RBC: 4.91 x10E6/uL (ref 4.14–5.80)
RDW: 12.6 % (ref 12.3–15.4)
WBC: 6.3 10*3/uL (ref 3.4–10.8)

## 2017-09-22 ENCOUNTER — Other Ambulatory Visit: Payer: Self-pay | Admitting: Family Medicine

## 2017-09-22 DIAGNOSIS — E785 Hyperlipidemia, unspecified: Secondary | ICD-10-CM

## 2017-09-22 DIAGNOSIS — M170 Bilateral primary osteoarthritis of knee: Secondary | ICD-10-CM

## 2017-09-23 ENCOUNTER — Ambulatory Visit (HOSPITAL_COMMUNITY): Payer: 59 | Admitting: Certified Registered Nurse Anesthetist

## 2017-09-23 ENCOUNTER — Other Ambulatory Visit: Payer: Self-pay

## 2017-09-23 ENCOUNTER — Encounter (HOSPITAL_COMMUNITY)
Admission: RE | Disposition: A | Discharge status: Home / Self Care | Payer: Self-pay | Source: Ambulatory Visit | Attending: Cardiology

## 2017-09-23 ENCOUNTER — Ambulatory Visit (HOSPITAL_BASED_OUTPATIENT_CLINIC_OR_DEPARTMENT_OTHER)
Admission: RE | Admit: 2017-09-23 | Discharge: 2017-09-23 | Disposition: A | Discharge status: Home / Self Care | Payer: 59 | Source: Ambulatory Visit | Attending: Cardiology | Admitting: Cardiology

## 2017-09-23 ENCOUNTER — Ambulatory Visit (HOSPITAL_COMMUNITY)
Admission: RE | Admit: 2017-09-23 | Discharge: 2017-09-23 | Disposition: A | Discharge status: Home / Self Care | Payer: 59 | Source: Ambulatory Visit | Attending: Cardiology | Admitting: Cardiology

## 2017-09-23 ENCOUNTER — Encounter (HOSPITAL_COMMUNITY): Payer: Self-pay | Admitting: *Deleted

## 2017-09-23 DIAGNOSIS — Z96653 Presence of artificial knee joint, bilateral: Secondary | ICD-10-CM | POA: Insufficient documentation

## 2017-09-23 DIAGNOSIS — Z7982 Long term (current) use of aspirin: Secondary | ICD-10-CM | POA: Insufficient documentation

## 2017-09-23 DIAGNOSIS — Z7901 Long term (current) use of anticoagulants: Secondary | ICD-10-CM | POA: Diagnosis not present

## 2017-09-23 DIAGNOSIS — I340 Nonrheumatic mitral (valve) insufficiency: Secondary | ICD-10-CM

## 2017-09-23 DIAGNOSIS — I4892 Unspecified atrial flutter: Secondary | ICD-10-CM | POA: Diagnosis not present

## 2017-09-23 DIAGNOSIS — Z885 Allergy status to narcotic agent status: Secondary | ICD-10-CM | POA: Diagnosis not present

## 2017-09-23 DIAGNOSIS — I2510 Atherosclerotic heart disease of native coronary artery without angina pectoris: Secondary | ICD-10-CM | POA: Diagnosis not present

## 2017-09-23 DIAGNOSIS — Z79899 Other long term (current) drug therapy: Secondary | ICD-10-CM | POA: Diagnosis not present

## 2017-09-23 HISTORY — PX: TEE WITHOUT CARDIOVERSION: SHX5443

## 2017-09-23 HISTORY — PX: CARDIOVERSION: SHX1299

## 2017-09-23 SURGERY — CARDIOVERSION
Anesthesia: General

## 2017-09-23 MED ORDER — BUTAMBEN-TETRACAINE-BENZOCAINE 2-2-14 % EX AERO
INHALATION_SPRAY | CUTANEOUS | Status: DC | PRN
  Administered 2017-09-23: 2 via TOPICAL

## 2017-09-23 MED ORDER — PROPOFOL 10 MG/ML IV BOLUS
INTRAVENOUS | Status: DC | PRN
  Administered 2017-09-23 (×2): 20 mg via INTRAVENOUS
  Administered 2017-09-23: 10 mg via INTRAVENOUS

## 2017-09-23 MED ORDER — SODIUM CHLORIDE 0.9 % IV SOLN: INTRAVENOUS | Status: DC

## 2017-09-23 MED ORDER — LIDOCAINE 2% (20 MG/ML) 5 ML SYRINGE
INTRAMUSCULAR | Status: DC | PRN
  Administered 2017-09-23: 40 mg via INTRAVENOUS

## 2017-09-23 MED ORDER — PROPOFOL 500 MG/50ML IV EMUL
INTRAVENOUS | Status: DC | PRN
  Administered 2017-09-23: 75 ug/kg/min via INTRAVENOUS

## 2017-09-23 NOTE — Transfer of Care (Signed)
Immediate Anesthesia Transfer of Care Note  Patient: [name]  Procedure(s) Performed: CARDIOVERSION (N/A ) TRANSESOPHAGEAL ECHOCARDIOGRAM (TEE) (N/A )  Patient Location: Endoscopy Unit  Anesthesia Type:MAC  Level of Consciousness: awake and oriented  Airway & Oxygen Therapy: Patient Spontanous Breathing  Post-op Assessment: Report given to RN  Post vital signs: Reviewed and stable  Last Vitals:  Vitals Value Taken Time  BP    Temp 36.6 C 09/23/2017 10:23 AM  Pulse 90 09/23/2017 10:23 AM  Resp 26 09/23/2017 10:23 AM  SpO2 92 % 09/23/2017 10:23 AM  Vitals shown include unvalidated device data.  Last Pain:  Vitals:   09/23/17 1023  TempSrc: Oral  PainSc:          Complications: No apparent anesthesia complications

## 2017-09-23 NOTE — Interval H&P Note (Signed)
History and Physical Interval Note:  09/23/2017 9:39 AM  [name]  has presented today for surgery, with the diagnosis of aflutter  The various methods of treatment have been discussed with the patient and family. After consideration of risks, benefits and other options for treatment, the patient has consented to  Procedure(s): CARDIOVERSION (N/A) TRANSESOPHAGEAL ECHOCARDIOGRAM (TEE) (N/A) as a surgical intervention .  The patient's history has been reviewed, patient examined, no change in status, stable for surgery.  I have reviewed the patient's chart and labs.  Questions were answered to the patient's satisfaction.     Kirk Ruths

## 2017-09-23 NOTE — Discharge Instructions (Signed)
TEE  YOU HAD AN CARDIAC PROCEDURE TODAY: Refer to the procedure report and other information in the discharge instructions given to you for any specific questions about what was found during the examination. If this information does not answer your questions, please call [facility] office at [phone] to clarify.   DIET: Your first meal following the procedure should be a light meal and then it is ok to progress to your normal diet. A half-sandwich or bowl of soup is an example of a good first meal. Heavy or fried foods are harder to digest and may make you feel nauseous or bloated. Drink plenty of fluids but you should avoid alcoholic beverages for 24 hours. If you had a esophageal dilation, please see attached instructions for diet.   ACTIVITY: Your care partner should take you home directly after the procedure. You should plan to take it easy, moving slowly for the rest of the day. You can resume normal activity the day after the procedure however YOU SHOULD NOT DRIVE, use power tools, machinery or perform tasks that involve climbing or major physical exertion for 24 hours (because of the sedation medicines used during the test).   SYMPTOMS TO REPORT IMMEDIATELY: A cardiologist can be reached at any hour. Please call [phone] for any of the following symptoms:  Vomiting of blood or coffee ground material  New, significant abdominal pain  New, significant chest pain or pain under the shoulder blades  Painful or persistently difficult swallowing  New shortness of breath  Black, tarry-looking or red, bloody stools  FOLLOW UP:  Please also call with any specific questions about appointments or follow up tests.  Electrical Cardioversion, Care After This sheet gives you information about how to care for yourself after your procedure. Your health care provider may also give you more specific instructions. If you have problems or questions, contact your health care provider. What can I  expect after the procedure? After the procedure, it is common to have:  Some redness on the skin where the shocks were given.  Follow these instructions at home:  Do not drive for 24 hours if you were given a medicine to help you relax (sedative).  Take over-the-counter and prescription medicines only as told by your health care provider.  Ask your health care provider how to check your pulse. Check it often.  Rest for 48 hours after the procedure or as told by your health care provider.  Avoid or limit your caffeine use as told by your health care provider. Contact a health care provider if:  You feel like your heart is beating too quickly or your pulse is not regular.  You have a serious muscle cramp that does not go away. Get help right away if:  You have discomfort in your chest.  You are dizzy or you feel faint.  You have trouble breathing or you are short of breath.  Your speech is slurred.  You have trouble moving an arm or leg on one side of your body.  Your fingers or toes turn cold or blue. This information is not intended to replace advice given to you by your health care provider. Make sure you discuss any questions you have with your health care provider. Document Released: 02/25/2013 Document Revised: 12/09/2015 Document Reviewed: 11/11/2015 Elsevier Interactive Patient Education  [employer].

## 2017-09-23 NOTE — Anesthesia Postprocedure Evaluation (Signed)
Anesthesia Post Note  Patient: [name]  Procedure(s) Performed: CARDIOVERSION (N/A ) TRANSESOPHAGEAL ECHOCARDIOGRAM (TEE) (N/A )     Patient location during evaluation: PACU Anesthesia Type: General Level of consciousness: awake and alert Pain management: pain level controlled Vital Signs Assessment: post-procedure vital signs reviewed and stable Respiratory status: spontaneous breathing, nonlabored ventilation, respiratory function stable and patient connected to nasal cannula oxygen Cardiovascular status: blood pressure returned to baseline and stable Postop Assessment: no apparent nausea or vomiting Anesthetic complications: no    Last Vitals:  Vitals:   09/23/17 0859 09/23/17 1023  BP: (!) 139/106 (!) 100/57  Pulse: (!) 130 90  Resp: 14 (!) 30  Temp: 36.5 C 36.6 C  SpO2: 98% 93%    Last Pain:  Vitals:   09/23/17 1023  TempSrc: Oral  PainSc: 0-No pain                 Akyra Bouchie S

## 2017-09-23 NOTE — Anesthesia Preprocedure Evaluation (Addendum)
Anesthesia Evaluation  Patient identified by MRN, date of birth, ID band Patient awake    Reviewed: Allergy & Precautions, NPO status , Patient's Chart, lab work & pertinent test results  Airway Mallampati: II  TM Distance: >3 FB Neck ROM: Full    Dental no notable dental hx. (+) Teeth Intact, Dental Advisory Given   Pulmonary neg pulmonary ROS,    Pulmonary exam normal breath sounds clear to auscultation       Cardiovascular + Peripheral Vascular Disease  + dysrhythmias Atrial Fibrillation  Rhythm:Irregular Rate:Normal + Systolic murmurs mitral valve repair in march 2009   Neuro/Psych negative neurological ROS  negative psych ROS   GI/Hepatic negative GI ROS, Neg liver ROS,   Endo/Other  negative endocrine ROS  Renal/GU negative Renal ROS  negative genitourinary   Musculoskeletal negative musculoskeletal ROS (+)   Abdominal   Peds negative pediatric ROS (+)  Hematology negative hematology ROS (+)   Anesthesia Other Findings   Reproductive/Obstetrics negative OB ROS                            Anesthesia Physical Anesthesia Plan  ASA: III  Anesthesia Plan: General   Post-op Pain Management:    Induction: Intravenous  PONV Risk Score and Plan: 0 and Treatment may vary due to age or medical condition  Airway Management Planned: Mask  Additional Equipment:   Intra-op Plan:   Post-operative Plan:   Informed Consent: I have reviewed the patients History and Physical, chart, labs and discussed the procedure including the risks, benefits and alternatives for the proposed anesthesia with the patient or authorized representative who has indicated his/her understanding and acceptance.   Dental advisory given  Plan Discussed with: CRNA and Surgeon  Anesthesia Plan Comments:         Anesthesia Quick Evaluation

## 2017-09-23 NOTE — CV Procedure (Signed)
    Transesophageal Echocardiogram Note  [name] [id number] June 02, 1950  Procedure: Transesophageal Echocardiogram Indications: Atrial flutter   Procedure Details Consent: Obtained Time Out: Verified patient identification, verified procedure, site/side was marked, verified correct patient position, special equipment/implants available, Radiology Safety Procedures followed,  medications/allergies/relevent history reviewed, required imaging and test results available.  Performed  Medications:  Pt sedated by anesthesia with lidocaine 40 mg and diprovan 70 mg total IV.  Moderate LV dysfunction; s/p MV repair with mild MR; no LAA thrombus.    Complications: No apparent complications Patient did tolerate procedure well.  Kirk Ruths, MD

## 2017-09-23 NOTE — Progress Notes (Signed)
  Echocardiogram Echocardiogram Transesophageal has been performed.  Jannett Celestine 09/23/2017, 10:42 AM

## 2017-09-23 NOTE — H&P (Signed)
Office Visit   09/10/2017 [facility] Maryan Char, NP  Cardiology   Atrial flutter, unspecified type ([place]) +4 more  Dx   Fatigue , Palpitations ; Referred by Ann Held, DO  Reason for Visit   Additional Documentation   Vitals:   BP 140/110    Pulse 129    Ht 5' 10"  (1.778 m)   Wt 237 lb (107.5 kg)   BMI 34.01 kg/m   BSA 2.3 m   Flowsheets:   MEWS Score,   Anthropometrics     Encounter Info:   Billing Info,   History,   Allergies,   Detailed Report     All Notes   Procedures by Lendon Colonel, NP at 09/10/2017 11:00 AM  Author: Lendon Colonel, NP Author Type: Nurse Practitioner Filed: 09/11/2017 9:58 AM  Note Status: Signed Cosign: Cosign Not Required Encounter Date: 09/10/2017  Editor: Lucilla Lame B  Procedure Orders:  1. EKG 12-Lead [[id number]] ordered by Lendon Colonel, NP at 09/10/17 1204         Scan on 09/11/2017 9:58 AM by Lucilla Lame B: [place]  Addendum Note by Lendon Colonel, NP at 09/10/2017 11:00 AM  Author: Lendon Colonel, NP Author Type: Nurse Practitioner Filed: 09/11/2017 6:52 AM  Note Status: Signed Cosign: Cosign Not Required Encounter Date: 09/10/2017  Editor: Lendon Colonel, NP (Nurse Practitioner)    Addended by: Lendon Colonel on: 09/11/2017 06:52 AM   Modules accepted: SmartSet     Progress Notes by Lendon Colonel, NP at 09/10/2017 11:00 AM  Author: Lendon Colonel, NP Author Type: Nurse Practitioner Filed: 09/11/2017 6:52 AM  Note Status: Addendum Cosign: Cosign Not Required Encounter Date: 09/10/2017  Editor: Lendon Colonel, NP (Nurse Practitioner)  Prior Versions: 1. Lendon Colonel, NP (Nurse Practitioner) at 09/10/2017 4:21 PM - Signed  Expand All Collapse All   Cardiology Office Note   Date:  09/10/2017   ID:  JOTHAM AHN, DOB [birth year], MRN [id number]  PCP:  Carollee Herter, Alferd Apa, DO   Cardiologist:  [facility]       Chief Complaint  Patient presents with  . Fatigue  . Palpitations     History of Present Illness: [name] is a 67 y.o. male who presents for ongoing assessment and management of severe mitral regurgitation, preoperative catheterization revealed a 40% left main 30% LAD and 30% right coronary artery. He has also had atrial flutter ablation in March 2009.  The patient was last seen by Dr. Stanford Breed on 02/13/2017.    The patient to be continued on SBE prophylaxis in the setting of mitral valve repair.  On last visit with Dr. Stanford Breed a echocardiogram was ordered for ongoing management and surveillance of mitral valve and LV function.  Echocardiogram dated 02/19/2017 revealed normal LV systolic function with an EF of 76%, grade 2 diastolic dysfunction, mild stenosis of the mitral valve with mild to moderate regurgitation directed eccentrically.  Left atrium was moderately dilated.  The patient was most recently seen by her primary care provider for physical.  The patient was noted to be followed by the lipid clinic for [place].  He states that he has been feeling tired, mildly short of breath, and feeling palpitations.  Sometimes he states he cannot feel his pulse at all.  This is causing him some anxiety.  He denies associated chest pain, dizziness, or near syncope.  Past Medical History:  Diagnosis Date  . Atrial flutter ([place])    s/p ablation  . CAD (coronary artery disease)    cath 06/2007 40% LM  . CVD (cerebrovascular disease)    59% right ICA, 49% left ICA; h/oTIA; fu study 8/11 with normal carotids  . Dysrhythmia   . Gait abnormality   . Heart murmur   . History of bronchitis   . History of colonoscopy   . Horseshoe kidney   . Hyperlipidemia   . MR (mitral regurgitation)    severe; mitral valve repair in march 2009  . MVP (mitral valve prolapse)   . Osteoarthritis    both knees  . Shortness of breath dyspnea    increased exertion           Past Surgical History:  Procedure Laterality Date  . BACK SURGERY     secondary to ruptured disc  . CARDIAC CATHETERIZATION    . heart ablation    . MITRAL VALVE REPAIR  06/2007  . TONSILLECTOMY  1960  . TOTAL KNEE ARTHROPLASTY Bilateral 03/09/2015   Procedure: TOTAL KNEE BILATERAL;  Surgeon: Gaynelle Arabian, MD;  Location: [facility];  Service: Orthopedics;  Laterality: Bilateral;           Current Outpatient Medications  Medication Sig Dispense Refill  . amoxicillin (AMOXIL) 500 MG capsule Take 500 mg by mouth as directed. Before dental work    . ANTARA 90 MG CAPS TAKE 1 CAPSULE BY MOUTH EVERY DAY 90 capsule 1  . aspirin 81 MG tablet Take 81 mg by mouth daily.    . Evolocumab (REPATHA SURECLICK) 938 MG/ML SOAJ Inject 140 mg into the skin every 14 (fourteen) days. 6 pen 4  . Icosapent Ethyl 1 g CAPS Take 2 g by mouth 2 (two) times daily. 120 capsule 5  . meloxicam (MOBIC) 15 MG tablet TAKE 1 TABLET BY MOUTH EVERY DAY 90 tablet 1  . apixaban (ELIQUIS) 5 MG TABS tablet Take 1 tablet (5 mg total) by mouth 2 (two) times daily. 60 tablet 3  . metoprolol tartrate (LOPRESSOR) 25 MG tablet Take 1 tablet (25 mg total) by mouth 2 (two) times daily. 60 tablet 3   No current facility-administered medications for this visit.     Allergies:   Statins; Codeine; and Tape    Social History:  The patient  reports that he has never smoked. He has never used smokeless tobacco. He reports that he drinks alcohol. He reports that he does not use drugs.   Family History:  The patient's family history includes Colitis in his daughter; Crohn's disease in his daughter; Diabetes in his unknown relative; Hyperlipidemia in his father; Liver cancer in his father; Liver disease in his father.    ROS: All other systems are reviewed and negative. Unless otherwise mentioned in H&P    PHYSICAL EXAM: VS:  BP (!) 140/110   Pulse (!) 129   Ht 5' 10"  (1.778 m)   Wt 237 lb (107.5  kg)   BMI 34.01 kg/m  , BMI Body mass index is 34.01 kg/m. GEN: Well nourished, well developed, in no acute distress  HEENT: normal  Neck: no JVD, carotid bruits, or masses Cardiac: RRR, tachycardic; no murmurs, rubs, or gallops,non-pitting  edema  Respiratory:  Clear to auscultation bilaterally, normal work of breathing GI: soft, nontender, nondistended, + BS, obese MS: no deformity or atrophy  Skin: warm and dry, no rash Neuro:  Strength and sensation are intact Psych: euthymic mood,  full affect   EKG: Atrial flutter, heart rate of 129 bpm, nonspecific ST and T wave abnormalities noted.  Motion artifact is seen.  Recent Labs: 01/18/2017: Hemoglobin 15.2; Platelets 177.0; TSH 1.44 07/15/2017: ALT 67; ALT 67; BUN 19; Creatinine, Ser 1.03; Potassium 4.3; Sodium 140    Lipid Panel Labs (Brief)          Component Value Date/Time   CHOL 129 07/15/2017 0855   TRIG 90.0 07/15/2017 0855   HDL 50.50 07/15/2017 0855   CHOLHDL 3 07/15/2017 0855   VLDL 18.0 07/15/2017 0855   LDLCALC 61 07/15/2017 0855   LDLDIRECT 147.0 01/16/2016 0908           Wt Readings from Last 3 Encounters:  09/10/17 237 lb (107.5 kg)  07/15/17 235 lb 9.6 oz (106.9 kg)  02/13/17 237 lb 6.4 oz (107.7 kg)    Other studies Reviewed: Echocardiogram 02/24/17  Left ventricle: The cavity size was normal. Wall thickness was normal. Systolic function was normal. The estimated ejection fraction was 55%. Wall motion was normal; there were no regional wall motion abnormalities. Features are consistent with a pseudonormal left ventricular filling pattern, with concomitant abnormal relaxation and increased filling pressure (grade 2 diastolic dysfunction). - Aortic valve: There was mild regurgitation. - Mitral valve: Noncalcified, moderately thickened annulus. Mildly thickened leaflets . Thickening. The findings are consistent with mild stenosis. There was mild to moderate  regurgitation directed eccentrically. Valve area by pressure half-time: 1.85 cm^2. Valve area by continuity equation (using LVOT flow): 1.8 cm^2. following mitral valve repair. - Left atrium: The atrium was moderately dilated. - Dilated IVC with variation.   ASSESSMENT AND PLAN:  1.  Recurrent atrial flutter: Heart rate is elevated at 126 bpm.  The patient states that he feels this sometimes and on occasion he feels his heart rate normalized on its own.  Likely paroxysmal atrial flutter although there is no documentation of flutter/normal sinus rhythm episodes.  Today his EKG confirms atrial flutter with high ventricular response.  He has 2:1 AV block.  I will begin the patient on Eliquis 5 mg twice daily metoprolol 25 mg twice daily.  I have spoken with Dr. Stanford Breed, the patient's primary cardiologist, because this patient's case with him.  Dr. Stanford Breed has reviewed the notes.  It is recommended that the patient be set up for a TEE cardioversion in 2 weeks versus a plain cardioversion in 1 month depending upon timing.  I have explained the risk and benefits of the procedure. He verbalizes understanding and is willing to proceed.   I have discussed this with the patient and his wife who would like to have this taken care of sooner than later and therefore have elected to be scheduled for TEE cardioversion.  The patient is scheduled for Sep 23, 2017 with Dr. Stanford Breed at 10 AM.  In the interim the patient will continue new therapy with beta-blocker and anticoagulation therapy.    I have discussed the risks and benefits of anticoagulation therapy and they are willing to take the medications.  The patient will come back in 1 week for a EKG to evaluate his response to medication and heart rate.  He may convert to normal sinus rhythm on p.o. metoprolol.  Labs will also be ordered in anticipation of TEE.  2.  Hypertension: Blood pressure is not adequately controlled today.  Addition of  metoprolol may help to remedy this.  He will continue his other medications as directed.  He is on meloxicam 15  mg but he does not take it daily.  3.  Status post mitral valve repair: The patient had mitral valve repair in 2008 with atrial flutter ablation occurred post surgical repair.  He is not been on any anticoagulation for several years now.  He is on SBE prophylaxis however.   Current medicines are reviewed at length with the patient today.    Labs/ tests ordered today include: Transesophageal echocardiogram pre- procedure labs.  I have spent greater than 45 minutes with this patient and his wife explaining plan, medication management, and scheduling appointment.   Phill Myron. West Pugh, ANP, AACC     For TEE/DCCV; no changes. Kirk Ruths, MD

## 2017-09-24 ENCOUNTER — Encounter (HOSPITAL_COMMUNITY): Payer: Self-pay | Admitting: Cardiology

## 2017-10-02 ENCOUNTER — Encounter: Payer: Self-pay | Admitting: Adult Health

## 2017-10-02 ENCOUNTER — Ambulatory Visit: Payer: 59 | Admitting: Adult Health

## 2017-10-02 VITALS — BP 148/84 | HR 71 | Ht 70.0 in | Wt 235.0 lb

## 2017-10-02 DIAGNOSIS — E7800 Pure hypercholesterolemia, unspecified: Secondary | ICD-10-CM

## 2017-10-02 DIAGNOSIS — I483 Typical atrial flutter: Secondary | ICD-10-CM | POA: Diagnosis not present

## 2017-10-02 DIAGNOSIS — E785 Hyperlipidemia, unspecified: Secondary | ICD-10-CM

## 2017-10-02 DIAGNOSIS — Z79899 Other long term (current) drug therapy: Secondary | ICD-10-CM

## 2017-10-02 DIAGNOSIS — Z9889 Other specified postprocedural states: Secondary | ICD-10-CM

## 2017-10-02 DIAGNOSIS — I10 Essential (primary) hypertension: Secondary | ICD-10-CM

## 2017-10-02 MED ORDER — METOPROLOL TARTRATE 25 MG PO TABS: 25.0000 mg | ORAL_TABLET | Freq: Two times a day (BID) | ORAL | 3 refills | Status: DC

## 2017-10-02 MED ORDER — LISINOPRIL 2.5 MG PO TABS: 2.5000 mg | ORAL_TABLET | Freq: Every day | ORAL | 3 refills | Status: DC

## 2017-10-02 MED ORDER — APIXABAN 5 MG PO TABS: 5.0000 mg | ORAL_TABLET | Freq: Two times a day (BID) | ORAL | 3 refills | Status: DC

## 2017-10-02 NOTE — Patient Instructions (Signed)
Medication Instructions:  START LISINOPRIL 2.5MG DAILY If you need a refill on your cardiac medications before your next appointment, please call your pharmacy.  Labwork: FASTING LIPIDS, AND LFT HERE IN OUR OFFICE AT [facility]  Take the provided lab slips with you to the lab for your blood draw.   You will need to fast. DO NOT EAT OR DRINK PAST MIDNIGHT.   Special Instructions: BLOOD PRESSURE CHECK WITH PHARMACIST  Follow-Up: Your physician wants you to follow-up in: [place] should receive a reminder letter in the mail two months in advance. If you do not receive a letter, please call our office SEPT 2019 to schedule the NOV 2019 follow-up appointment.   Thank you for choosing [facility] at [facility]!!

## 2017-10-02 NOTE — Progress Notes (Signed)
Cardiology Office Note   Date:  10/02/2017   ID:  [name], DOB September 01, 1950, MRN [id number]  PCP:  Carollee Herter, Alferd Apa, DO  Cardiologist: Dr. Stanford Breed  Chief Complaint  Patient presents with  . Follow-up    discuss medications,pt does mention leg cramps at night, blood pressure has been higher, 90 day supply of medications, pt denies chest pains, swelling in hands/feet, some SOB noted     History of Present Illness: [name] is a 67 y.o. male who presents for postprocedure follow-up after being admitted for TEE/DCC cardioversion by Dr. Stanford Breed which was completed on 09/23/2017 in the setting of atrial flutter.  The patient had been on Eliquis 5 mg twice daily and metoprolol twice daily.  Other history includes coronary artery disease, with known history of severe mitral regurgitation, status post mitral valve repair, cardiac catheterization revealing 40% left main, 30% LAD, 30% right coronary artery.  Echocardiogram on 09/23/2017 revealed an ejection fraction 35% to 40% with diffuse hypokinesis, aortic valve did not reveal any evidence of vegetation, mitral valve revealed prior surgical repair, and findings consistent with mild stenosis with mild regurg.  The atrium is severely dilated without evidence of thrombus in the atrial cavity or appendage.  He is doing well and is without complaints today. He feels better. He is tolerating his medication without bleeding, DOE, or fatigue. He denies dizziness or recurrent rapid HR. He brings with him a list of HR and BP's from home. He is running in the 676'P and 950'D systolic with HR in the 32'I.   Past Medical History:  Diagnosis Date  . Atrial flutter ([place])    s/p ablation  . CAD (coronary artery disease)    cath 06/2007 40% LM  . CVD (cerebrovascular disease)    59% right ICA, 49% left ICA; h/oTIA; fu study 8/11 with normal carotids  . Dysrhythmia   . Gait abnormality   . Heart murmur   . History of bronchitis   . History of  colonoscopy   . Horseshoe kidney   . Hyperlipidemia   . MR (mitral regurgitation)    severe; mitral valve repair in march 2009  . MVP (mitral valve prolapse)   . Osteoarthritis    both knees  . Shortness of breath dyspnea    increased exertion     Past Surgical History:  Procedure Laterality Date  . BACK SURGERY     secondary to ruptured disc  . CARDIAC CATHETERIZATION    . CARDIOVERSION N/A 09/23/2017   Procedure: CARDIOVERSION;  Surgeon: Lelon Perla, MD;  [facility];  Service: Cardiovascular;  Laterality: N/A;  . heart ablation    . MITRAL VALVE REPAIR  06/2007  . TEE WITHOUT CARDIOVERSION N/A 09/23/2017   Procedure: TRANSESOPHAGEAL ECHOCARDIOGRAM (TEE);  Surgeon: Lelon Perla, MD;  Location: [place];  Service: Cardiovascular;  Laterality: N/A;  . TONSILLECTOMY  1960  . TOTAL KNEE ARTHROPLASTY Bilateral 03/09/2015   Procedure: TOTAL KNEE BILATERAL;  Surgeon: Gaynelle Arabian, MD;  Location: [facility];  Service: Orthopedics;  Laterality: Bilateral;     Current Outpatient Medications  Medication Sig Dispense Refill  . amoxicillin (AMOXIL) 500 MG capsule Take 2,000 mg by mouth See admin instructions. Take 2000 mg by mouth 1 hour dental procedure    . ANTARA 90 MG CAPS TAKE 1 CAPSULE BY MOUTH EVERY DAY 90 capsule 1  . apixaban (ELIQUIS) 5 MG TABS tablet Take 1 tablet (5 mg total) by mouth 2 (two) times  daily. 60 tablet 3  . aspirin 81 MG tablet Take 81 mg by mouth daily.    . Evolocumab (REPATHA SURECLICK) 888 MG/ML SOAJ Inject 140 mg into the skin every 14 (fourteen) days. 6 pen 4  . Icosapent Ethyl 1 g CAPS Take 2 g by mouth 2 (two) times daily. 120 capsule 5  . meloxicam (MOBIC) 15 MG tablet TAKE 1 TABLET BY MOUTH EVERY DAY 90 tablet 1  . metoprolol tartrate (LOPRESSOR) 25 MG tablet Take 1 tablet (25 mg total) by mouth 2 (two) times daily. 60 tablet 3   No current facility-administered medications for this visit.     Allergies:   Statins; Codeine; and Tape     Social History:  The patient  reports that he has never smoked. He has never used smokeless tobacco. He reports that he drinks alcohol. He reports that he does not use drugs.   Family History:  The patient's family history includes Colitis in his daughter; Crohn's disease in his daughter; Diabetes in his unknown relative; Hyperlipidemia in his father; Liver cancer in his father; Liver disease in his father.    ROS: All other systems are reviewed and negative. Unless otherwise mentioned in H&P    PHYSICAL EXAM: VS:  BP (!) 148/84 (BP Location: Left Arm)   Pulse 71   Ht 5' 10"  (1.778 m)   Wt 235 lb (106.6 kg)   BMI 33.72 kg/m  , BMI Body mass index is 33.72 kg/m. GEN: Well nourished, well developed, in no acute distress  HEENT: normal  Neck: no JVD, carotid bruits, or masses Cardiac: RRR; no murmurs, rubs, or gallops,no edema  Respiratory:  Clear to auscultation bilaterally, normal work of breathing GI: soft, nontender, nondistended, + BS MS: no deformity or atrophy  Skin: warm and dry, no rash Neuro:  Strength and sensation are intact Psych: euthymic mood, full affect   EKG:  Not completed this office visit.    Recent Labs: 01/18/2017: TSH 1.44 07/15/2017: ALT 67; ALT 67 09/17/2017: BUN 16; Creatinine, Ser 1.07; Hemoglobin 15.5; Platelets 187; Potassium 4.3; Sodium 141    Lipid Panel    Component Value Date/Time   CHOL 129 07/15/2017 0855   TRIG 90.0 07/15/2017 0855   HDL 50.50 07/15/2017 0855   CHOLHDL 3 07/15/2017 0855   VLDL 18.0 07/15/2017 0855   LDLCALC 61 07/15/2017 0855   LDLDIRECT 147.0 01/16/2016 0908      Wt Readings from Last 3 Encounters:  10/02/17 235 lb (106.6 kg)  2017-10-10 237 lb (107.5 kg)  09/10/17 237 lb (107.5 kg)      Other studies Reviewed: Echocardiogram 10/10/2017 Left ventricle: Systolic function was moderately reduced. The   estimated ejection fraction was in the range of 35% to 40%.   Diffuse hypokinesis. - Aortic valve: No  evidence of vegetation. There was trivial   regurgitation. - Aortic root: The aortic root was mildly dilated. - Mitral valve: Prior procedures included surgical repair. The   findings are consistent with mild stenosis. There was mild   regurgitation. - Left atrium: The atrium was severely dilated. No evidence of   thrombus in the atrial cavity or appendage. - Atrial septum: No defect or patent foramen ovale was identified.   There was an atrial septal aneurysm. - Tricuspid valve: No evidence of vegetation. - Pulmonic valve: No evidence of vegetation.  Impressions:  - Moderate global reduction in LV systolic function; mildly dilated   aortic root; trace AI; s/p MV repair with mild MS (  mean gradient   5 mmHg) and mild MR; severe LAE; mild TR.  ASSESSMENT AND PLAN:  1. Atrial flutter: S/P TEE DCCV. Remains in regular rhythm without symptoms ore recurrence of rapid HR. He will continue metoprolol 25 mg BID and Eliquis as directed. He received 90 day supply with refills.   2. Hypercholesterolemia: The patient is followed by pharmacy for PCSK9 inhibition therapy on Repatha. Will repeat L/L as he has not been checked since 06/2017.   3. Hypertension: Will add ACE inhibitor lisinopril 2.5 mg daily. He has reduced EF while in atrial flutter, of 35%-40%. Will need cardiorenal protection. He will follow up with pharmacy for BP check when he sees them for Repatha follow up. Will have BMET drawn with L/L to be reviewed. He is given 30 day supply of lisinopril. He is to keep log. If BP is better controlled and labs are okay, will provide 90 day supply.   4.S/P Mitral Valve Repair: He will have repeat echo on next office visit.   Current medicines are reviewed at length with the patient today.    Labs/ tests ordered today include: BMET, L/L  Phill Myron. West Pugh, ANP, AACC   10/02/2017 10:58 AM    [facility] [address] [place], [place] [address] Phone: [phone]; Fax: [phone]

## 2017-10-17 ENCOUNTER — Ambulatory Visit (INDEPENDENT_AMBULATORY_CARE_PROVIDER_SITE_OTHER): Payer: 59 | Admitting: Pharmacist

## 2017-10-17 DIAGNOSIS — I10 Essential (primary) hypertension: Secondary | ICD-10-CM | POA: Diagnosis not present

## 2017-10-17 LAB — BASIC METABOLIC PANEL
BUN / CREAT RATIO: 16 (ref 10–24)
BUN: 19 mg/dL (ref 8–27)
CO2: 22 mmol/L (ref 20–29)
CREATININE: 1.17 mg/dL (ref 0.76–1.27)
Calcium: 9.6 mg/dL (ref 8.6–10.2)
Chloride: 101 mmol/L (ref 96–106)
GFR, EST AFRICAN AMERICAN: 76 mL/min/{1.73_m2} (ref >59)
GFR, EST NON AFRICAN AMERICAN: 65 mL/min/{1.73_m2} (ref >59)
GLUCOSE: 111 mg/dL — AB (ref 65–99)
Potassium: 4.5 mmol/L (ref 3.5–5.2)
SODIUM: 140 mmol/L (ref 134–144)

## 2017-10-17 LAB — HEPATIC FUNCTION PANEL
ALK PHOS: 47 IU/L (ref 39–117)
ALT: 69 IU/L — ABNORMAL HIGH (ref 0–44)
AST: 33 IU/L (ref 0–40)
Albumin: 4.6 g/dL (ref 3.6–4.8)
BILIRUBIN TOTAL: 0.9 mg/dL (ref 0.0–1.2)
BILIRUBIN, DIRECT: 0.24 mg/dL (ref 0.00–0.40)
TOTAL PROTEIN: 6.6 g/dL (ref 6.0–8.5)

## 2017-10-17 LAB — LIPID PANEL
CHOL/HDL RATIO: 2.1 ratio (ref 0.0–5.0)
Cholesterol, Total: 103 mg/dL (ref 100–199)
HDL: 48 mg/dL (ref >39)
LDL Calculated: 41 mg/dL (ref 0–99)
Triglycerides: 71 mg/dL (ref 0–149)
VLDL Cholesterol Cal: 14 mg/dL (ref 5–40)

## 2017-10-17 NOTE — Patient Instructions (Signed)
Return for a  follow up appointment as needed  Check your blood pressure at home daily (if able) and keep record of the readings.  Take your BP meds as follows: *NO MEDICATION CHANGE NEEDED* *BLOOD work today*  Bring all of your meds, your BP cuff and your record of home blood pressures to your next appointment.  Exercise as you're able, try to walk approximately 30 minutes per day.  Keep salt intake to a minimum, especially watch canned and prepared boxed foods.  Eat more fresh fruits and vegetables and fewer canned items.  Avoid eating in fast food restaurants.    HOW TO TAKE YOUR BLOOD PRESSURE: . Rest 5 minutes before taking your blood pressure. .  Don't smoke or drink caffeinated beverages for at least 30 minutes before. . Take your blood pressure before (not after) you eat. . Sit comfortably with your back supported and both feet on the floor (don't cross your legs). . Elevate your arm to heart level on a table or a desk. . Use the proper sized cuff. It should fit smoothly and snugly around your bare upper arm. There should be enough room to slip a fingertip under the cuff. The bottom edge of the cuff should be 1 inch above the crease of the elbow. . Ideally, take 3 measurements at one sitting and record the average.

## 2017-10-17 NOTE — Progress Notes (Signed)
Patient ID: [name]                 DOB: 04/07/1951                      MRN: [id number]     HPI: [name] is a 67 y.o. male patient of Dr Stanford Breed referred by K. Lawrence to HTN clinic.  PMH includes atrial fibrillation, CAD, CVD, heart murmur, mitral valve repair, and osteoarthritis. Lisinopril 2.2m daily was initiated during his most recent office visit to improve BP control.  Patient also reports cutting down on coffee, and trying to increase his physical activity.  He presents to clinic accompany by his wife and denies dizziness, swelling, headaches, chest pain, or problems with current therapy.  Current HTN meds:  Metoprolol tartrate 260mtwice daily Lisinopril 2.71m271maily  BP goal: 130/80  Family History: The patient's family history includes Colitis in his daughter; Crohn's disease in his daughter; Diabetes in his unknown relative; Hyperlipidemia in his father; Liver cancer in his father; Liver disease in his father.  Social History:  The patient  reports that he has never smoked. He has never used smokeless tobacco. He reports that he drinks alcohol. He reports that he does not use drugs.  Diet: Most changes in last 3-4 weeks.Decreased red meat, fried foods, pasta and bacon intake. Mostly home cooked meals, eating more yogurt, fruits, chicken and vegetables.   Exercise: activities of daily living; trying to walk 2-3 time per week.  Home BP readings:  14 readings; average 128/78 (HR 46-109 bpm)  Wt Readings from Last 3 Encounters:  10/02/17 235 lb (106.6 kg)  09/23/17 237 lb (107.5 kg)  09/10/17 237 lb (107.5 kg)   BP Readings from Last 3 Encounters:  10/17/17 124/72  10/02/17 (!) 148/84  09/23/17 130/85   Pulse Readings from Last 3 Encounters:  10/17/17 64  10/02/17 71  09/23/17 86    Past Medical History:  Diagnosis Date  . Atrial flutter ([place]  s/p ablation  . CAD (coronary artery disease)    cath 06/2007 40% LM  . CVD (cerebrovascular disease)     59% right ICA, 49% left ICA; h/oTIA; fu study 8/11 with normal carotids  . Dysrhythmia   . Gait abnormality   . Heart murmur   . History of bronchitis   . History of colonoscopy   . Horseshoe kidney   . Hyperlipidemia   . MR (mitral regurgitation)    severe; mitral valve repair in march 2009  . MVP (mitral valve prolapse)   . Osteoarthritis    both knees  . Shortness of breath dyspnea    increased exertion     Current Outpatient Medications on File Prior to Visit  Medication Sig Dispense Refill  . amoxicillin (AMOXIL) 500 MG capsule Take 2,000 mg by mouth See admin instructions. Take 2000 mg by mouth 1 hour dental procedure    . ANTARA 90 MG CAPS TAKE 1 CAPSULE BY MOUTH EVERY DAY 90 capsule 1  . apixaban (ELIQUIS) 5 MG TABS tablet Take 1 tablet (5 mg total) by mouth 2 (two) times daily. 180 tablet 3  . aspirin 81 MG tablet Take 81 mg by mouth daily.    . Evolocumab (REPATHA SURECLICK) 140882/ML SOAJ Inject 140 mg into the skin every 14 (fourteen) days. 6 pen 4  . Icosapent Ethyl 1 g CAPS Take 2 g by mouth 2 (two) times daily. 120 capsule 5  .  lisinopril (PRINIVIL,ZESTRIL) 2.5 MG tablet Take 1 tablet (2.5 mg total) by mouth daily. 30 tablet 3  . meloxicam (MOBIC) 15 MG tablet TAKE 1 TABLET BY MOUTH EVERY DAY 90 tablet 1  . metoprolol tartrate (LOPRESSOR) 25 MG tablet Take 1 tablet (25 mg total) by mouth 2 (two) times daily. 180 tablet 3   No current facility-administered medications on file prior to visit.     Allergies  Allergen Reactions  . Statins Other (See Comments)    Messes with liver function  . Codeine Nausea Only  . Tape Rash    Blood pressure 124/72, pulse 64, SpO2 96 %.  Hypertension Blood pressure is at desired goal of <130/80. Patient is currently working on positive lifestyle modifications and report compliance with therapy. Will continue current medication therapy without changes and follow up with HTN clinic as needed.    Martyna Thorns Rodriguez-Guzman  PharmD, BCPS, [place] [address] [place] [address] 10/20/2017 6:47 PM

## 2017-10-20 ENCOUNTER — Encounter: Payer: Self-pay | Admitting: Pharmacist

## 2017-10-20 NOTE — Assessment & Plan Note (Addendum)
Blood pressure is at desired goal of <130/80. Patient is currently working on positive lifestyle modifications and report compliance with therapy. Will continue current medication therapy without changes and follow up with HTN clinic as needed.

## 2017-10-25 NOTE — Telephone Encounter (Signed)
Unable to reach pt or leave a message  

## 2017-10-25 NOTE — Telephone Encounter (Signed)
Called patient to discuss lab results and he has a bunch of questions and wanted to speak with Hilda Blades or Dr Stanford Breed.  He wanted to know why his liver enzymes were elevated and what could he do besides diet and exercise. He also wanted to know why every time he calls his pharmacy to request a refill on his Repatha the pharmacist asks him questions like is the medication working and things of that nature.  Informed him that I would send his concerns to Hilda Blades and have her give him a call. He voiced understanding.

## 2017-10-25 NOTE — Telephone Encounter (Signed)
Left message for pt to call.

## 2017-11-05 NOTE — Telephone Encounter (Signed)
This encounter was created in error - please disregard.

## 2017-12-10 ENCOUNTER — Encounter: Payer: Self-pay | Admitting: Cardiology

## 2017-12-10 ENCOUNTER — Telehealth: Payer: Self-pay | Admitting: Pharmacist Clinician (PhC)/ Clinical Pharmacy Specialist

## 2017-12-10 NOTE — Telephone Encounter (Signed)
See telephone encounter for today

## 2017-12-10 NOTE — Telephone Encounter (Signed)
Patient called this am, concerned about elevated blood pressures and heart rate.   See My Chart note from today for specific readings.  Concern is that his pressure has been elevated to 160/100 range, but then systolic will drop to 239-532 with diastolic staying elevated.  Also has noted his heart rate, which is normally 48-55 will spike up to 150.  He can feel his heart racing when this occurs.   He also notes that he feels lethargic (this he blames on Repatha) and has recently noted a dry cough.  No shortness of breath, except occasionally with exertion.    Patient had TEE/DCCV on May 6, was feeling well until about 1-2 weeks ago.  His My Chart note, as well as Raquel's hypertension note, indicate that he is trying to modify lifestyle to help.    Reviewed information with DOD Dr. Debara Pickett.  Will need to consider that the lisinopril is cause of cough, but I didn't want to increase the metoprolol for heart rate when his baseline is around 50.   He agreed that patient should be seen soon and potentially have a monitor placed to see exactly what is occurring.     Spoke again with patient, next available PA opening is Thursday with Fabian Sharp.  Patient was concerned, he wants to see Jory Sims or Dr. Stanford Breed.  Explained that neither of them have openings for several weeks, and that Levada Dy would be able to see all the pertinent chart information.  Patient agreeable to coming Thursday.

## 2017-12-11 NOTE — Progress Notes (Signed)
Cardiology Office Note:    Date:  12/12/2017   ID:  [name], DOB 1951-03-21, MRN [id number]  PCP:  Carollee Herter, Alferd Apa, DO  Cardiologist:  Kirk Ruths, MD   Referring MD: Carollee Herter, Alferd Apa, *   Chief Complaint  Patient presents with  . office visit    blood pressure elevated, HR elevated at times    History of Present Illness:    [name] is a 67 y.o. male with a hx of PAD with heart cath showing 40% left main, 30% LAD, 30% RCA and 2008.  He requires SBE prophylaxis.  He is also status post mitral valve repair for severe mitral regurgitation in 2008.  He has a history of atrial flutter post ablation in March 2009.  Patient was seen recently 08/2017.  At that time he was in atrial flutter with a heart rate of 126 bpm.  He was referred for TEE/DCCV.  He was successfully cardioverted he has been maintained on Eliquis and Lopressor.  Of note, TEE revealed LVEF of 35 to 40%.  This is a reduction from prior echo in 2018, and likely related to his RVR.   He returns today for follow-up. He has been working with HTN clinic pharmacists for his BP. He is currently on lisinopril 2.5 mg and lopressor 25 mg BID. He is doing well on eliquis without bleeding problems. He is on repatha, icosapent, and fenofibrate for cholesterol.   He is here with his wife and started feeling palpitations, racing heart rate, and fatigue over the past several days. EKG with atrial flutter 103. His BP and HR log shows heart rates in the 150s starting 12/10/17. He has also recorded heart rates in the 50s during the same time period. I am concerned about tachy-brady syndrome. He is highly anxious and we discussed when and how many times to take his BP and HR during a day. I do not want to increase his lopressor given heart rates in the 50s. We discussed possible PRN use of 12.5 mg lopressor.  I will hold off on an event monitor at this time and will defer to EP. Given his age, CAD, and CHF, his antiarrhythmic  selection is limited.   He is euvolemic on exam He will need a follow-up echo in approximately 1 month. This may be pushed out since he has since converted back to flutter since DCCV and is experiencing RVR.  He reports a dry cough, which may be related to his lisinopril.    Past Medical History:  Diagnosis Date  . Atrial flutter ([place])    s/p ablation  . CAD (coronary artery disease)    cath 06/2007 40% LM  . CVD (cerebrovascular disease)    59% right ICA, 49% left ICA; h/oTIA; fu study 8/11 with normal carotids  . Dysrhythmia   . Gait abnormality   . Heart murmur   . History of bronchitis   . History of colonoscopy   . Horseshoe kidney   . Hyperlipidemia   . MR (mitral regurgitation)    severe; mitral valve repair in march 2009  . MVP (mitral valve prolapse)   . Osteoarthritis    both knees  . Shortness of breath dyspnea    increased exertion     Past Surgical History:  Procedure Laterality Date  . BACK SURGERY     secondary to ruptured disc  . CARDIAC CATHETERIZATION    . CARDIOVERSION N/A 09/23/2017   Procedure: CARDIOVERSION;  Surgeon:  Lelon Perla, MD;  Location: [facility] ENDOSCOPY;  Service: Cardiovascular;  Laterality: N/A;  . heart ablation    . MITRAL VALVE REPAIR  06/2007  . TEE WITHOUT CARDIOVERSION N/A 09/23/2017   Procedure: TRANSESOPHAGEAL ECHOCARDIOGRAM (TEE);  Surgeon: Lelon Perla, MD;  Location: [facility];  Service: Cardiovascular;  Laterality: N/A;  . TONSILLECTOMY  1960  . TOTAL KNEE ARTHROPLASTY Bilateral 03/09/2015   Procedure: TOTAL KNEE BILATERAL;  Surgeon: Gaynelle Arabian, MD;  Location: [facility];  Service: Orthopedics;  Laterality: Bilateral;    Current Medications: Current Meds  Medication Sig  . amoxicillin (AMOXIL) 500 MG capsule Take 2,000 mg by mouth See admin instructions. Take 2000 mg by mouth 1 hour dental procedure  . ANTARA 90 MG CAPS TAKE 1 CAPSULE BY MOUTH EVERY DAY  . apixaban (ELIQUIS) 5 MG TABS tablet Take 1 tablet (5 mg total)  by mouth 2 (two) times daily.  Marland Kitchen aspirin 81 MG tablet Take 81 mg by mouth daily.  . Evolocumab (REPATHA SURECLICK) 737 MG/ML SOAJ Inject 140 mg into the skin every 14 (fourteen) days.  Vanessa Kick Ethyl 1 g CAPS Take 2 g by mouth 2 (two) times daily.  . meloxicam (MOBIC) 15 MG tablet TAKE 1 TABLET BY MOUTH EVERY DAY (Patient taking differently: TAKE 1 TABLET BY MOUTH PRN)  . metoprolol tartrate (LOPRESSOR) 25 MG tablet Take 1 tablet (25 mg total) by mouth 2 (two) times daily.  . [DISCONTINUED] lisinopril (PRINIVIL,ZESTRIL) 2.5 MG tablet Take 1 tablet (2.5 mg total) by mouth daily.     Allergies:   Statins; Codeine; and Tape   Social History   Socioeconomic History  . Marital status: Married    Spouse name: Not on file  . Number of children: Not on file  . Years of education: Not on file  . Highest education level: Not on file  Occupational History  . Occupation: [employer]: works from home  Social Needs  . Financial resource strain: Not on file  . Food insecurity:    Worry: Not on file    Inability: Not on file  . Transportation needs:    Medical: Not on file    Non-medical: Not on file  Tobacco Use  . Smoking status: Never Smoker  . Smokeless tobacco: Never Used  Substance and Sexual Activity  . Alcohol use: Yes    Alcohol/week: 0.0 oz    Comment: occasional 1-2 wine drinks a month  . Drug use: No  . Sexual activity: Yes    Partners: Female  Lifestyle  . Physical activity:    Days per week: Not on file    Minutes per session: Not on file  . Stress: Not on file  Relationships  . Social connections:    Talks on phone: Not on file    Gets together: Not on file    Attends religious service: Not on file    Active member of club or organization: Not on file    Attends meetings of clubs or organizations: Not on file    Relationship status: Not on file  Other Topics Concern  . Not on file  Social History Narrative   Exercise--  Total gym     Family  History: The patient's family history includes Colitis in his daughter; Crohn's disease in his daughter; Diabetes in his unknown relative; Hyperlipidemia in his father; Liver cancer in his father; Liver disease in his father. There is no history of Colon cancer, Esophageal cancer,  Rectal cancer, or Stomach cancer.  ROS:   Please see the history of present illness.    All other systems reviewed and are negative.  EKGs/Labs/Other Studies Reviewed:    The following studies were reviewed today:  TEE 09/23/17: Study Conclusions - Left ventricle: Systolic function was moderately reduced. The   estimated ejection fraction was in the range of 35% to 40%.   Diffuse hypokinesis. - Aortic valve: No evidence of vegetation. There was trivial   regurgitation. - Aortic root: The aortic root was mildly dilated. - Mitral valve: Prior procedures included surgical repair. The   findings are consistent with mild stenosis. There was mild   regurgitation. - Left atrium: The atrium was severely dilated. No evidence of   thrombus in the atrial cavity or appendage. - Atrial septum: No defect or patent foramen ovale was identified.   There was an atrial septal aneurysm. - Tricuspid valve: No evidence of vegetation. - Pulmonic valve: No evidence of vegetation.  Impressions: - Moderate global reduction in LV systolic function; mildly dilated   aortic root; trace AI; s/p MV repair with mild MS (mean gradient   5 mmHg) and mild MR; severe LAE; mild TR.  EKG:  EKG is ordered today.  The ekg ordered today demonstrates atrial flutter ventricular rate 103.  Recent Labs: 01/18/2017: TSH 1.44 09/17/2017: Hemoglobin 15.5; Platelets 187 10/17/2017: ALT 69; BUN 19; Creatinine, Ser 1.17; Potassium 4.5; Sodium 140  Recent Lipid Panel    Component Value Date/Time   CHOL 103 10/17/2017 0906   TRIG 71 10/17/2017 0906   HDL 48 10/17/2017 0906   CHOLHDL 2.1 10/17/2017 0906   CHOLHDL 3 07/15/2017 0855   VLDL 18.0  07/15/2017 0855   LDLCALC 41 10/17/2017 0906   LDLDIRECT 147.0 01/16/2016 0908    Physical Exam:    VS:  BP 134/74 (BP Location: Left Arm, Patient Position: Sitting)   Pulse (!) 103   Ht 5' 10"  (1.778 m)   Wt 236 lb 9.6 oz (107.3 kg)   BMI 33.95 kg/m     Wt Readings from Last 3 Encounters:  12/12/17 236 lb 9.6 oz (107.3 kg)  10/02/17 235 lb (106.6 kg)  09/23/17 237 lb (107.5 kg)     GEN:  Well nourished, well developed in no acute distress, but very anxious HEENT: Normal NECK: No JVD; No carotid bruits LYMPHATICS: No lymphadenopathy CARDIAC: irregular rhythm, regular rate, no murmur RESPIRATORY:  Clear to auscultation without rales, wheezing or rhonchi  ABDOMEN: Soft, non-tender, non-distended MUSCULOSKELETAL:  No edema; No deformity  SKIN: Warm and dry NEUROLOGIC:  Alert and oriented x 3 PSYCHIATRIC:  Normal affect   ASSESSMENT:    1. Essential hypertension   2. Atrial flutter by electrocardiogram ([place])   3. Chronic systolic heart failure ([place])   4. Atherosclerosis of native coronary artery of native heart without angina pectoris    PLAN:    In order of problems listed above:  Essential hypertension - Plan: CBC, TSH Pt reports dry cough. In consultation with pharmacy, will change lisinopril to 80 mg valsartan. He will continue recording his blood pressure ONE time per day.  Atrial flutter by electrocardiogram ([place]) - Plan: CBC, TSH Pt has unfortunately converted back to Aflutter with RVR. Based on his logs, I suspect his occurred on or near 12/10/17. He is doing well on eliquis without bleeding problems. Given his age, CAD, and CHF, his antiarrhythmic selection is challenging. I am also concerned about possible tachy-brady. I will refer him back  to EP.   Chronic systolic heart failure ([place]) TEE with EF of 35-40%, likely related to his RVR. He will need follow up echo after he is better rate/rhythm controlled. Euvolemic on exam.   Atherosclerosis of native coronary  artery of native heart without angina pectoris Stable.    Follow up with EP.   Medication Adjustments/Labs and Tests Ordered: Current medicines are reviewed at length with the patient today.  Concerns regarding medicines are outlined above.  Orders Placed This Encounter  Procedures  . CBC  . TSH   Meds ordered this encounter  Medications  . valsartan (DIOVAN) 80 MG tablet    Sig: Take 1 tablet (80 mg total) by mouth daily.    Dispense:  90 tablet    Refill:  3    Signed, Ledora Bottcher, [place]  12/12/2017 11:03 AM    [facility]

## 2017-12-12 ENCOUNTER — Ambulatory Visit: Payer: 59 | Admitting: Physician Assistant

## 2017-12-12 ENCOUNTER — Encounter: Payer: Self-pay | Admitting: Physician Assistant

## 2017-12-12 VITALS — BP 134/74 | HR 103 | Ht 70.0 in | Wt 236.6 lb

## 2017-12-12 DIAGNOSIS — I4892 Unspecified atrial flutter: Secondary | ICD-10-CM

## 2017-12-12 DIAGNOSIS — I5022 Chronic systolic (congestive) heart failure: Secondary | ICD-10-CM | POA: Diagnosis not present

## 2017-12-12 DIAGNOSIS — I10 Essential (primary) hypertension: Secondary | ICD-10-CM

## 2017-12-12 DIAGNOSIS — I2510 Atherosclerotic heart disease of native coronary artery without angina pectoris: Secondary | ICD-10-CM | POA: Diagnosis not present

## 2017-12-12 MED ORDER — VALSARTAN 80 MG PO TABS: 80.0000 mg | ORAL_TABLET | Freq: Every day | ORAL | 3 refills | Status: DC

## 2017-12-12 NOTE — Patient Instructions (Addendum)
Medication Instructions:  STOP- Lisinopril START- Valsartan 80 mg daily  If you need a refill on your cardiac medications before your next appointment, please call your pharmacy.  Labwork: CBC and TSH Today HERE IN OUR OFFICE AT [facility]  Take the provided lab slips with you to the lab for your blood draw.   You will NOT need to fast   Testing/Procedures: None Ordered  Follow-Up: Your physician wants you to follow-up with Dr Curt Bears on August 12th at 8:30 am   Keep a blood pressure and heart rate diary and bring at appointment.   Thank you for choosing [facility] at [facility]!!

## 2017-12-13 LAB — CBC
HEMOGLOBIN: 14.5 g/dL (ref 13.0–17.7)
Hematocrit: 45 % (ref 37.5–51.0)
MCH: 29.7 pg (ref 26.6–33.0)
MCHC: 32.2 g/dL (ref 31.5–35.7)
MCV: 92 fL (ref 79–97)
Platelets: 242 10*3/uL (ref 150–450)
RBC: 4.89 x10E6/uL (ref 4.14–5.80)
RDW: 13.2 % (ref 12.3–15.4)
WBC: 6 10*3/uL (ref 3.4–10.8)

## 2017-12-13 LAB — TSH: TSH: 2.04 u[IU]/mL (ref 0.450–4.500)

## 2017-12-16 NOTE — Addendum Note (Signed)
Addended by: Waylan Rocher on: 12/16/2017 03:23 PM   Modules accepted: Orders

## 2017-12-28 ENCOUNTER — Other Ambulatory Visit: Payer: Self-pay | Admitting: Adult Health

## 2017-12-28 NOTE — Progress Notes (Signed)
Electrophysiology Office Note   Date:  12/30/2017   ID:  [name], DOB 06-14-1950, MRN [id number]  PCP:  Carollee Herter, Alferd Apa, DO  Cardiologist:  Stanford Breed Primary Electrophysiologist:  Glennda Weatherholtz Meredith Leeds, MD    No chief complaint on file.    History of Present Illness: [name] is a 67 y.o. male who is being seen today for the evaluation of atrial flutter at the request of Fabian Sharp. Presenting today for electrophysiology evaluation.  Is a history of CAD, atrial flutter status post ablation, mitral regurgitation status post repair in 2009.  He had his atrial flutter ablation in 2009.  He was seen 08/2017 in atrial flutter with heart rates of 126.  He was referred for cardioversion.  He is currently on Eliquis and metoprolol.  Recent TEE showed an EF of 35 to 40% which is a reduction from his prior echo in 2018.  He presented to cardiology clinic 12/12/2017 complaining of palpitations.  EKG showed atrial flutter.  Heart rates have been up to 150 bpm.  He is also had heart rates in the 50s at times.  His main complaints are weakness, fatigue, and shortness of breath.  He also has palpitations and a general uneasy feeling.  He has difficulty doing his daily activities without fatigue.  He also takes quite a few more naps than usual.  Today, he denies symptoms of chest pain, orthopnea, PND, lower extremity edema, claudication, dizziness, presyncope, syncope, bleeding, or neurologic sequela. The patient is tolerating medications without difficulties.    Past Medical History:  Diagnosis Date  . Atrial flutter ([place])    s/p ablation  . CAD (coronary artery disease)    cath 06/2007 40% LM  . CVD (cerebrovascular disease)    59% right ICA, 49% left ICA; h/oTIA; fu study 8/11 with normal carotids  . Dysrhythmia   . Gait abnormality   . Heart murmur   . History of bronchitis   . History of colonoscopy   . Horseshoe kidney   . Hyperlipidemia   . MR (mitral regurgitation)    severe; mitral valve repair in march 2009  . MVP (mitral valve prolapse)   . Osteoarthritis    both knees  . Shortness of breath dyspnea    increased exertion    Past Surgical History:  Procedure Laterality Date  . BACK SURGERY     secondary to ruptured disc  . CARDIAC CATHETERIZATION    . CARDIOVERSION N/A 09/23/2017   Procedure: CARDIOVERSION;  Surgeon: Lelon Perla, MD;  Location[facility] END[facility]COPY;  Service: Cardiovascular;  Laterality: N/A;  . heart ablation    . MITRAL VALVE REPAIR  06/2007  . TEE WITHOUT CARDIOVERSION N/A 09/23/2017   Procedure: TRANSESOPHAGEAL ECHOCARDIOGRAM (TEE);  Surgeon: Lelon Perla, MD;  Location: [facility];  Service: Cardiovascular;  Laterality: N/A;  . TONSILLECTOMY  1960  . TOTAL KNEE ARTHROPLASTY Bilateral 03/09/2015   Procedure: TOTAL KNEE BILATERAL;  Surgeon: Gaynelle Arabian, MD;  Location: [facility];  Service: Orthopedics;  Laterality: Bilateral;     Current Outpatient Medications  Medication Sig Dispense Refill  . amoxicillin (AMOXIL) 500 MG capsule Take 2,000 mg by mouth See admin instructions. Take 2000 mg by mouth 1 hour dental procedure    . ANTARA 90 MG CAPS TAKE 1 CAPSULE BY MOUTH EVERY DAY 90 capsule 1  . apixaban (ELIQUIS) 5 MG TABS tablet Take 1 tablet (5 mg total) by mouth 2 (two) times daily. 180 tablet 3  .  aspirin 81 MG tablet Take 81 mg by mouth daily.    . Evolocumab (REPATHA SURECLICK) 675 MG/ML SOAJ Inject 140 mg into the skin every 14 (fourteen) days. 6 pen 4  . Icosapent Ethyl 1 g CAPS Take 2 g by mouth 2 (two) times daily. 120 capsule 5  . meloxicam (MOBIC) 15 MG tablet Take 15 mg by mouth daily as needed for pain.    . metoprolol tartrate (LOPRESSOR) 25 MG tablet Take 1 tablet (25 mg total) by mouth 2 (two) times daily. 180 tablet 3  . valsartan (DIOVAN) 80 MG tablet Take 1 tablet (80 mg total) by mouth daily. 90 tablet 3   No current facility-administered medications for this visit.     Allergies:   Statins; Codeine;  and Tape   Social History:  The patient  reports that he has never smoked. He has never used smokeless tobacco. He reports that he drinks alcohol. He reports that he does not use drugs.   Family History:  The patient's family history includes Colitis in his daughter; Crohn's disease in his daughter; Diabetes in his unknown relative; Hyperlipidemia in his father; Liver cancer in his father; Liver disease in his father.    ROS:  Please see the history of present illness.   Otherwise, review of systems is positive for none.   All other systems are reviewed and negative.    PHYSICAL EXAM: VS:  BP 118/80   Pulse (!) 119   Ht 5' 10"  (1.778 m)   Wt 239 lb 3.2 oz (108.5 kg)   SpO2 97%   BMI 34.32 kg/m  , BMI Body mass index is 34.32 kg/m. GEN: Well nourished, well developed, in no acute distress  HEENT: normal  Neck: no JVD, carotid bruits, or masses Cardiac: iRRR; no murmurs, rubs, or gallops,no edema  Respiratory:  clear to auscultation bilaterally, normal work of breathing GI: soft, nontender, nondistended, + BS MS: no deformity or atrophy  Skin: warm and dry Neuro:  Strength and sensation are intact Psych: euthymic mood, full affect  EKG:  EKG is ordered today. Personal review of the ekg ordered shows atrial flutter, rate 119   Recent Labs: 10/17/2017: ALT 69; BUN 19; Creatinine, Ser 1.17; Potassium 4.5; Sodium 140 12/12/2017: Hemoglobin 14.5; Platelets 242; TSH 2.040    Lipid Panel     Component Value Date/Time   CHOL 103 10/17/2017 0906   TRIG 71 10/17/2017 0906   HDL 48 10/17/2017 0906   CHOLHDL 2.1 10/17/2017 0906   CHOLHDL 3 07/15/2017 0855   VLDL 18.0 07/15/2017 0855   LDLCALC 41 10/17/2017 0906   LDLDIRECT 147.0 01/16/2016 0908     Wt Readings from Last 3 Encounters:  12/30/17 239 lb 3.2 oz (108.5 kg)  12/12/17 236 lb 9.6 oz (107.3 kg)  10/02/17 235 lb (106.6 kg)      Other studies Reviewed: Additional studies/ records that were reviewed today include:  TEE 09/23/17  Review of the above records today demonstrates:  - Left ventricle: Systolic function was moderately reduced. The   estimated ejection fraction was in the range of 35% to 40%.   Diffuse hypokinesis. - Aortic valve: No evidence of vegetation. There was trivial   regurgitation. - Aortic root: The aortic root was mildly dilated. - Mitral valve: Prior procedures included surgical repair. The   findings are consistent with mild stenosis. There was mild   regurgitation. - Left atrium: The atrium was severely dilated. No evidence of   thrombus in the  atrial cavity or appendage. - Atrial septum: No defect or patent foramen ovale was identified.   There was an atrial septal aneurysm. - Tricuspid valve: No evidence of vegetation. - Pulmonic valve: No evidence of vegetation.  TTE 02/19/17 - Left ventricle: The cavity size was normal. Wall thickness was   normal. Systolic function was normal. The estimated ejection   fraction was 55%. Wall motion was normal; there were no regional   wall motion abnormalities. Features are consistent with a   pseudonormal left ventricular filling pattern, with concomitant   abnormal relaxation and increased filling pressure (grade 2   diastolic dysfunction). - Aortic valve: There was mild regurgitation. - Mitral valve: Noncalcified, moderately thickened annulus. Mildly   thickened leaflets . Thickening. The findings are consistent with   mild stenosis. There was mild to moderate regurgitation directed   eccentrically. Valve area by pressure half-time: 1.85 cm^2. Valve   area by continuity equation (using LVOT flow): 1.8 cm^2.   following mitral valve repair. - Left atrium: The atrium was moderately dilated. - Dilated IVC with variation.  ASSESSMENT AND PLAN:  1.  Atrial flutter: Typical on EKG.  Currently on Eliquis.  He has had a prior ablation in 2009.  We discussed further therapy including ablation versus medical management.  Risks and benefits  of ablation were discussed and include bleeding, tamponade, heart block, stroke.  Patient understands these risks and is agreed to procedure.  This patients CHA2DS2-VASc Score and unadjusted Ischemic Stroke Rate (% per year) is equal to 3.2 % stroke rate/year from a score of 3  Above score calculated as 1 point each if present [CHF, HTN, DM, Vascular=MI/PAD/Aortic Plaque, Age if 65-74, or Male] Above score calculated as 2 points each if present [Age > 75, or Stroke/TIA/TE]  2.  Hypertension: Controlled.  No changes.  3.  Chronic systolic heart failure: Possibly related to atrial flutter with rapid rates.  We Jorden Minchey repeat echo with better rate control.  4.  Coronary artery disease: No current chest pain.  Continue with current management.  Current medicines are reviewed at length with the patient today.   The patient does not have concerns regarding his medicines.  The following changes were made today:  none  Labs/ tests ordered today include:  Orders Placed This Encounter  Procedures  . Basic Metabolic Panel (BMET)  . CBC  . EKG 12-Lead     Disposition:   FU with Kaynen Minner 3 months  Signed, Marcelene Weidemann Meredith Leeds, MD  12/30/2017 9:10 AM     [facility] [address] [place] [address] [phone] (office) [phone] (fax)

## 2017-12-30 ENCOUNTER — Encounter: Payer: Self-pay | Admitting: Cardiology

## 2017-12-30 ENCOUNTER — Ambulatory Visit: Payer: 59 | Admitting: Cardiology

## 2017-12-30 VITALS — BP 118/80 | HR 119 | Ht 70.0 in | Wt 239.2 lb

## 2017-12-30 DIAGNOSIS — I483 Typical atrial flutter: Secondary | ICD-10-CM

## 2017-12-30 DIAGNOSIS — I428 Other cardiomyopathies: Secondary | ICD-10-CM

## 2017-12-30 DIAGNOSIS — I10 Essential (primary) hypertension: Secondary | ICD-10-CM | POA: Diagnosis not present

## 2017-12-30 DIAGNOSIS — Z01812 Encounter for preprocedural laboratory examination: Secondary | ICD-10-CM

## 2017-12-30 DIAGNOSIS — I2510 Atherosclerotic heart disease of native coronary artery without angina pectoris: Secondary | ICD-10-CM

## 2017-12-30 LAB — BASIC METABOLIC PANEL
BUN/Creatinine Ratio: 16 (ref 10–24)
BUN: 18 mg/dL (ref 8–27)
CALCIUM: 10.1 mg/dL (ref 8.6–10.2)
CHLORIDE: 105 mmol/L (ref 96–106)
CO2: 19 mmol/L — AB (ref 20–29)
CREATININE: 1.11 mg/dL (ref 0.76–1.27)
GFR calc Af Amer: 81 mL/min/{1.73_m2} (ref >59)
GFR calc non Af Amer: 70 mL/min/{1.73_m2} (ref >59)
GLUCOSE: 111 mg/dL — AB (ref 65–99)
Potassium: 4.8 mmol/L (ref 3.5–5.2)
Sodium: 138 mmol/L (ref 134–144)

## 2017-12-30 LAB — CBC
HEMATOCRIT: 45.5 % (ref 37.5–51.0)
Hemoglobin: 15.4 g/dL (ref 13.0–17.7)
MCH: 31.3 pg (ref 26.6–33.0)
MCHC: 33.8 g/dL (ref 31.5–35.7)
MCV: 93 fL (ref 79–97)
Platelets: 275 10*3/uL (ref 150–450)
RBC: 4.92 x10E6/uL (ref 4.14–5.80)
RDW: 12.9 % (ref 12.3–15.4)
WBC: 6.4 10*3/uL (ref 3.4–10.8)

## 2017-12-30 NOTE — Patient Instructions (Signed)
Medication Instructions:  Your physician recommends that you continue on your current medications as directed. Please refer to the Current Medication list given to you today.  * If you need a refill on your cardiac medications before your next appointment, please call your pharmacy.   Labwork: Pre procedure labs today: BMET & CBC *We will only notify you of abnormal results, otherwise continue current treatment plan.  Testing/Procedures: Your physician has recommended that you have an ablation. Catheter ablation is a medical procedure used to treat some cardiac arrhythmias (irregular heartbeats). During catheter ablation, a long, thin, flexible tube is put into a blood vessel in your groin (upper thigh), or neck. This tube is called an ablation catheter. It is then guided to your heart through the blood vessel. Radio frequency waves destroy small areas of heart tissue where abnormal heartbeats may cause an arrhythmia to start. Please see the instruction sheet given to you today. Instructions for your ablation: 1. Please arrive at the [facility], Main Entrance "A", of [facility] at 6:30 a.m. on 01/29/2018. 2. Do not eat or drink after midnight the night prior to the procedure. 3. Do not miss any doses of ELIQUIS prior to the morning of the procedure.  4. Do not take any medications the morning of the procedure. 5. Both of your groins will need to be shaved for this procedure (if needed). We ask that you do this yourself at home 1-2 days prior to the procedure.  If you are unable/uncomfortable to do yourself, the hospital staff will shave you the day of your procedure (if needed). 6. Plan for an overnight stay in the hospital. 7. You will need someone to drive you home at discharge.   Follow-Up: Your physician recommends that you schedule a follow-up appointment in: 4 weeks, after your procedure on 01/29/2018, with Dr. Curt Bears.  *Please note that any paperwork needing to be filled out  by the provider will need to be addressed at the front desk prior to seeing the provider. Please note that any FMLA, disability or other documents regarding health condition is subject to a $25.00 charge that must be received prior to completion of paperwork in the form of a money order or check.  Thank you for choosing [facility]!!   Trinidad Curet, RN [phone]  Any Other Special Instructions Will Be Listed Below (If Applicable).   Cardiac Ablation Cardiac ablation is a procedure to disable (ablate) a small amount of heart tissue in very specific places. The heart has many electrical connections. Sometimes these connections are abnormal and can cause the heart to beat very fast or irregularly. Ablating some of the problem areas can improve the heart rhythm or return it to normal. Ablation may be done for people who:  Have Wolff-Parkinson-White syndrome.  Have fast heart rhythms (tachycardia).  Have taken medicines for an abnormal heart rhythm (arrhythmia) that were not effective or caused side effects.  Have a high-risk heartbeat that may be life-threatening.  During the procedure, a small incision is made in the neck or the groin, and a long, thin, flexible tube (catheter) is inserted into the incision and moved to the heart. Small devices (electrodes) on the tip of the catheter will send out electrical currents. A type of X-ray (fluoroscopy) will be used to help guide the catheter and to provide images of the heart. Tell a health care provider about:  Any allergies you have.  All medicines you are taking, including vitamins, herbs, eye drops, creams,  and over-the-counter medicines.  Any problems you or family members have had with anesthetic medicines.  Any blood disorders you have.  Any surgeries you have had.  Any medical conditions you have, such as kidney failure.  Whether you are pregnant or may be pregnant. What are the risks? Generally, this is a safe  procedure. However, problems may occur, including:  Infection.  Bruising and bleeding at the catheter insertion site.  Bleeding into the chest, especially into the sac that surrounds the heart. This is a serious complication.  Stroke or blood clots.  Damage to other structures or organs.  Allergic reaction to medicines or dyes.  Need for a permanent pacemaker if the normal electrical system is damaged. A pacemaker is a small computer that sends electrical signals to the heart and helps your heart beat normally.  The procedure not being fully effective. This may not be recognized until months later. Repeat ablation procedures are sometimes required.  What happens before the procedure?  Follow instructions from your health care provider about eating or drinking restrictions.  Ask your health care provider about: ? Changing or stopping your regular medicines. This is especially important if you are taking diabetes medicines or blood thinners. ? Taking medicines such as aspirin and ibuprofen. These medicines can thin your blood. Do not take these medicines before your procedure if your health care provider instructs you not to.  Plan to have someone take you home from the hospital or clinic.  If you will be going home right after the procedure, plan to have someone with you for 24 hours. What happens during the procedure?  To lower your risk of infection: ? Your health care team will wash or sanitize their hands. ? Your skin will be washed with soap. ? Hair may be removed from the incision area.  An IV tube will be inserted into one of your veins.  You will be given a medicine to help you relax (sedative).  The skin on your neck or groin will be numbed.  An incision will be made in your neck or your groin.  A needle will be inserted through the incision and into a large vein in your neck or groin.  A catheter will be inserted into the needle and moved to your heart.  Dye  may be injected through the catheter to help your surgeon see the area of the heart that needs treatment.  Electrical currents will be sent from the catheter to ablate heart tissue in desired areas. There are three types of energy that may be used to ablate heart tissue: ? Heat (radiofrequency energy). ? Laser energy. ? Extreme cold (cryoablation).  When the necessary tissue has been ablated, the catheter will be removed.  Pressure will be held on the catheter insertion area to prevent excessive bleeding.  A bandage (dressing) will be placed over the catheter insertion area. The procedure may vary among health care providers and hospitals. What happens after the procedure?  Your blood pressure, heart rate, breathing rate, and blood oxygen level will be monitored until the medicines you were given have worn off.  Your catheter insertion area will be monitored for bleeding. You will need to lie still for a few hours to ensure that you do not bleed from the catheter insertion area.  Do not drive for 24 hours or as long as directed by your health care provider. Summary  Cardiac ablation is a procedure to disable (ablate) a small amount of heart tissue in  very specific places. Ablating some of the problem areas can improve the heart rhythm or return it to normal.  During the procedure, electrical currents will be sent from the catheter to ablate heart tissue in desired areas. This information is not intended to replace advice given to you by your health care provider. Make sure you discuss any questions you have with your health care provider. Document Released: 09/23/2008 Document Revised: 03/26/2016 Document Reviewed: 03/26/2016 Elsevier Interactive Patient Education  [employer].

## 2018-01-27 ENCOUNTER — Encounter: Payer: 59 | Admitting: Family Medicine

## 2018-01-28 NOTE — Anesthesia Preprocedure Evaluation (Addendum)
Anesthesia Evaluation  Patient identified by MRN, date of birth, ID band Patient awake    Reviewed: Allergy & Precautions, NPO status , Patient's Chart, lab work & pertinent test results  Airway Mallampati: II  TM Distance: >3 FB Neck ROM: Full    Dental no notable dental hx. (+) Teeth Intact, Dental Advisory Given   Pulmonary neg pulmonary ROS,    Pulmonary exam normal breath sounds clear to auscultation       Cardiovascular hypertension, + Peripheral Vascular Disease  + dysrhythmias Atrial Fibrillation + Valvular Problems/Murmurs MR  Rhythm:Irregular Rate:Normal + Systolic murmurs Left ventricle: Systolic function was moderately reduced. The   estimated ejection fraction was in the range of 35% to 40%.   Diffuse hypokinesis. - Aortic valve: No evidence of vegetation. There was trivial   regurgitation. - Aortic root: The aortic root was mildly dilated. - Mitral valve: Prior procedures included surgical repair. The   findings are consistent with mild stenosis. There was mild   regurgitation. - Left atrium: The atrium was severely dilated. No evidence of   thrombus in the atrial cavity or appendage. - Atrial septum: No defect or patent foramen ovale was identified.   There was an atrial septal aneurysm. - Tricuspid valve: No evidence of vegetation. - Pulmonic valve: No evidence of vegetation.  Impressions:  - Moderate global reduction in LV systolic function; mildly dilated   aortic root; trace AI; s/p MV repair with mild MS (mean gradient   5 mmHg) and mild MR; severe LAE; mild TR.  mitral valve repair in march 2009   Neuro/Psych negative neurological ROS  negative psych ROS   GI/Hepatic negative GI ROS, Neg liver ROS,   Endo/Other  negative endocrine ROS  Renal/GU negative Renal ROS  negative genitourinary   Musculoskeletal negative musculoskeletal ROS (+)   Abdominal   Peds negative pediatric ROS (+)   Hematology negative hematology ROS (+)   Anesthesia Other Findings [facility]  ECHO TEE (ENDO) W COLOR AND SPECT  Order# [id number]  Reading physician: Lelon Perla, MD Ordering physician: Lelon Perla, MD Study date: 09/23/17 Study Result   Result status: Edited Result - FINAL                             *[facility]*                         [address], [place] [address]                            [phone]  ------------------------------------------------------------------- Transesophageal Echocardiography  (Report amended )  Patient:    [name], [name] MR #:       [id number] Study Date: 09/23/2017 Gender:     M Age:        67 Height:     177.8 cm Weight:     107.5 kg BSA:        2.34 m^2 Pt. Status: Room:   ADMITTING    Kirk Ruths  [place]  PERFORMING   Kirk Ruths  REFERRING    Kirk Ruths  SONOGRAPHER  Jannett Celestine, RDCS  cc:  ------------------------------------------------------------------- LV EF: 35% -   40%  ------------------------------------------------------------------- History:   PMH:  Cardioversion.  Murmur.  Atrial flutter.  Coronary artery disease.  Mitral valve prolapse.  Risk factors:  Mitral Valve Repair  Dyslipidemia.  ------------------------------------------------------------------- Study Conclusions  - Left ventricle: Systolic function was moderately reduced. The   estimated ejection fraction was in the range of 35% to 40%.   Diffuse hypokinesis. - Aortic valve: No evidence of vegetation. There was trivial   regurgitation. - Aortic root: The aortic root was mildly dilated. - Mitral valve: Prior procedures included surgical repair. The   findings are consistent with mild stenosis. There was mild   regurgitation. - Left atrium: The atrium was severely dilated. No  evidence of   thrombus in the atrial cavity or appendage. - Atrial septum: No defect or patent foramen ovale was identified.   There was an atrial septal aneurysm. - Tricuspid valve: No evidence of vegetation. - Pulmonic valve: No evidence of vegetation.  Impressions:  - Moderate global reduction in LV systolic function; mildly dilated   aortic root; trace AI; s/p MV repair with mild MS (mean gradient   5 mmHg) and mild MR; severe LAE; mild TR.  ------------------------------------------------------------------- Study data:   Study status:  Routine.  Consent:  The risks, benefits, and alternatives to the procedure were explained to the patient and informed consent was obtained.  Procedure:  The patient reported no pain pre or post test. Initial setup. The patient was brought to the laboratory. Surface ECG leads were monitored. Sedation. Conscious sedation was administered by anesthesiology staff. Transesophageal echocardiography. A transesophageal probe was inserted by the attending cardiologistwithout difficulty. Image quality was adequate.  Study completion:  The patient tolerated the procedure well. There were no complications.          Diagnostic transesophageal echocardiography.  2D and color Doppler. Birthdate:  Patient birthdate: February 27, 1951.  Age:  Patient is 67 yr old.  Sex:  Gender: male.    BMI: 34 kg/m^2.  Blood pressure: 100/57  Study date:  Study date: 09/23/2017. Study time: 09:54 AM. Location:  Endoscopy.  -------------------------------------------------------------------  ------------------------------------------------------------------- Left ventricle:  Systolic function was moderately reduced. The estimated ejection fraction was in the range of 35% to 40%. Diffuse hypokinesis.  ------------------------------------------------------------------- Aortic valve:   Trileaflet; mildly thickened leaflets. Cusp separation was normal.  No evidence of  vegetation.  Doppler:  There was trivial regurgitation.  ------------------------------------------------------------------- Aorta:  Aortic root: The aortic root was mildly dilated. Descending aorta: The descending aorta had mild diffuse disease.  ------------------------------------------------------------------- Mitral valve:  Prior procedures included surgical repair.  Doppler:   The findings are consistent with mild stenosis.   There was mild regurgitation.    Mean gradient (D): 5 mm Hg.  ------------------------------------------------------------------- Left atrium:  The atrium was severely dilated.  No evidence of thrombus in the atrial cavity or appendage.  ------------------------------------------------------------------- Atrial septum:  No defect or patent foramen ovale was identified. There was an atrial septal aneurysm.  ------------------------------------------------------------------- Right ventricle:  The cavity size was normal. Systolic function was normal.  ------------------------------------------------------------------- Pulmonic valve:    Structurally normal valve.   Cusp separation was normal.  No evidence of vegetation.  Doppler:  There was trivial regurgitation.  ------------------------------------------------------------------- Tricuspid valve:   Structurally normal valve.   Leaflet separation was normal.  No evidence of vegetation.  Doppler:  There was mild regurgitation.  -------------------------------------------------------------------  Right atrium:  The atrium was normal in size.  ------------------------------------------------------------------- Pericardium:  There was no pericardial effusion.  ------------------------------------------------------------------- Measurements   Mitral valve                 Value  Mitral mean velocity, D      102   cm/s  Mitral mean gradient, D      5     mm Hg  Mitral annulus VTI, D         20.1  cm  Legend: (L)  and  (H)  mark values outside specified reference range.  ------------------------------------------------------------------- [place] Muta Crenshaw 2019-05-06T15:22:11 MERGE Images   Show images for ECHO TEE Patient Information   Patient Name General, Wearing Sex Male DOB 04/17/1951 SSN [id number] Reason for Exam  Priority: Routine  Comments:  Surgical History   Surgical History    Procedure Laterality Date Comment Source CARDIAC CATHETERIZATION    Provider  Other Surgical History    Procedure Laterality Date Comment Source BACK SURGERY   secondary to ruptured disc Provider CARDIOVERSION N/A 09/23/2017 Procedure: CARDIOVERSION; Surgeon: Lelon Perla, MD; Location: [facility]; Service: Cardiovascular; Laterality: N/A; Provider heart ablation    Provider MITRAL VALVE REPAIR  06/2007  Provider TEE WITHOUT CARDIOVERSION N/A 09/23/2017 Procedure: TRANSESOPHAGEAL ECHOCARDIOGRAM (TEE); Surgeon: Lelon Perla, MD; Location: [facility] ENDOSCOPY; Service: Cardiovascular; Laterality: N/A; Provider TONSILLECTOMY  1960  Provider TOTAL KNEE ARTHROPLASTY Bilateral 03/09/2015 Procedure: TOTAL KNEE BILATERAL; Surgeon: Gaynelle Arabian, MD; Location: [facility]S; Service: Orthopedics; Laterality: Bilateral; Provider  Performing Technologist/Nurse   Performing Technologist/Nurse: Jannett Celestine          Implants    Implant Cap Knee Total 3 - [phone] - Capitated Charge Entry     Inventory item: CAP KNEE TOTAL 3 SIGMA Model/Cat number: [id number] Manufacturer: [employer]   As of 03/09/2015     Status: Scientist, physiological    Order-Level Documents:   There are no order-level documents.  Encounter-Level Documents - 09/23/2017:   Scan on 09/24/2017 12:33 PM by Default, Provider, MDScan on 09/24/2017 12:33 PM by Default, Provider, MD  Scan on 09/24/2017 12:16 PM by Default, Provider, MDScan on 09/24/2017 12:16 PM by Default, Provider,  MD  Document on 09/23/2017 10:23 AM by Lavena Bullion, RN: IP After Visit Summary  Electronic signature on 09/23/2017 8:42 AM - Signed    Signed   Electronically signed by Lelon Perla, MD on 09/23/17 at 1252 EDT Electronically addended on 09/23/17 at 1522 EDT Printable Result Report    Result Report  External Result Report    External Result Report     Reproductive/Obstetrics negative OB ROS                            Anesthesia Physical  Anesthesia Plan  ASA: III  Anesthesia Plan: General   Post-op Pain Management:    Induction: Intravenous  PONV Risk Score and Plan: 2 and Treatment may vary due to age or medical condition, Ondansetron and Dexamethasone  Airway Management Planned: Oral ETT  Additional Equipment:   Intra-op Plan:   Post-operative Plan: Extubation in OR  Informed Consent: I have reviewed the patients History and Physical, chart, labs and discussed the procedure including the risks, benefits and alternatives for the proposed anesthesia with the patient or authorized representative who has indicated his/her understanding and acceptance.   Dental advisory given  Plan Discussed with: CRNA and Surgeon  Anesthesia Plan  Comments:        Anesthesia Quick Evaluation

## 2018-01-29 ENCOUNTER — Encounter (HOSPITAL_COMMUNITY): Payer: Self-pay | Admitting: Certified Registered Nurse Anesthetist

## 2018-01-29 ENCOUNTER — Ambulatory Visit (HOSPITAL_COMMUNITY)
Admission: RE | Admit: 2018-01-29 | Discharge: 2018-01-29 | Disposition: A | Discharge status: Home / Self Care | Payer: No Typology Code available for payment source | Source: Ambulatory Visit | Attending: Cardiology | Admitting: Cardiology

## 2018-01-29 ENCOUNTER — Ambulatory Visit (HOSPITAL_COMMUNITY): Payer: No Typology Code available for payment source | Admitting: Anesthesiology

## 2018-01-29 ENCOUNTER — Ambulatory Visit (HOSPITAL_COMMUNITY)
Admission: RE | Disposition: A | Discharge status: Home / Self Care | Payer: Self-pay | Source: Ambulatory Visit | Attending: Cardiology

## 2018-01-29 DIAGNOSIS — E785 Hyperlipidemia, unspecified: Secondary | ICD-10-CM | POA: Diagnosis not present

## 2018-01-29 DIAGNOSIS — I10 Essential (primary) hypertension: Secondary | ICD-10-CM | POA: Diagnosis not present

## 2018-01-29 DIAGNOSIS — I739 Peripheral vascular disease, unspecified: Secondary | ICD-10-CM | POA: Insufficient documentation

## 2018-01-29 DIAGNOSIS — I4891 Unspecified atrial fibrillation: Secondary | ICD-10-CM | POA: Diagnosis not present

## 2018-01-29 DIAGNOSIS — I341 Nonrheumatic mitral (valve) prolapse: Secondary | ICD-10-CM | POA: Diagnosis not present

## 2018-01-29 DIAGNOSIS — I4892 Unspecified atrial flutter: Secondary | ICD-10-CM | POA: Insufficient documentation

## 2018-01-29 HISTORY — PX: A-FLUTTER ABLATION: EP1230

## 2018-01-29 LAB — CBC
HEMATOCRIT: 46.1 % (ref 39.0–52.0)
Hemoglobin: 15.4 g/dL (ref 13.0–17.0)
MCH: 31.1 pg (ref 26.0–34.0)
MCHC: 33.4 g/dL (ref 30.0–36.0)
MCV: 93.1 fL (ref 78.0–100.0)
Platelets: 201 10*3/uL (ref 150–400)
RBC: 4.95 MIL/uL (ref 4.22–5.81)
RDW: 12.1 % (ref 11.5–15.5)
WBC: 5.9 10*3/uL (ref 4.0–10.5)

## 2018-01-29 LAB — BASIC METABOLIC PANEL
ANION GAP: 13 (ref 5–15)
BUN: 23 mg/dL (ref 8–23)
CALCIUM: 9.3 mg/dL (ref 8.9–10.3)
CO2: 21 mmol/L — ABNORMAL LOW (ref 22–32)
Chloride: 105 mmol/L (ref 98–111)
Creatinine, Ser: 1.14 mg/dL (ref 0.61–1.24)
GFR calc Af Amer: >60 mL/min (ref >60)
GFR calc non Af Amer: >60 mL/min (ref >60)
GLUCOSE: 126 mg/dL — AB (ref 70–99)
POTASSIUM: 4.5 mmol/L (ref 3.5–5.1)
Sodium: 139 mmol/L (ref 135–145)

## 2018-01-29 SURGERY — A-FLUTTER ABLATION
Anesthesia: General

## 2018-01-29 MED ORDER — LIDOCAINE 2% (20 MG/ML) 5 ML SYRINGE
INTRAMUSCULAR | Status: DC | PRN
  Administered 2018-01-29: 60 mg via INTRAVENOUS

## 2018-01-29 MED ORDER — ROCURONIUM BROMIDE 50 MG/5ML IV SOSY
PREFILLED_SYRINGE | INTRAVENOUS | Status: DC | PRN
  Administered 2018-01-29: 50 mg via INTRAVENOUS

## 2018-01-29 MED ORDER — EPHEDRINE SULFATE 50 MG/ML IJ SOLN
INTRAMUSCULAR | Status: DC | PRN
  Administered 2018-01-29: 10 mg via INTRAVENOUS

## 2018-01-29 MED ORDER — DEXAMETHASONE SODIUM PHOSPHATE 10 MG/ML IJ SOLN
INTRAMUSCULAR | Status: DC | PRN
  Administered 2018-01-29: 4 mg via INTRAVENOUS

## 2018-01-29 MED ORDER — SUGAMMADEX SODIUM 200 MG/2ML IV SOLN
INTRAVENOUS | Status: DC | PRN
  Administered 2018-01-29: 250 mg via INTRAVENOUS

## 2018-01-29 MED ORDER — SODIUM CHLORIDE 0.9% FLUSH: 3.0000 mL | Freq: Two times a day (BID) | INTRAVENOUS | Status: DC

## 2018-01-29 MED ORDER — BUPIVACAINE HCL (PF) 0.25 % IJ SOLN
INTRAMUSCULAR | Status: AC
  Filled 2018-01-29: qty 30

## 2018-01-29 MED ORDER — SODIUM CHLORIDE 0.9 % IV SOLN: 250.0000 mL | INTRAVENOUS | Status: DC | PRN

## 2018-01-29 MED ORDER — FENTANYL CITRATE (PF) 100 MCG/2ML IJ SOLN
INTRAMUSCULAR | Status: DC | PRN
  Administered 2018-01-29: 50 ug via INTRAVENOUS

## 2018-01-29 MED ORDER — ONDANSETRON HCL 4 MG/2ML IJ SOLN: 4.0000 mg | Freq: Four times a day (QID) | INTRAMUSCULAR | Status: DC | PRN

## 2018-01-29 MED ORDER — ONDANSETRON HCL 4 MG/2ML IJ SOLN
INTRAMUSCULAR | Status: DC | PRN
  Administered 2018-01-29: 4 mg via INTRAVENOUS

## 2018-01-29 MED ORDER — ACETAMINOPHEN 325 MG PO TABS
650.0000 mg | ORAL_TABLET | ORAL | Status: DC | PRN
  Filled 2018-01-29: qty 2

## 2018-01-29 MED ORDER — HEPARIN (PORCINE) IN NACL 1000-0.9 UT/500ML-% IV SOLN
INTRAVENOUS | Status: AC
  Filled 2018-01-29: qty 500

## 2018-01-29 MED ORDER — BUPIVACAINE HCL (PF) 0.25 % IJ SOLN
INTRAMUSCULAR | Status: DC | PRN
  Administered 2018-01-29: 30 mL

## 2018-01-29 MED ORDER — SODIUM CHLORIDE 0.9% FLUSH: 3.0000 mL | INTRAVENOUS | Status: DC | PRN

## 2018-01-29 MED ORDER — MIDAZOLAM HCL 5 MG/5ML IJ SOLN
INTRAMUSCULAR | Status: DC | PRN
  Administered 2018-01-29 (×2): 1 mg via INTRAVENOUS

## 2018-01-29 MED ORDER — SODIUM CHLORIDE 0.9 % IV SOLN
INTRAVENOUS | Status: DC | PRN
  Administered 2018-01-29: 50 ug/min via INTRAVENOUS

## 2018-01-29 MED ORDER — PROPOFOL 10 MG/ML IV BOLUS
INTRAVENOUS | Status: DC | PRN
  Administered 2018-01-29: 160 mg via INTRAVENOUS

## 2018-01-29 MED ORDER — PHENYLEPHRINE HCL 10 MG/ML IJ SOLN
INTRAMUSCULAR | Status: DC | PRN
  Administered 2018-01-29: 160 ug via INTRAVENOUS
  Administered 2018-01-29: 240 ug via INTRAVENOUS

## 2018-01-29 MED ORDER — SODIUM CHLORIDE 0.9 % IV SOLN
INTRAVENOUS | Status: DC
  Administered 2018-01-29: 07:00:00 via INTRAVENOUS

## 2018-01-29 SURGICAL SUPPLY — 14 items
BAG SNAP BAND KOVER 36X36 (MISCELLANEOUS) ×2 IMPLANT
BLANKET WARM UNDERBOD FULL ACC (MISCELLANEOUS) ×2 IMPLANT
CATH EZ STEER NAV 8MM D-F CUR (ABLATOR) ×2 IMPLANT
CATH JOSEPH QUAD ALLRED 6F REP (CATHETERS) ×2 IMPLANT
CATH WEBSTER BI DIR CS D-F CRV (CATHETERS) ×2 IMPLANT
HOVERMATT SINGLE USE (MISCELLANEOUS) ×2 IMPLANT
PACK EP LATEX FREE (CUSTOM PROCEDURE TRAY) ×3
PACK EP LF (CUSTOM PROCEDURE TRAY) ×1 IMPLANT
PAD PRO RADIOLUCENT 2001M-C (PAD) ×3 IMPLANT
PATCH CARTO3 (PAD) ×2 IMPLANT
SHEATH PINNACLE 6F 10CM (SHEATH) ×2 IMPLANT
SHEATH PINNACLE 7F 10CM (SHEATH) ×2 IMPLANT
SHEATH PINNACLE 8F 10CM (SHEATH) ×2 IMPLANT
SHIELD RADPAD SCOOP 12X17 (MISCELLANEOUS) ×2 IMPLANT

## 2018-01-29 NOTE — H&P (Signed)
[name] has presented today for surgery, with the diagnosis of atrial flutter.  The various methods of treatment have been discussed with the patient and family. After consideration of risks, benefits and other options for treatment, the patient has consented to  Procedure(s): Catheter ablation as a surgical intervention .  Risks include but not limited to bleeding, tamponade, heart block, stroke, damage to surrounding organs, among others. The patient's history has been reviewed, patient examined, no change in status, stable for surgery.  I have reviewed the patient's chart and labs.  Questions were answered to the patient's satisfaction.    Romone Shaff Curt Bears, MD 01/29/2018 7:25 AM

## 2018-01-29 NOTE — Progress Notes (Signed)
Site area: Right groin a 6,7,and 8 french venous sheath was removed  Site Prior to Removal:  Level 0  Pressure Applied For 20 MINUTES    Bedrest Beginning at 1020am  Manual:   Yes.    Patient Status During Pull:  stable  Post Pull Groin Site:  Level 0  Post Pull Instructions Given:  Yes.    Post Pull Pulses Present:  Yes.    Dressing Applied:  Yes.    Comments:  VS remain stable

## 2018-01-29 NOTE — Anesthesia Postprocedure Evaluation (Signed)
Anesthesia Post Note  Patient: [name]  Procedure(s) Performed: A-FLUTTER ABLATION (N/A )     Patient location during evaluation: PACU Anesthesia Type: General Level of consciousness: awake and alert Pain management: pain level controlled Vital Signs Assessment: post-procedure vital signs reviewed and stable Respiratory status: spontaneous breathing, nonlabored ventilation, respiratory function stable and patient connected to nasal cannula oxygen Cardiovascular status: blood pressure returned to baseline and stable Postop Assessment: no apparent nausea or vomiting Anesthetic complications: no    Last Vitals:  Vitals:   01/29/18 1023 01/29/18 1025  BP:  101/64  Pulse:  74  Resp:  13  Temp: (!) 36.3 C   SpO2:  97%    Last Pain:  Vitals:   01/29/18 1023  TempSrc:   PainSc: 0-No pain                 Anicia Leuthold S

## 2018-01-29 NOTE — Transfer of Care (Signed)
Immediate Anesthesia Transfer of Care Note  Patient: [name]  Procedure(s) Performed: A-FLUTTER ABLATION (N/A )  Patient Location: PACU and Cath Lab  Anesthesia Type:General  Level of Consciousness: awake and alert   Airway & Oxygen Therapy: Patient Spontanous Breathing and Patient connected to nasal cannula oxygen  Post-op Assessment: Report given to RN, Post -op Vital signs reviewed and stable and Patient moving all extremities X 4  Post vital signs: Reviewed and stable  Last Vitals:  Vitals Value Taken Time  BP 87/55 01/29/2018  9:55 AM  Temp 36.5 C 01/29/2018  9:49 AM  Pulse 75 01/29/2018 10:00 AM  Resp 10 01/29/2018 10:00 AM  SpO2 97 % 01/29/2018 10:00 AM  Vitals shown include unvalidated device data.  Last Pain:  Vitals:   01/29/18 0949  TempSrc:   PainSc: 0-No pain      Patients Stated Pain Goal: 3 (24/40/10 2725)  Complications: No apparent anesthesia complications

## 2018-01-29 NOTE — Discharge Instructions (Signed)
Post procedure care/activity instructions No driving for 4 days. No lifting over 5 lbs for 1 week. No vigorous or sexual activity for 1 week. You may return to work on 02/05/18. Keep procedure site clean & dry. If you notice increased pain, swelling, bleeding or pus, call/return!  You may shower, but no soaking baths/hot tubs/pools for 1 week.    You have an appointment set up with the [facility].  Multiple studies have shown that being followed by a dedicated atrial fibrillation clinic in addition to the standard care you receive from your other physicians improves health. We believe that enrollment in the atrial fibrillation clinic will allow [place] to better care for you.   The phone number to the [facility] is [phone]. The clinic is staffed Monday through Friday from 8:30am to 5pm.  Parking Directions: The clinic is located in the Heart and Vascular Building connected to [facility]. 1)From [address] turn on to [address] and go to the 3rd entrance  (Heart and Vascular entrance) on the right. 2)Look to the right for Heart &Vascular Parking Garage. 3)A code for the entrance is required please call the clinic to receive this.   4)Take the elevators to the 1st floor. Registration is in the room with the glass walls at the end of the hallway.  If you have any trouble parking or locating the clinic, please dont hesitate to call [phone].

## 2018-01-29 NOTE — Anesthesia Procedure Notes (Signed)
Procedure Name: Intubation Date/Time: 01/29/2018 8:48 AM Performed by: Inda Coke, CRNA Pre-anesthesia Checklist: Patient identified, Emergency Drugs available, Suction available and Patient being monitored Patient Re-evaluated:Patient Re-evaluated prior to induction Oxygen Delivery Method: Circle System Utilized Preoxygenation: Pre-oxygenation with 100% oxygen Induction Type: IV induction and Cricoid Pressure applied Ventilation: Mask ventilation without difficulty and Oral airway inserted - appropriate to patient size Laryngoscope Size: Mac and 4 Grade View: Grade I Tube type: Oral Tube size: 7.5 mm Number of attempts: 1 Airway Equipment and Method: Stylet and Oral airway Placement Confirmation: ETT inserted through vocal cords under direct vision,  positive ETCO2 and breath sounds checked- equal and bilateral Secured at: 23 cm Tube secured with: Tape Dental Injury: Teeth and Oropharynx as per pre-operative assessment

## 2018-01-30 ENCOUNTER — Encounter (HOSPITAL_COMMUNITY): Payer: Self-pay | Admitting: Cardiology

## 2018-01-30 MED FILL — Heparin Sod (Porcine)-NaCl IV Soln 1000 Unit/500ML-0.9%: INTRAVENOUS | Qty: 1000 | Status: AC

## 2018-02-04 ENCOUNTER — Encounter: Payer: Self-pay | Admitting: Family Medicine

## 2018-02-12 ENCOUNTER — Encounter (HOSPITAL_COMMUNITY): Payer: Self-pay | Admitting: Nurse Practitioner

## 2018-02-12 ENCOUNTER — Ambulatory Visit (HOSPITAL_COMMUNITY)
Admit: 2018-02-12 | Discharge: 2018-02-12 | Disposition: A | Discharge status: Home / Self Care | Payer: 59 | Source: Ambulatory Visit | Attending: Nurse Practitioner | Admitting: Nurse Practitioner

## 2018-02-12 VITALS — BP 134/72 | HR 67 | Ht 70.0 in | Wt 242.0 lb

## 2018-02-12 DIAGNOSIS — E785 Hyperlipidemia, unspecified: Secondary | ICD-10-CM | POA: Insufficient documentation

## 2018-02-12 DIAGNOSIS — Z7901 Long term (current) use of anticoagulants: Secondary | ICD-10-CM | POA: Insufficient documentation

## 2018-02-12 DIAGNOSIS — Z7982 Long term (current) use of aspirin: Secondary | ICD-10-CM | POA: Insufficient documentation

## 2018-02-12 DIAGNOSIS — Z79899 Other long term (current) drug therapy: Secondary | ICD-10-CM | POA: Diagnosis not present

## 2018-02-12 DIAGNOSIS — Q631 Lobulated, fused and horseshoe kidney: Secondary | ICD-10-CM | POA: Diagnosis not present

## 2018-02-12 DIAGNOSIS — Z800 Family history of malignant neoplasm of digestive organs: Secondary | ICD-10-CM | POA: Diagnosis not present

## 2018-02-12 DIAGNOSIS — Z885 Allergy status to narcotic agent status: Secondary | ICD-10-CM | POA: Diagnosis not present

## 2018-02-12 DIAGNOSIS — I2510 Atherosclerotic heart disease of native coronary artery without angina pectoris: Secondary | ICD-10-CM | POA: Insufficient documentation

## 2018-02-12 DIAGNOSIS — R0602 Shortness of breath: Secondary | ICD-10-CM | POA: Diagnosis not present

## 2018-02-12 DIAGNOSIS — Z96653 Presence of artificial knee joint, bilateral: Secondary | ICD-10-CM | POA: Diagnosis not present

## 2018-02-12 DIAGNOSIS — I484 Atypical atrial flutter: Secondary | ICD-10-CM | POA: Diagnosis not present

## 2018-02-12 DIAGNOSIS — R5383 Other fatigue: Secondary | ICD-10-CM | POA: Diagnosis not present

## 2018-02-12 DIAGNOSIS — Z888 Allergy status to other drugs, medicaments and biological substances status: Secondary | ICD-10-CM | POA: Diagnosis not present

## 2018-02-12 NOTE — Progress Notes (Signed)
Primary Care Physician: Carollee Herter, Alferd Apa, DO Referring Physician:Dr. Bolton Canupp is a 67 y.o. male with a h/o CAD, EF of 35-40%,CVD, MVR in 2009, who recently presented for a typical atrial flutter ablation but it was found that he had left atrial atypical flutter ablation, so procedure was not done and the pt was cardioverted and d/c home. He was asked to f/u in the afib clinic to discuss antiarrythmic's. Pt is in SR  today and has not noted any further arrhythmia but is still bothered with shortness of breath and fatigue.  Today, he denies symptoms of palpitations, chest pain, shortness of breath, orthopnea, PND, lower extremity edema, dizziness, presyncope, syncope, or neurologic sequela. The patient is tolerating medications without difficulties and is otherwise without complaint today.   Past Medical History:  Diagnosis Date  . Atrial flutter ([place])    s/p ablation  . CAD (coronary artery disease)    cath 06/2007 40% LM  . CVD (cerebrovascular disease)    59% right ICA, 49% left ICA; h/oTIA; fu study 8/11 with normal carotids  . Dysrhythmia   . Gait abnormality   . Heart murmur   . History of bronchitis   . History of colonoscopy   . Horseshoe kidney   . Hyperlipidemia   . MR (mitral regurgitation)    severe; mitral valve repair in march 2009  . MVP (mitral valve prolapse)   . Osteoarthritis    both knees  . Shortness of breath dyspnea    increased exertion    Past Surgical History:  Procedure Laterality Date  . A-FLUTTER ABLATION N/A 01/29/2018   Procedure: A-FLUTTER ABLATION;  Surgeon: Constance Haw, MD;  Location: [facility];  Service: Cardiovascular;  Laterality: N/A;  . BACK SURGERY     secondary to ruptured disc  . CARDIAC CATHETERIZATION    . CARDIOVERSION N/A 09/23/2017   Procedure: CARDIOVERSION;  Surgeon: Lelon Perla, MD;  Location: [facility]OS[facility];  Service: Cardiovascular;  Laterality: N/A;  . heart ablation    . MITRAL  VALVE REPAIR  06/2007  . TEE WITHOUT CARDIOVERSION N/A 09/23/2017   Procedure: TRANSESOPHAGEAL ECHOCARDIOGRAM (TEE);  Surgeon: Lelon Perla, MD;  Location: [facility];  Service: Cardiovascular;  Laterality: N/A;  . TONSILLECTOMY  1960  . TOTAL KNEE ARTHROPLASTY Bilateral 03/09/2015   Procedure: TOTAL KNEE BILATERAL;  Surgeon: Gaynelle Arabian, MD;  Location: [facility];  Service: Orthopedics;  Laterality: Bilateral;    Current Outpatient Medications  Medication Sig Dispense Refill  . ANTARA 90 MG CAPS TAKE 1 CAPSULE BY MOUTH EVERY DAY 90 capsule 1  . apixaban (ELIQUIS) 5 MG TABS tablet Take 1 tablet (5 mg total) by mouth 2 (two) times daily. 180 tablet 3  . aspirin 81 MG tablet Take 81 mg by mouth daily.    . Evolocumab (REPATHA SURECLICK) 193 MG/ML SOAJ Inject 140 mg into the skin every 14 (fourteen) days. 6 pen 4  . Icosapent Ethyl 1 g CAPS Take 2 g by mouth 2 (two) times daily. 120 capsule 5  . meloxicam (MOBIC) 15 MG tablet Take 15 mg by mouth daily as needed for pain.    . metoprolol tartrate (LOPRESSOR) 25 MG tablet Take 1 tablet (25 mg total) by mouth 2 (two) times daily. 180 tablet 3  . valsartan (DIOVAN) 80 MG tablet Take 1 tablet (80 mg total) by mouth daily. 90 tablet 3  . amoxicillin (AMOXIL) 500 MG capsule Take 2,000 mg by mouth  See admin instructions. Take 2000 mg by mouth 1 hour dental procedure     No current facility-administered medications for this encounter.     Allergies  Allergen Reactions  . Statins Other (See Comments)    Messes with liver function  . Codeine Nausea Only  . Tape Rash    Social History   Socioeconomic History  . Marital status: Married    Spouse name: Not on file  . Number of children: Not on file  . Years of education: Not on file  . Highest education level: Not on file  Occupational History  . Occupation: [employer]: works from home  Social Needs  . Financial resource strain: Not on file  . Food insecurity:    Worry:  Not on file    Inability: Not on file  . Transportation needs:    Medical: Not on file    Non-medical: Not on file  Tobacco Use  . Smoking status: Never Smoker  . Smokeless tobacco: Never Used  Substance and Sexual Activity  . Alcohol use: Yes    Alcohol/week: 0.0 standard drinks    Comment: occasional 1-2 wine drinks a month  . Drug use: No  . Sexual activity: Yes    Partners: Female  Lifestyle  . Physical activity:    Days per week: Not on file    Minutes per session: Not on file  . Stress: Not on file  Relationships  . Social connections:    Talks on phone: Not on file    Gets together: Not on file    Attends religious service: Not on file    Active member of club or organization: Not on file    Attends meetings of clubs or organizations: Not on file    Relationship status: Not on file  . Intimate partner violence:    Fear of current or ex partner: Not on file    Emotionally abused: Not on file    Physically abused: Not on file    Forced sexual activity: Not on file  Other Topics Concern  . Not on file  Social History Narrative   Exercise--  Total gym    Family History  Problem Relation Age of Onset  . Liver disease Father        cancer  . Hyperlipidemia Father   . Liver cancer Father   . Diabetes Unknown   . Colitis Daughter   . Crohn's disease Daughter   . Colon cancer Neg Hx   . Esophageal cancer Neg Hx   . Rectal cancer Neg Hx   . Stomach cancer Neg Hx     ROS- All systems are reviewed and negative except as per the HPI above  Physical Exam: Vitals:   02/12/18 1330  BP: 134/72  Pulse: 67  Weight: 109.8 kg  Height: 5' 10"  (1.778 m)   Wt Readings from Last 3 Encounters:  02/12/18 109.8 kg  01/29/18 106.6 kg  12/30/17 108.5 kg    Labs: Lab Results  Component Value Date   NA 139 01/29/2018   K 4.5 01/29/2018   CL 105 01/29/2018   CO2 21 (L) 01/29/2018   GLUCOSE 126 (H) 01/29/2018   BUN 23 01/29/2018   CREATININE 1.14 01/29/2018    CALCIUM 9.3 01/29/2018   PHOS 3.5 08/09/2007   MG 2.8 (H) 06/25/2007   Lab Results  Component Value Date   INR 2.34 (H) 03/23/2015   Lab Results  Component Value Date  CHOL 103 10/17/2017   HDL 48 10/17/2017   LDLCALC 41 10/17/2017   TRIG 71 10/17/2017     GEN- The patient is well appearing, alert and oriented x 3 today.   Head- normocephalic, atraumatic Eyes-  Sclera clear, conjunctiva pink Ears- hearing intact Oropharynx- clear Neck- supple, no JVP Lymph- no cervical lymphadenopathy Lungs- Clear to ausculation bilaterally, normal work of breathing Heart- Regular rate and rhythm, no murmurs, rubs or gallops, PMI not laterally displaced GI- soft, NT, ND, + BS Extremities- no clubbing, cyanosis, or edema MS- no significant deformity or atrophy Skin- no rash or lesion Psych- euthymic mood, full affect Neuro- strength and sensation are intact  EKG- Sinus rhythm at 67 bpm, with first degree AV block, IRBBB, qrs int 108 ms, qtc 441 ms TEE- 09/2017-Study Conclusions  - Left ventricle: Systolic function was moderately reduced. The   estimated ejection fraction was in the range of 35% to 40%.   Diffuse hypokinesis. - Aortic valve: No evidence of vegetation. There was trivial   regurgitation. - Aortic root: The aortic root was mildly dilated. - Mitral valve: Prior procedures included surgical repair. The   findings are consistent with mild stenosis. There was mild   regurgitation. - Left atrium: The atrium was severely dilated. No evidence of   thrombus in the atrial cavity or appendage. - Atrial septum: No defect or patent foramen ovale was identified.   There was an atrial septal aneurysm. - Tricuspid valve: No evidence of vegetation. - Pulmonic valve: No evidence of vegetation.  Impressions:  - Moderate global reduction in LV systolic function; mildly dilated   aortic root; trace AI; s/p MV repair with mild MS (mean gradient   5 mmHg) and mild MR; severe LAE; mild  TR.  LHC- 2008-40% left main, 30% LAD, 30% RCA   Assessment and Plan: 1.Atypical atrial flutter Ablation not completed as flutter was  left atrial  flutter   He was successfully cardioverted Antiarrythmic's discussed He is not an candidate for multaq or flecainide as he has CAD and LV dysfunction He is on the young side for amiodarone We discussed Tikosyn or sotalol but he is not in favor of a hospitalization at this point He will discuss further with Dr. Curt Bears 10/15  2. Shortness of breath/fatigue This should be improved with normal rhythm,states not improved  May be 2/2 LV dysfunction and may need to have f/u imaging tests to see if CAD has progressed   F/u with Dr. Curt Bears as scheduled 10/15 Pt plans to get f/u visit with Dr. Rodena Medin C. [place], [facility] [address] [place], [place] [address] [phone]

## 2018-02-20 NOTE — Progress Notes (Signed)
HPI: FU mitral valve repair secondary to severe mitral regurgitation in February 2009. Note preoperative catheterization showed a 40% left main, 30% LAD and 30% right coronary artery. He has also had atrial flutter ablation in March 2009.  Carotid dopplers in August of 2011 were normal. Patient has had elevated LFTs with statins in the past. Nuclear study 10/16 showed EF 49 with normal perfusion.  Echocardiogram October 2018 showed normal LV function, moderate diastolic dysfunction, mild aortic insufficiency, mild mitral stenosis, mild to moderate mitral regurgitation and moderate left atrial enlargement.  TEE/DCCV of atrial flutter May 2019; images showed ejection fraction 35 to 40%, mildly dilated aortic root, mild mitral stenosis and mitral regurgitation; severe left atrial enlargement. Recurrent atrial flutter following procedure.  Had attempt at atrial flutter ablation.  However it was left atrial flutter and therefore patient underwent cardioversion.  He was seen in follow-up in atrial fibrillation clinic and tikosyn recommended.  Since I last saw him,  he notes some dyspnea on exertion.  No orthopnea, PND or pedal edema.  Some chest heaviness that increases with lying flat.  No palpitations or syncope.  Current Outpatient Medications  Medication Sig Dispense Refill  . amoxicillin (AMOXIL) 500 MG capsule Take 2,000 mg by mouth See admin instructions. Take 2000 mg by mouth 1 hour dental procedure    . ANTARA 90 MG CAPS TAKE 1 CAPSULE BY MOUTH EVERY DAY 90 capsule 1  . apixaban (ELIQUIS) 5 MG TABS tablet Take 1 tablet (5 mg total) by mouth 2 (two) times daily. 180 tablet 3  . aspirin 81 MG tablet Take 81 mg by mouth daily.    . Evolocumab (REPATHA SURECLICK) 034 MG/ML SOAJ Inject 140 mg into the skin every 14 (fourteen) days. 6 pen 4  . Icosapent Ethyl 1 g CAPS Take 2 g by mouth 2 (two) times daily. 120 capsule 5  . meloxicam (MOBIC) 15 MG tablet Take 15 mg by mouth daily as needed for pain.     . metoprolol tartrate (LOPRESSOR) 25 MG tablet Take 1 tablet (25 mg total) by mouth 2 (two) times daily. 180 tablet 3  . valsartan (DIOVAN) 80 MG tablet Take 1 tablet (80 mg total) by mouth daily. 90 tablet 3   No current facility-administered medications for this visit.      Past Medical History:  Diagnosis Date  . Atrial flutter ([place])    s/p ablation  . CAD (coronary artery disease)    cath 06/2007 40% LM  . CVD (cerebrovascular disease)    59% right ICA, 49% left ICA; h/oTIA; fu study 8/11 with normal carotids  . Dysrhythmia   . Gait abnormality   . Heart murmur   . History of bronchitis   . History of colonoscopy   . Horseshoe kidney   . Hyperlipidemia   . MR (mitral regurgitation)    severe; mitral valve repair in march 2009  . MVP (mitral valve prolapse)   . Osteoarthritis    both knees  . Shortness of breath dyspnea    increased exertion     Past Surgical History:  Procedure Laterality Date  . A-FLUTTER ABLATION N/A 01/29/2018   Procedure: A-FLUTTER ABLATION;  Surgeon: Constance Haw, MD;  Location: [facility];  Service: Cardiovascular;  Laterality: N/A;  . BACK SURGERY     secondary to ruptured disc  . CARDIAC CATHETERIZATION    . CARDIOVERSION N/A 09/23/2017   Procedure: CARDIOVERSION;  Surgeon: Lelon Perla, MD;  Location: [facility]OSCOPY;  Service: Cardiovascular;  Laterality: N/A;  . heart ablation    . MITRAL VALVE REPAIR  06/2007  . TEE WITHOUT CARDIOVERSION N/A 09/23/2017   Procedure: TRANSESOPHAGEAL ECHOCARDIOGRAM (TEE);  Surgeon: Lelon Perla, MD;  Location: [facility];  Service: Cardiovascular;  Laterality: N/A;  . TONSILLECTOMY  1960  . TOTAL KNEE ARTHROPLASTY Bilateral 03/09/2015   Procedure: TOTAL KNEE BILATERAL;  Surgeon: Gaynelle Arabian, MD;  Location: [facility];  Service: Orthopedics;  Laterality: Bilateral;    Social History   Socioeconomic History  . Marital status: Married    Spouse name: Not on file  . Number of children:  Not on file  . Years of education: Not on file  . Highest education level: Not on file  Occupational History  . Occupation: [employer]: works from home  Social Needs  . Financial resource strain: Not on file  . Food insecurity:    Worry: Not on file    Inability: Not on file  . Transportation needs:    Medical: Not on file    Non-medical: Not on file  Tobacco Use  . Smoking status: Never Smoker  . Smokeless tobacco: Never Used  Substance and Sexual Activity  . Alcohol use: Yes    Alcohol/week: 0.0 standard drinks    Comment: occasional 1-2 wine drinks a month  . Drug use: No  . Sexual activity: Yes    Partners: Female  Lifestyle  . Physical activity:    Days per week: Not on file    Minutes per session: Not on file  . Stress: Not on file  Relationships  . Social connections:    Talks on phone: Not on file    Gets together: Not on file    Attends religious service: Not on file    Active member of club or organization: Not on file    Attends meetings of clubs or organizations: Not on file    Relationship status: Not on file  . Intimate partner violence:    Fear of current or ex partner: Not on file    Emotionally abused: Not on file    Physically abused: Not on file    Forced sexual activity: Not on file  Other Topics Concern  . Not on file  Social History Narrative   Exercise--  Total gym    Family History  Problem Relation Age of Onset  . Liver disease Father        cancer  . Hyperlipidemia Father   . Liver cancer Father   . Diabetes Unknown   . Colitis Daughter   . Crohn's disease Daughter   . Colon cancer Neg Hx   . Esophageal cancer Neg Hx   . Rectal cancer Neg Hx   . Stomach cancer Neg Hx     ROS: no fevers or chills, productive cough, hemoptysis, dysphasia, odynophagia, melena, hematochezia, dysuria, hematuria, rash, seizure activity, orthopnea, PND, pedal edema, claudication. Remaining systems are negative.  Physical  Exam: Well-developed well-nourished in no acute distress.  Skin is warm and dry.  HEENT is normal.  Neck is supple.  Chest is clear to auscultation with normal expansion.  Cardiovascular exam is regular rate and rhythm.  Abdominal exam nontender or distended. No masses palpated. Extremities show no edema. neuro grossly intact  ECG-sinus rhythm at a rate of 70.  First-degree AV block.  Personally reviewed  A/P  1 atrial flutter-patient underwent successful TEE guided cardioversion.  Attempt at  ablation was not successful as it was a left-sided flutter.  Tikosyn was recommended and I think this is appropriate.  He is scheduled to see Dr. Curt Bears in the near future and this can be discussed further at that time and arrangements made as needed.  Continue apixaban.  Given need for anticoagulation would discontinue aspirin.  2 prior mitral valve repair-continue SBE prophylaxis.  3 cardiomyopathy-LV function reduced recently.  We will plan to repeat echocardiogram 3 months after sinus reestablished.  Hopefully this will have improved as I think it may have been related to tachycardia.  Continue ARB and beta-blocker.  4 obesity-we discussed the importance of weight loss.  5 hyperlipidemia-discontinue antara.  Continue Repatha.  In 4 weeks check lipids and liver.  Kirk Ruths, MD

## 2018-02-25 ENCOUNTER — Ambulatory Visit: Payer: 59 | Admitting: Cardiology

## 2018-02-25 ENCOUNTER — Encounter: Payer: Self-pay | Admitting: Cardiology

## 2018-02-25 VITALS — BP 132/74 | HR 70 | Ht 70.0 in | Wt 241.6 lb

## 2018-02-25 DIAGNOSIS — Z79899 Other long term (current) drug therapy: Secondary | ICD-10-CM

## 2018-02-25 DIAGNOSIS — I420 Dilated cardiomyopathy: Secondary | ICD-10-CM | POA: Diagnosis not present

## 2018-02-25 DIAGNOSIS — I484 Atypical atrial flutter: Secondary | ICD-10-CM | POA: Diagnosis not present

## 2018-02-25 NOTE — Patient Instructions (Signed)
Medication Instructions:  Your Physician recommend you make the following changes to your medication. Stop: Aspirin 81 mg          Antara 90 mg  If you need a refill on your cardiac medications before your next appointment, please call your pharmacy.   Lab work: Your physician recommends that you return for lab work in 4 weeks ( Lipid, LFT)  If you have labs (blood work) drawn today and your tests are completely normal, you will receive your results only by: Marland Kitchen MyChart Message (if you have MyChart) OR . A paper copy in the mail If you have any lab test that is abnormal or we need to change your treatment, we will call you to review the results.  Testing/Procedures: None   Follow-Up: At [facility], you and your health needs are our priority.  As part of our continuing mission to provide you with exceptional heart care, we have created designated Provider Care Teams.  These Care Teams include your primary Cardiologist (physician) and Advanced Practice Providers (APPs -  Physician Assistants and Nurse Practitioners) who all work together to provide you with the care you need, when you need it. You will need a follow up appointment in 3 months.  Please call our office 2 months in advance to schedule this appointment.  You may see Kirk Ruths, MD or one of the following Advanced Practice Providers on your designated Care Team:   Kerin Ransom, PA-C Roby Lofts, [place] . Sande Rives, PA-C  Any Other Special Instructions Will Be Listed Below (If Applicable).

## 2018-03-04 ENCOUNTER — Encounter: Payer: Self-pay | Admitting: Cardiology

## 2018-03-04 ENCOUNTER — Ambulatory Visit: Payer: 59 | Admitting: Cardiology

## 2018-03-04 VITALS — BP 132/84 | HR 66 | Ht 70.0 in | Wt 241.0 lb

## 2018-03-04 DIAGNOSIS — I10 Essential (primary) hypertension: Secondary | ICD-10-CM

## 2018-03-04 DIAGNOSIS — I5022 Chronic systolic (congestive) heart failure: Secondary | ICD-10-CM | POA: Diagnosis not present

## 2018-03-04 DIAGNOSIS — I2510 Atherosclerotic heart disease of native coronary artery without angina pectoris: Secondary | ICD-10-CM | POA: Diagnosis not present

## 2018-03-04 DIAGNOSIS — I484 Atypical atrial flutter: Secondary | ICD-10-CM

## 2018-03-04 MED ORDER — VALSARTAN 160 MG PO TABS: 160.0000 mg | ORAL_TABLET | Freq: Every day | ORAL | 11 refills | Status: DC

## 2018-03-04 NOTE — Progress Notes (Signed)
Electrophysiology Office Note   Date:  03/04/2018   ID:  [name], DOB 1951-01-05, MRN [id number]  PCP:  Carollee Herter, Alferd Apa, DO  Cardiologist:  Stanford Breed Primary Electrophysiologist:  Telsa Dillavou Meredith Leeds, MD    No chief complaint on file.    History of Present Illness: [name] is a 67 y.o. male who is being seen today for the evaluation of atrial flutter at the request of Fabian Sharp. Presenting today for electrophysiology evaluation.  Is a history of CAD, atrial flutter status post ablation, mitral regurgitation status post repair in 2009.  He had his atrial flutter ablation in 2009.  He was seen 08/2017 in atrial flutter with heart rates of 126.  He was referred for cardioversion.  He is currently on Eliquis and metoprolol.  Recent TEE showed an EF of 35 to 40% which is a reduction from his prior echo in 2018.  He presented to cardiology clinic 12/12/2017 complaining of palpitations.  EKG showed atrial flutter.  Heart rates have been up to 150 bpm.  He is also had heart rates in the 50s at times.  His main complaints are weakness, fatigue, and shortness of breath.  He also has palpitations and a general uneasy feeling.  He has difficulty doing his daily activities without fatigue.  He also takes quite a few more naps than usual. Had EP study 01/29/18 which showed a LA flutter. He was cardioverted at that time.  Today, denies symptoms of palpitations, chest pain, shortness of breath, orthopnea, PND, lower extremity edema, claudication, dizziness, presyncope, syncope, bleeding, or neurologic sequela. The patient is tolerating medications without difficulties.     Past Medical History:  Diagnosis Date  . Atrial flutter ([place])    s/p ablation  . CAD (coronary artery disease)    cath 06/2007 40% LM  . CVD (cerebrovascular disease)    59% right ICA, 49% left ICA; h/oTIA; fu study 8/11 with normal carotids  . Dysrhythmia   . Gait abnormality   . Heart murmur   . History of  bronchitis   . History of colonoscopy   . Horseshoe kidney   . Hyperlipidemia   . MR (mitral regurgitation)    severe; mitral valve repair in march 2009  . MVP (mitral valve prolapse)   . Osteoarthritis    both knees  . Shortness of breath dyspnea    increased exertion    Past Surgical History:  Procedure Laterality Date  . A-FLUTTER ABLATION N/A 01/29/2018   Procedure: A-FLUTTER ABLATION;  Surgeon: Constance Haw, MD;  Location: [facility];  Service: Cardiovascular;  Laterality: N/A;  . BACK SURGERY     secondary to ruptured disc  . CARDIAC CATHETERIZATION    . CARDIOVERSION N/A 09/23/2017   Procedure: CARDIOVERSION;  Surgeon: Lelon Perla, MD;  [facility];  Service: Cardiovascular;  Laterality: N/A;  . heart ablation    . MITRAL VALVE REPAIR  06/2007  . TEE WITHOUT CARDIOVERSION N/A 09/23/2017   Procedure: TRANSESOPHAGEAL ECHOCARDIOGRAM (TEE);  Surgeon: Lelon Perla, MD;  Location: [facility];  Service: Cardiovascular;  Laterality: N/A;  . TONSILLECTOMY  1960  . TOTAL KNEE ARTHROPLASTY Bilateral 03/09/2015   Procedure: TOTAL KNEE BILATERAL;  Surgeon: Gaynelle Arabian, MD;  Location: [facility];  Service: Orthopedics;  Laterality: Bilateral;     Current Outpatient Medications  Medication Sig Dispense Refill  . amoxicillin (AMOXIL) 500 MG capsule Take 2,000 mg by mouth See admin instructions. Take 2000 mg  by mouth 1 hour dental procedure    . apixaban (ELIQUIS) 5 MG TABS tablet Take 1 tablet (5 mg total) by mouth 2 (two) times daily. 180 tablet 3  . Evolocumab (REPATHA SURECLICK) 371 MG/ML SOAJ Inject 140 mg into the skin every 14 (fourteen) days. 6 pen 4  . Icosapent Ethyl 1 g CAPS Take 2 g by mouth 2 (two) times daily. 120 capsule 5  . meloxicam (MOBIC) 15 MG tablet Take 15 mg by mouth daily as needed for pain.    . metoprolol tartrate (LOPRESSOR) 25 MG tablet Take 1 tablet (25 mg total) by mouth 2 (two) times daily. 180 tablet 3  . valsartan (DIOVAN)  80 MG tablet Take 1 tablet (80 mg total) by mouth daily. 90 tablet 3   No current facility-administered medications for this visit.     Allergies:   Statins; Codeine; and Tape   Social History:  The patient  reports that he has never smoked. He has never used smokeless tobacco. He reports that he drinks alcohol. He reports that he does not use drugs.   Family History:  The patient's family history includes Colitis in his daughter; Crohn's disease in his daughter; Diabetes in his unknown relative; Hyperlipidemia in his father; Liver cancer in his father; Liver disease in his father.    ROS:  Please see the history of present illness.   Otherwise, review of systems is positive for none.   All other systems are reviewed and negative.   PHYSICAL EXAM: VS:  BP 132/84   Pulse 66   Ht 5' 10"  (1.778 m)   Wt 241 lb (109.3 kg)   BMI 34.58 kg/m  , BMI Body mass index is 34.58 kg/m. GEN: Well nourished, well developed, in no acute distress  HEENT: normal  Neck: no JVD, carotid bruits, or masses Cardiac: RRR; no murmurs, rubs, or gallops,no edema  Respiratory:  clear to auscultation bilaterally, normal work of breathing GI: soft, nontender, nondistended, + BS MS: no deformity or atrophy  Skin: warm and dry Neuro:  Strength and sensation are intact Psych: euthymic mood, full affect  EKG:  EKG is ordered today. Personal review of the ekg ordered shows this rhythm, first-degree AV block, rate 66   Recent Labs: 10/17/2017: ALT 69 12/12/2017: TSH 2.040 01/29/2018: BUN 23; Creatinine, Ser 1.14; Hemoglobin 15.4; Platelets 201; Potassium 4.5; Sodium 139    Lipid Panel     Component Value Date/Time   CHOL 103 10/17/2017 0906   TRIG 71 10/17/2017 0906   HDL 48 10/17/2017 0906   CHOLHDL 2.1 10/17/2017 0906   CHOLHDL 3 07/15/2017 0855   VLDL 18.0 07/15/2017 0855   LDLCALC 41 10/17/2017 0906   LDLDIRECT 147.0 01/16/2016 0908     Wt Readings from Last 3 Encounters:  03/04/18 241 lb (109.3  kg)  02/25/18 241 lb 9.6 oz (109.6 kg)  02/12/18 242 lb (109.8 kg)      Other studies Reviewed: Additional studies/ records that were reviewed today include: TEE 09/23/17  Review of the above records today demonstrates:  - Left ventricle: Systolic function was moderately reduced. The   estimated ejection fraction was in the range of 35% to 40%.   Diffuse hypokinesis. - Aortic valve: No evidence of vegetation. There was trivial   regurgitation. - Aortic root: The aortic root was mildly dilated. - Mitral valve: Prior procedures included surgical repair. The   findings are consistent with mild stenosis. There was mild   regurgitation. - Left atrium:  The atrium was severely dilated. No evidence of   thrombus in the atrial cavity or appendage. - Atrial septum: No defect or patent foramen ovale was identified.   There was an atrial septal aneurysm. - Tricuspid valve: No evidence of vegetation. - Pulmonic valve: No evidence of vegetation.  TTE 02/19/17 - Left ventricle: The cavity size was normal. Wall thickness was   normal. Systolic function was normal. The estimated ejection   fraction was 55%. Wall motion was normal; there were no regional   wall motion abnormalities. Features are consistent with a   pseudonormal left ventricular filling pattern, with concomitant   abnormal relaxation and increased filling pressure (grade 2   diastolic dysfunction). - Aortic valve: There was mild regurgitation. - Mitral valve: Noncalcified, moderately thickened annulus. Mildly   thickened leaflets . Thickening. The findings are consistent with   mild stenosis. There was mild to moderate regurgitation directed   eccentrically. Valve area by pressure half-time: 1.85 cm^2. Valve   area by continuity equation (using LVOT flow): 1.8 cm^2.   following mitral valve repair. - Left atrium: The atrium was moderately dilated. - Dilated IVC with variation.  ASSESSMENT AND PLAN:  1.  Atypical atrial  flutter: Initially was thought to be typical based on his EKG.  Was left atrial during EP study.  Had a cardioversion which he tolerated well.  He is in sinus rhythm today.  I talked him about the possibility of antiarrhythmics including Tigas and.  At this point he would like to see how he does moving forward.  Should he go back out of rhythm, Tikosyn would be the best medication.  This patients CHA2DS2-VASc Score and unadjusted Ischemic Stroke Rate (% per year) is equal to 3.2 % stroke rate/year from a score of 3  Above score calculated as 1 point each if present [CHF, HTN, DM, Vascular=MI/PAD/Aortic Plaque, Age if 65-74, or Male] Above score calculated as 2 points each if present [Age > 75, or Stroke/TIA/TE]    2.  Hypertension: Mildly elevated.  Advith Martine increase Diovan  3.  Chronic systolic heart failure: Possibly related to his rapid rates.  He Inge Waldroup need an echo at the next visit.  4.  Coronary artery disease: Mild disease.  No current chest pain.  Current medicines are reviewed at length with the patient today.   The patient does not have concerns regarding his medicines.  The following changes were made today: Increase Diovan  Labs/ tests ordered today include:  Orders Placed This Encounter  Procedures  . EKG 12-Lead     Disposition:   FU with Jennet Scroggin 6 months  Signed, Myrth Dahan Meredith Leeds, MD  03/04/2018 10:52 AM     [facility] [address] [place] [address] [phone] (office) [phone] (fax)

## 2018-03-04 NOTE — Patient Instructions (Addendum)
Medication Instructions:  Your physician has recommended you make the following change in your medication: 1. INCREASE Diovan to 160 mg once daily  If you need a refill on your cardiac medications before your next appointment, please call your pharmacy.   Lab work: None ordered  Testing/Procedures: None ordered  Follow-Up: At [facility], you and your health needs are our priority.  As part of our continuing mission to provide you with exceptional heart care, we have created designated Provider Care Teams.  These Care Teams include your primary Cardiologist (physician) and Advanced Practice Providers (APPs -  Physician Assistants and Nurse Practitioners) who all work together to provide you with the care you need, when you need it. You will need a follow up appointment in 6 months.  Please call our office 2 months in advance to schedule this appointment.  You may see Will Meredith Leeds, MD or one of the following Advanced Practice Providers on your designated Care Team:   Chanetta Marshall, NP . Tommye Standard, PA-C  Thank you for choosing [facility]!!   Trinidad Curet, RN [phone]

## 2018-03-06 ENCOUNTER — Other Ambulatory Visit: Payer: Self-pay | Admitting: Cardiology

## 2018-03-06 NOTE — Telephone Encounter (Signed)
[place] for repatha [employer]

## 2018-03-07 ENCOUNTER — Other Ambulatory Visit: Payer: Self-pay | Admitting: Cardiology

## 2018-03-07 NOTE — Telephone Encounter (Signed)
See highlighted note on refill request about med being on backorder. Thanks, MI

## 2018-03-07 NOTE — Telephone Encounter (Signed)
Ok to fill Telmisartan. Please update pt medications

## 2018-03-17 ENCOUNTER — Other Ambulatory Visit: Payer: Self-pay | Admitting: Cardiology

## 2018-03-17 ENCOUNTER — Other Ambulatory Visit: Payer: Self-pay | Admitting: Family Medicine

## 2018-03-17 ENCOUNTER — Ambulatory Visit: Payer: 59 | Admitting: Physician Assistant

## 2018-03-17 DIAGNOSIS — M170 Bilateral primary osteoarthritis of knee: Secondary | ICD-10-CM

## 2018-03-17 DIAGNOSIS — E785 Hyperlipidemia, unspecified: Secondary | ICD-10-CM

## 2018-03-17 NOTE — Telephone Encounter (Signed)
Rx request sent to pharmacy.  

## 2018-03-18 NOTE — Telephone Encounter (Signed)
How often is he taking mobic?  Interacts with eliquis.

## 2018-03-18 NOTE — Telephone Encounter (Signed)
Do you want to refill patient is also taking eliquis?

## 2018-03-21 NOTE — Telephone Encounter (Signed)
Pt calling to check. Would like a call back.

## 2018-03-28 ENCOUNTER — Ambulatory Visit (INDEPENDENT_AMBULATORY_CARE_PROVIDER_SITE_OTHER): Payer: 59

## 2018-03-28 DIAGNOSIS — Z23 Encounter for immunization: Secondary | ICD-10-CM

## 2018-03-29 LAB — LIPID PANEL
CHOL/HDL RATIO: 3 ratio (ref 0.0–5.0)
Cholesterol, Total: 153 mg/dL (ref 100–199)
HDL: 51 mg/dL (ref >39)
LDL CALC: 81 mg/dL (ref 0–99)
TRIGLYCERIDES: 103 mg/dL (ref 0–149)
VLDL Cholesterol Cal: 21 mg/dL (ref 5–40)

## 2018-03-29 LAB — HEPATIC FUNCTION PANEL
ALT: 107 IU/L — AB (ref 0–44)
AST: 50 IU/L — AB (ref 0–40)
Albumin: 4.9 g/dL — ABNORMAL HIGH (ref 3.6–4.8)
Alkaline Phosphatase: 53 IU/L (ref 39–117)
Bilirubin Total: 0.7 mg/dL (ref 0.0–1.2)
Bilirubin, Direct: 0.18 mg/dL (ref 0.00–0.40)
Total Protein: 7 g/dL (ref 6.0–8.5)

## 2018-04-04 ENCOUNTER — Telehealth: Payer: Self-pay | Admitting: *Deleted

## 2018-04-04 ENCOUNTER — Ambulatory Visit (INDEPENDENT_AMBULATORY_CARE_PROVIDER_SITE_OTHER): Payer: 59 | Admitting: Gastroenterology

## 2018-04-04 ENCOUNTER — Encounter: Payer: Self-pay | Admitting: Gastroenterology

## 2018-04-04 VITALS — BP 128/84 | HR 74 | Ht 69.0 in | Wt 244.2 lb

## 2018-04-04 DIAGNOSIS — Z7901 Long term (current) use of anticoagulants: Secondary | ICD-10-CM

## 2018-04-04 DIAGNOSIS — Z86010 Personal history of colonic polyps: Secondary | ICD-10-CM

## 2018-04-04 MED ORDER — PEG-KCL-NACL-NASULF-NA ASC-C 140 G PO SOLR: 1.0000 | Freq: Once | ORAL | 0 refills | Status: AC

## 2018-04-04 NOTE — Telephone Encounter (Signed)
[facility] Pre-operative Risk Assessment     Request for surgical clearance:     Endoscopy Procedure  What type of surgery is being performed?     Colonoscopy  When is this surgery scheduled?     05-06-2018  What type of clearance is required ?   Pharmacy  Are there any medications that need to be held prior to surgery and how long? Eliquis- we typically hold this medication 2 days prior to the procedure date.   Practice name and name of physician performing surgery?      [facility]  What is your office phone and fax number?      Phone- [phone]  [phone]  Anesthesia type (None, local, MAC, general) ?       MAC

## 2018-04-04 NOTE — Progress Notes (Signed)
04/04/2018 [name] [id number] 11/21/1950   HISTORY OF PRESENT ILLNESS: This is a 67 year old male who is previously known to Dr. Sharlett Iles.  He has not been seen here since his last colonoscopy in November 2013.  At that time he was found to have one flat polyp that was removed and was a tubular adenoma pathology.  Repeat colonoscopy was recommended in November 2018.  Is here today to discuss the procedure.  He is on Eliquis for history of atrial flutter.  He had a cardioversion in September and has been doing well.  He follows with Dr. Stanford Breed for those issues.  He denies any GI complaints including change in bowel habits, rectal bleeding, abdominal pain.   Past Medical History:  Diagnosis Date  . Atrial flutter ([place])    s/p ablation  . CAD (coronary artery disease)    cath 06/2007 40% LM  . CVD (cerebrovascular disease)    59% right ICA, 49% left ICA; h/oTIA; fu study 8/11 with normal carotids  . Dysrhythmia   . Gait abnormality   . Heart murmur   . History of bronchitis   . History of colonoscopy   . Horseshoe kidney   . Hyperlipidemia   . MR (mitral regurgitation)    severe; mitral valve repair in march 2009  . MVP (mitral valve prolapse)   . Osteoarthritis    both knees  . Shortness of breath dyspnea    increased exertion    Past Surgical History:  Procedure Laterality Date  . A-FLUTTER ABLATION N/A 01/29/2018   Procedure: A-FLUTTER ABLATION;  Surgeon: Constance Haw, MD;  Location: [place] [facility];  Service: Cardiovascular;  Laterality: N/A;  . BACK SURGERY     secondary to ruptured disc  . CARDIAC CATHETERIZATION    . CARDIOVERSION N/A 09/23/2017   Procedure: CARDIOVERSION;  Surgeon: Lelon Perla, MD;  Location: [facility] ENDOSCOPY;  Service: Cardiovascular;  Laterality: N/A;  . heart ablation    . MITRAL VALVE REPAIR  06/2007  . TEE WITHOUT CARDIOVERSION N/A 09/23/2017   Procedure: TRANSESOPHAGEAL ECHOCARDIOGRAM (TEE);  Surgeon: Lelon Perla,  MD;  Location: [place];  Service: Cardiovascular;  Laterality: N/A;  . TONSILLECTOMY  1960  . TOTAL KNEE ARTHROPLASTY Bilateral 03/09/2015   Procedure: TOTAL KNEE BILATERAL;  Surgeon: Gaynelle Arabian, MD;  [facility];  Service: Orthopedics;  Laterality: Bilateral;    reports that he has never smoked. He has never used smokeless tobacco. He reports that he drinks alcohol. He reports that he does not use drugs. family history includes Colitis in his daughter; Crohn's disease in his daughter; Diabetes in his unknown relative; Hyperlipidemia in his father; Liver cancer in his father; Liver disease in his father. Allergies  Allergen Reactions  . Statins Other (See Comments)    Messes with liver function  . Codeine Nausea Only  . Tape Rash      Outpatient Encounter Medications as of 04/04/2018  Medication Sig  . amoxicillin (AMOXIL) 500 MG capsule Take 2,000 mg by mouth See admin instructions. Take 2000 mg by mouth 1 hour dental procedure  . apixaban (ELIQUIS) 5 MG TABS tablet Take 1 tablet (5 mg total) by mouth 2 (two) times daily.  . Evolocumab (REPATHA) 140 MG/ML SOSY Inject into the skin. Injection every 2 weeks  . Icosapent Ethyl 1 g CAPS Take 2 g by mouth 2 (two) times daily.  . meloxicam (MOBIC) 15 MG tablet Take 15 mg by mouth daily as needed  for pain.  . metoprolol tartrate (LOPRESSOR) 25 MG tablet Take 1 tablet (25 mg total) by mouth 2 (two) times daily.  Marland Kitchen telmisartan (MICARDIS) 40 MG tablet Take 1 tablet (40 mg total) by mouth daily.  Marland Kitchen VASCEPA 1 g CAPS TAKE 2 CAPSULES BY MOUTH 2 (TWO) TIMES DAILY.  . [DISCONTINUED] Evolocumab (REPATHA SURECLICK) 161 MG/ML SOAJ Inject 140 mg into the skin every 14 (fourteen) days.  . [DISCONTINUED] meloxicam (MOBIC) 15 MG tablet TAKE 1 TABLET BY MOUTH EVERY DAY   No facility-administered encounter medications on file as of 04/04/2018.      REVIEW OF SYSTEMS  : All other systems reviewed and negative except where noted in the History of  Present Illness.   PHYSICAL EXAM: BP 128/84   Pulse 74   Ht 5' 9"  (1.753 m)   Wt 244 lb 4 oz (110.8 kg)   BMI 36.07 kg/m  General: Well developed white male in no acute distress Head: Normocephalic and atraumatic Eyes:  Sclerae anicteric, conjunctiva pink. Ears: Normal auditory acuity Lungs: Clear throughout to auscultation; no increased WOB. Heart: Regular rate and rhythm; no M/R/G. Abdomen: Soft, non-distended.  BS present.  Non-tender. Rectal:  Will be done at the time of colonoscopy. Musculoskeletal: Symmetrical with no gross deformities  Skin: No lesions on visible extremities Extremities: No edema  Neurological: Alert oriented x 4, grossly non-focal Psychological:  Alert and cooperative. Normal mood and affect  ASSESSMENT AND PLAN: *Personal history of colon polyps:  Last colonoscopy 03/2012 at which time he had a tubular adenoma removed.  Was due in 03/2017.  Will schedule with Dr. Silverio Decamp. *Chronic anticoagulation with Eliquis due to history of atrial flutter, had cardioversion in September and has been doing well since that time:  Will hold Eliquis for 2 days prior to endoscopic procedures - will instruct when and how to resume after procedure. Benefits and risks of procedure explained including risks of bleeding, perforation, infection, missed lesions, reactions to medications and possible need for hospitalization and surgery for complications. Additional rare but real risk of stroke or other vascular clotting events off Eliquis also explained and need to seek urgent help if any signs of these problems occur. Will communicate by phone or EMR with patient's prescribing provider, Dr. Stanford Breed, to confirm that holding Eliquis is reasonable in this case.    CC:  Ann Held, *

## 2018-04-04 NOTE — Patient Instructions (Signed)
We will call you with the Eliquis clearance instructions from Dr. Stanford Breed.   You have been scheduled for a colonoscopy. Please follow written instructions given to you at your visit today.  Please pick up your prep supplies at the pharmacy within the next 1-3 days. If you use inhalers (even only as needed), please bring them with you on the day of your procedure.  Normal BMI (Body Mass Index- based on height and weight) is between 19 and 25. Your BMI today is Body mass index is 36.07 kg/m. Marland Kitchen Please consider follow up  regarding your BMI with your Primary Care Provider.

## 2018-04-04 NOTE — Telephone Encounter (Signed)
Ok to hold apixaban 2 days prior to procedure and resume after when ok with GI Kirk Ruths

## 2018-04-07 NOTE — Telephone Encounter (Signed)
Called the patient to advise that Dr. Stanford Breed said he can hold the Eliquis on 12-15, 12-16th, and 12 17.  I told him Dr. Silverio Decamp will tell him when to restart the Eliquis, which will more than likely be the next day after the procedure.

## 2018-04-10 NOTE — Progress Notes (Signed)
Reviewed and agree with documentation and assessment and plan. K. Veena Nandigam , MD   

## 2018-04-24 ENCOUNTER — Encounter: Payer: Self-pay | Admitting: Gastroenterology

## 2018-05-06 ENCOUNTER — Encounter: Payer: Self-pay | Admitting: Gastroenterology

## 2018-05-06 ENCOUNTER — Ambulatory Visit (AMBULATORY_SURGERY_CENTER): Payer: 59 | Admitting: Gastroenterology

## 2018-05-06 VITALS — BP 121/70 | HR 57 | Temp 98.6°F | Resp 16 | Ht 69.0 in | Wt 244.0 lb

## 2018-05-06 DIAGNOSIS — Z86010 Personal history of colonic polyps: Secondary | ICD-10-CM | POA: Diagnosis not present

## 2018-05-06 DIAGNOSIS — D125 Benign neoplasm of sigmoid colon: Secondary | ICD-10-CM | POA: Diagnosis not present

## 2018-05-06 DIAGNOSIS — D124 Benign neoplasm of descending colon: Secondary | ICD-10-CM

## 2018-05-06 DIAGNOSIS — D123 Benign neoplasm of transverse colon: Secondary | ICD-10-CM | POA: Diagnosis not present

## 2018-05-06 MED ORDER — SODIUM CHLORIDE 0.9 % IV SOLN: 500.0000 mL | Freq: Once | INTRAVENOUS | Status: DC

## 2018-05-06 NOTE — Progress Notes (Signed)
Pt's states no medical or surgical changes since previsit or office visit. 

## 2018-05-06 NOTE — Patient Instructions (Addendum)
Information on polyps, diverticulosis and hemorrhoids given to you today.  Await pathology report.   Resume Eliquis tomorrow @ prior dose    YOU HAD AN ENDOSCOPIC PROCEDURE TODAY AT [place]:   Refer to the procedure report that was given to you for any specific questions about what was found during the examination.  If the procedure report does not answer your questions, please call your gastroenterologist to clarify.  If you requested that your care partner not be given the details of your procedure findings, then the procedure report has been included in a sealed envelope for you to review at your convenience later.  YOU SHOULD EXPECT: Some feelings of bloating in the abdomen. Passage of more gas than usual.  Walking can help get rid of the air that was put into your GI tract during the procedure and reduce the bloating. If you had a lower endoscopy (such as a colonoscopy or flexible sigmoidoscopy) you may notice spotting of blood in your stool or on the toilet paper. If you underwent a bowel prep for your procedure, you may not have a normal bowel movement for a few days.  Please Note:  You might notice some irritation and congestion in your nose or some drainage.  This is from the oxygen used during your procedure.  There is no need for concern and it should clear up in a day or so.  SYMPTOMS TO REPORT IMMEDIATELY:   Following lower endoscopy (colonoscopy or flexible sigmoidoscopy):  Excessive amounts of blood in the stool  Significant tenderness or worsening of abdominal pains  Swelling of the abdomen that is new, acute  Fever of 100F or higher   For urgent or emergent issues, a gastroenterologist can be reached at any hour by calling [phone].   DIET:  We do recommend a small meal at first, but then you may proceed to your regular diet.  Drink plenty of fluids but you should avoid alcoholic beverages for 24 hours.  ACTIVITY:  You should plan to take it  easy for the rest of today and you should NOT DRIVE or use heavy machinery until tomorrow (because of the sedation medicines used during the test).    FOLLOW UP: Our staff will call the number listed on your records the next business day following your procedure to check on you and address any questions or concerns that you may have regarding the information given to you following your procedure. If we do not reach you, we will leave a message.  However, if you are feeling well and you are not experiencing any problems, there is no need to return our call.  We will assume that you have returned to your regular daily activities without incident.  If any biopsies were taken you will be contacted by phone or by letter within the next 1-3 weeks.  Please call us at [phone] if you have not heard about the biopsies in 3 weeks.    SIGNATURES/CONFIDENTIALITY: You and/or your care partner have signed paperwork which will be entered into your electronic medical record.  These signatures attest to the fact that that the information above on your After Visit Summary has been reviewed and is understood.  Full responsibility of the confidentiality of this discharge information lies with you and/or your care-partner.

## 2018-05-06 NOTE — Op Note (Addendum)
[place] Patient Name: [name] Procedure Date: 05/06/2018 1:31 PM MRN: [id number] Endoscopist: Mauri Pole , MD Age: 67 Referring MD:  Date of Birth: 1951-04-08 Gender: Male Account #: [id number] Procedure:                Colonoscopy Indications:              Surveillance: Personal history of adenomatous                            polyps on last colonoscopy > 5 years ago Medicines:                Monitored Anesthesia Care Procedure:                Pre-Anesthesia Assessment:                           - Prior to the procedure, a History and Physical                            was performed, and patient medications and                            allergies were reviewed. The patient's tolerance of                            previous anesthesia was also reviewed. The risks                            and benefits of the procedure and the sedation                            options and risks were discussed with the patient.                            All questions were answered, and informed consent                            was obtained. Prior Anticoagulants: The patient                            last took Eliquis (apixaban) 2 days prior to the                            procedure. ASA Grade Assessment: III - A patient                            with severe systemic disease. After reviewing the                            risks and benefits, the patient was deemed in                            satisfactory condition to undergo the procedure.  After obtaining informed consent, the colonoscope                            was passed under direct vision. Throughout the                            procedure, the patient's blood pressure, pulse, and                            oxygen saturations were monitored continuously. The                            Model PCF-[id number]) scope was introduced                            through the  anus and advanced to the the cecum,                            identified by appendiceal orifice and ileocecal                            valve. The colonoscopy was performed without                            difficulty. The patient tolerated the procedure                            well. The quality of the bowel preparation was                            adequate. The appendiceal orifice, and rectum were                            photographed. The ileocecal valve photograph was                            not saved in error. Scope In: 1:46:48 PM Scope Out: 2:06:08 PM Scope Withdrawal Time: 0 hours 12 minutes 52 seconds  Total Procedure Duration: 0 hours 19 minutes 20 seconds  Findings:                 The perianal and digital rectal examinations were                            normal.                           A 5 mm polyp was found in the sigmoid colon. The                            polyp was sessile. The polyp was removed with a                            cold snare. Resection and retrieval were complete.  Two sessile polyps were found in the transverse                            colon. The polyps were 1 to 2 mm in size. These                            polyps were removed with a cold biopsy forceps.                            Resection and retrieval were complete.                           Scattered small-mouthed diverticula were found in                            the sigmoid colon and descending colon.                           Non-bleeding internal hemorrhoids were found during                            retroflexion. The hemorrhoids were small. Complications:            No immediate complications. Estimated Blood Loss:     Estimated blood loss was minimal. Impression:               - One 5 mm polyp in the sigmoid colon, removed with                            a cold snare. Resected and retrieved.                           - Two 1 to 2 mm polyps in the  transverse colon,                            removed with a cold biopsy forceps. Resected and                            retrieved.                           - Diverticulosis in the sigmoid colon and in the                            descending colon.                           - Non-bleeding internal hemorrhoids. Recommendation:           - Patient has a contact number available for                            emergencies. The signs and symptoms of potential  delayed complications were discussed with the                            patient. Return to normal activities tomorrow.                            Written discharge instructions were provided to the                            patient.                           - Resume previous diet.                           - Continue present medications.                           - Await pathology results.                           - Repeat colonoscopy in 3 - 5 years for                            surveillance based on pathology results.                           - Resume Eliquis (apixaban) at prior dose tomorrow.                            Refer to managing physician for further adjustment                            of therapy. Mauri Pole, MD 05/06/2018 2:11:41 PM This report has been signed electronically.

## 2018-05-06 NOTE — Progress Notes (Signed)
Report to PACU, RN, vss, BBS= Clear.  

## 2018-05-06 NOTE — Progress Notes (Signed)
Called to room to assist during endoscopic procedure.  Patient ID and intended procedure confirmed with present staff. Received instructions for my participation in the procedure from the performing physician.  

## 2018-05-07 ENCOUNTER — Telehealth: Payer: Self-pay

## 2018-05-07 NOTE — Telephone Encounter (Signed)
  Follow up Call-  Call back number 05/06/2018  Post procedure Call Back phone  # [phone]  Permission to leave phone message Yes  Some recent data might be hidden     Patient questions:  Do you have a fever, pain , or abdominal swelling? No. Pain Score  0 *  Have you tolerated food without any problems? Yes.    Have you been able to return to your normal activities? Yes.    Do you have any questions about your discharge instructions: Diet   No. Medications  No. Follow up visit  No.  Do you have questions or concerns about your Care? No.  Actions: * If pain score is 4 or above: No action needed, pain <4.

## 2018-05-12 ENCOUNTER — Encounter: Payer: Self-pay | Admitting: Gastroenterology

## 2018-05-25 NOTE — Progress Notes (Signed)
HPI: FU mitral valve repair secondary to severe mitral regurgitation in February 2009. Note preoperative catheterization showed a 40% left main, 30% LAD and 30% right coronary artery. He has also had atrial flutter ablation in March 2009.  Carotid dopplers in August of 2011 were normal. Patient has had elevated LFTs with statins in the past. Nuclear study 10/16 showed EF 49 with normal perfusion.  Echocardiogram October 2018 showed normal LV function, moderate diastolic dysfunction, mild aortic insufficiency, mild mitral stenosis, mild to moderate mitral regurgitation and moderate left atrial enlargement.  TEE/DCCV of atrial flutter May 2019; images showed ejection fraction 35 to 40%, mildly dilated aortic root, mild mitral stenosis and mitral regurgitation; severe left atrial enlargement. Recurrent atrial flutter following procedure.  Had attempt at atrial flutter ablation.  However it was left atrial flutter and therefore patient underwent cardioversion.  He was seen in follow-up in atrial fibrillation clinic and tikosyn recommended.  Saw Dr Curt Bears 10/19 and pt decided to pursue antiarrhythmic only if atrial arrythmias recurred. Since I last saw him,he has mild dyspnea on exertion but no orthopnea, PND, palpitations, syncope or exertional chest pain.  Current Outpatient Medications  Medication Sig Dispense Refill  . amoxicillin (AMOXIL) 500 MG capsule Take 2,000 mg by mouth See admin instructions. Take 2000 mg by mouth 1 hour dental procedure    . apixaban (ELIQUIS) 5 MG TABS tablet Take 1 tablet (5 mg total) by mouth 2 (two) times daily. 180 tablet 3  . Evolocumab (REPATHA) 140 MG/ML SOSY Inject into the skin. Injection every 2 weeks    . meloxicam (MOBIC) 15 MG tablet Take 15 mg by mouth daily as needed for pain.    . metoprolol tartrate (LOPRESSOR) 25 MG tablet Take 1 tablet (25 mg total) by mouth 2 (two) times daily. 180 tablet 3  . telmisartan (MICARDIS) 40 MG tablet Take 1 tablet (40 mg  total) by mouth daily. 90 tablet 3  . VASCEPA 1 g CAPS TAKE 2 CAPSULES BY MOUTH 2 (TWO) TIMES DAILY. 360 capsule 1   No current facility-administered medications for this visit.      Past Medical History:  Diagnosis Date  . Atrial flutter ([place])    s/p ablation  . CAD (coronary artery disease)    cath 06/2007 40% LM  . CVD (cerebrovascular disease)    59% right ICA, 49% left ICA; h/oTIA; fu study 8/11 with normal carotids  . Dysrhythmia   . Gait abnormality   . Heart murmur   . History of bronchitis   . History of colonoscopy   . Horseshoe kidney   . Hyperlipidemia   . MR (mitral regurgitation)    severe; mitral valve repair in march 2009  . MVP (mitral valve prolapse)   . Osteoarthritis    both knees  . Shortness of breath dyspnea    increased exertion     Past Surgical History:  Procedure Laterality Date  . A-FLUTTER ABLATION N/A 01/29/2018   Procedure: A-FLUTTER ABLATION;  Surgeon: Constance Haw, MD;  Location: [facility];  Service: Cardiovascular;  Laterality: N/A;  . BACK SURGERY     secondary to ruptured disc  . CARDIAC CATHETERIZATION    . CARDIOVERSION N/A 09/23/2017   Procedure: CARDIOVERSION;  Surgeon: Lelon Perla, MD;  [facility]: [facility];  Service: Cardiovascular;  Laterality: N/A;  . heart ablation    . MITRAL VALVE REPAIR  06/2007  . TEE WITHOUT CARDIOVERSION N/A 09/23/2017   Procedure: TRANSESOPHAGEAL  ECHOCARDIOGRAM (TEE);  Surgeon: Lelon Perla, MD;  Location: [facility];  Service: Cardiovascular;  Laterality: N/A;  . TONSILLECTOMY  1960  . TOTAL KNEE ARTHROPLASTY Bilateral 03/09/2015   Procedure: TOTAL KNEE BILATERAL;  Surgeon: Gaynelle Arabian, MD;  Location: [facility];  Service: Orthopedics;  Laterality: Bilateral;    Social History   Socioeconomic History  . Marital status: Married    Spouse name: Not on file  . Number of children: Not on file  . Years of education: Not on file  . Highest education level: Not on file    Occupational History  . Occupation: [employer]: works from home  Social Needs  . Financial resource strain: Not on file  . Food insecurity:    Worry: Not on file    Inability: Not on file  . Transportation needs:    Medical: Not on file    Non-medical: Not on file  Tobacco Use  . Smoking status: Never Smoker  . Smokeless tobacco: Never Used  Substance and Sexual Activity  . Alcohol use: Yes    Alcohol/week: 0.0 standard drinks    Comment: occasional 1-2 wine drinks a month  . Drug use: No  . Sexual activity: Yes    Partners: Female  Lifestyle  . Physical activity:    Days per week: Not on file    Minutes per session: Not on file  . Stress: Not on file  Relationships  . Social connections:    Talks on phone: Not on file    Gets together: Not on file    Attends religious service: Not on file    Active member of club or organization: Not on file    Attends meetings of clubs or organizations: Not on file    Relationship status: Not on file  . Intimate partner violence:    Fear of current or ex partner: Not on file    Emotionally abused: Not on file    Physically abused: Not on file    Forced sexual activity: Not on file  Other Topics Concern  . Not on file  Social History Narrative   Exercise--  [employer] gym    Family History  Problem Relation Age of Onset  . Liver disease Father        cancer  . Hyperlipidemia Father   . Liver cancer Father   . Diabetes Other   . Colitis Daughter   . Crohn's disease Daughter   . Colon cancer Neg Hx   . Esophageal cancer Neg Hx   . Rectal cancer Neg Hx   . Stomach cancer Neg Hx     ROS: no fevers or chills, productive cough, hemoptysis, dysphasia, odynophagia, melena, hematochezia, dysuria, hematuria, rash, seizure activity, orthopnea, PND, pedal edema, claudication. Remaining systems are negative.  Physical Exam: Well-developed obese in no acute distress.  Skin is warm and dry.  HEENT is normal.  Neck is  supple.  Chest is clear to auscultation with normal expansion.  Cardiovascular exam is regular rate and rhythm.  Abdominal exam nontender or distended. No masses palpated. Extremities show no edema. neuro grossly intact  ECG- NSR, first degree AV block; personally reviewed  A/P  1 atrial flutter-patient is status post cardioversion.  Previous attempt at ablation was unsuccessful as it was left-sided flutter.  He saw Dr. Curt Bears last in October.  He made the decision at that time that he would begin Tikosyn if he had recurrent atrial arrhythmias.  He remains  in sinus today.  Continue anticoagulation.  2 prior mitral valve repair-continue SBE prophylaxis.  3 cardiomyopathy-last echocardiogram showed moderate LV dysfunction.  Continue ARB and beta-blocker.  This may have been related to tachycardia.  Now that he has remained in sinus I will repeat echocardiogram to see if LV function has improved.  4 hyperlipidemia-continue Repatha.  Check lipids and liver.  5 obesity-we discussed the importance of weight loss.  Kirk Ruths, MD

## 2018-06-02 ENCOUNTER — Ambulatory Visit: Payer: 59 | Admitting: Cardiology

## 2018-06-03 ENCOUNTER — Telehealth: Payer: Self-pay | Admitting: *Deleted

## 2018-06-03 ENCOUNTER — Ambulatory Visit: Payer: 59 | Admitting: Cardiology

## 2018-06-03 ENCOUNTER — Other Ambulatory Visit: Payer: Self-pay

## 2018-06-03 ENCOUNTER — Encounter: Payer: Self-pay | Admitting: Cardiology

## 2018-06-03 VITALS — BP 126/64 | HR 63 | Ht 70.0 in | Wt 248.2 lb

## 2018-06-03 DIAGNOSIS — E785 Hyperlipidemia, unspecified: Secondary | ICD-10-CM | POA: Diagnosis not present

## 2018-06-03 DIAGNOSIS — I484 Atypical atrial flutter: Secondary | ICD-10-CM

## 2018-06-03 DIAGNOSIS — R7989 Other specified abnormal findings of blood chemistry: Secondary | ICD-10-CM

## 2018-06-03 DIAGNOSIS — R945 Abnormal results of liver function studies: Principal | ICD-10-CM

## 2018-06-03 DIAGNOSIS — I420 Dilated cardiomyopathy: Secondary | ICD-10-CM | POA: Diagnosis not present

## 2018-06-03 LAB — LIPID PANEL
Chol/HDL Ratio: 3.2 ratio (ref 0.0–5.0)
Cholesterol, Total: 160 mg/dL (ref 100–199)
HDL: 50 mg/dL (ref >39)
LDL Calculated: 85 mg/dL (ref 0–99)
Triglycerides: 126 mg/dL (ref 0–149)
VLDL Cholesterol Cal: 25 mg/dL (ref 5–40)

## 2018-06-03 LAB — HEPATIC FUNCTION PANEL
ALT: 133 IU/L — ABNORMAL HIGH (ref 0–44)
AST: 65 IU/L — ABNORMAL HIGH (ref 0–40)
Albumin: 4.9 g/dL — ABNORMAL HIGH (ref 3.6–4.8)
Alkaline Phosphatase: 59 IU/L (ref 39–117)
Bilirubin Total: 0.9 mg/dL (ref 0.0–1.2)
Bilirubin, Direct: 0.23 mg/dL (ref 0.00–0.40)
Total Protein: 7.1 g/dL (ref 6.0–8.5)

## 2018-06-03 MED ORDER — TELMISARTAN 40 MG PO TABS: 40.0000 mg | ORAL_TABLET | Freq: Every day | ORAL | 3 refills | Status: DC

## 2018-06-03 MED ORDER — EVOLOCUMAB 140 MG/ML ~~LOC~~ SOAJ: 140.0000 mg | SUBCUTANEOUS | 3 refills | Status: DC

## 2018-06-03 NOTE — Patient Instructions (Signed)
Medication Instructions:  NO CHANGE If you need a refill on your cardiac medications before your next appointment, please call your pharmacy.   Lab work: Your physician recommends that you HAVE LAB WORK TODAY If you have labs (blood work) drawn today and your tests are completely normal, you will receive your results only by: Marland Kitchen MyChart Message (if you have MyChart) OR . A paper copy in the mail If you have any lab test that is abnormal or we need to change your treatment, we will call you to review the results.  Testing/Procedures: Your physician has requested that you have an echocardiogram. Echocardiography is a painless test that uses sound waves to create images of your heart. It provides your doctor with information about the size and shape of your heart and how well your heart's chambers and valves are working. This procedure takes approximately one hour. There are no restrictions for this procedure.  [place]  Follow-Up: At [facility], you and your health needs are our priority.  As part of our continuing mission to provide you with exceptional heart care, we have created designated Provider Care Teams.  These Care Teams include your primary Cardiologist (physician) and Advanced Practice Providers (APPs -  Physician Assistants and Nurse Practitioners) who all work together to provide you with the care you need, when you need it. You will need a follow up appointment in 6 months.  Please call our office 2 months in advance to schedule this appointment.  You may see Kirk Ruths, MD or one of the following Advanced Practice Providers on your designated Care Team:   Kerin Ransom, PA-C Roby Lofts, [place] . Sande Rives, PA-C

## 2018-06-03 NOTE — Telephone Encounter (Addendum)
Spoke with pt, Aware of dr Jacalyn Lefevre recommendations. Lab orders mailed to the pt.  ----- Message from Lelon Perla, MD sent at 06/03/2018  4:49 PM EST ----- LFTs elevated; DC vascepa; repeat LFTs 12 weeks Kirk Ruths

## 2018-06-03 NOTE — Telephone Encounter (Signed)
Clinic rn [name] notified me that pt needed refill sent rx of repatha autoinjector to [facility] [place] and dc the syringe for repatha

## 2018-06-13 ENCOUNTER — Other Ambulatory Visit: Payer: Self-pay

## 2018-06-13 ENCOUNTER — Ambulatory Visit (HOSPITAL_COMMUNITY): Payer: 59 | Attending: Cardiovascular Disease

## 2018-06-13 DIAGNOSIS — I420 Dilated cardiomyopathy: Secondary | ICD-10-CM | POA: Insufficient documentation

## 2018-06-17 ENCOUNTER — Other Ambulatory Visit: Payer: Self-pay | Admitting: Family Medicine

## 2018-09-15 ENCOUNTER — Telehealth: Payer: Self-pay

## 2018-09-15 NOTE — Telephone Encounter (Signed)
Another one

## 2018-09-17 NOTE — Telephone Encounter (Signed)
Submitted pa awaiting approval

## 2018-09-24 ENCOUNTER — Telehealth: Payer: Self-pay | Admitting: Cardiology

## 2018-09-24 NOTE — Telephone Encounter (Signed)
New message   [facility] for pt to call and schedule virtual visit with Dr. Curt Bears. Will offer doxemity visit if pt has smart phone.

## 2018-09-29 ENCOUNTER — Other Ambulatory Visit: Payer: Self-pay

## 2018-09-29 MED ORDER — METOPROLOL TARTRATE 25 MG PO TABS: 25.0000 mg | ORAL_TABLET | Freq: Two times a day (BID) | ORAL | 3 refills | Status: DC

## 2018-10-16 MED ORDER — APIXABAN 5 MG PO TABS: 5.0000 mg | ORAL_TABLET | Freq: Two times a day (BID) | ORAL | 0 refills | Status: DC

## 2018-10-27 LAB — HEPATIC FUNCTION PANEL
ALT: 100 IU/L — ABNORMAL HIGH (ref 0–44)
AST: 51 IU/L — ABNORMAL HIGH (ref 0–40)
Albumin: 4.6 g/dL (ref 3.8–4.8)
Alkaline Phosphatase: 55 IU/L (ref 39–117)
Bilirubin Total: 0.6 mg/dL (ref 0.0–1.2)
Bilirubin, Direct: 0.16 mg/dL (ref 0.00–0.40)
Total Protein: 6.7 g/dL (ref 6.0–8.5)

## 2018-11-04 NOTE — Telephone Encounter (Signed)
Would have pt fu in lipid clinic for praluent; needs cholesterol med if possible; FU primary care or gastroenterology for elevated LFTs.  [name]

## 2018-11-07 ENCOUNTER — Telehealth: Payer: Self-pay | Admitting: Pharmacist

## 2018-11-07 NOTE — Telephone Encounter (Signed)
Medication Samples have been provided to the patient.  Drug name: Praluent       Strength: 77m     Qty: 2 LOT: [id number] Exp.Date: 06-21-2019   The patient has been instructed regarding the correct time, dose, and frequency of taking this medication, including desired effects and most common side effects.   Alta Shober Rodriguez-Guzman PharmD, BCPS, CMcBee[address] StreetG[place] [id number]/19/2020 7:56 AM

## 2018-11-19 ENCOUNTER — Other Ambulatory Visit: Payer: Self-pay | Admitting: Cardiology

## 2018-12-10 ENCOUNTER — Telehealth: Payer: Self-pay

## 2018-12-10 MED ORDER — PRALUENT 75 MG/ML ~~LOC~~ SOAJ: 75.0000 mg | SUBCUTANEOUS | 6 refills | Status: DC

## 2018-12-10 NOTE — Telephone Encounter (Signed)
Having labored breathing, muscle aches, lower back pain, itchiness. Will route pharmd

## 2018-12-10 NOTE — Telephone Encounter (Signed)
Spoke with patient.  He has noted that on the day he takes Praluent injection, he develops labored breathing about 1-2 hours after his dose.  It usually lasts 8-10 hours and makes it difficult for him to sleep, as he is more comfortable sitting upright.  He has also noted some mild itching and muscle aches, but states those are not as worrisome as the breathing.  Advised that he discontinue Praluent and all PCSK-9 inhibitors at this time (he previously tried Repatha).  We discussed the option of trying Nexletol.  Patient does have fatty liver and elevated AST/ALT.  Per manufacturer, Nexletol increases liver enzymes in about 2% of patients.  Patient would like to review information about medication before making decision.  Suggested that if he would like to try, we can repeat LFT after 2 weeks of samples.  We are currently out of samples, so will call him once they arrive to see what he would like to do.

## 2018-12-15 ENCOUNTER — Telehealth: Payer: Self-pay | Admitting: Cardiology

## 2018-12-15 NOTE — Telephone Encounter (Signed)

## 2018-12-16 ENCOUNTER — Ambulatory Visit: Payer: 59 | Admitting: Cardiology

## 2018-12-16 ENCOUNTER — Other Ambulatory Visit: Payer: Self-pay

## 2018-12-16 ENCOUNTER — Encounter: Payer: Self-pay | Admitting: Cardiology

## 2018-12-16 VITALS — BP 138/90 | HR 104 | Ht 70.0 in | Wt 250.8 lb

## 2018-12-16 DIAGNOSIS — I484 Atypical atrial flutter: Secondary | ICD-10-CM

## 2018-12-16 MED ORDER — METOPROLOL TARTRATE 50 MG PO TABS: 50.0000 mg | ORAL_TABLET | Freq: Two times a day (BID) | ORAL | 1 refills | Status: DC

## 2018-12-16 NOTE — Addendum Note (Signed)
Addended by: Stanton Kidney on: 12/16/2018 09:45 AM   Modules accepted: Orders

## 2018-12-16 NOTE — Patient Instructions (Addendum)
Medication Instructions:  Your physician has recommended you make the following change in your medication:  1. INCREASE Metoprolol tartrate (Lopressor) to 50 mg twice daily  *If you need a refill on your cardiac medications before your next appointment, please call your pharmacy.  Labwork: To be scheduled once procedure date is arranged *Will notify you of abnormal results, otherwise continue current treatment plan.  Testing/Procedures: Your physician has requested that you have cardiac CT within 7 days prior to your ablation. Cardiac computed tomography (CT) is a painless test that uses an x-ray machine to take clear, detailed pictures of your heart. For further information please visit [facility]. Please follow instruction below located under special instructions. You will get a call from our office to schedule the date for this test.  Your physician has recommended that you have an ablation. Catheter ablation is a medical procedure used to treat some cardiac arrhythmias (irregular heartbeats). During catheter ablation, a long, thin, flexible tube is put into a blood vessel in your groin (upper thigh), or neck. This tube is called an ablation catheter. It is then guided to your heart through the blood vessel. Radio frequency waves destroy small areas of heart tissue where abnormal heartbeats may cause an arrhythmia to start. Please see the instructions below located under special instructions  The nurse will call you to schedule this procedure  Follow-Up: Your physician recommends that you schedule a follow-up appointment in: 4 weeks, after your procedure on _____, with Roderic Palau NP in the AFib clinic.  Your physician recommends that you schedule a follow up appointment in: 3 months, after your procedure on _______, with Dr. Curt Bears.  *Please note that any paperwork needing to be filled out by the provider will need to be addressed at the front desk prior to seeing the provider.  Please note that any FMLA, disability or other documents regarding health condition is subject to a $25.00 charge that must be received prior to completion of paperwork in the form of a money order or check.  Thank you for choosing [facility]!! Trinidad Curet, RN [phone]   Any Other Special Instructions Will Be Listed Below   Your cardiac CT will be scheduled at one of the below locations:   [facility] [address] [place], [place] [address] [phone]  [place] [address] [place], [place] [address] [phone]  Please arrive at the [facility] main entrance of [facility] 30-45 minutes prior to test start time. Proceed to the [facility] Radiology Department (first floor) to check-in and test prep.  Please follow these instructions carefully (unless otherwise directed):  Hold all erectile dysfunction medications at least 48 hours prior to test.  On the Night Before the Test: . Be sure to Drink plenty of water. . Do not consume any caffeinated/decaffeinated beverages or chocolate 12 hours prior to your test. . Do not take any antihistamines 12 hours prior to your test. . If you take Metformin do not take 24 hours prior to test. . If the patient has contrast allergy: ? Patient will need a prescription for Prednisone and very clear instructions (as follows): 1. Prednisone 50 mg - take 13 hours prior to test 2. Take another Prednisone 50 mg 7 hours prior to test 3. Take another Prednisone 50 mg 1 hour prior to test 4. Take Benadryl 50 mg 1 hour prior to test . Patient must complete all four doses of above prophylactic  medications. . Patient will need a ride after test due to Benadryl.  On the Day of the Test: . Drink plenty of water. Do not drink any water within one hour of the test. . Do not eat any food 4 hours prior to the test. . You may take your regular medications prior to  the test.  . Take metoprolol (Lopressor) two hours prior to test. . HOLD Furosemide/Hydrochlorothiazide morning of the test. . FEMALES- please wear underwire-free bra if available       After the Test: . Drink plenty of water. . After receiving IV contrast, you may experience a mild flushed feeling. This is normal. . On occasion, you may experience a mild rash up to 24 hours after the test. This is not dangerous. If this occurs, you can take Benadryl 25 mg and increase your fluid intake. . If you experience trouble breathing, this can be serious. If it is severe call 911 IMMEDIATELY. If it is mild, please call our office. . If you take any of these medications: Glipizide/Metformin, Avandament, Glucavance, please do not take 48 hours after completing test.  Please contact the cardiac imaging nurse navigator should you have any questions/concerns Marchia Bond, RN Navigator Cardiac Imaging [name] [phone] Office  [phone] Cell       Instructions for your ablation: 1. Please arrive at the [facility] [address], Main Entrance "A", of [facility] at ______ on ________. 2. Do not eat or drink after midnight the night prior to the procedure. 3. Do not miss any doses of ELIQUIS prior to the morning of the procedure.  4. Do not take any medications the morning of the procedure. 5. Both of your groins will need to be shaved for this procedure (if needed). We ask that you do this yourself at home 1-2 days prior to the procedure.  If you are unable/uncomfortable to do yourself, the hospital staff will shave you the day of your procedure (if needed). 6. Plan for an overnight stay in the hospital. 7. You will need someone to drive you home at discharge.

## 2018-12-16 NOTE — Progress Notes (Signed)
Electrophysiology Office Note   Date:  12/16/2018   ID:  KALEP FULL, DOB 09/01/1950, MRN [id number]  PCP:  Carollee Herter, Alferd Apa, DO  Cardiologist:  Stanford Breed Primary Electrophysiologist:  Brunella Wileman Meredith Leeds, MD    No chief complaint on file.    History of Present Illness: KEIGAN TAFOYA is a 68 y.o. male who is being seen today for the evaluation of atrial flutter at the request of Fabian Sharp. Presenting today for electrophysiology evaluation.  Is a history of CAD, atrial flutter status post ablation, mitral regurgitation status post repair in 2009.  He had his atrial flutter ablation in 2009.  He was seen 08/2017 in atrial flutter with heart rates of 126.  He was referred for cardioversion.  He is currently on Eliquis and metoprolol.  Recent TEE showed an EF of 35 to 40% which is a reduction from his prior echo in 2018.  He presented to cardiology clinic 12/12/2017 complaining of palpitations.  EKG showed atrial flutter.  Had EP study 01/29/18 which showed a LA flutter. He was cardioverted at that time.  Today, denies symptoms of palpitations, chest pain,  orthopnea, PND, lower extremity edema, claudication, dizziness, presyncope, syncope, bleeding, or neurologic sequela. The patient is tolerating medications without difficulties.  Unfortunately he has gone back into atrial flutter.  This is caused him to be quite short of breath with much activity.  He has also had significant weakness and fatigue.   Past Medical History:  Diagnosis Date  . Atrial flutter ([place])    s/p ablation  . CAD (coronary artery disease)    cath 06/2007 40% LM  . CVD (cerebrovascular disease)    59% right ICA, 49% left ICA; h/oTIA; fu study 8/11 with normal carotids  . Dysrhythmia   . Gait abnormality   . Heart murmur   . History of bronchitis   . History of colonoscopy   . Horseshoe kidney   . Hyperlipidemia   . MR (mitral regurgitation)    severe; mitral valve repair in march 2009  . MVP (mitral  valve prolapse)   . Osteoarthritis    both knees  . Shortness of breath dyspnea    increased exertion    Past Surgical History:  Procedure Laterality Date  . A-FLUTTER ABLATION N/A 01/29/2018   Procedure: A-FLUTTER ABLATION;  Surgeon: Constance Haw, MD;  Location: [facility];  Service: Cardiovascular;  Laterality: N/A;  . BACK SURGERY     secondary to ruptured disc  . CARDIAC CATHETERIZATION    . CARDIOVERSION N/A 09/23/2017   Procedure: CARDIOVERSION;  Surgeon: Lelon Perla, MD;  Location: [facility];  Service: Cardiovascular;  Laterality: N/A;  . heart ablation    . MITRAL VALVE REPAIR  06/2007  . TEE WITHOUT CARDIOVERSION N/A 09/23/2017   Procedure: TRANSESOPHAGEAL ECHOCARDIOGRAM (TEE);  Surgeon: Lelon Perla, MD;  Location: [facility];  Service: Cardiovascular;  Laterality: N/A;  . TONSILLECTOMY  1960  . TOTAL KNEE ARTHROPLASTY Bilateral 03/09/2015   Procedure: TOTAL KNEE BILATERAL;  Surgeon: Gaynelle Arabian, MD;  Location: [facility];  Service: Orthopedics;  Laterality: Bilateral;     Current Outpatient Medications  Medication Sig Dispense Refill  . amoxicillin (AMOXIL) 500 MG capsule Take 2,000 mg by mouth See admin instructions. Take 2000 mg by mouth 1 hour dental procedure    . apixaban (ELIQUIS) 5 MG TABS tablet Take 1 tablet (5 mg total) by mouth 2 (two) times daily. 180 tablet 0  . meloxicam (  MOBIC) 15 MG tablet TAKE 1 TABLET BY MOUTH EVERY DAY 90 tablet 0  . metoprolol tartrate (LOPRESSOR) 25 MG tablet Take 1 tablet (25 mg total) by mouth 2 (two) times daily. 180 tablet 3  . telmisartan (MICARDIS) 40 MG tablet Take 1 tablet (40 mg total) by mouth daily. 90 tablet 3   No current facility-administered medications for this visit.     Allergies:   Statins, Codeine, and Tape   Social History:  The patient  reports that he has never smoked. He has never used smokeless tobacco. He reports current alcohol use. He reports that he does not use drugs.    Family History:  The patient's family history includes Colitis in his daughter; Crohn's disease in his daughter; Diabetes in an other family member; Hyperlipidemia in his father; Liver cancer in his father; Liver disease in his father.   ROS:  Please see the history of present illness.   Otherwise, review of systems is positive for none.   All other systems are reviewed and negative.   PHYSICAL EXAM: VS:  BP 138/90   Pulse (!) 104   Ht 5' 10"  (1.778 m)   Wt 250 lb 12.8 oz (113.8 kg)   SpO2 94%   BMI 35.99 kg/m  , BMI Body mass index is 35.99 kg/m. GEN: Well nourished, well developed, in no acute distress  HEENT: normal  Neck: no JVD, carotid bruits, or masses Cardiac: iRRR; no murmurs, rubs, or gallops,no edema  Respiratory:  clear to auscultation bilaterally, normal work of breathing GI: soft, nontender, nondistended, + BS MS: no deformity or atrophy  Skin: warm and dry Neuro:  Strength and sensation are intact Psych: euthymic mood, full affect  EKG:  EKG is ordered today. Personal review of the ekg ordered shows atrial flutter, rate 104   Recent Labs: 01/29/2018: BUN 23; Creatinine, Ser 1.14; Hemoglobin 15.4; Platelets 201; Potassium 4.5; Sodium 139 10/27/2018: ALT 100    Lipid Panel     Component Value Date/Time   CHOL 160 06/03/2018 0858   TRIG 126 06/03/2018 0858   HDL 50 06/03/2018 0858   CHOLHDL 3.2 06/03/2018 0858   CHOLHDL 3 07/15/2017 0855   VLDL 18.0 07/15/2017 0855   LDLCALC 85 06/03/2018 0858   LDLDIRECT 147.0 01/16/2016 0908     Wt Readings from Last 3 Encounters:  12/16/18 250 lb 12.8 oz (113.8 kg)  06/03/18 248 lb 3.2 oz (112.6 kg)  05/06/18 244 lb (110.7 kg)      Other studies Reviewed: Additional studies/ records that were reviewed today include: TEE 06/13/18 Review of the above records today demonstrates:  - Left ventricle: The cavity size was normal. Wall thickness was   normal. Systolic function was normal. The estimated ejection   fraction  was in the range of 50% to 55%. The study is not   technically sufficient to allow evaluation of LV diastolic   function. - Mitral valve: Post MV repair ? neo chord no significant residual   MR. - Left atrium: The atrium was severely dilated. - Atrial septum: No defect or patent foramen ovale was identified.  ASSESSMENT AND PLAN:  1.  Atypical atrial flutter: Currently on Eliquis.  He was initially taken for ablation for atrial flutter, though it was found that it was left atrial instead of right atrial.  He has had a mitral valve repair in the past.  He is unfortunately back in atrial flutter and was having quite a bit of shortness of breath.  We Tunis Gentle thus plan for ablation of his left atrial flutter.  Risks and benefits were discussed and include bleeding, tamponade, heart block, stroke, damage surrounding organs.  He understands these risks and is agreed to the procedure.  This patients CHA2DS2-VASc Score and unadjusted Ischemic Stroke Rate (% per year) is equal to 3.2 % stroke rate/year from a score of 3  Above score calculated as 1 point each if present [CHF, HTN, DM, Vascular=MI/PAD/Aortic Plaque, Age if 65-74, or Male] Above score calculated as 2 points each if present [Age > 75, or Stroke/TIA/TE]    2.  Hypertension: *Mildly elevated today.  Christianne Zacher increase metoprolol to 50 mg.  This Javiel Canepa also help with rate control.  3.  Chronic systolic heart failure: Likely tachycardia mediated.  4.  Coronary artery disease: Mild disease without chest pain.  Current medicines are reviewed at length with the patient today.   The patient does not have concerns regarding his medicines.  The following changes were made today: Increase metoprolol  Labs/ tests ordered today include:  Orders Placed This Encounter  Procedures  . EKG 12-Lead     Disposition:   FU with Arhianna Ebey 3 months  Signed, Timo Hartwig Meredith Leeds, MD  12/16/2018 9:35 AM     [facility] [address]  [place] [address] [phone] (office) [phone] (fax)

## 2018-12-30 ENCOUNTER — Telehealth: Payer: Self-pay | Admitting: Pharmacist Clinician (PhC)/ Clinical Pharmacy Specialist

## 2018-12-30 ENCOUNTER — Other Ambulatory Visit: Payer: Self-pay | Admitting: Pharmacist Clinician (PhC)/ Clinical Pharmacy Specialist

## 2018-12-30 DIAGNOSIS — E785 Hyperlipidemia, unspecified: Secondary | ICD-10-CM

## 2018-12-30 MED ORDER — NEXLETOL 180 MG PO TABS: 180.0000 mg | ORAL_TABLET | Freq: Every day | ORAL | 0 refills | Status: DC

## 2018-12-30 NOTE — Telephone Encounter (Signed)
Called patient, we now have samples of Nexletol 180 mg for him to try.  Patient would like to have liver function tests drawn before starting med due to ongoing elevations in LFTs  (AST/ALT 51/100 in June 2020).  Also, he is scheduled for catheter ablation on Sept 2 and would prefer to wait until after this to start the samples.  Assured patient that he could start whenever he is comfortable and I will include a lab order for hepatic function to have done at that time.   Patient voiced understanding

## 2019-01-02 ENCOUNTER — Telehealth: Payer: Self-pay | Admitting: *Deleted

## 2019-01-02 DIAGNOSIS — I4891 Unspecified atrial fibrillation: Secondary | ICD-10-CM

## 2019-01-02 NOTE — Telephone Encounter (Signed)
Pt agreeable to AFib ablation 9/2. Aware office will contact him to arrange pre procedure CT testing. Pt aware I will follow up next week to review instructions.  Send instructions via my chart also.

## 2019-01-08 ENCOUNTER — Other Ambulatory Visit: Payer: Self-pay | Admitting: Cardiology

## 2019-01-08 ENCOUNTER — Other Ambulatory Visit: Payer: Self-pay | Admitting: *Deleted

## 2019-01-08 DIAGNOSIS — Z01812 Encounter for preprocedural laboratory examination: Secondary | ICD-10-CM

## 2019-01-08 DIAGNOSIS — I483 Typical atrial flutter: Secondary | ICD-10-CM

## 2019-01-08 NOTE — Telephone Encounter (Signed)
Please review for refill. Thank you!

## 2019-01-08 NOTE — Telephone Encounter (Signed)
61M SCR 1.14 01/29/18 113.8KG LOVW/06/03/18 CRENSHAW

## 2019-01-12 ENCOUNTER — Telehealth: Payer: Self-pay | Admitting: Cardiology

## 2019-01-12 NOTE — Telephone Encounter (Signed)
Will forward to Sherri to call patient with his ablation instructions.

## 2019-01-12 NOTE — Telephone Encounter (Signed)
Follow up   Patient is calling to get instructions for the ablation per the previous message. Please call.

## 2019-01-12 NOTE — Telephone Encounter (Signed)
New message   Called pt to schedule f/u appts after afib ablation with Dr. Curt Bears. He said he got the message on Mychart about the lab work and testing but has not received any instructions for the day of the appt. Will he pick those up on the day of his lab work or will you call him with instructions?

## 2019-01-13 ENCOUNTER — Telehealth (HOSPITAL_COMMUNITY): Payer: Self-pay | Admitting: Emergency Medicine

## 2019-01-13 ENCOUNTER — Other Ambulatory Visit: Payer: Self-pay

## 2019-01-13 ENCOUNTER — Other Ambulatory Visit: Payer: 59

## 2019-01-13 DIAGNOSIS — Z01812 Encounter for preprocedural laboratory examination: Secondary | ICD-10-CM

## 2019-01-13 DIAGNOSIS — I483 Typical atrial flutter: Secondary | ICD-10-CM

## 2019-01-13 NOTE — Telephone Encounter (Signed)
Pt returning phone call regarding upcoming cardiac imaging study; pt verbalizes understanding of appt date/time, parking situation and where to check in, pre-test NPO status and medications ordered, and verified current allergies; name and call back number provided for further questions should they arise Gaberial Cada RN Navigator Cardiac Imaging [facility] [phone] office [phone] cell   

## 2019-01-13 NOTE — Telephone Encounter (Signed)
Left message on voicemail with name and callback number Nadiya Pieratt RN Navigator Cardiac Imaging [facility] [phone] Office [phone] Cell  

## 2019-01-13 NOTE — Telephone Encounter (Signed)
Discuss pre procedure/procedure instructions with pt. All questions answered. Patient verbalized understanding and agreeable to plan.

## 2019-01-14 ENCOUNTER — Ambulatory Visit (HOSPITAL_COMMUNITY)
Admission: RE | Admit: 2019-01-14 | Discharge: 2019-01-14 | Disposition: A | Discharge status: Home / Self Care | Payer: 59 | Source: Ambulatory Visit | Attending: Cardiology | Admitting: Cardiology

## 2019-01-14 ENCOUNTER — Ambulatory Visit (HOSPITAL_COMMUNITY): Admission: RE | Admit: 2019-01-14 | Payer: 59 | Source: Ambulatory Visit

## 2019-01-14 ENCOUNTER — Encounter (HOSPITAL_COMMUNITY): Payer: Self-pay

## 2019-01-14 DIAGNOSIS — I4891 Unspecified atrial fibrillation: Secondary | ICD-10-CM

## 2019-01-14 LAB — BASIC METABOLIC PANEL
BUN/Creatinine Ratio: 20 (ref 10–24)
BUN: 19 mg/dL (ref 8–27)
CO2: 19 mmol/L — ABNORMAL LOW (ref 20–29)
Calcium: 9.2 mg/dL (ref 8.6–10.2)
Chloride: 104 mmol/L (ref 96–106)
Creatinine, Ser: 0.97 mg/dL (ref 0.76–1.27)
GFR calc Af Amer: 94 mL/min/{1.73_m2} (ref >59)
GFR calc non Af Amer: 82 mL/min/{1.73_m2} (ref >59)
Glucose: 124 mg/dL — ABNORMAL HIGH (ref 65–99)
Potassium: 4.6 mmol/L (ref 3.5–5.2)
Sodium: 142 mmol/L (ref 134–144)

## 2019-01-14 LAB — CBC
Hematocrit: 48.7 % (ref 37.5–51.0)
Hemoglobin: 16.5 g/dL (ref 13.0–17.7)
MCH: 31.1 pg (ref 26.6–33.0)
MCHC: 33.9 g/dL (ref 31.5–35.7)
MCV: 92 fL (ref 79–97)
Platelets: 200 10*3/uL (ref 150–450)
RBC: 5.3 x10E6/uL (ref 4.14–5.80)
RDW: 12.1 % (ref 11.6–15.4)
WBC: 6.8 10*3/uL (ref 3.4–10.8)

## 2019-01-14 LAB — POCT I-STAT CREATININE: Creatinine, Ser: 1 mg/dL (ref 0.61–1.24)

## 2019-01-14 MED ORDER — IOHEXOL 350 MG/ML SOLN
80.0000 mL | Freq: Once | INTRAVENOUS | Status: AC | PRN
  Administered 2019-01-14: 09:00:00 80 mL via INTRAVENOUS

## 2019-01-17 ENCOUNTER — Other Ambulatory Visit (HOSPITAL_COMMUNITY)
Admission: RE | Admit: 2019-01-17 | Discharge: 2019-01-17 | Disposition: A | Discharge status: Home / Self Care | Payer: 59 | Source: Ambulatory Visit | Attending: Cardiology | Admitting: Cardiology

## 2019-01-17 DIAGNOSIS — Z20828 Contact with and (suspected) exposure to other viral communicable diseases: Secondary | ICD-10-CM | POA: Diagnosis not present

## 2019-01-17 DIAGNOSIS — Z01812 Encounter for preprocedural laboratory examination: Secondary | ICD-10-CM | POA: Diagnosis present

## 2019-01-17 LAB — SARS CORONAVIRUS 2 (TAT 6-24 HRS): SARS Coronavirus 2: NEGATIVE

## 2019-01-20 NOTE — Anesthesia Preprocedure Evaluation (Addendum)
Anesthesia Evaluation  Patient identified by MRN, date of birth, ID band Patient awake    Reviewed: Allergy & Precautions, NPO status , Patient's Chart, lab work & pertinent test results, reviewed documented beta blocker date and time   Airway Mallampati: III  TM Distance: >3 FB Neck ROM: Full  Mouth opening: Limited Mouth Opening  Dental no notable dental hx. (+) Teeth Intact, Dental Advisory Given   Pulmonary shortness of breath and with exertion,    Pulmonary exam normal breath sounds clear to auscultation       Cardiovascular hypertension, Pt. on home beta blockers and Pt. on medications + CAD  Normal cardiovascular exam+ dysrhythmias (on eliquis) Atrial Fibrillation + Valvular Problems/Murmurs (s/p MV repair 2009) MVP and MR  Rhythm:Regular Rate:Normal  TTE 05/2018 EF 50-55%, no residual MR  Stress Test 2016 Nuclear stress EF: 49%. The left ventricular ejection fraction is mildly decreased (45-54%). The study is normal. This is a low risk study.   Neuro/Psych CVA negative psych ROS   GI/Hepatic negative GI ROS, Neg liver ROS,   Endo/Other  negative endocrine ROS  Renal/GU negative Renal ROS  negative genitourinary   Musculoskeletal  (+) Arthritis ,   Abdominal   Peds  Hematology negative hematology ROS (+)   Anesthesia Other Findings   Reproductive/Obstetrics                            Anesthesia Physical Anesthesia Plan  ASA: III  Anesthesia Plan: General   Post-op Pain Management:    Induction: Intravenous  PONV Risk Score and Plan: 2 and Midazolam, Dexamethasone and Ondansetron  Airway Management Planned: Oral ETT  Additional Equipment:   Intra-op Plan:   Post-operative Plan: Extubation in OR  Informed Consent: I have reviewed the patients History and Physical, chart, labs and discussed the procedure including the risks, benefits and alternatives for the  proposed anesthesia with the patient or authorized representative who has indicated his/her understanding and acceptance.     Dental advisory given  Plan Discussed with: CRNA  Anesthesia Plan Comments:         Anesthesia Quick Evaluation

## 2019-01-21 ENCOUNTER — Encounter (HOSPITAL_COMMUNITY): Payer: Self-pay | Admitting: Cardiology

## 2019-01-21 ENCOUNTER — Ambulatory Visit: Payer: 59 | Admitting: Cardiology

## 2019-01-21 ENCOUNTER — Ambulatory Visit (HOSPITAL_COMMUNITY): Payer: 59 | Admitting: Certified Registered Nurse Anesthetist

## 2019-01-21 ENCOUNTER — Ambulatory Visit (HOSPITAL_COMMUNITY)
Admission: RE | Admit: 2019-01-21 | Discharge: 2019-01-22 | Disposition: A | Discharge status: Home / Self Care | Payer: 59 | Attending: Cardiology | Admitting: Cardiology

## 2019-01-21 ENCOUNTER — Other Ambulatory Visit: Payer: Self-pay

## 2019-01-21 ENCOUNTER — Encounter (HOSPITAL_COMMUNITY)
Admission: RE | Disposition: A | Discharge status: Home / Self Care | Payer: Self-pay | Source: Home / Self Care | Attending: Cardiology

## 2019-01-21 DIAGNOSIS — I341 Nonrheumatic mitral (valve) prolapse: Secondary | ICD-10-CM | POA: Insufficient documentation

## 2019-01-21 DIAGNOSIS — Z96653 Presence of artificial knee joint, bilateral: Secondary | ICD-10-CM | POA: Diagnosis not present

## 2019-01-21 DIAGNOSIS — Z888 Allergy status to other drugs, medicaments and biological substances status: Secondary | ICD-10-CM | POA: Diagnosis not present

## 2019-01-21 DIAGNOSIS — Z833 Family history of diabetes mellitus: Secondary | ICD-10-CM | POA: Insufficient documentation

## 2019-01-21 DIAGNOSIS — E785 Hyperlipidemia, unspecified: Secondary | ICD-10-CM | POA: Insufficient documentation

## 2019-01-21 DIAGNOSIS — I4819 Other persistent atrial fibrillation: Secondary | ICD-10-CM | POA: Insufficient documentation

## 2019-01-21 DIAGNOSIS — Z885 Allergy status to narcotic agent status: Secondary | ICD-10-CM | POA: Diagnosis not present

## 2019-01-21 DIAGNOSIS — E119 Type 2 diabetes mellitus without complications: Secondary | ICD-10-CM | POA: Diagnosis not present

## 2019-01-21 DIAGNOSIS — R269 Unspecified abnormalities of gait and mobility: Secondary | ICD-10-CM | POA: Insufficient documentation

## 2019-01-21 DIAGNOSIS — I2510 Atherosclerotic heart disease of native coronary artery without angina pectoris: Secondary | ICD-10-CM | POA: Insufficient documentation

## 2019-01-21 DIAGNOSIS — I5022 Chronic systolic (congestive) heart failure: Secondary | ICD-10-CM | POA: Diagnosis not present

## 2019-01-21 DIAGNOSIS — Z8249 Family history of ischemic heart disease and other diseases of the circulatory system: Secondary | ICD-10-CM | POA: Diagnosis not present

## 2019-01-21 DIAGNOSIS — I484 Atypical atrial flutter: Secondary | ICD-10-CM

## 2019-01-21 DIAGNOSIS — Q631 Lobulated, fused and horseshoe kidney: Secondary | ICD-10-CM | POA: Diagnosis not present

## 2019-01-21 DIAGNOSIS — Z7901 Long term (current) use of anticoagulants: Secondary | ICD-10-CM | POA: Insufficient documentation

## 2019-01-21 DIAGNOSIS — I110 Hypertensive heart disease with heart failure: Secondary | ICD-10-CM | POA: Insufficient documentation

## 2019-01-21 DIAGNOSIS — M1990 Unspecified osteoarthritis, unspecified site: Secondary | ICD-10-CM | POA: Insufficient documentation

## 2019-01-21 HISTORY — PX: ATRIAL FIBRILLATION ABLATION: EP1191

## 2019-01-21 LAB — POCT ACTIVATED CLOTTING TIME
Activated Clotting Time: 285 seconds
Activated Clotting Time: 290 seconds
Activated Clotting Time: 301 seconds
Activated Clotting Time: 318 seconds
Activated Clotting Time: 169 seconds

## 2019-01-21 SURGERY — ATRIAL FIBRILLATION ABLATION
Anesthesia: General

## 2019-01-21 MED ORDER — HEPARIN SODIUM (PORCINE) 1000 UNIT/ML IJ SOLN
INTRAMUSCULAR | Status: AC
  Filled 2019-01-21: qty 1

## 2019-01-21 MED ORDER — PROTAMINE SULFATE 10 MG/ML IV SOLN
INTRAVENOUS | Status: DC | PRN
  Administered 2019-01-21: 50 mg via INTRAVENOUS

## 2019-01-21 MED ORDER — DOBUTAMINE IN D5W 4-5 MG/ML-% IV SOLN
INTRAVENOUS | Status: DC | PRN
  Administered 2019-01-21: 20 ug/kg/min via INTRAVENOUS

## 2019-01-21 MED ORDER — PROPOFOL 10 MG/ML IV BOLUS
INTRAVENOUS | Status: DC | PRN
  Administered 2019-01-21: 150 mg via INTRAVENOUS
  Administered 2019-01-21: 50 mg via INTRAVENOUS

## 2019-01-21 MED ORDER — PHENYLEPHRINE 40 MCG/ML (10ML) SYRINGE FOR IV PUSH (FOR BLOOD PRESSURE SUPPORT)
PREFILLED_SYRINGE | INTRAVENOUS | Status: DC | PRN
  Administered 2019-01-21: 120 ug via INTRAVENOUS
  Administered 2019-01-21 (×3): 80 ug via INTRAVENOUS
  Administered 2019-01-21: 120 ug via INTRAVENOUS

## 2019-01-21 MED ORDER — BEMPEDOIC ACID 180 MG PO TABS: 180.0000 mg | ORAL_TABLET | Freq: Every day | ORAL | Status: DC

## 2019-01-21 MED ORDER — ROCURONIUM BROMIDE 100 MG/10ML IV SOLN
INTRAVENOUS | Status: DC | PRN
  Administered 2019-01-21: 80 mg via INTRAVENOUS

## 2019-01-21 MED ORDER — HEPARIN (PORCINE) IN NACL 1000-0.9 UT/500ML-% IV SOLN
INTRAVENOUS | Status: AC
  Filled 2019-01-21: qty 500

## 2019-01-21 MED ORDER — METOPROLOL TARTRATE 50 MG PO TABS
50.0000 mg | ORAL_TABLET | Freq: Two times a day (BID) | ORAL | Status: DC
  Administered 2019-01-21 – 2019-01-22 (×2): 50 mg via ORAL
  Filled 2019-01-21 (×3): qty 1

## 2019-01-21 MED ORDER — ONDANSETRON HCL 4 MG/2ML IJ SOLN
INTRAMUSCULAR | Status: DC | PRN
  Administered 2019-01-21: 4 mg via INTRAVENOUS

## 2019-01-21 MED ORDER — SODIUM CHLORIDE 0.9% FLUSH: 3.0000 mL | INTRAVENOUS | Status: DC | PRN

## 2019-01-21 MED ORDER — APIXABAN 5 MG PO TABS
5.0000 mg | ORAL_TABLET | Freq: Two times a day (BID) | ORAL | Status: DC
  Administered 2019-01-21 – 2019-01-22 (×3): 5 mg via ORAL
  Filled 2019-01-21 (×3): qty 1

## 2019-01-21 MED ORDER — MIDAZOLAM HCL 5 MG/5ML IJ SOLN
INTRAMUSCULAR | Status: DC | PRN
  Administered 2019-01-21: 2 mg via INTRAVENOUS

## 2019-01-21 MED ORDER — SALINE SPRAY 0.65 % NA SOLN: 1.0000 | Freq: Every evening | NASAL | Status: DC | PRN

## 2019-01-21 MED ORDER — SODIUM CHLORIDE 0.9% FLUSH
3.0000 mL | Freq: Two times a day (BID) | INTRAVENOUS | Status: DC
  Administered 2019-01-21: 3 mL via INTRAVENOUS

## 2019-01-21 MED ORDER — BUPIVACAINE HCL (PF) 0.25 % IJ SOLN
INTRAMUSCULAR | Status: DC | PRN
  Administered 2019-01-21: 30 mL

## 2019-01-21 MED ORDER — FENTANYL CITRATE (PF) 100 MCG/2ML IJ SOLN
INTRAMUSCULAR | Status: DC | PRN
  Administered 2019-01-21: 100 ug via INTRAVENOUS

## 2019-01-21 MED ORDER — AMOXICILLIN 500 MG PO CAPS: 2000.0000 mg | ORAL_CAPSULE | ORAL | Status: DC

## 2019-01-21 MED ORDER — DOBUTAMINE IN D5W 4-5 MG/ML-% IV SOLN
INTRAVENOUS | Status: AC
  Filled 2019-01-21: qty 250

## 2019-01-21 MED ORDER — DEXAMETHASONE SODIUM PHOSPHATE 10 MG/ML IJ SOLN
INTRAMUSCULAR | Status: DC | PRN
  Administered 2019-01-21: 10 mg via INTRAVENOUS

## 2019-01-21 MED ORDER — ADULT MULTIVITAMIN W/MINERALS CH
ORAL_TABLET | ORAL | Status: DC
  Administered 2019-01-22: 09:00:00 1 via ORAL
  Filled 2019-01-21: qty 1

## 2019-01-21 MED ORDER — SODIUM CHLORIDE 0.9 % IV SOLN
INTRAVENOUS | Status: DC | PRN
  Administered 2019-01-21: 50 ug/min via INTRAVENOUS

## 2019-01-21 MED ORDER — ACETAMINOPHEN 500 MG PO TABS
1000.0000 mg | ORAL_TABLET | Freq: Once | ORAL | Status: AC
  Administered 2019-01-21: 07:00:00 1000 mg via ORAL
  Filled 2019-01-21: qty 2

## 2019-01-21 MED ORDER — LIDOCAINE 2% (20 MG/ML) 5 ML SYRINGE
INTRAMUSCULAR | Status: DC | PRN
  Administered 2019-01-21: 60 mg via INTRAVENOUS

## 2019-01-21 MED ORDER — ONDANSETRON HCL 4 MG/2ML IJ SOLN: 4.0000 mg | Freq: Four times a day (QID) | INTRAMUSCULAR | Status: DC | PRN

## 2019-01-21 MED ORDER — SODIUM CHLORIDE 0.9 % IV SOLN: 250.0000 mL | INTRAVENOUS | Status: DC | PRN

## 2019-01-21 MED ORDER — HEPARIN SODIUM (PORCINE) 1000 UNIT/ML IJ SOLN
INTRAMUSCULAR | Status: DC | PRN
  Administered 2019-01-21: 5000 [IU] via INTRAVENOUS
  Administered 2019-01-21: 2000 [IU] via INTRAVENOUS
  Administered 2019-01-21: 15000 [IU] via INTRAVENOUS
  Administered 2019-01-21 (×2): 4000 [IU] via INTRAVENOUS

## 2019-01-21 MED ORDER — HEPARIN (PORCINE) IN NACL 1000-0.9 UT/500ML-% IV SOLN
INTRAVENOUS | Status: DC | PRN
  Administered 2019-01-21 (×5): 500 mL

## 2019-01-21 MED ORDER — IRBESARTAN 150 MG PO TABS
150.0000 mg | ORAL_TABLET | Freq: Every day | ORAL | Status: DC
  Administered 2019-01-22: 09:00:00 150 mg via ORAL
  Filled 2019-01-21 (×2): qty 1

## 2019-01-21 MED ORDER — BUPIVACAINE HCL (PF) 0.25 % IJ SOLN
INTRAMUSCULAR | Status: AC
  Filled 2019-01-21: qty 60

## 2019-01-21 MED ORDER — ACETAMINOPHEN 500 MG PO TABS
ORAL_TABLET | ORAL | Status: AC
  Administered 2019-01-21: 07:00:00 1000 mg via ORAL
  Filled 2019-01-21: qty 2

## 2019-01-21 MED ORDER — SODIUM CHLORIDE 0.9 % IV SOLN
INTRAVENOUS | Status: DC
  Administered 2019-01-21: 08:00:00 via INTRAVENOUS

## 2019-01-21 MED ORDER — ACETAMINOPHEN 325 MG PO TABS
650.0000 mg | ORAL_TABLET | ORAL | Status: DC | PRN
  Administered 2019-01-21: 18:00:00 650 mg via ORAL
  Filled 2019-01-21: qty 2

## 2019-01-21 MED ORDER — SUGAMMADEX SODIUM 200 MG/2ML IV SOLN
INTRAVENOUS | Status: DC | PRN
  Administered 2019-01-21: 250 mg via INTRAVENOUS

## 2019-01-21 MED ORDER — HEPARIN SODIUM (PORCINE) 1000 UNIT/ML IJ SOLN
INTRAMUSCULAR | Status: DC | PRN
  Administered 2019-01-21 (×2): 1000 [IU] via INTRAVENOUS

## 2019-01-21 SURGICAL SUPPLY — 21 items
BLANKET WARM UNDERBOD FULL ACC (MISCELLANEOUS) ×3 IMPLANT
CATH MAPPNG PENTARAY F 2-6-2MM (CATHETERS) IMPLANT
CATH SMTCH THERMOCOOL SF DF (CATHETERS) ×2 IMPLANT
CATH SOUNDSTAR 3D IMAGING (CATHETERS) ×2 IMPLANT
CATH SOUNDSTAR ECO REPROCESSED (CATHETERS) IMPLANT
CATH WEBSTER BI DIR CS D-F CRV (CATHETERS) ×2 IMPLANT
COVER SWIFTLINK CONNECTOR (BAG) ×7 IMPLANT
PACK EP LATEX FREE (CUSTOM PROCEDURE TRAY) ×3
PACK EP LF (CUSTOM PROCEDURE TRAY) ×1 IMPLANT
PAD PRO RADIOLUCENT 2001M-C (PAD) ×3 IMPLANT
PATCH CARTO3 (PAD) ×2 IMPLANT
PENTARAY F 2-6-2MM (CATHETERS) ×3
SHEATH AVANTI 11F 11CM (SHEATH) ×2 IMPLANT
SHEATH BAYLIS SUREFLEX  M 8.5 (SHEATH) ×2
SHEATH BAYLIS SUREFLEX M 8.5 (SHEATH) IMPLANT
SHEATH BAYLIS TRANSSEPTAL 98CM (NEEDLE) ×2 IMPLANT
SHEATH CARTO VIZIGO SM CVD (SHEATH) ×2 IMPLANT
SHEATH PINNACLE 7F 10CM (SHEATH) ×2 IMPLANT
SHEATH PINNACLE 8F 10CM (SHEATH) ×2 IMPLANT
SHEATH PINNACLE 9F 10CM (SHEATH) ×4 IMPLANT
TUBING SMART ABLATE COOLFLOW (TUBING) ×2 IMPLANT

## 2019-01-21 NOTE — Progress Notes (Signed)
Site area: rt groin fv sheaths pulled by DYoung,RN; left groin fv sheaths pulled and pressure held by [facility] Prior to Removal:  Level 0 Pressure Applied For: 20 minutes Manual:   yes Patient Status During Pull:  stable Post Pull Site:  Level 0 Post Pull Instructions Given:  yes Post Pull Pulses Present: rt and left dp pulses palpable Dressing Applied:  Gauze and tegaderm Bedrest begins @ 1330 Comments: IV saline locked

## 2019-01-21 NOTE — Discharge Summary (Addendum)
ELECTROPHYSIOLOGY PROCEDURE DISCHARGE SUMMARY    Patient ID: [name],  MRN: [id number], DOB/AGE: 11-11-1950 68 y.o.  Admit date: 01/21/2019 Discharge date: 01/22/2019  PCP: Ann Held, DO  Primary Cardiologist: Dr. Stanford Breed Electrophysiologist: Dr. Curt Bears  Primary Discharge Diagnosis:  Atypical atrial flutter Atrial fibrillation  Secondary Discharge Diagnosis:  HTN Chronic systolic CHF CAD  Procedures This Admission:  1.  Electrophysiology study and radiofrequency catheter ablation on 01/21/2019 by Dr Allegra Lai. This study demonstrated Afib upon presentation, Successful electrical isolation and anatomical encircling of all four pulmonary veins with radiofrequency current.  A WACA approach was used; Additional left atrial ablation was performed with a standard box lesion created along the posterior wall of the left atrium; Atrial fibrillation successfully cardioverted to sinus rhythm;  No early apparent complications.  Brief HPI: [name] is a 68 y.o. male with a history of atypical atrial flutter. Risks, benefits, and alternatives to catheter ablation of atrial flutter were reviewed with the patient who wished to proceed.  The patient underwent intracardiac echocardiography during the procedure which demonstrated no LAA thrombus.    Hospital Course:  The patient was admitted and underwent EPS/RFCA of atrial flutter and subsequently underwent atrial fibrillation ablation as well with details as outlined above.  They were monitored on telemetry overnight which demonstrated NSR.  R groin had bleeding overnight, but soft and without significant hematoma day of discharge. The patient was examined and considered to be stable for discharge once ambulated.  Wound care and restrictions were reviewed with the patient.  The patient [name] be seen back by Afib clinic in 4 weeks and Dr Curt Bears in 12 weeks for post ablation follow up.   This patients CHA2DS2-VASc Score and  unadjusted Ischemic Stroke Rate (% per year) is equal to 3.2 % stroke rate/year from a score of 3 Above score calculated as 1 point each if present [CHF, HTN, DM, Vascular=MI/PAD/Aortic Plaque, Age if 65-74, or Male] Above score calculated as 2 points each if present [Age > 75, or Stroke/TIA/TE]   Physical Exam: Vitals:   01/21/19 2155 01/22/19 0455 01/22/19 0500 01/22/19 0520  BP: 118/76 114/75    Pulse: 93 77    Resp:      Temp: 97.7 F (36.5 C)   97.7 F (36.5 C)  TempSrc: Oral   Oral  SpO2: 97% 95%    Weight:   117.9 kg   Height:        GEN- The patient is well appearing, alert and oriented x 3 today.   HEENT: normocephalic, atraumatic; sclera clear, conjunctiva pink; hearing intact; oropharynx clear; neck supple  Lungs- Clear to ausculation bilaterally, normal work of breathing.  No wheezes, rales, rhonchi Heart- Regular rate and rhythm, no murmurs, rubs or gallops  GI- soft, non-tender, non-distended, bowel sounds present  Extremities- no clubbing, cyanosis, or edema; DP/PT/radial pulses 2+ bilaterally, groin without hematoma/bruit MS- no significant deformity or atrophy Skin- warm and dry, no rash or lesion Psych- euthymic mood, full affect Neuro- strength and sensation are intact   Labs:   Lab Results  Component Value Date   WBC 6.8 01/13/2019   HGB 16.5 01/13/2019   HCT 48.7 01/13/2019   MCV 92 01/13/2019   PLT 200 01/13/2019   No results for input(s): NA, K, CL, CO2, BUN, CREATININE, CALCIUM, PROT, BILITOT, ALKPHOS, ALT, AST, GLUCOSE in the last 168 hours.  Invalid input(s): LABALBU   Discharge Medications:  Allergies as of 01/22/2019  Reactions   Statins Other (See Comments)   elevated liver function    Codeine Nausea Only   Tape Rash      Medication List    TAKE these medications   acetaminophen 325 MG tablet Commonly known as: TYLENOL Take 2 tablets (650 mg total) by mouth every 4 (four) hours as needed for headache or mild pain.    amoxicillin 500 MG capsule Commonly known as: AMOXIL Take 2,000 mg by mouth See admin instructions. Take 2000 mg by mouth 1 hour dental procedure   CENTRUM SILVER PO Take 1 tablet by mouth 2 (two) times a week.   Eliquis 5 MG Tabs tablet Generic drug: apixaban TAKE 1 TABLET BY MOUTH TWICE A DAY What changed: how much to take   ICY HOT EX Apply 1 application topically daily as needed (muscle pain).   meloxicam 15 MG tablet Commonly known as: MOBIC TAKE 1 TABLET BY MOUTH EVERY DAY What changed:   when to take this  reasons to take this   metoprolol tartrate 50 MG tablet Commonly known as: LOPRESSOR Take 1 tablet (50 mg total) by mouth 2 (two) times daily.   Nexletol 180 MG Tabs Generic drug: Bempedoic Acid Take 180 mg by mouth daily.   sodium chloride 0.65 % Soln nasal spray Commonly known as: OCEAN Place 1 spray into both nostrils at bedtime as needed (nasal cleansing).   telmisartan 40 MG tablet Commonly known as: MICARDIS Take 1 tablet (40 mg total) by mouth daily. What changed: when to take this       Disposition:   [place], Peter Sweeney Martin, MD Follow up on 04/23/2019.   Specialty: Cardiology Why: at 0930 for 3 month post ablation follow up Contact information: [address] [place] [address] [phone]        [place] Follow up on 02/19/2019.   Specialty: Cardiology Why: at 0830 for 4 week post ablation follow up Contact information: [address] [id number] [place] [phone]          Duration of Discharge Encounter: Greater than 30 minutes including physician time.  Signed, Peter Friar, PA-C  01/22/2019 8:33 AM   I have seen and examined this patient with [name].  Agree with above, note added to reflect my findings.  On exam, RRR, no murmurs, lungs clear.  Patient had ablation for left atrial flutter. Tolerated the  procedure well without complaint this morning. Plan for discharge today with follow up in EP clinic.  [name] M. Lendon George MD 01/22/2019 11:29 AM

## 2019-01-21 NOTE — Transfer of Care (Signed)
Immediate Anesthesia Transfer of Care Note  Patient: [name]  Procedure(s) Performed: ATRIAL FIBRILLATION ABLATION (N/A )  Patient Location: Cath Lab  Anesthesia Type:General  Level of Consciousness: awake  Airway & Oxygen Therapy: Patient Spontanous Breathing and Patient connected to face mask oxygen  Post-op Assessment: Report given to RN and Post -op Vital signs reviewed and stable  Post vital signs: Reviewed  Last Vitals:  Vitals Value Taken Time  BP 117/58 01/21/19 1251  Temp 36.3 C 01/21/19 1248  Pulse 77 01/21/19 1252  Resp 25 01/21/19 1252  SpO2 96 % 01/21/19 1252  Vitals shown include unvalidated device data.  Last Pain:  Vitals:   01/21/19 1248  TempSrc: Temporal  PainSc: 0-No pain         Complications: No apparent anesthesia complications

## 2019-01-21 NOTE — Anesthesia Procedure Notes (Signed)
Procedure Name: Intubation Date/Time: 01/21/2019 8:49 AM Performed by: Janene Harvey, CRNA Pre-anesthesia Checklist: Patient identified, Emergency Drugs available, Suction available and Patient being monitored Patient Re-evaluated:Patient Re-evaluated prior to induction Oxygen Delivery Method: Circle system utilized Preoxygenation: Pre-oxygenation with 100% oxygen Induction Type: IV induction Ventilation: Oral airway inserted - appropriate to patient size and Two handed mask ventilation required Laryngoscope Size: Mac, Glidescope and 3 Grade View: Grade I Tube type: Oral Tube size: 7.5 mm Number of attempts: 1 Airway Equipment and Method: Stylet and Oral airway Placement Confirmation: ETT inserted through vocal cords under direct vision,  positive ETCO2 and breath sounds checked- equal and bilateral Secured at: 22 cm Tube secured with: Tape Dental Injury: Teeth and Oropharynx as per pre-operative assessment

## 2019-01-21 NOTE — H&P (Signed)
Electrophysiology Office Note   Date:  01/21/2019   ID:  VENKAT House, DOB 1951/04/24, MRN [id number]  PCP:  Carollee Herter, Alferd Apa, DO  Cardiologist:  Stanford Breed Primary Electrophysiologist:  Sherril Shipman Meredith Leeds, MD    No chief complaint on file.    History of Present Illness: [name] is a 68 y.o. male who is being seen today for the evaluation of atrial flutter at the request of Fabian Sharp. Presenting today for electrophysiology evaluation.  Is a history of CAD, atrial flutter status post ablation, mitral regurgitation status post repair in 2009.  He had his atrial flutter ablation in 2009.  He was seen 08/2017 in atrial flutter with heart rates of 126.  He was referred for cardioversion.  He is currently on Eliquis and metoprolol.  Recent TEE showed an EF of 35 to 40% which is a reduction from his prior echo in 2018.  He presented to cardiology clinic 12/12/2017 complaining of palpitations.  EKG showed atrial flutter.  Had EP study 01/29/18 which showed a LA flutter. He was cardioverted at that time.  Today, denies symptoms of palpitations, chest pain, shortness of breath, orthopnea, PND, lower extremity edema, claudication, dizziness, presyncope, syncope, bleeding, or neurologic sequela. The patient is tolerating medications without difficulties. Plan for ablation today.    Past Medical History:  Diagnosis Date   Atrial flutter ([place])    s/p ablation   CAD (coronary artery disease)    cath 06/2007 40% LM   CVD (cerebrovascular disease)    59% right ICA, 49% left ICA; h/oTIA; fu study 8/11 with normal carotids   Dysrhythmia    Gait abnormality    Heart murmur    History of bronchitis    History of colonoscopy    Horseshoe kidney    Hyperlipidemia    MR (mitral regurgitation)    severe; mitral valve repair in march 2009   MVP (mitral valve prolapse)    Osteoarthritis    both knees   Shortness of breath dyspnea    increased exertion    Past Surgical  History:  Procedure Laterality Date   A-FLUTTER ABLATION N/A 01/29/2018   Procedure: A-FLUTTER ABLATION;  Surgeon: Constance Haw, MD;  Location: [facility];  Service: Cardiovascular;  Laterality: N/A;   BACK SURGERY     secondary to ruptured disc   CARDIAC CATHETERIZATION     CARDIOVERSION N/A 09/23/2017   Procedure: CARDIOVERSION;  Surgeon: Lelon Perla, MD;  Location: [facility];  Service: Cardiovascular;  Laterality: N/A;   heart ablation     MITRAL VALVE REPAIR  06/2007   TEE WITHOUT CARDIOVERSION N/A 09/23/2017   Procedure: TRANSESOPHAGEAL ECHOCARDIOGRAM (TEE);  Surgeon: Lelon Perla, MD;  Location: [facility];  Service: Cardiovascular;  Laterality: N/A;   [place] Bilateral 03/09/2015   Procedure: TOTAL KNEE BILATERAL;  Surgeon: Gaynelle Arabian, MD;  Location: [facility];  Service: Orthopedics;  Laterality: Bilateral;     Current Facility-Administered Medications  Medication Dose Route Frequency Provider Last Rate Last Dose   0.9 %  sodium chloride infusion   Intravenous Continuous Constance Haw, MD 50 mL/hr at 01/21/19 9470      Allergies:   Statins, Codeine, and Tape   Social History:  The patient  reports that he has never smoked. He has never used smokeless tobacco. He reports current alcohol use. He reports that he does not use drugs.   Family History:  The  patient's family history includes Colitis in his daughter; Crohn's disease in his daughter; Diabetes in an other family member; Hyperlipidemia in his father; Liver cancer in his father; Liver disease in his father.   ROS:  Please see the history of present illness.   Otherwise, review of systems is positive for none.   All other systems are reviewed and negative.   PHYSICAL EXAM: VS:  BP (!) 142/108    Pulse (!) 150 Comment: Has been in bed for about 15 minutes, Jennifer aware.   Temp 98.3 F (36.8 C) (Oral)    Resp 20    Ht 5' 10"  (1.778 m)    Wt  112.5 kg    SpO2 97%    BMI 35.58 kg/m  , BMI Body mass index is 35.58 kg/m. GEN: Well nourished, well developed, in no acute distress  HEENT: normal  Neck: no JVD, carotid bruits, or masses Cardiac: RRR; no murmurs, rubs, or gallops,no edema  Respiratory:  clear to auscultation bilaterally, normal work of breathing GI: soft, nontender, nondistended, + BS MS: no deformity or atrophy  Skin: warm and dry Neuro:  Strength and sensation are intact Psych: euthymic mood, full affect   EKG:  EKG is ordered today. Personal review of the ekg ordered shows atrial flutter, rate 104   Recent Labs: 10/27/2018: ALT 100 01/13/2019: BUN 19; Hemoglobin 16.5; Platelets 200; Potassium 4.6; Sodium 142 01/14/2019: Creatinine, Ser 1.00    Lipid Panel     Component Value Date/Time   CHOL 160 06/03/2018 0858   TRIG 126 06/03/2018 0858   HDL 50 06/03/2018 0858   CHOLHDL 3.2 06/03/2018 0858   CHOLHDL 3 07/15/2017 0855   VLDL 18.0 07/15/2017 0855   LDLCALC 85 06/03/2018 0858   LDLDIRECT 147.0 01/16/2016 0908     Wt Readings from Last 3 Encounters:  01/21/19 112.5 kg  12/16/18 113.8 kg  06/03/18 112.6 kg      Other studies Reviewed: Additional studies/ records that were reviewed today include: TEE 06/13/18 Review of the above records today demonstrates:  - Left ventricle: The cavity size was normal. Wall thickness was   normal. Systolic function was normal. The estimated ejection   fraction was in the range of 50% to 55%. The study is not   technically sufficient to allow evaluation of LV diastolic   function. - Mitral valve: Post MV repair ? neo chord no significant residual   MR. - Left atrium: The atrium was severely dilated. - Atrial septum: No defect or patent foramen ovale was identified.  ASSESSMENT AND PLAN:  1.  Atypical atrial flutter: Currently on Eliquis.  He was initially taken for ablation for atrial flutter, though it was found that it was left atrial instead of right  atrial.  He has had a mitral valve repair in the past.  He is unfortunately back in atrial flutter and was having quite a bit of shortness of breath.  We Anielle Headrick thus plan for ablation of his left atrial flutter.  Risks and benefits were discussed and include bleeding, tamponade, heart block, stroke, damage surrounding organs.  He understands these risks and is agreed to the procedure.  This patients CHA2DS2-VASc Score and unadjusted Ischemic Stroke Rate (% per year) is equal to 3.2 % stroke rate/year from a score of 3  Above score calculated as 1 point each if present [CHF, HTN, DM, Vascular=MI/PAD/Aortic Plaque, Age if 65-74, or Male] Above score calculated as 2 points each if present [Age > 75, or Stroke/TIA/TE]  2.  Hypertension: *Mildly elevated today.  Eliabeth Shoff increase metoprolol to 50 mg.  This Liliauna Santoni also help with rate control.  3.  Chronic systolic heart failure: Likely tachycardia mediated.  4.  Coronary artery disease: Mild disease without chest pain.  [name] has presented today for surgery, with the diagnosis of atrial fib/flutter.  The various methods of treatment have been discussed with the patient and family. After consideration of risks, benefits and other options for treatment, the patient has consented to  Procedure(s): Catheter ablation as a surgical intervention .  Risks include but not limited to bleeding, tamponade, heart block, stroke, damage to surrounding organs, among others. The patient's history has been reviewed, patient examined, no change in status, stable for surgery.  I have reviewed the patient's chart and labs.  Questions were answered to the patient's satisfaction.    Allegra Lai, MD 01/21/2019 7:49 AM    Signed, Mouhamad Teed Meredith Leeds, MD  01/21/2019 7:48 AM     [facility] [address] [place] [address] [phone] (office) [phone] (fax)

## 2019-01-21 NOTE — Progress Notes (Signed)
Patients groin sites stable when checked. 1500 noted right groin site saturated in blood, pressure held 20 minutes and redressed site. No further bleeding noted in right or left groin site.

## 2019-01-22 DIAGNOSIS — I484 Atypical atrial flutter: Secondary | ICD-10-CM | POA: Diagnosis not present

## 2019-01-22 MED ORDER — ACETAMINOPHEN 325 MG PO TABS: 650.0000 mg | ORAL_TABLET | ORAL | Status: DC | PRN

## 2019-01-22 NOTE — Progress Notes (Signed)
Discharge instructions (including medications) discussed with and copy provided to patient/caregiver.   Patient ambulated in hall with no issues.

## 2019-01-22 NOTE — Discharge Instructions (Signed)
Cardiac Ablation, Care After This sheet gives you information about how to care for yourself after your procedure. Your health care provider may also give you more specific instructions. If you have problems or questions, contact your health care provider. What can I expect after the procedure? After the procedure, it is common to have:  Bruising around your puncture site.  Tenderness around your puncture site.  Skipped heartbeats.  Tiredness (fatigue).  Follow these instructions at home: Puncture site care   Follow instructions from your health care provider about how to take care of your puncture site. Make sure you: ? If present, leave stitches (sutures), skin glue, or adhesive strips in place. These skin closures may need to stay in place for up to 2 weeks. If adhesive strip edges start to loosen and curl up, you may trim the loose edges. Do not remove adhesive strips completely unless your health care provider tells you to do that.  Check your puncture site every day for signs of infection. Check for: ? Redness, swelling, or pain. ? Fluid or blood. If your puncture site starts to bleed, lie down on your back, apply firm pressure to the area, and contact your health care provider. ? Warmth. ? Pus or a bad smell. Driving  Do not drive for at least 4 days (Sunday January 25, 2019)  after your procedure or however long your health care provider recommends.  Do not drive or use heavy machinery while taking prescription pain medicine.  Do not drive for 24 hours if you were given a medicine to help you relax (sedative) during your procedure. Activity  Avoid activities that take a lot of effort for at least 7 days after your procedure.  Do not lift anything that is heavier than 5 lb (4.5 kg) for one week.   No sexual activity for 1 week.   Return to your normal activities as told by your health care provider. Ask your health care provider what activities are safe for  you. General instructions  Take over-the-counter and prescription medicines only as told by your health care provider.  Do not use any products that contain nicotine or tobacco, such as cigarettes and e-cigarettes. If you need help quitting, ask your health care provider.  You may shower after 24 hours, but Do not take baths, swim, or use a hot tub for 1 week.   Do not drink alcohol for 24 hours after your procedure.  Keep all follow-up visits as told by your health care provider. This is important. Contact a health care provider if:  You have redness, mild swelling, or pain around your puncture site.  You have fluid or blood coming from your puncture site that stops after applying firm pressure to the area.  Your puncture site feels warm to the touch.  You have pus or a bad smell coming from your puncture site.  You have a fever.  You have chest pain or discomfort that spreads to your neck, jaw, or arm.  You are sweating a lot.  You feel nauseous.  You have a fast or irregular heartbeat.  You have shortness of breath.  You are dizzy or light-headed and feel the need to lie down.  You have pain or numbness in the arm or leg closest to your puncture site. Get help right away if:  Your puncture site suddenly swells.  Your puncture site is bleeding and the bleeding does not stop after applying firm pressure to the area. These symptoms may  represent a serious problem that is an emergency. Do not wait to see if the symptoms will go away. Get medical help right away. Call your local emergency services (911 in the [place] Do not drive yourself to the hospital. Summary  After the procedure, it is normal to have bruising and tenderness at the puncture site in your groin, neck, or forearm.  Check your puncture site every day for signs of infection.  Get help right away if your puncture site is bleeding and the bleeding does not stop after applying firm pressure to the area. This  is a medical emergency. This information is not intended to replace advice given to you by your health care provider. Make sure you discuss any questions you have with your health care provider.

## 2019-01-23 NOTE — Anesthesia Postprocedure Evaluation (Signed)
Anesthesia Post Note  Patient: [name]  Procedure(s) Performed: ATRIAL FIBRILLATION ABLATION (N/A )     Patient location during evaluation: PACU Anesthesia Type: General Level of consciousness: awake and alert Pain management: pain level controlled Vital Signs Assessment: post-procedure vital signs reviewed and stable Respiratory status: spontaneous breathing, nonlabored ventilation, respiratory function stable and patient connected to nasal cannula oxygen Cardiovascular status: blood pressure returned to baseline and stable Postop Assessment: no apparent nausea or vomiting Anesthetic complications: no    Last Vitals:  Vitals:   01/22/19 0520 01/22/19 0906  BP:  129/76  Pulse:  78  Resp:    Temp: 36.5 C   SpO2:      Last Pain:  Vitals:   01/22/19 0906  TempSrc:   PainSc: 0-No pain                 [name]

## 2019-01-27 MED ORDER — CARVEDILOL 12.5 MG PO TABS: 12.5000 mg | ORAL_TABLET | Freq: Two times a day (BID) | ORAL | 2 refills | Status: DC

## 2019-02-19 ENCOUNTER — Other Ambulatory Visit: Payer: Self-pay

## 2019-02-19 ENCOUNTER — Ambulatory Visit (HOSPITAL_COMMUNITY)
Admission: RE | Admit: 2019-02-19 | Discharge: 2019-02-19 | Disposition: A | Discharge status: Home / Self Care | Payer: 59 | Source: Ambulatory Visit | Attending: Physician Assistant | Admitting: Physician Assistant

## 2019-02-19 ENCOUNTER — Encounter (HOSPITAL_COMMUNITY): Payer: Self-pay | Admitting: Physician Assistant

## 2019-02-19 VITALS — BP 142/76 | HR 63 | Ht 70.0 in | Wt 251.8 lb

## 2019-02-19 DIAGNOSIS — Z6836 Body mass index (BMI) 36.0-36.9, adult: Secondary | ICD-10-CM | POA: Insufficient documentation

## 2019-02-19 DIAGNOSIS — Z888 Allergy status to other drugs, medicaments and biological substances status: Secondary | ICD-10-CM | POA: Insufficient documentation

## 2019-02-19 DIAGNOSIS — E669 Obesity, unspecified: Secondary | ICD-10-CM | POA: Diagnosis not present

## 2019-02-19 DIAGNOSIS — I484 Atypical atrial flutter: Secondary | ICD-10-CM

## 2019-02-19 DIAGNOSIS — Z79899 Other long term (current) drug therapy: Secondary | ICD-10-CM | POA: Insufficient documentation

## 2019-02-19 DIAGNOSIS — I2510 Atherosclerotic heart disease of native coronary artery without angina pectoris: Secondary | ICD-10-CM | POA: Insufficient documentation

## 2019-02-19 DIAGNOSIS — I4891 Unspecified atrial fibrillation: Secondary | ICD-10-CM | POA: Diagnosis not present

## 2019-02-19 DIAGNOSIS — Z7901 Long term (current) use of anticoagulants: Secondary | ICD-10-CM | POA: Diagnosis not present

## 2019-02-19 DIAGNOSIS — I110 Hypertensive heart disease with heart failure: Secondary | ICD-10-CM | POA: Insufficient documentation

## 2019-02-19 DIAGNOSIS — E785 Hyperlipidemia, unspecified: Secondary | ICD-10-CM | POA: Diagnosis not present

## 2019-02-19 DIAGNOSIS — I5022 Chronic systolic (congestive) heart failure: Secondary | ICD-10-CM | POA: Diagnosis not present

## 2019-02-19 NOTE — Progress Notes (Signed)
Primary Care Physician: Carollee Herter, Alferd Apa, DO Primary Cardiologist: Dr Stanford Breed Referring Physician:Dr. Gabriel Earing is a 68 y.o. male with a h/o CAD, EF of 35-40%,CVD, MVR in 2009, who presented for a typical atrial flutter ablation but it was found that he had left atrial atypical flutter ablation, so procedure was not done and the pt was cardioverted and d/c home. He is now s/p afib and left atrial flutter ablation with Dr Curt Bears on 01/21/19. Patient reports that he feels very well since his ablation. His SOB has greatly improved. He denies CP, swallowing, or groin issues.   Today, he denies symptoms of palpitations, chest pain, shortness of breath, orthopnea, PND, lower extremity edema, dizziness, presyncope, syncope, or neurologic sequela. The patient is tolerating medications without difficulties and is otherwise without complaint today.   Past Medical History:  Diagnosis Date  . Atrial flutter ([place])    s/p ablation  . CAD (coronary artery disease)    cath 06/2007 40% LM  . CVD (cerebrovascular disease)    59% right ICA, 49% left ICA; h/oTIA; fu study 8/11 with normal carotids  . Dysrhythmia   . Gait abnormality   . Heart murmur   . History of bronchitis   . History of colonoscopy   . Horseshoe kidney   . Hyperlipidemia   . MR (mitral regurgitation)    severe; mitral valve repair in march 2009  . MVP (mitral valve prolapse)   . Osteoarthritis    both knees  . Shortness of breath dyspnea    increased exertion    Past Surgical History:  Procedure Laterality Date  . A-FLUTTER ABLATION N/A 01/29/2018   Procedure: A-FLUTTER ABLATION;  Surgeon: Constance Haw, MD;  Location: [facility];  Service: Cardiovascular;  Laterality: N/A;  . ATRIAL FIBRILLATION ABLATION N/A 01/21/2019   Procedure: ATRIAL FIBRILLATION ABLATION;  Surgeon: Constance Haw, MD;  Location: [facility];  Service: Cardiovascular;  Laterality: N/A;  . BACK SURGERY     secondary to ruptured disc  . CARDIAC CATHETERIZATION    . CARDIOVERSION N/A 09/23/2017   Procedure: CARDIOVERSION;  Surgeon: Lelon Perla, MD;  Location: [facility];  Service: Cardiovascular;  Laterality: N/A;  . heart ablation    . MITRAL VALVE REPAIR  06/2007  . TEE WITHOUT CARDIOVERSION N/A 09/23/2017   Procedure: TRANSESOPHAGEAL ECHOCARDIOGRAM (TEE);  Surgeon: Lelon Perla, MD;  Location: [facility];  Service: Cardiovascular;  Laterality: N/A;  . TONSILLECTOMY  1960  . TOTAL KNEE ARTHROPLASTY Bilateral 03/09/2015   Procedure: TOTAL KNEE BILATERAL;  Surgeon: Gaynelle Arabian, MD;  Location: [facility];  Service: Orthopedics;  Laterality: Bilateral;    Current Outpatient Medications  Medication Sig Dispense Refill  . acetaminophen (TYLENOL) 325 MG tablet Take 2 tablets (650 mg total) by mouth every 4 (four) hours as needed for headache or mild pain.    Marland Kitchen amoxicillin (AMOXIL) 500 MG capsule Take 2,000 mg by mouth See admin instructions. Take 2000 mg by mouth 1 hour dental procedure    . Bempedoic Acid (NEXLETOL) 180 MG TABS Take 180 mg by mouth daily. 21 tablet 0  . carvedilol (COREG) 12.5 MG tablet Take 1 tablet (12.5 mg total) by mouth 2 (two) times daily. 60 tablet 2  . ELIQUIS 5 MG TABS tablet TAKE 1 TABLET BY MOUTH TWICE A DAY (Patient taking differently: Take 5 mg by mouth 2 (two) times daily. ) 180 tablet 0  . meloxicam (MOBIC) 15 MG  tablet TAKE 1 TABLET BY MOUTH EVERY DAY (Patient taking differently: Take 15 mg by mouth daily as needed for pain. ) 90 tablet 0  . Menthol, Topical Analgesic, (ICY HOT EX) Apply 1 application topically daily as needed (muscle pain).    . Multiple Vitamins-Minerals (CENTRUM SILVER PO) Take 1 tablet by mouth 2 (two) times a week.    . sodium chloride (OCEAN) 0.65 % SOLN nasal spray Place 1 spray into both nostrils at bedtime as needed (nasal cleansing).    Marland Kitchen telmisartan (MICARDIS) 40 MG tablet Take 1 tablet (40 mg total) by mouth daily. (Patient taking  differently: Take 40 mg by mouth every evening. ) 90 tablet 3   No current facility-administered medications for this encounter.     Allergies  Allergen Reactions  . Statins Other (See Comments)    elevated liver function   . Codeine Nausea Only  . Tape Rash    Social History   Socioeconomic History  . Marital status: Married    Spouse name: Not on file  . Number of children: Not on file  . Years of education: Not on file  . Highest education level: Not on file  Occupational History  . Occupation: [employer]: works from home  Social Needs  . Financial resource strain: Not on file  . Food insecurity    Worry: Not on file    Inability: Not on file  . Transportation needs    Medical: Not on file    Non-medical: Not on file  Tobacco Use  . Smoking status: Never Smoker  . Smokeless tobacco: Never Used  Substance and Sexual Activity  . Alcohol use: Yes    Alcohol/week: 0.0 standard drinks    Comment: occasional 1-2 wine drinks a month  . Drug use: No  . Sexual activity: Yes    Partners: Female  Lifestyle  . Physical activity    Days per week: Not on file    Minutes per session: Not on file  . Stress: Not on file  Relationships  . Social Herbalist on phone: Not on file    Gets together: Not on file    Attends religious service: Not on file    Active member of club or organization: Not on file    Attends meetings of clubs or organizations: Not on file    Relationship status: Not on file  . Intimate partner violence    Fear of current or ex partner: Not on file    Emotionally abused: Not on file    Physically abused: Not on file    Forced sexual activity: Not on file  Other Topics Concern  . Not on file  Social History Narrative   Exercise--  [employer] gym    Family History  Problem Relation Age of Onset  . Liver disease Father        cancer  . Hyperlipidemia Father   . Liver cancer Father   . Diabetes Other   . Colitis Daughter    . Crohn's disease Daughter   . Colon cancer Neg Hx   . Esophageal cancer Neg Hx   . Rectal cancer Neg Hx   . Stomach cancer Neg Hx     ROS- All systems are reviewed and negative except as per the HPI above  Physical Exam: There were no vitals filed for this visit. Wt Readings from Last 3 Encounters:  01/22/19 117.9 kg  12/16/18 113.8 kg  06/03/18 112.6 kg    Labs: Lab Results  Component Value Date   NA 142 01/13/2019   K 4.6 01/13/2019   CL 104 01/13/2019   CO2 19 (L) 01/13/2019   GLUCOSE 124 (H) 01/13/2019   BUN 19 01/13/2019   CREATININE 1.00 01/14/2019   CALCIUM 9.2 01/13/2019   PHOS 3.5 08/09/2007   MG 2.8 (H) 06/25/2007   Lab Results  Component Value Date   INR 2.34 (H) 03/23/2015   Lab Results  Component Value Date   CHOL 160 06/03/2018   HDL 50 06/03/2018   [place] 85 06/03/2018   TRIG 126 06/03/2018    GEN- The patient is well appearing obese male, alert and oriented x 3 today.   HEENT-head normocephalic, atraumatic, sclera clear, conjunctiva pink, hearing intact, trachea midline. Lungs- Clear to ausculation bilaterally, normal work of breathing Heart- Regular rate and rhythm, no murmurs, rubs or gallops  GI- soft, NT, ND, + BS Extremities- no clubbing, cyanosis, or edema MS- no significant deformity or atrophy Skin- no rash or lesion Psych- euthymic mood, full affect Neuro- strength and sensation are intact   EKG- SR HR 63, 1st degree AV block, incRBBB, PR 262, QRS 100, QTc 456  TEE- 09/2017-Study Conclusions  - Left ventricle: Systolic function was moderately reduced. The   estimated ejection fraction was in the range of 35% to 40%.   Diffuse hypokinesis. - Aortic valve: No evidence of vegetation. There was trivial   regurgitation. - Aortic root: The aortic root was mildly dilated. - Mitral valve: Prior procedures included surgical repair. The   findings are consistent with mild stenosis. There was mild   regurgitation. - Left atrium: The  atrium was severely dilated. No evidence of   thrombus in the atrial cavity or appendage. - Atrial septum: No defect or patent foramen ovale was identified.   There was an atrial septal aneurysm. - Tricuspid valve: No evidence of vegetation. - Pulmonic valve: No evidence of vegetation.  Impressions:  - Moderate global reduction in LV systolic function; mildly dilated   aortic root; trace AI; s/p MV repair with mild MS (mean gradient   5 mmHg) and mild MR; severe LAE; mild TR.  LHC- 2008-40% left main, 30% LAD, 30% RCA   Assessment and Plan: 1. Atypical atrial flutter S/p afib and left atrial flutter ablation with Dr Curt Bears on 01/21/19. Patient appears to be maintaining SR. Continue Eliquis 5 mg BID with no missed doses for at least 3 months post ablation. Continue Coreg 12.5 mg BID  This patients CHA2DS2-VASc Score and unadjusted Ischemic Stroke Rate (% per year) is equal to 3.2 % stroke rate/year from a score of 3  Above score calculated as 1 point each if present [CHF, HTN, DM, Vascular=MI/PAD/Aortic Plaque, Age if 65-74, or Male] Above score calculated as 2 points each if present [Age > 75, or Stroke/TIA/TE]   2. HTN Patient reports elevated BP readings at home although better here in the office today. Offered to titrate ARB. Patient would prefer to try lifestyle modification with weight loss, diet, exercise, and salt avoidance before changing medications. Pt to keep BP log for review at follow up.   3. CAD No anginal symptoms. Continue present therapy and risk factor modification.   4. Chronic systolic CHF No signs or symptoms of fluid overload. Weight stable.  5. Obesity Body mass index is 36.13 kg/m. Lifestyle modification was discussed and encouraged including regular physical activity and weight reduction.    Follow up with  Dr Curt Bears and Dr Stanford Breed as scheduled.    [facility] [address] [place], [place]  [address] [phone]

## 2019-02-19 NOTE — Telephone Encounter (Signed)
Pt is being seen in AFib clinic this morning.  I spoke with them and they are addressing at appt today.

## 2019-02-20 ENCOUNTER — Other Ambulatory Visit: Payer: Self-pay

## 2019-02-20 ENCOUNTER — Telehealth: Payer: Self-pay | Admitting: Family Medicine

## 2019-02-20 ENCOUNTER — Ambulatory Visit (INDEPENDENT_AMBULATORY_CARE_PROVIDER_SITE_OTHER): Payer: 59

## 2019-02-20 DIAGNOSIS — Z23 Encounter for immunization: Secondary | ICD-10-CM | POA: Diagnosis not present

## 2019-02-20 NOTE — Telephone Encounter (Signed)
Pt dropped off document to be filled out by provider (handicap parking placard -1 page) Pt would like to be called when document ready to pick up tel [phone]. Document put at front office tray under provider name.

## 2019-02-20 NOTE — Telephone Encounter (Signed)
Form received. Placed in folder for [place] to sign on Monday.

## 2019-03-31 ENCOUNTER — Other Ambulatory Visit: Payer: Self-pay | Admitting: Cardiology

## 2019-03-31 NOTE — Telephone Encounter (Signed)
Please review for refill. Thanks!  

## 2019-04-10 NOTE — Progress Notes (Signed)
HPI: FU mitral valve repair secondary to severe mitral regurgitation in February 2009. Note preoperative catheterization showed a 40% left main, 30% LAD and 30% right coronary artery. He has also had atrial flutter ablation in March 2009. Carotid dopplers in August of 2011 were normal. Patient has had elevated LFTs with statins in the past. Nuclear study 10/16 showed EF 49 with normal perfusion. TEE/DCCV of atrial flutterMay 2019; imagesshowed ejection fraction 35 to 40%, mildly dilated aortic root, mild mitral stenosis and mitral regurgitation; severe left atrial enlargement. Echocardiogram January 2020 showed ejection fraction 50 to 55%, prior mitral valve repair with no mitral regurgitation and severe left atrial enlargement.  Patient underwent left atypical atrial flutter and atrial fibrillation ablation September 2020.  Since I last saw him,he has dyspnea on exertion but no orthopnea, PND, pedal edema, chest pain or syncope.   Current Outpatient Medications  Medication Sig Dispense Refill  . amoxicillin (AMOXIL) 500 MG capsule Take 2,000 mg by mouth See admin instructions. Take 2000 mg by mouth 1 hour dental procedure    . carvedilol (COREG) 12.5 MG tablet Take 1 tablet (12.5 mg total) by mouth 2 (two) times daily. 60 tablet 2  . ELIQUIS 5 MG TABS tablet TAKE 1 TABLET BY MOUTH TWICE A DAY 180 tablet 1  . meloxicam (MOBIC) 15 MG tablet TAKE 1 TABLET BY MOUTH EVERY DAY (Patient taking differently: Take 15 mg by mouth daily as needed for pain. ) 90 tablet 0  . Menthol, Topical Analgesic, (ICY HOT EX) Apply 1 application topically daily as needed (muscle pain).    . Multiple Vitamins-Minerals (CENTRUM SILVER PO) Take 1 tablet by mouth 2 (two) times a week.    . sodium chloride (OCEAN) 0.65 % SOLN nasal spray Place 1 spray into both nostrils at bedtime as needed (nasal cleansing).    Marland Kitchen telmisartan (MICARDIS) 40 MG tablet Take 1 tablet (40 mg total) by mouth daily. (Patient taking  differently: Take 40 mg by mouth every evening. ) 90 tablet 3  . acetaminophen (TYLENOL) 325 MG tablet Take 2 tablets (650 mg total) by mouth every 4 (four) hours as needed for headache or mild pain.    . Bempedoic Acid (NEXLETOL) 180 MG TABS Take 180 mg by mouth daily. 21 tablet 0   No current facility-administered medications for this visit.      Past Medical History:  Diagnosis Date  . Atrial flutter ([place])    s/p ablation  . CAD (coronary artery disease)    cath 06/2007 40% LM  . CVD (cerebrovascular disease)    59% right ICA, 49% left ICA; h/oTIA; fu study 8/11 with normal carotids  . Dysrhythmia   . Gait abnormality   . Heart murmur   . History of bronchitis   . History of colonoscopy   . Horseshoe kidney   . Hyperlipidemia   . MR (mitral regurgitation)    severe; mitral valve repair in march 2009  . MVP (mitral valve prolapse)   . Osteoarthritis    both knees  . Shortness of breath dyspnea    increased exertion     Past Surgical History:  Procedure Laterality Date  . A-FLUTTER ABLATION N/A 01/29/2018   Procedure: A-FLUTTER ABLATION;  Surgeon: Constance Haw, MD;  Location: [facility];  Service: Cardiovascular;  Laterality: N/A;  . ATRIAL FIBRILLATION ABLATION N/A 01/21/2019   Procedure: ATRIAL FIBRILLATION ABLATION;  Surgeon: Constance Haw, MD;  Location: [facility];  Service: Cardiovascular;  Laterality: N/A;  . BACK SURGERY     secondary to ruptured disc  . CARDIAC CATHETERIZATION    . CARDIOVERSION N/A 09/23/2017   Procedure: CARDIOVERSION;  Surgeon: Lelon Perla, MD;  Location: [facility];  Service: Cardiovascular;  Laterality: N/A;  . heart ablation    . MITRAL VALVE REPAIR  06/2007  . TEE WITHOUT CARDIOVERSION N/A 09/23/2017   Procedure: TRANSESOPHAGEAL ECHOCARDIOGRAM (TEE);  Surgeon: Lelon Perla, MD;  Location: [facility];  Service: Cardiovascular;  Laterality: N/A;  . TONSILLECTOMY  1960  . TOTAL KNEE ARTHROPLASTY  Bilateral 03/09/2015   Procedure: TOTAL KNEE BILATERAL;  Surgeon: Gaynelle Arabian, MD;  Location: [facility];  Service: Orthopedics;  Laterality: Bilateral;    Social History   Socioeconomic History  . Marital status: Married    Spouse name: Not on file  . Number of children: Not on file  . Years of education: Not on file  . Highest education level: Not on file  Occupational History  . Occupation: [employer]: works from home  Social Needs  . Financial resource strain: Not on file  . Food insecurity    Worry: Not on file    Inability: Not on file  . Transportation needs    Medical: Not on file    Non-medical: Not on file  Tobacco Use  . Smoking status: Never Smoker  . Smokeless tobacco: Never Used  Substance and Sexual Activity  . Alcohol use: Yes    Alcohol/week: 0.0 standard drinks    Comment: occasional 1-2 wine drinks a month  . Drug use: No  . Sexual activity: Yes    Partners: Female  Lifestyle  . Physical activity    Days per week: Not on file    Minutes per session: Not on file  . Stress: Not on file  Relationships  . Social Herbalist on phone: Not on file    Gets together: Not on file    Attends religious service: Not on file    Active member of club or organization: Not on file    Attends meetings of clubs or organizations: Not on file    Relationship status: Not on file  . Intimate partner violence    Fear of current or ex partner: Not on file    Emotionally abused: Not on file    Physically abused: Not on file    Forced sexual activity: Not on file  Other Topics Concern  . Not on file  Social History Narrative   Exercise--  Total gym    Family History  Problem Relation Age of Onset  . Liver disease Father        cancer  . Hyperlipidemia Father   . Liver cancer Father   . Diabetes Other   . Colitis Daughter   . Crohn's disease Daughter   . Colon cancer Neg Hx   . Esophageal cancer Neg Hx   . Rectal cancer Neg Hx   .  Stomach cancer Neg Hx     ROS: Back pain but no fevers or chills, productive cough, hemoptysis, dysphasia, odynophagia, melena, hematochezia, dysuria, hematuria, rash, seizure activity, orthopnea, PND, pedal edema, claudication. Remaining systems are negative.  Physical Exam: Well-developed well-nourished in no acute distress.  Skin is warm and dry.  HEENT is normal.  Neck is supple.  Chest is clear to auscultation with normal expansion.  Cardiovascular exam is regular rate and rhythm.  Abdominal exam  nontender or distended. No masses palpated. Extremities show no edema. neuro grossly intact  ECG-sinus bradycardia at a rate of 59, first-degree AV block personally reviewed  A/P  1 prior mitral valve repair-continue SBE prophylaxis.  2 status post atypical left atrial flutter ablation and atrial fibrillation ablation-patient remains in sinus rhythm.  Continue apixaban.  Continue carvedilol at present dose.  3 hypertension-blood pressure is elevated.  Increase telmisartan to 80 mg daily.  Check potassium and renal function in 1 week.  4 coronary artery disease-patient is not on aspirin given need for apixaban.  5 history of cardiomyopathy-improved on most recent echocardiogram.  Continue ARB and beta-blocker.  This is felt previously secondary to tachycardia.  6 hyperlipidemia-he did not tolerate statins previously.  He has not started bempedoic acid at this point.  He will follow up in lipid clinic.  Check lipids and liver.  7 obesity-we discussed the importance of diet, exercise and weight loss.  Kirk Ruths, MD

## 2019-04-21 ENCOUNTER — Other Ambulatory Visit: Payer: Self-pay | Admitting: Cardiology

## 2019-04-21 ENCOUNTER — Ambulatory Visit (INDEPENDENT_AMBULATORY_CARE_PROVIDER_SITE_OTHER): Payer: 59 | Admitting: Cardiology

## 2019-04-21 ENCOUNTER — Encounter: Payer: Self-pay | Admitting: Cardiology

## 2019-04-21 ENCOUNTER — Other Ambulatory Visit: Payer: Self-pay

## 2019-04-21 VITALS — BP 150/88 | HR 60 | Temp 97.0°F | Ht 70.0 in | Wt 253.4 lb

## 2019-04-21 DIAGNOSIS — I483 Typical atrial flutter: Secondary | ICD-10-CM | POA: Diagnosis not present

## 2019-04-21 DIAGNOSIS — I10 Essential (primary) hypertension: Secondary | ICD-10-CM | POA: Diagnosis not present

## 2019-04-21 DIAGNOSIS — E785 Hyperlipidemia, unspecified: Secondary | ICD-10-CM

## 2019-04-21 DIAGNOSIS — R7989 Other specified abnormal findings of blood chemistry: Secondary | ICD-10-CM

## 2019-04-21 MED ORDER — TELMISARTAN 80 MG PO TABS: 80.0000 mg | ORAL_TABLET | Freq: Every day | ORAL | 3 refills | Status: DC

## 2019-04-21 NOTE — Telephone Encounter (Signed)
Outpatient Medication Detail   Disp Refills Start End   telmisartan (MICARDIS) 80 MG tablet 90 tablet 3 04/21/2019    Sig - Route: Take 1 tablet (80 mg total) by mouth daily. - Oral   Sent to pharmacy as: telmisartan (MICARDIS) 80 MG tablet   E-Prescribing Status: Receipt confirmed by pharmacy (04/21/2019 8:21 AM EST)   Pharmacy  [id number] [place], [place]

## 2019-04-21 NOTE — Patient Instructions (Signed)
Medication Instructions:  INCREASE TELMISARTAN TO 80 MG ONCE DAILY= 2 OF THE 40 MG TABLETS ONCE DAILY  *If you need a refill on your cardiac medications before your next appointment, please call your pharmacy*  Lab Work: Your physician recommends that you return for lab work in: [place]  If you have labs (blood work) drawn today and your tests are completely normal, you will receive your results only by: Marland Kitchen MyChart Message (if you have MyChart) OR . A paper copy in the mail If you have any lab test that is abnormal or we need to change your treatment, we will call you to review the results.  Follow-Up: At [facility], you and your health needs are our priority.  As part of our continuing mission to provide you with exceptional heart care, we have created designated Provider Care Teams.  These Care Teams include your primary Cardiologist (physician) and Advanced Practice Providers (APPs -  Physician Assistants and Nurse Practitioners) who all work together to provide you with the care you need, when you need it.  Your next appointment:   6 month(s)  The format for your next appointment:   Either In Person or Virtual  Provider:   Your physician wants you to follow-up in: [place] will receive a reminder letter in the mail two months in advance. If you don't receive a letter, please call our office to schedule the follow-up appointment.

## 2019-04-23 ENCOUNTER — Other Ambulatory Visit: Payer: Self-pay

## 2019-04-23 ENCOUNTER — Encounter: Payer: Self-pay | Admitting: Cardiology

## 2019-04-23 ENCOUNTER — Ambulatory Visit (INDEPENDENT_AMBULATORY_CARE_PROVIDER_SITE_OTHER): Payer: 59 | Admitting: Cardiology

## 2019-04-23 VITALS — BP 124/82 | HR 60 | Ht 70.0 in | Wt 251.0 lb

## 2019-04-23 DIAGNOSIS — I484 Atypical atrial flutter: Secondary | ICD-10-CM

## 2019-04-23 NOTE — Progress Notes (Signed)
Electrophysiology Office Note   Date:  04/23/2019   ID:  [name], DOB 12-09-1950, MRN [id number]  PCP:  Carollee Herter, Alferd Apa, DO  Cardiologist:  Stanford Breed Primary Electrophysiologist:  Davita Sublett Meredith Leeds, MD    No chief complaint on file.    History of Present Illness: [name] is a 68 y.o. male who is being seen today for the evaluation of atrial flutter at the request of Fabian Sharp. Presenting today for electrophysiology evaluation.  Is a history of CAD, atrial flutter status post ablation, mitral regurgitation status post repair in 2009.  He had his atrial flutter ablation in 2009.  He was seen 08/2017 in atrial flutter with heart rates of 126.  He was referred for cardioversion.  He is currently on Eliquis and metoprolol.  Recent TEE showed an EF of 35 to 40% which is a reduction from his prior echo in 2018.  He presented to cardiology clinic 12/12/2017 complaining of palpitations.  EKG showed atrial flutter.  Had EP study 01/29/18 which showed a LA flutter. He was cardioverted at that time.  He is now status post ablation of atypical atrial flutter and atrial fibrillation 01/21/2019.  Today, denies symptoms of palpitations, chest pain, shortness of breath, orthopnea, PND, lower extremity edema, claudication, dizziness, presyncope, syncope, bleeding, or neurologic sequela. The patient is tolerating medications without difficulties.  Overall he is doing well.  He has had no further episodes of atrial flutter.   Past Medical History:  Diagnosis Date  . Atrial flutter ([place])    s/p ablation  . CAD (coronary artery disease)    cath 06/2007 40% LM  . CVD (cerebrovascular disease)    59% right ICA, 49% left ICA; h/oTIA; fu study 8/11 with normal carotids  . Dysrhythmia   . Gait abnormality   . Heart murmur   . History of bronchitis   . History of colonoscopy   . Horseshoe kidney   . Hyperlipidemia   . MR (mitral regurgitation)    severe; mitral valve repair in march 2009   . MVP (mitral valve prolapse)   . Osteoarthritis    both knees  . Shortness of breath dyspnea    increased exertion    Past Surgical History:  Procedure Laterality Date  . A-FLUTTER ABLATION N/A 01/29/2018   Procedure: A-FLUTTER ABLATION;  Surgeon: Constance Haw, MD;  Location: [facility];  Service: Cardiovascular;  Laterality: N/A;  . ATRIAL FIBRILLATION ABLATION N/A 01/21/2019   Procedure: ATRIAL FIBRILLATION ABLATION;  Surgeon: Constance Haw, MD;  Location: [facility];  Service: Cardiovascular;  Laterality: N/A;  . BACK SURGERY     secondary to ruptured disc  . CARDIAC CATHETERIZATION    . CARDIOVERSION N/A 09/23/2017   Procedure: CARDIOVERSION;  Surgeon: Lelon Perla, MD;  Location: [facility]COPY;  Service: Cardiovascular;  Laterality: N/A;  . heart ablation    . MITRAL VALVE REPAIR  06/2007  . TEE WITHOUT CARDIOVERSION N/A 09/23/2017   Procedure: TRANSESOPHAGEAL ECHOCARDIOGRAM (TEE);  Surgeon: Lelon Perla, MD;  Location: [facility];  Service: Cardiovascular;  Laterality: N/A;  . TONSILLECTOMY  1960  . TOTAL KNEE ARTHROPLASTY Bilateral 03/09/2015   Procedure: TOTAL KNEE BILATERAL;  Surgeon: Gaynelle Arabian, MD;  Location: [facility];  Service: Orthopedics;  Laterality: Bilateral;     Current Outpatient Medications  Medication Sig Dispense Refill  . amoxicillin (AMOXIL) 500 MG capsule Take 2,000 mg by mouth See admin instructions. Take 2000 mg by mouth  1 hour dental procedure    . carvedilol (COREG) 12.5 MG tablet TAKE 1 TABLET (12.5 MG TOTAL) BY MOUTH 2 (TWO) TIMES DAILY. 180 tablet 2  . ELIQUIS 5 MG TABS tablet TAKE 1 TABLET BY MOUTH TWICE A DAY 180 tablet 1  . meloxicam (MOBIC) 15 MG tablet Take 15 mg by mouth daily as needed for pain.    . Menthol, Topical Analgesic, (ICY HOT EX) Apply 1 application topically daily as needed (muscle pain).    . Multiple Vitamins-Minerals (CENTRUM SILVER PO) Take 1 tablet by mouth 2 (two) times a week.    .  sodium chloride (OCEAN) 0.65 % SOLN nasal spray Place 1 spray into both nostrils at bedtime as needed (nasal cleansing).    Marland Kitchen telmisartan (MICARDIS) 80 MG tablet Take 1 tablet (80 mg total) by mouth daily. 90 tablet 3   No current facility-administered medications for this visit.     Allergies:   Statins, Codeine, and Tape   Social History:  The patient  reports that he has never smoked. He has never used smokeless tobacco. He reports current alcohol use. He reports that he does not use drugs.   Family History:  The patient's family history includes Colitis in his daughter; Crohn's disease in his daughter; Diabetes in an other family member; Hyperlipidemia in his father; Liver cancer in his father; Liver disease in his father.   ROS:  Please see the history of present illness.   Otherwise, review of systems is positive for none.   All other systems are reviewed and negative.   PHYSICAL EXAM: VS:  BP 124/82   Pulse 60   Ht 5' 10"  (1.778 m)   Wt 251 lb (113.9 kg)   SpO2 97%   BMI 36.01 kg/m  , BMI Body mass index is 36.01 kg/m. GEN: Well nourished, well developed, in no acute distress  HEENT: normal  Neck: no JVD, carotid bruits, or masses Cardiac: RRR; no murmurs, rubs, or gallops,no edema  Respiratory:  clear to auscultation bilaterally, normal work of breathing GI: soft, nontender, nondistended, + BS MS: no deformity or atrophy  Skin: warm and dry Neuro:  Strength and sensation are intact Psych: euthymic mood, full affect  EKG:  EKG is not ordered today. Personal review of the ekg ordered 04/21/19 shows sinus rhythm, first-degree AV block   Recent Labs: 10/27/2018: ALT 100 01/13/2019: BUN 19; Hemoglobin 16.5; Platelets 200; Potassium 4.6; Sodium 142 01/14/2019: Creatinine, Ser 1.00    Lipid Panel     Component Value Date/Time   CHOL 160 06/03/2018 0858   TRIG 126 06/03/2018 0858   HDL 50 06/03/2018 0858   CHOLHDL 3.2 06/03/2018 0858   CHOLHDL 3 07/15/2017 0855   VLDL  18.0 07/15/2017 0855   LDLCALC 85 06/03/2018 0858   LDLDIRECT 147.0 01/16/2016 0908     Wt Readings from Last 3 Encounters:  04/23/19 251 lb (113.9 kg)  04/21/19 253 lb 6.4 oz (114.9 kg)  02/19/19 251 lb 12.8 oz (114.2 kg)      Other studies Reviewed: Additional studies/ records that were reviewed today include: TEE 06/13/18 Review of the above records today demonstrates:  - Left ventricle: The cavity size was normal. Wall thickness was   normal. Systolic function was normal. The estimated ejection   fraction was in the range of 50% to 55%. The study is not   technically sufficient to allow evaluation of LV diastolic   function. - Mitral valve: Post MV repair ? neo chord  no significant residual   MR. - Left atrium: The atrium was severely dilated. - Atrial septum: No defect or patent foramen ovale was identified.  ASSESSMENT AND PLAN:  1.  Atypical atrial flutter: Status post ablation.  Currently on Eliquis.  Atrial flutter terminated with ablation from the right upper pulmonary vein to the mitral valve.  Mains in sinus rhythm.  No changes.  This patients CHA2DS2-VASc Score and unadjusted Ischemic Stroke Rate (% per year) is equal to 3.2 % stroke rate/year from a score of 3  Above score calculated as 1 point each if present [CHF, HTN, DM, Vascular=MI/PAD/Aortic Plaque, Age if 65-74, or Male] Above score calculated as 2 points each if present [Age > 75, or Stroke/TIA/TE]    2.  Hypertension: Currently well controlled.  He has had elevated blood pressures at home.  I have asked him to correlate this with his doctor's office.  3.  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  Coronary artery disease: Mild disease without chest pain  Current medicines are reviewed at length with the patient today.   The patient does not have concerns regarding his medicines.  The following changes were made today: None  Labs/ tests ordered today include:  No orders of  the defined types were placed in this encounter.    Disposition:   FU with Kanani Mowbray 6 months  Signed, Shandiin Eisenbeis Meredith Leeds, MD  04/23/2019 9:44 AM     [facility] [address] [place] [address] [phone] (office) [phone] (fax)

## 2019-04-28 LAB — COMPREHENSIVE METABOLIC PANEL
ALT: 92 IU/L — ABNORMAL HIGH (ref 0–44)
AST: 40 IU/L (ref 0–40)
Albumin/Globulin Ratio: 2.1 (ref 1.2–2.2)
Albumin: 4.5 g/dL (ref 3.8–4.8)
Alkaline Phosphatase: 54 IU/L (ref 39–117)
BUN/Creatinine Ratio: 22 (ref 10–24)
BUN: 20 mg/dL (ref 8–27)
Bilirubin Total: 0.8 mg/dL (ref 0.0–1.2)
CO2: 20 mmol/L (ref 20–29)
Calcium: 9.4 mg/dL (ref 8.6–10.2)
Chloride: 100 mmol/L (ref 96–106)
Creatinine, Ser: 0.91 mg/dL (ref 0.76–1.27)
GFR calc Af Amer: 101 mL/min/{1.73_m2} (ref >59)
GFR calc non Af Amer: 88 mL/min/{1.73_m2} (ref >59)
Globulin, Total: 2.1 g/dL (ref 1.5–4.5)
Glucose: 111 mg/dL — ABNORMAL HIGH (ref 65–99)
Potassium: 4.6 mmol/L (ref 3.5–5.2)
Sodium: 137 mmol/L (ref 134–144)
Total Protein: 6.6 g/dL (ref 6.0–8.5)

## 2019-04-28 LAB — LIPID PANEL
Chol/HDL Ratio: 4.7 ratio (ref 0.0–5.0)
Cholesterol, Total: 226 mg/dL — ABNORMAL HIGH (ref 100–199)
HDL: 48 mg/dL (ref >39)
LDL Chol Calc (NIH): 151 mg/dL — ABNORMAL HIGH (ref 0–99)
Triglycerides: 148 mg/dL (ref 0–149)
VLDL Cholesterol Cal: 27 mg/dL (ref 5–40)

## 2019-04-29 ENCOUNTER — Telehealth: Payer: Self-pay | Admitting: Pharmacist

## 2019-04-29 NOTE — Telephone Encounter (Signed)
Medication Samples have been provided to the patient.  Drug name: REPATHA SURECLICK    Strength: [id number]        Qty: 2  LOT: [id number]  Exp.Date: 05/2021  Marybell Robards Rodriguez-Guzman PharmD, BCPS, [place] [address] [place] [address] 04/29/2019 3:24 PM

## 2019-04-30 ENCOUNTER — Ambulatory Visit: Payer: 59

## 2019-05-07 ENCOUNTER — Telehealth: Payer: Self-pay

## 2019-05-07 MED ORDER — REPATHA SURECLICK 140 MG/ML ~~LOC~~ SOAJ: 140.0000 mg | SUBCUTANEOUS | 6 refills | Status: DC

## 2019-05-07 NOTE — Telephone Encounter (Signed)
Called and spoke w/pt regarding the approval of repatha and rx sent pt voiced understanding

## 2019-06-09 ENCOUNTER — Encounter: Payer: Self-pay | Admitting: Family Medicine

## 2019-06-10 ENCOUNTER — Encounter: Payer: Self-pay | Admitting: Family Medicine

## 2019-06-10 ENCOUNTER — Ambulatory Visit (INDEPENDENT_AMBULATORY_CARE_PROVIDER_SITE_OTHER): Payer: 59 | Admitting: Family Medicine

## 2019-06-10 ENCOUNTER — Other Ambulatory Visit: Payer: Self-pay

## 2019-06-10 VITALS — BP 137/85 | HR 54 | Ht 70.0 in

## 2019-06-10 DIAGNOSIS — J069 Acute upper respiratory infection, unspecified: Secondary | ICD-10-CM

## 2019-06-10 DIAGNOSIS — Z20822 Contact with and (suspected) exposure to covid-19: Secondary | ICD-10-CM

## 2019-06-10 MED ORDER — FLUTICASONE PROPIONATE 50 MCG/ACT NA SUSP: 2.0000 | Freq: Every day | NASAL | 6 refills | Status: DC

## 2019-06-10 MED ORDER — MELOXICAM 15 MG PO TABS: 15.0000 mg | ORAL_TABLET | Freq: Every day | ORAL | 2 refills | Status: DC | PRN

## 2019-06-10 MED ORDER — LEVOCETIRIZINE DIHYDROCHLORIDE 5 MG PO TABS: 5.0000 mg | ORAL_TABLET | Freq: Every evening | ORAL | 5 refills | Status: DC

## 2019-06-10 NOTE — Progress Notes (Signed)
Virtual Visit via Video Note  I connected with [name] on 06/10/19 at 10:20 AM EST by a video enabled telemedicine application and verified that I am speaking with the correct person using two identifiers.  Location: Patient: home  Provider: home    I discussed the limitations of evaluation and management by telemedicine and the availability of in person appointments. The patient expressed understanding and agreed to proceed.  History of Present Illness: Pt is home c/o low fever, cough and congestion since the 18th Pt is taking tylenol only  No sob No bodyaches   Just feels like he has a head cold     Observations/Objective: .100.6   Vitals:   06/10/19 1018  BP: 137/85  Pulse: (!) 54   Pt is in NAD  Assessment and Plan: 1. Viral upper respiratory tract infection mucinex for cough covid test app for tomorrow  - levocetirizine (XYZAL) 5 MG tablet; Take 1 tablet (5 mg total) by mouth every evening.  Dispense: 30 tablet; Refill: 5 - fluticasone (FLONASE) 50 MCG/ACT nasal spray; Place 2 sprays into both nostrils daily.  Dispense: 16 g; Refill: 6   Follow Up Instructions:    I discussed the assessment and treatment plan with the patient. The patient was provided an opportunity to ask questions and all were answered. The patient agreed with the plan and demonstrated an understanding of the instructions.   The patient was advised to call back or seek an in-person evaluation if the symptoms worsen or if the condition fails to improve as anticipated.    Ann Held, DO

## 2019-06-10 NOTE — Patient Instructions (Signed)
COVID-19 Vaccine Information can be found at: [email or web address] For questions related to vaccine distribution or appointments, please email [email or web address] or call [phone].    

## 2019-06-11 ENCOUNTER — Ambulatory Visit: Payer: 59 | Attending: Internal Medicine

## 2019-06-11 DIAGNOSIS — Z20822 Contact with and (suspected) exposure to covid-19: Secondary | ICD-10-CM

## 2019-06-12 LAB — NOVEL CORONAVIRUS, NAA: SARS-CoV-2, NAA: DETECTED — AB

## 2019-06-13 ENCOUNTER — Other Ambulatory Visit: Payer: Self-pay | Admitting: Internal Medicine

## 2019-06-13 DIAGNOSIS — U071 COVID-19: Secondary | ICD-10-CM

## 2019-06-13 NOTE — Progress Notes (Unsigned)
  I connected by phone with [name] on 06/13/2019 at 4:00 PM to discuss the potential use of an new treatment for mild to moderate COVID-19 viral infection in non-hospitalized patients.  This patient is a 69 y.o. male that meets the FDA criteria for Emergency Use Authorization of bamlanivimab or casirivimab\imdevimab.  Has a (+) direct SARS-CoV-2 viral test result  Has mild or moderate COVID-19   Is ? 67 years of age and weighs ? 40 kg  Is NOT hospitalized due to COVID-19  Is NOT requiring oxygen therapy or requiring an increase in baseline oxygen flow rate due to COVID-19  Is within 10 days of symptom onset  Has at least one of the high risk factor(s) for progression to severe COVID-19 and/or hospitalization as defined in EUA.  Specific high risk criteria : >/= 67 yo   I have spoken and communicated the following to the patient or parent/caregiver:  1. FDA has authorized the emergency use of bamlanivimab and casirivimab\imdevimab for the treatment of mild to moderate COVID-19 in adults and pediatric patients with positive results of direct SARS-CoV-2 viral testing who are 62 years of age and older weighing at least 40 kg, and who are at high risk for progressing to severe COVID-19 and/or hospitalization.  2. The significant known and potential risks and benefits of bamlanivimab and casirivimab\imdevimab, and the extent to which such potential risks and benefits are unknown.  3. Information on available alternative treatments and the risks and benefits of those alternatives, including clinical trials.  4. Patients treated with bamlanivimab and casirivimab\imdevimab should continue to self-isolate and use infection control measures (e.g., wear mask, isolate, social distance, avoid sharing personal items, clean and disinfect "high touch" surfaces, and frequent handwashing) according to CDC guidelines.   5. The patient or parent/caregiver has the option to accept or refuse  bamlanivimab or casirivimab\imdevimab .  After reviewing this information with the patient, The patient agreed to proceed with receiving the bamlanimivab infusion and will be provided a copy of the Fact sheet prior to receiving the infusion.Alan Ripper, NP-C [place]

## 2019-06-15 ENCOUNTER — Encounter: Payer: Self-pay | Admitting: Family Medicine

## 2019-06-17 ENCOUNTER — Ambulatory Visit (HOSPITAL_COMMUNITY): Payer: 59

## 2019-06-26 ENCOUNTER — Other Ambulatory Visit: Payer: Self-pay

## 2019-06-29 ENCOUNTER — Encounter: Payer: Self-pay | Admitting: Family Medicine

## 2019-06-29 ENCOUNTER — Ambulatory Visit (INDEPENDENT_AMBULATORY_CARE_PROVIDER_SITE_OTHER): Payer: 59 | Admitting: Family Medicine

## 2019-06-29 ENCOUNTER — Ambulatory Visit (HOSPITAL_BASED_OUTPATIENT_CLINIC_OR_DEPARTMENT_OTHER)
Admission: RE | Admit: 2019-06-29 | Discharge: 2019-06-29 | Disposition: A | Discharge status: Home / Self Care | Payer: 59 | Source: Ambulatory Visit | Attending: Family Medicine | Admitting: Family Medicine

## 2019-06-29 ENCOUNTER — Other Ambulatory Visit: Payer: Self-pay

## 2019-06-29 VITALS — BP 158/88 | HR 67 | Temp 97.6°F | Resp 18 | Ht 70.0 in | Wt 255.4 lb

## 2019-06-29 DIAGNOSIS — Z125 Encounter for screening for malignant neoplasm of prostate: Secondary | ICD-10-CM | POA: Diagnosis not present

## 2019-06-29 DIAGNOSIS — M25812 Other specified joint disorders, left shoulder: Secondary | ICD-10-CM | POA: Insufficient documentation

## 2019-06-29 DIAGNOSIS — I10 Essential (primary) hypertension: Secondary | ICD-10-CM

## 2019-06-29 DIAGNOSIS — E7800 Pure hypercholesterolemia, unspecified: Secondary | ICD-10-CM

## 2019-06-29 DIAGNOSIS — Z0001 Encounter for general adult medical examination with abnormal findings: Secondary | ICD-10-CM | POA: Diagnosis not present

## 2019-06-29 DIAGNOSIS — Z23 Encounter for immunization: Secondary | ICD-10-CM

## 2019-06-29 DIAGNOSIS — I484 Atypical atrial flutter: Secondary | ICD-10-CM

## 2019-06-29 DIAGNOSIS — I420 Dilated cardiomyopathy: Secondary | ICD-10-CM

## 2019-06-29 DIAGNOSIS — Z0000 Encounter for general adult medical examination without abnormal findings: Secondary | ICD-10-CM

## 2019-06-29 LAB — CBC WITH DIFFERENTIAL/PLATELET
Basophils Absolute: 0 10*3/uL (ref 0.0–0.1)
Basophils Relative: 0.6 % (ref 0.0–3.0)
Eosinophils Absolute: 0.2 10*3/uL (ref 0.0–0.7)
Eosinophils Relative: 3.2 % (ref 0.0–5.0)
HCT: 44.3 % (ref 39.0–52.0)
Hemoglobin: 15.1 g/dL (ref 13.0–17.0)
Lymphocytes Relative: 29.2 % (ref 12.0–46.0)
Lymphs Abs: 1.6 10*3/uL (ref 0.7–4.0)
MCHC: 34.1 g/dL (ref 30.0–36.0)
MCV: 92.6 fl (ref 78.0–100.0)
Monocytes Absolute: 0.6 10*3/uL (ref 0.1–1.0)
Monocytes Relative: 11.2 % (ref 3.0–12.0)
Neutro Abs: 3 10*3/uL (ref 1.4–7.7)
Neutrophils Relative %: 55.8 % (ref 43.0–77.0)
Platelets: 185 10*3/uL (ref 150.0–400.0)
RBC: 4.78 Mil/uL (ref 4.22–5.81)
RDW: 12.2 % (ref 11.5–15.5)
WBC: 5.3 10*3/uL (ref 4.0–10.5)

## 2019-06-29 LAB — COMPREHENSIVE METABOLIC PANEL
ALT: 116 U/L — ABNORMAL HIGH (ref 0–53)
AST: 63 U/L — ABNORMAL HIGH (ref 0–37)
Albumin: 4.6 g/dL (ref 3.5–5.2)
Alkaline Phosphatase: 52 U/L (ref 39–117)
BUN: 15 mg/dL (ref 6–23)
CO2: 26 mEq/L (ref 19–32)
Calcium: 9.5 mg/dL (ref 8.4–10.5)
Chloride: 103 mEq/L (ref 96–112)
Creatinine, Ser: 0.96 mg/dL (ref 0.40–1.50)
GFR: 78.32 mL/min (ref >60.00)
Glucose, Bld: 93 mg/dL (ref 70–99)
Potassium: 4.3 mEq/L (ref 3.5–5.1)
Sodium: 138 mEq/L (ref 135–145)
Total Bilirubin: 1.1 mg/dL (ref 0.2–1.2)
Total Protein: 7 g/dL (ref 6.0–8.3)

## 2019-06-29 LAB — LIPID PANEL
Cholesterol: 136 mg/dL (ref 0–200)
HDL: 45.3 mg/dL (ref >39.00)
LDL Cholesterol: 61 mg/dL (ref 0–99)
NonHDL: 90.79
Total CHOL/HDL Ratio: 3
Triglycerides: 150 mg/dL — ABNORMAL HIGH (ref 0.0–149.0)
VLDL: 30 mg/dL (ref 0.0–40.0)

## 2019-06-29 LAB — PSA: PSA: 1.24 ng/mL (ref 0.10–4.00)

## 2019-06-29 NOTE — Assessment & Plan Note (Signed)
Per cardiology 

## 2019-06-29 NOTE — Assessment & Plan Note (Signed)
Check xray Non tender but obvious abnormality

## 2019-06-29 NOTE — Assessment & Plan Note (Addendum)
Encouraged heart healthy diet, increase exercise, avoid trans fats, consider a krill oil cap daily con't raptha----per lipid clinic

## 2019-06-29 NOTE — Assessment & Plan Note (Signed)
Well controlled, no changes to meds. Encouraged heart healthy diet such as the DASH diet and exercise as tolerated.  °

## 2019-06-29 NOTE — Patient Instructions (Signed)

## 2019-06-29 NOTE — Assessment & Plan Note (Signed)
ghm utd Check labs  See AVS-----  Needs to wait 90 days to get covid vaccine

## 2019-06-29 NOTE — Progress Notes (Signed)
Patient ID: [name], male    DOB: 1950/09/30  Age: 69 y.o. MRN: [id number]    Subjective:  Subjective  HPI [name] presents for cpe    He c/o know on L shoulder   No other complaints   Review of Systems  Constitutional: Negative.  Negative for fever.  HENT: Negative for congestion, ear pain, hearing loss, nosebleeds, postnasal drip, rhinorrhea, sinus pressure, sneezing and tinnitus.   Eyes: Negative for photophobia, discharge, itching and visual disturbance.  Respiratory: Negative.  Negative for shortness of breath.   Cardiovascular: Negative.  Negative for chest pain, palpitations and leg swelling.  Gastrointestinal: Negative for abdominal distention, abdominal pain, anal bleeding, blood in stool, constipation and nausea.  Endocrine: Negative.   Genitourinary: Negative.  Negative for dysuria and frequency.  Musculoskeletal: Negative.   Skin: Negative.  Negative for rash.  Allergic/Immunologic: Negative.  Negative for environmental allergies.  Neurological: Negative for dizziness, weakness, light-headedness, numbness and headaches.  Psychiatric/Behavioral: Negative for agitation, confusion, decreased concentration, dysphoric mood, sleep disturbance and suicidal ideas. The patient is not nervous/anxious.     History Past Medical History:  Diagnosis Date  . Atrial flutter ([place])    s/p ablation  . CAD (coronary artery disease)    cath 06/2007 40% LM  . CVD (cerebrovascular disease)    59% right ICA, 49% left ICA; h/oTIA; fu study 8/11 with normal carotids  . Dysrhythmia   . Gait abnormality   . Heart murmur   . History of bronchitis   . History of colonoscopy   . Horseshoe kidney   . Hyperlipidemia   . MR (mitral regurgitation)    severe; mitral valve repair in march 2009  . MVP (mitral valve prolapse)   . Osteoarthritis    both knees  . Shortness of breath dyspnea    increased exertion     He has a past surgical history that includes Tonsillectomy (1960);  Mitral valve repair (06/2007); heart ablation; Cardiac catheterization; Back surgery; Total knee arthroplasty (Bilateral, 03/09/2015); Cardioversion (N/A, 09/23/2017); TEE without cardioversion (N/A, 09/23/2017); A-FLUTTER ABLATION (N/A, 01/29/2018); and ATRIAL FIBRILLATION ABLATION (N/A, 01/21/2019).   His family history includes Colitis in his daughter; Crohn's disease in his daughter; Diabetes in an other family member; Hyperlipidemia in his father; Liver cancer in his father; Liver disease in his father.He reports that he has never smoked. He has never used smokeless tobacco. He reports current alcohol use. He reports that he does not use drugs.  Current Outpatient Medications on File Prior to Visit  Medication Sig Dispense Refill  . amoxicillin (AMOXIL) 500 MG capsule Take 2,000 mg by mouth See admin instructions. Take 2000 mg by mouth 1 hour dental procedure    . carvedilol (COREG) 12.5 MG tablet TAKE 1 TABLET (12.5 MG TOTAL) BY MOUTH 2 (TWO) TIMES DAILY. 180 tablet 2  . ELIQUIS 5 MG TABS tablet TAKE 1 TABLET BY MOUTH TWICE A DAY 180 tablet 1  . Evolocumab (REPATHA SURECLICK) 332 MG/ML SOAJ Inject 140 mg into the skin every 14 (fourteen) days. 2 pen 6  . fluticasone (FLONASE) 50 MCG/ACT nasal spray Place 2 sprays into both nostrils daily. 16 g 6  . levocetirizine (XYZAL) 5 MG tablet Take 1 tablet (5 mg total) by mouth every evening. 30 tablet 5  . meloxicam (MOBIC) 15 MG tablet Take 1 tablet (15 mg total) by mouth daily as needed for pain. 30 tablet 2  . Menthol, Topical Analgesic, (ICY HOT EX) Apply 1 application topically daily  as needed (muscle pain).    . Multiple Vitamins-Minerals (CENTRUM SILVER PO) Take 1 tablet by mouth 2 (two) times a week.    . sodium chloride (OCEAN) 0.65 % SOLN nasal spray Place 1 spray into both nostrils at bedtime as needed (nasal cleansing).    Marland Kitchen telmisartan (MICARDIS) 80 MG tablet Take 1 tablet (80 mg total) by mouth daily. 90 tablet 3   No current  facility-administered medications on file prior to visit.     Objective:  Objective  Physical Exam Vitals and nursing note reviewed.  Constitutional:      General: He is not in acute distress.    Appearance: He is well-developed. He is not diaphoretic.  HENT:     Head: Normocephalic and atraumatic.     Right Ear: External ear normal.     Left Ear: External ear normal.     Nose: Nose normal.     Mouth/Throat:     Pharynx: No oropharyngeal exudate.  Eyes:     General:        Right eye: No discharge.        Left eye: No discharge.     Conjunctiva/sclera: Conjunctivae normal.     Pupils: Pupils are equal, round, and reactive to light.  Neck:     Thyroid: No thyromegaly.     Vascular: No JVD.  Cardiovascular:     Rate and Rhythm: Normal rate and regular rhythm.     Heart sounds: No murmur. No friction rub. No gallop.   Pulmonary:     Effort: Pulmonary effort is normal. No respiratory distress.     Breath sounds: Normal breath sounds. No wheezing or rales.  Chest:     Chest wall: No tenderness.  Abdominal:     General: Bowel sounds are normal. There is no distension.     Palpations: Abdomen is soft. There is no mass.     Tenderness: There is no abdominal tenderness. There is no guarding or rebound.  Genitourinary:    Penis: Normal.      Prostate: Normal.     Rectum: Normal. Guaiac result negative.  Musculoskeletal:        General: No tenderness. Normal range of motion.     Cervical back: Normal range of motion and neck supple.  Lymphadenopathy:     Cervical: No cervical adenopathy.  Skin:    General: Skin is warm and dry.     Coloration: Skin is not pale.     Findings: No erythema or rash.  Neurological:     Mental Status: He is alert and oriented to person, place, and time.     Motor: No abnormal muscle tone.     Deep Tendon Reflexes: Reflexes are normal and symmetric. Reflexes normal.  Psychiatric:        Behavior: Behavior normal.        Thought Content: Thought  content normal.        Judgment: Judgment normal.    BP (!) 158/88 (BP Location: Right Arm, Patient Position: Sitting, Cuff Size: Large)   Pulse 67   Temp 97.6 F (36.4 C) (Temporal)   Resp 18   Ht 5' 10"  (1.778 m)   Wt 255 lb 6.4 oz (115.8 kg)   SpO2 96%   BMI 36.65 kg/m  Wt Readings from Last 3 Encounters:  06/29/19 255 lb 6.4 oz (115.8 kg)  04/23/19 251 lb (113.9 kg)  04/21/19 253 lb 6.4 oz (114.9 kg)     Lab Results  Component Value Date   WBC 5.3 06/29/2019   HGB 15.1 06/29/2019   HCT 44.3 06/29/2019   PLT 185.0 06/29/2019   GLUCOSE 93 06/29/2019   CHOL 136 06/29/2019   TRIG 150.0 (H) 06/29/2019   HDL 45.30 06/29/2019   LDLDIRECT 147.0 01/16/2016   LDLCALC 61 06/29/2019   ALT 116 (H) 06/29/2019   AST 63 (H) 06/29/2019   NA 138 06/29/2019   K 4.3 06/29/2019   CL 103 06/29/2019   CREATININE 0.96 06/29/2019   BUN 15 06/29/2019   CO2 26 06/29/2019   TSH 2.040 12/12/2017   PSA 1.24 06/29/2019   INR 2.34 (H) 03/23/2015   HGBA1C 6.1 10/01/2016    No results found.   Assessment & Plan:  Plan  I am having [name] maintain his amoxicillin, Multiple Vitamins-Minerals (CENTRUM SILVER PO), sodium chloride, (Menthol, Topical Analgesic, (ICY HOT EX)), Eliquis, carvedilol, telmisartan, Repatha SureClick, meloxicam, levocetirizine, and fluticasone.  No orders of the defined types were placed in this encounter.   Problem List Items Addressed This Visit      Unprioritized   Atrial flutter [facility])    Per cardiology      Dilated cardiomyopathy [facility])    Per cardiology      HYPERLIPIDEMIA, FAMILIAL    Encouraged heart healthy diet, increase exercise, avoid trans fats, consider a krill oil cap daily con't raptha----per lipid clinic       Hypertension    Well controlled, no changes to meds. Encouraged heart healthy diet such as the DASH diet and exercise as tolerated.        Relevant Orders   Lipid panel (Completed)   Comprehensive metabolic panel  (Completed)   Mass of joint of left shoulder    Check xray Non tender but obvious abnormality       Relevant Orders   DG Shoulder Left (Completed)   Preventative health care - Primary    ghm utd Check labs  See AVS-----  Needs to wait 90 days to get covid vaccine       Relevant Orders   PSA (Completed)   Lipid panel (Completed)   CBC with Differential/Platelet (Completed)   Comprehensive metabolic panel (Completed)   PSA    Other Visit Diagnoses    Need for pneumococcal vaccination       Relevant Orders   Pneumococcal conjugate vaccine 13-valent IM (Completed)      Follow-up: Return in about 6 months (around 12/27/2019) for hyperlipidemia, hypertension.  Ann Held, DO

## 2019-07-03 ENCOUNTER — Other Ambulatory Visit: Payer: Self-pay | Admitting: Family Medicine

## 2019-07-03 DIAGNOSIS — R7989 Other specified abnormal findings of blood chemistry: Secondary | ICD-10-CM

## 2019-07-09 ENCOUNTER — Ambulatory Visit (INDEPENDENT_AMBULATORY_CARE_PROVIDER_SITE_OTHER): Payer: 59 | Admitting: Family Medicine

## 2019-07-09 ENCOUNTER — Encounter: Payer: Self-pay | Admitting: Family Medicine

## 2019-07-09 ENCOUNTER — Ambulatory Visit (HOSPITAL_BASED_OUTPATIENT_CLINIC_OR_DEPARTMENT_OTHER)
Admission: RE | Admit: 2019-07-09 | Discharge: 2019-07-09 | Disposition: A | Discharge status: Home / Self Care | Payer: 59 | Source: Ambulatory Visit | Attending: Family Medicine | Admitting: Family Medicine

## 2019-07-09 ENCOUNTER — Other Ambulatory Visit: Payer: Self-pay

## 2019-07-09 DIAGNOSIS — R05 Cough: Secondary | ICD-10-CM | POA: Insufficient documentation

## 2019-07-09 DIAGNOSIS — J40 Bronchitis, not specified as acute or chronic: Secondary | ICD-10-CM

## 2019-07-09 DIAGNOSIS — R059 Cough, unspecified: Secondary | ICD-10-CM

## 2019-07-09 MED ORDER — AZITHROMYCIN 250 MG PO TABS: ORAL_TABLET | ORAL | 0 refills | Status: DC

## 2019-07-09 MED ORDER — ALBUTEROL SULFATE HFA 108 (90 BASE) MCG/ACT IN AERS: 2.0000 | INHALATION_SPRAY | Freq: Four times a day (QID) | RESPIRATORY_TRACT | 2 refills | Status: DC | PRN

## 2019-07-09 NOTE — Progress Notes (Signed)
Virtual Visit via Video Note  I connected with [name] on 07/09/19 at 11:20 AM EST by a video enabled telemedicine application and verified that I am speaking with the correct person using two identifiers.  Location: Patient: home with wife  Provider: home    I discussed the limitations of evaluation and management by telemedicine and the availability of in person appointments. The patient expressed understanding and agreed to proceed.  History of Present Illness: Pt is home with is wife c/o cough and pulse ox dropping to 92%  But then will go up to 97%    Cough is dry No fevers He had covid in January and still has not gotten his smell or taste back    Past Medical History:  Diagnosis Date  . Atrial flutter ([place])    s/p ablation  . CAD (coronary artery disease)    cath 06/2007 40% LM  . CVD (cerebrovascular disease)    59% right ICA, 49% left ICA; h/oTIA; fu study 8/11 with normal carotids  . Dysrhythmia   . Gait abnormality   . Heart murmur   . History of bronchitis   . History of colonoscopy   . Horseshoe kidney   . Hyperlipidemia   . MR (mitral regurgitation)    severe; mitral valve repair in march 2009  . MVP (mitral valve prolapse)   . Osteoarthritis    both knees  . Shortness of breath dyspnea    increased exertion    Current Outpatient Medications on File Prior to Visit  Medication Sig Dispense Refill  . amoxicillin (AMOXIL) 500 MG capsule Take 2,000 mg by mouth See admin instructions. Take 2000 mg by mouth 1 hour dental procedure    . carvedilol (COREG) 12.5 MG tablet TAKE 1 TABLET (12.5 MG TOTAL) BY MOUTH 2 (TWO) TIMES DAILY. 180 tablet 2  . ELIQUIS 5 MG TABS tablet TAKE 1 TABLET BY MOUTH TWICE A DAY 180 tablet 1  . Evolocumab (REPATHA SURECLICK) 169 MG/ML SOAJ Inject 140 mg into the skin every 14 (fourteen) days. 2 pen 6  . fluticasone (FLONASE) 50 MCG/ACT nasal spray Place 2 sprays into both nostrils daily. 16 g 6  . levocetirizine (XYZAL) 5 MG tablet  Take 1 tablet (5 mg total) by mouth every evening. 30 tablet 5  . meloxicam (MOBIC) 15 MG tablet Take 1 tablet (15 mg total) by mouth daily as needed for pain. 30 tablet 2  . Menthol, Topical Analgesic, (ICY HOT EX) Apply 1 application topically daily as needed (muscle pain).    . Multiple Vitamins-Minerals (CENTRUM SILVER PO) Take 1 tablet by mouth 2 (two) times a week.    . sodium chloride (OCEAN) 0.65 % SOLN nasal spray Place 1 spray into both nostrils at bedtime as needed (nasal cleansing).    Marland Kitchen telmisartan (MICARDIS) 80 MG tablet Take 1 tablet (80 mg total) by mouth daily. 90 tablet 3   No current facility-administered medications on file prior to visit.   Allergies  Allergen Reactions  . Statins Other (See Comments)    elevated liver function   . Codeine Nausea Only  . Tape Rash   Social History   Socioeconomic History  . Marital status: Married    Spouse name: Not on file  . Number of children: Not on file  . Years of education: Not on file  . Highest education level: Not on file  Occupational History  . Occupation: [employer]: works from home  Tobacco Use  . Smoking status: Never Smoker  . Smokeless tobacco: Never Used  Substance and Sexual Activity  . Alcohol use: Yes    Alcohol/week: 0.0 standard drinks    Comment: occasional 1-2 wine drinks a month  . Drug use: No  . Sexual activity: Yes    Partners: Female  Other Topics Concern  . Not on file  Social History Narrative   Exercise--  Total gym   Social Determinants of Health   Financial Resource Strain:   . Difficulty of Paying Living Expenses: Not on file  Food Insecurity:   . Worried About Charity fundraiser in the Last Year: Not on file  . Ran Out of Food in the Last Year: Not on file  Transportation Needs:   . Lack of Transportation (Medical): Not on file  . Lack of Transportation (Non-Medical): Not on file  Physical Activity:   . Days of Exercise per Week: Not on file  . Minutes of  Exercise per Session: Not on file  Stress:   . Feeling of Stress : Not on file  Social Connections:   . Frequency of Communication with Friends and Family: Not on file  . Frequency of Social Gatherings with Friends and Family: Not on file  . Attends Religious Services: Not on file  . Active Member of Clubs or Organizations: Not on file  . Attends Archivist Meetings: Not on file  . Marital Status: Not on file  Intimate Partner Violence:   . Fear of Current or Ex-Partner: Not on file  . Emotionally Abused: Not on file  . Physically Abused: Not on file  . Sexually Abused: Not on file    Observations/Objective: 148/90  Temp 97.0   Pulse ox 92% ,95% repeat Pt is slightly sob with talking  Assessment and Plan: 1. Cough in adult  - DG Chest 2 View; Future  2. Bronchitis cxr Pt can be seen in resp clinic Monday-- if inc sob or pulse ox drops <95% and stays there he needs to go to ER  Pt understands-- he did not want to go now due to weather conditions but agreed to go if symptoms worsen  Albuterol and z pack prescribed  - azithromycin (ZITHROMAX Z-PAK) 250 MG tablet; As directed  Dispense: 6 each; Refill: 0 - albuterol (VENTOLIN HFA) 108 (90 Base) MCG/ACT inhaler; Inhale 2 puffs into the lungs every 6 (six) hours as needed for wheezing or shortness of breath.  Dispense: 18 g; Refill: 2   Follow Up Instructions:    I discussed the assessment and treatment plan with the patient. The patient was provided an opportunity to ask questions and all were answered. The patient agreed with the plan and demonstrated an understanding of the instructions.   The patient was advised to call back or seek an in-person evaluation if the symptoms worsen or if the condition fails to improve as anticipated.  I provided 30 minutes of non-face-to-face time during this encounter.   Ann Held, DO

## 2019-07-13 ENCOUNTER — Encounter: Payer: Self-pay | Admitting: Family Medicine

## 2019-07-13 ENCOUNTER — Ambulatory Visit (INDEPENDENT_AMBULATORY_CARE_PROVIDER_SITE_OTHER): Payer: 59 | Admitting: Family Medicine

## 2019-07-13 VITALS — BP 172/92 | HR 85 | Temp 98.5°F | Ht 70.0 in | Wt 258.2 lb

## 2019-07-13 DIAGNOSIS — K760 Fatty (change of) liver, not elsewhere classified: Secondary | ICD-10-CM

## 2019-07-13 DIAGNOSIS — R059 Cough, unspecified: Secondary | ICD-10-CM

## 2019-07-13 DIAGNOSIS — I517 Cardiomegaly: Secondary | ICD-10-CM | POA: Diagnosis not present

## 2019-07-13 DIAGNOSIS — R635 Abnormal weight gain: Secondary | ICD-10-CM | POA: Diagnosis not present

## 2019-07-13 DIAGNOSIS — R05 Cough: Secondary | ICD-10-CM

## 2019-07-13 DIAGNOSIS — R0602 Shortness of breath: Secondary | ICD-10-CM | POA: Diagnosis not present

## 2019-07-13 DIAGNOSIS — I10 Essential (primary) hypertension: Secondary | ICD-10-CM

## 2019-07-13 DIAGNOSIS — R748 Abnormal levels of other serum enzymes: Secondary | ICD-10-CM

## 2019-07-13 MED ORDER — DEBROX 6.5 % OT SOLN: 5.0000 [drp] | Freq: Two times a day (BID) | OTIC | 0 refills | Status: DC | PRN

## 2019-07-13 MED ORDER — FUROSEMIDE 40 MG PO TABS: 40.0000 mg | ORAL_TABLET | Freq: Every day | ORAL | 3 refills | Status: DC

## 2019-07-13 NOTE — Patient Instructions (Signed)
Follow-up with your cardiologist tomorrow for follow-up appointment.  I am treating you today for PICC line for blood which is suspect is related to what is likely symptoms of heart failure.  I would like you to follow-up with cardiology to determine the long-term plan.  Closely monitor your blood pressure as it has been high at home and currently has a day.  Start Lasix 40 mg once daily take in the morning to avoid nighttime increase urination.  If symptoms of shortness of breath or you gain greater than 3 pounds overnight go immediately to the ER or contact the cardiology office directly as this is a medical emergency.    Cardiomyopathy, Adult  Cardiomyopathy is a long-term (chronic) disease of the heart muscle. The disease makes the heart muscle thick, weak, or stiff. As a result, the heart works harder to pump blood. Over time, cardiomyopathy can lead to an irregular heartbeat (arrhythmia) and heart failure. There are several types of cardiomyopathy. The kind of cardiomyopathy that you have depends on what part of the heart is affected and how it is affected. What are the causes? This condition may be caused by:  A medical condition that damages the heart, such as diabetes, high blood pressure, or heart attack.  Diseases of the immune system, connective tissues, or endocrine system.  Alcohol abuse.  Using illegal drugs.  Taking certain medicines.  Pregnancy.  Too much iron in the body (hemochromatosis).  Cancer treatments.  Protein buildup in the organs (amyloidosis), or inflammation in the organs (sarcoidosis). In some cases, the cause is not known. What increases the risk? You are more likely to develop this condition if:  You have a gene that is passed down (inherited) from a family member.  You are overweight or obese. What are the signs or symptoms? Often, people with this condition have no symptoms. If you do have symptoms, this may include:  Shortness of breath,  especially during activity.  Fatigue.  An irregular heartbeat.  Feeling dizzy or light-headed.  Fainting.  Chest pain.  Coughing.  Swelling of the lower legs, feet, abdomen, or neck veins. How is this diagnosed? This condition is diagnosed based on:  Your symptoms and medical history.  A physical exam.  Blood tests and imaging studies, such as X-ray, echocardiogram, or MRI.  Other tests, such as: ? An electrocardiogram (ECG). ? A portable heart monitor that records your heart's electrical activity (event monitor). ? A stress test. ? Cardiac catheterization and coronary angiogram. ? Heart tissue biopsy. How is this treated? Your treatment depends on the type and severity of your symptoms. If you do not have symptoms, you may not need treatment. Treatment may include:  General healthy lifestyle changes.  Medicines to: ? Treat high blood pressure, abnormal heart rate, or inflammation. ? Clear excess fluids from your body. ? Prevent blood clots. ? Balance minerals (electrolytes) in your body and get rid of extra sodium in your body. ? Strengthen your heartbeat.  Surgery to: ? Repair a heart defect, remove heart tissue, or destroy tissues in the area of abnormal electrical activity (ablation). ? Implant a device to treat serious heart rhythm problems or to restore the heart's ability to pump blood. ? Replace your heart with a healthy heart. This is done if all other treatments have failed. Other treatments may include:  Cardiac resynchronization therapy (CRT). This restores the heart's normal beating. Follow these instructions at home: Lifestyle   Eat a heart-healthy diet that includes plenty of fruits, vegetables,  whole grains, and foods that are low in salt (sodium).  Maintain a healthy weight.  Stay physically active. Ask your health care provider what activities are safe for you.  Do not use any products that contain nicotine or tobacco, such as cigarettes,  e-cigarettes, and chewing tobacco. If you need help quitting, ask your health care provider.  Try to get at least 7 hours of sleep each night.  Find healthy ways to manage stress. Alcohol use  Do not drink alcohol if: ? Your health care provider tells you not to drink. ? You are pregnant, may be pregnant, or are planning to become pregnant. If you drink alcohol:  Limit how much you use to: ? 0-1 drink a day for women. ? 0-2 drinks a day for men.  Be aware of how much alcohol is in your drink. In the [place] one drink equals one 12 oz bottle of beer (355 mL), one 5 oz glass of wine (148 mL), or one 1 oz glass of hard liquor (44 mL). General instructions  Take over-the-counter and prescription medicines only as told by your health care provider. Some medicines can be dangerous for your heart.  If you are prescribed a blood thinner, make sure you understand what to do in a bleeding situation.  Tell all health care providers, including your dentist, that you have cardiomyopathy. Ask your health care provider if you need antibiotics before having dental care or before surgery.  Ask your health care provider if you should wear a medical identification bracelet. This may be important if you have a pacemaker or a defibrillator.  Get all needed vaccines. Get a flu shot every year.  Work closely with your health care provider to manage any chronic conditions.  If you plan to start a family, talk to a genetic counselor to discuss the risk of having a child with cardiomyopathy.  Keep all follow-up visits as told by your health care provider. This is important, even if you do not have any symptoms. Your health care provider may need to make sure your condition is not getting worse. Contact a health care provider if:  Your symptoms get worse.  You have new symptoms. Get help right away if you:  Have severe chest pain.  Have shortness of breath.  Cough up a pink, bubbly  substance.  Feel nauseous and you vomit.  Suddenly become light-headed or dizzy.  Feel your heart beating very quickly.  Feel like your heart is skipping beats. These symptoms may represent a serious problem that is an emergency. Do not wait to see if the symptoms will go away. Get medical help right away. Call your local emergency services (911 in the [place] Do not drive yourself to the hospital. Summary  Cardiomyopathy is a long-term (chronic) disease of the heart muscle.  Over time, cardiomyopathy can lead to an irregular heartbeat (arrhythmia) and heart failure.  A number of treatments are available for this condition. Your treatment will depend on the type and severity of your symptoms.  Follow your health care provider's instructions on diet, medicines, physical activity, and when to seek help. This information is not intended to replace advice given to you by your health care provider. Make sure you discuss any questions you have with your health care provider. Document Revised: 07/10/2018 Document Reviewed: 07/10/2018 Elsevier Patient Education  [place].

## 2019-07-13 NOTE — Progress Notes (Signed)
Patient ID: [name], male    DOB: [birth year], 69 y.o.   MRN: [id number]  PCP: Ann Held, DO  No chief complaint on file.   Subjective:  HPI [name] is a 69 y.o. male presents to [facility] for evaluation of shortness of breath, fatigue,  and unintentional weight gain following a recent diagnosis of COVID-19.  Diagnosed with COVID-19 in January. He has continued to experience worsening shortness of breath and had fluctuating increases of weight without any significant changes in oral intake of food. Of note patient has a history of dilated cardiomyopathy.  He has seen cardiology in the past with most recent echocardiogram 06/13/2018 which showed an EF 50 to 50% at that time his left atrium was severely dilated reflected a stable mitral valve repair.  Subsequently patient develop atrial fibrillation the latter part of last year and had to undergo an ablation which successfully converted him back to sinus rhythm January 21, 2019.  He denies any palpitations or sensations of abnormal heart rate.  He has noticed generalized edema mostly occurring in his abdomen.  Recent labs reflect elevated LFTs since COVID-19 infection however patient has a history of fatty liver disease.  He denies any jaundice or scleral changes.  He endorses shortness of breath with minimal exertional activities.  He feels that the shortness of breath has actually worsened since his original COVID-19 symptoms  Review of Systems Pertinent negatives listed in HPI  Patient Active Problem List   Diagnosis Date Noted  . Dilated cardiomyopathy ([place]) 06/29/2019  . Mass of joint of left shoulder 06/29/2019  . Atypical atrial flutter ([place]) 01/21/2019  . Chronic anticoagulation 04/04/2018  . Hypertension 10/17/2017  . Atrial flutter ([place])   . Need for shingles vaccine 01/18/2017  . Hyperlipidemia LDL goal <100 04/04/2016  . Atherosclerosis of native coronary artery of native heart without  angina pectoris 04/04/2016  . Postoperative anemia due to acute blood loss 03/19/2015  . Status post bilateral knee replacements 03/14/2015  . OA (osteoarthritis) of knee 03/09/2015  . Preventative health care 09/17/2014  . S/P mitral valve repair 03/18/2013  . Obesity (BMI 30-39.9) 02/24/2013  . HAND PAIN, RIGHT 06/05/2010  . Coronary atherosclerosis 12/28/2009  . CAROTID STENOSIS 12/28/2009  . CEREBROVASCULAR DISEASE 12/28/2009  . PRURITUS ANI 05/26/2009  . HYPERLIPIDEMIA, FAMILIAL 04/26/2009  . DEGENERATIVE JOINT DISEASE, KNEES, BILATERAL 09/27/2008  . Fatty liver disease, nonalcoholic 34/19/6222  . ABNORMAL TRANSAMINASE-LFT'S 12/29/2007  . History of colonic polyps 12/25/2007  . 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 FEVER, HX OF 12/22/2007  . MITRAL REGURGITATION, SEVERE 03/19/2007  . HEMOCCULT POSITIVE STOOL 02/03/2007  . SYSTOLIC MURMUR 97/98/9211      Prior to Admission medications   Medication Sig Start Date End Date Taking? Authorizing Provider  albuterol (VENTOLIN HFA) 108 (90 Base) MCG/ACT inhaler Inhale 2 puffs into the lungs every 6 (six) hours as needed for wheezing or shortness of breath. 07/09/19  Yes Roma Schanz R, DO  carvedilol (COREG) 12.5 MG tablet TAKE 1 TABLET (12.5 MG TOTAL) BY MOUTH 2 (TWO) TIMES DAILY. 04/22/19  Yes Camnitz, Will Hassell Done, MD  ELIQUIS 5 MG TABS tablet TAKE 1 TABLET BY MOUTH TWICE A DAY 03/31/19  Yes Camnitz, Will Hassell Done, MD  Evolocumab (REPATHA SURECLICK) 941 MG/ML SOAJ Inject 140 mg into the skin every 14 (fourteen) days. 05/07/19  Yes Lelon Perla, MD  fluticasone (FLONASE) 50 MCG/ACT nasal spray Place 2 sprays into both nostrils daily. 06/10/19  Yes  Carollee Herter, Yvonne R, DO  meloxicam (MOBIC) 15 MG tablet Take 1 tablet (15 mg total) by mouth daily as needed for pain. 06/10/19  Yes Ann Held, DO  Multiple Vitamins-Minerals (CENTRUM SILVER PO) Take 1 tablet by mouth 2 (two) times a week.   Yes [provider]   sodium chloride (OCEAN) 0.65 % SOLN nasal spray Place 1 spray into both nostrils at bedtime as needed (nasal cleansing).   Yes [provider]  telmisartan (MICARDIS) 80 MG tablet Take 1 tablet (80 mg total) by mouth daily. 04/21/19  Yes Lelon Perla, MD  amoxicillin (AMOXIL) 500 MG capsule Take 2,000 mg by mouth See admin instructions. Take 2000 mg by mouth 1 hour dental procedure 12/31/16   [provider]  azithromycin (ZITHROMAX Z-PAK) 250 MG tablet As directed Patient not taking: Reported on 07/13/2019 07/09/19   Carollee Herter, Alferd Apa, DO  levocetirizine (XYZAL) 5 MG tablet Take 1 tablet (5 mg total) by mouth every evening. Patient not taking: Reported on 07/13/2019 06/10/19   Carollee Herter, Alferd Apa, DO  Menthol, Topical Analgesic, (ICY HOT EX) Apply 1 application topically daily as needed (muscle pain).    [provider]    Past Medical, Surgical Family and Social History reviewed and updated.    Objective:   Today's Vitals   07/13/19 1818  BP: (!) 172/92  Pulse: 85  Temp: 98.5 F (36.9 C)  TempSrc: Oral  SpO2: 94%  Weight: 258 lb 4 oz (117.1 kg)  Height: 5' 10"  (1.778 m)    Wt Readings from Last 3 Encounters:  07/13/19 258 lb 4 oz (117.1 kg)  06/29/19 255 lb 6.4 oz (115.8 kg)  04/23/19 251 lb (113.9 kg)    Physical Exam Constitutional:      Appearance: He is obese. He is not ill-appearing.  HENT:     Head: Normocephalic.  Eyes:     Extraocular Movements: Extraocular movements intact.     Pupils: Pupils are equal, round, and reactive to light.  Cardiovascular:     Rate and Rhythm: Normal rate.     Pulses: Normal pulses.  Pulmonary:     Effort: No retractions.     Breath sounds: Decreased air movement present. No wheezing, rhonchi or rales.  Abdominal:     General: Abdomen is protuberant. Bowel sounds are normal. There is distension.     Palpations: Abdomen is rigid.     Tenderness: There is no abdominal tenderness. There is no right  CVA tenderness, left CVA tenderness or guarding.  Musculoskeletal:     Cervical back: Normal range of motion.     Right lower leg: No edema.     Left lower leg: No edema.  Skin:    General: Skin is warm and dry.  Neurological:     Mental Status: He is alert.  Psychiatric:        Mood and Affect: Mood normal.       Assessment & Plan:  1. Shortness of breath 2. Cardiomegaly 3. Cough 4. Unintended weight gain 5. Fatty liver disease, nonalcoholic 6. Elevated liver enzymes 7. Accelerated hypertension  Patient with a history of dilated cardiomyopathy, hypertension, morbid obesity, and recent COVID-19 infection in January presents today with a concern of worsening shortness of breath and unintentional weight gain.  Patient also has a history of fatty liver disease and has had persistent elevated liver enzymes since COVID-19 infection and has also had some previous elevation in the past.  EKG  today revealed normal sinus rhythm with a first-degree AV block with occasional PVCs which is similar to a previous EKG reading back in November.  Chest x-ray completed today was negative for pneumonia although revealed mild cardiomegaly which is consistent with prior diagnosis of dilated cardiomyopathy disease.  Patient has also had some unintended weight gain which could be related to underlying liver disease and or acute onset CHF.  Patient is already prescribed carvedilol.  We will add furosemide today and patient will follow-up with his cardiology to schedule a follow-up visit.  His last echocardiogram was completed January 2020 revealed an EF of 50 to 55% which is reassuring however given his symptoms and recent COVID-19 diagnosis he likely will warrant follow-up cardiac studies to ensure no changes have occurred.  Patient is in no acute distress and agrees to start furosemide and follow-up with cardiology and PCP.    -The patient was given clear instructions to go to ER or return to medical center if  symptoms do not improve, worsen or new problems develop. The patient verbalized understanding.     Molli Barrows, FNP-C [facility], PRN Provider  [facility]. [place], [place] Clinic Phone: [phone] Clinic Fax: [phone] Clinic Hours: 5:30 pm -7:30 pm (Monday-Friday)

## 2019-07-15 LAB — COMPREHENSIVE METABOLIC PANEL
ALT: 130 IU/L — ABNORMAL HIGH (ref 0–44)
AST: 58 IU/L — ABNORMAL HIGH (ref 0–40)
Albumin/Globulin Ratio: 1.8 (ref 1.2–2.2)
Albumin: 4.4 g/dL (ref 3.8–4.8)
Alkaline Phosphatase: 63 IU/L (ref 39–117)
BUN/Creatinine Ratio: 19 (ref 10–24)
BUN: 17 mg/dL (ref 8–27)
Bilirubin Total: 0.5 mg/dL (ref 0.0–1.2)
CO2: 22 mmol/L (ref 20–29)
Calcium: 9.6 mg/dL (ref 8.6–10.2)
Chloride: 104 mmol/L (ref 96–106)
Creatinine, Ser: 0.91 mg/dL (ref 0.76–1.27)
GFR calc Af Amer: 101 mL/min/{1.73_m2} (ref >59)
GFR calc non Af Amer: 88 mL/min/{1.73_m2} (ref >59)
Globulin, Total: 2.5 g/dL (ref 1.5–4.5)
Glucose: 106 mg/dL — ABNORMAL HIGH (ref 65–99)
Potassium: 4.3 mmol/L (ref 3.5–5.2)
Sodium: 141 mmol/L (ref 134–144)
Total Protein: 6.9 g/dL (ref 6.0–8.5)

## 2019-07-16 NOTE — Progress Notes (Signed)
See attached labs. LFT remains elevated. Patient being advised to follow-up with PCP for further evaluation and management of persistently elevated LFT.

## 2019-08-31 ENCOUNTER — Other Ambulatory Visit: Payer: Self-pay | Admitting: Family Medicine

## 2019-09-21 ENCOUNTER — Other Ambulatory Visit: Payer: Self-pay | Admitting: Cardiology

## 2019-09-21 NOTE — Telephone Encounter (Signed)
Eliquis 65m refill request received. Patient is 69years old, weight 117.1kg, Crea-0.91 on 07/13/2019, Diagnosis-Afib, and last seen by Dr. CCurt Bearson 04/23/2019. Dose is appropriate based on dosing criteria. Will send in refill to requested pharmacy.

## 2019-10-01 ENCOUNTER — Other Ambulatory Visit: Payer: Self-pay | Admitting: Family Medicine

## 2019-10-01 DIAGNOSIS — J40 Bronchitis, not specified as acute or chronic: Secondary | ICD-10-CM

## 2019-10-03 ENCOUNTER — Other Ambulatory Visit: Payer: Self-pay | Admitting: Family Medicine

## 2019-10-03 NOTE — Telephone Encounter (Signed)
Requested medication (s) are due for refill today: yes  Requested medication (s) are on the active medication list: yes  Last refill:  07/13/19  Future visit scheduled: no  Notes to clinic:  [facility] no longer refills for this practice   Requested Prescriptions  Pending Prescriptions Disp Refills   furosemide (LASIX) 40 MG tablet [Pharmacy Med Name: FUROSEMIDE 40 MG TABLET] 90 tablet 1    Sig: TAKE 1 TABLET BY MOUTH EVERY DAY      There is no refill protocol information for this order

## 2019-10-13 NOTE — Progress Notes (Signed)
HPI: FU mitral valve repair secondary to severe mitral regurgitation in February 2009. Note preoperative catheterization showed a 40% left main, 30% LAD and 30% right coronary artery. He has also had atrial flutter ablation in March 2009. Carotid dopplers in August of 2011 were normal. Patient has had elevated LFTs with statins in the past. Nuclear study 10/16 showed EF 49 with normal perfusion. TEE/DCCV of atrial flutterMay 2019; imagesshowed ejection fraction 35 to 40%, mildly dilated aortic root, mild mitral stenosis and mitral regurgitation; severe left atrial enlargement. Echocardiogram January 2020 showed ejection fraction 50 to 55%, prior mitral valve repair with no mitral regurgitation and severe left atrial enlargement.  Patient underwent left atypical atrial flutter and atrial fibrillation ablation September 2020.  Since I last saw him,the patient has dyspnea with more extreme activities but not with routine activities. It is relieved with rest. It is not associated with chest pain. There is no orthopnea, PND or pedal edema. There is no syncope or palpitations. There is no exertional chest pain.   Current Outpatient Medications  Medication Sig Dispense Refill  . albuterol (VENTOLIN HFA) 108 (90 Base) MCG/ACT inhaler TAKE 2 PUFFS BY MOUTH EVERY 6 HOURS AS NEEDED FOR WHEEZE OR SHORTNESS OF BREATH 90 g 2  . amoxicillin (AMOXIL) 500 MG capsule Take 2,000 mg by mouth See admin instructions. Take 2000 mg by mouth 1 hour dental procedure    . carvedilol (COREG) 12.5 MG tablet TAKE 1 TABLET (12.5 MG TOTAL) BY MOUTH 2 (TWO) TIMES DAILY. 180 tablet 2  . ELIQUIS 5 MG TABS tablet TAKE 1 TABLET BY MOUTH TWICE A DAY 180 tablet 1  . Evolocumab (REPATHA SURECLICK) 263 MG/ML SOAJ Inject 140 mg into the skin every 14 (fourteen) days. 2 pen 6  . fluticasone (FLONASE) 50 MCG/ACT nasal spray Place 2 sprays into both nostrils daily. 16 g 6  . meloxicam (MOBIC) 15 MG tablet TAKE 1 TABLET BY MOUTH EVERY  DAY AS NEEDED FOR PAIN 30 tablet 2  . Menthol, Topical Analgesic, (ICY HOT EX) Apply 1 application topically daily as needed (muscle pain).    . Multiple Vitamins-Minerals (CENTRUM SILVER PO) Take 1 tablet by mouth 2 (two) times a week.    . sodium chloride (OCEAN) 0.65 % SOLN nasal spray Place 1 spray into both nostrils at bedtime as needed (nasal cleansing).    Marland Kitchen telmisartan (MICARDIS) 80 MG tablet Take 1 tablet (80 mg total) by mouth daily. 90 tablet 3  . azithromycin (ZITHROMAX Z-PAK) 250 MG tablet As directed (Patient not taking: Reported on 07/13/2019) 6 each 0  . carbamide peroxide (DEBROX) 6.5 % OTIC solution Place 5 drops into both ears 2 (two) times daily as needed. 15 mL 0  . levocetirizine (XYZAL) 5 MG tablet Take 1 tablet (5 mg total) by mouth every evening. (Patient not taking: Reported on 07/13/2019) 30 tablet 5   No current facility-administered medications for this visit.     Past Medical History:  Diagnosis Date  . Atrial flutter ([place])    s/p ablation  . CAD (coronary artery disease)    cath 06/2007 40% LM  . CVD (cerebrovascular disease)    59% right ICA, 49% left ICA; h/oTIA; fu study 8/11 with normal carotids  . Dysrhythmia   . Gait abnormality   . Heart murmur   . History of bronchitis   . History of colonoscopy   . Horseshoe kidney   . Hyperlipidemia   . MR (mitral regurgitation)  severe; mitral valve repair in march 2009  . MVP (mitral valve prolapse)   . Osteoarthritis    both knees  . Shortness of breath dyspnea    increased exertion     Past Surgical History:  Procedure Laterality Date  . A-FLUTTER ABLATION N/A 01/29/2018   Procedure: A-FLUTTER ABLATION;  Surgeon: Constance Haw, MD;  Location: [facility];  Service: Cardiovascular;  Laterality: N/A;  . ATRIAL FIBRILLATION ABLATION N/A 01/21/2019   Procedure: ATRIAL FIBRILLATION ABLATION;  Surgeon: Constance Haw, MD;  Location: [facility];  Service: Cardiovascular;   Laterality: N/A;  . BACK SURGERY     secondary to ruptured disc  . CARDIAC CATHETERIZATION    . CARDIOVERSION N/A 09/23/2017   Procedure: CARDIOVERSION;  Surgeon: Lelon Perla, MD;  Location: [facility];  Service: Cardiovascular;  Laterality: N/A;  . heart ablation    . MITRAL VALVE REPAIR  06/2007  . TEE WITHOUT CARDIOVERSION N/A 09/23/2017   Procedure: TRANSESOPHAGEAL ECHOCARDIOGRAM (TEE);  Surgeon: Lelon Perla, MD;  Location: [facility];  Service: Cardiovascular;  Laterality: N/A;  . TONSILLECTOMY  1960  . TOTAL KNEE ARTHROPLASTY Bilateral 03/09/2015   Procedure: TOTAL KNEE BILATERAL;  Surgeon: Gaynelle Arabian, MD;  Location: [facility];  Service: Orthopedics;  Laterality: Bilateral;    Social History   Socioeconomic History  . Marital status: Married    Spouse name: Not on file  . Number of children: Not on file  . Years of education: Not on file  . Highest education level: Not on file  Occupational History  . Occupation: Audiological scientist    Comment: works from home  Tobacco Use  . Smoking status: Never Smoker  . Smokeless tobacco: Never Used  Substance and Sexual Activity  . Alcohol use: Yes    Alcohol/week: 0.0 standard drinks    Comment: occasional 1-2 wine drinks a month  . Drug use: No  . Sexual activity: Yes    Partners: Female  Other Topics Concern  . Not on file  Social History Narrative   Exercise--  Total gym   Social Determinants of Health   Financial Resource Strain:   . Difficulty of Paying Living Expenses:   Food Insecurity:   . Worried About Charity fundraiser in the Last Year:   . Arboriculturist in the Last Year:   Transportation Needs:   . Film/video editor (Medical):   Marland Kitchen Lack of Transportation (Non-Medical):   Physical Activity:   . Days of Exercise per Week:   . Minutes of Exercise per Session:   Stress:   . Feeling of Stress :   Social Connections:   . Frequency of Communication with Friends and Family:   . Frequency of Social  Gatherings with Friends and Family:   . Attends Religious Services:   . Active Member of Clubs or Organizations:   . Attends Archivist Meetings:   Marland Kitchen Marital Status:   Intimate Partner Violence:   . Fear of Current or Ex-Partner:   . Emotionally Abused:   Marland Kitchen Physically Abused:   . Sexually Abused:     Family History  Problem Relation Age of Onset  . Liver disease Father        cancer  . Hyperlipidemia Father   . Liver cancer Father   . Diabetes Other   . Colitis Daughter   . Crohn's disease Daughter   . Colon cancer Neg Hx   . Esophageal  cancer Neg Hx   . Rectal cancer Neg Hx   . Stomach cancer Neg Hx     ROS: back pain but no fevers or chills, productive cough, hemoptysis, dysphasia, odynophagia, melena, hematochezia, dysuria, hematuria, rash, seizure activity, orthopnea, PND, pedal edema, claudication. Remaining systems are negative.  Physical Exam: Well-developed obese in no acute distress.  Skin is warm and dry.  HEENT is normal.  Neck is supple.  Chest is clear to auscultation with normal expansion.  Cardiovascular exam is regular rate and rhythm.  Abdominal exam nontender or distended. No masses palpated. Extremities show no edema. neuro grossly intact  A/P  1 prior mitral valve repair-plan to continue SBE prophylaxis.  2 history of atypical left atrial flutter and atrial fibrillation status post ablation-patient remains in sinus rhythm today on electrocardiogram.  Continue beta-blocker and apixaban.  3 hypertension-blood pressure elevated; add amlodipine 5 mg daily and follow.  4 coronary artery disease-patient is not on aspirin given need for apixaban.  He is intolerant to statins.  5 history of cardiomyopathy-LV function has improved on most recent echocardiogram.  Continue beta-blocker and ARB.  Previous reduction in LV function felt possibly secondary to tachycardia.  6 hyperlipidemia-intolerant to statins.  Continue Repatha.  Check liver  functions.  Previously elevated felt secondary to fatty liver.  7 obesity-we again discussed the importance of diet, exercise and weight loss.  Kirk Ruths, MD

## 2019-10-20 ENCOUNTER — Encounter: Payer: Self-pay | Admitting: Cardiology

## 2019-10-20 ENCOUNTER — Ambulatory Visit: Payer: No Typology Code available for payment source | Admitting: Cardiology

## 2019-10-20 ENCOUNTER — Other Ambulatory Visit: Payer: Self-pay

## 2019-10-20 VITALS — BP 150/73 | HR 66 | Temp 93.7°F | Ht 70.0 in | Wt 256.6 lb

## 2019-10-20 DIAGNOSIS — E785 Hyperlipidemia, unspecified: Secondary | ICD-10-CM

## 2019-10-20 DIAGNOSIS — I4891 Unspecified atrial fibrillation: Secondary | ICD-10-CM | POA: Diagnosis not present

## 2019-10-20 DIAGNOSIS — I2510 Atherosclerotic heart disease of native coronary artery without angina pectoris: Secondary | ICD-10-CM

## 2019-10-20 DIAGNOSIS — I10 Essential (primary) hypertension: Secondary | ICD-10-CM

## 2019-10-20 LAB — COMPREHENSIVE METABOLIC PANEL
ALT: 96 IU/L — ABNORMAL HIGH (ref 0–44)
AST: 45 IU/L — ABNORMAL HIGH (ref 0–40)
Albumin/Globulin Ratio: 2.1 (ref 1.2–2.2)
Albumin: 4.7 g/dL (ref 3.8–4.8)
Alkaline Phosphatase: 57 IU/L (ref 48–121)
BUN/Creatinine Ratio: 19 (ref 10–24)
BUN: 19 mg/dL (ref 8–27)
Bilirubin Total: 0.5 mg/dL (ref 0.0–1.2)
CO2: 22 mmol/L (ref 20–29)
Calcium: 9.8 mg/dL (ref 8.6–10.2)
Chloride: 102 mmol/L (ref 96–106)
Creatinine, Ser: 1.01 mg/dL (ref 0.76–1.27)
GFR calc Af Amer: 89 mL/min/{1.73_m2} (ref >59)
GFR calc non Af Amer: 77 mL/min/{1.73_m2} (ref >59)
Globulin, Total: 2.2 g/dL (ref 1.5–4.5)
Glucose: 107 mg/dL — ABNORMAL HIGH (ref 65–99)
Potassium: 5 mmol/L (ref 3.5–5.2)
Sodium: 140 mmol/L (ref 134–144)
Total Protein: 6.9 g/dL (ref 6.0–8.5)

## 2019-10-20 MED ORDER — AMLODIPINE BESYLATE 5 MG PO TABS: 5.0000 mg | ORAL_TABLET | Freq: Every day | ORAL | 3 refills | Status: DC

## 2019-10-20 NOTE — Patient Instructions (Addendum)
Medication Instructions:   START AMLODIPINE 5 MG ONCE DAILY  *If you need a refill on your cardiac medications before your next appointment, please call your pharmacy*   Lab Work: If you have labs (blood work) drawn today and your tests are completely normal, you will receive your results only by: Marland Kitchen MyChart Message (if you have MyChart) OR . A paper copy in the mail If you have any lab test that is abnormal or we need to change your treatment, we will call you to review the results.  Follow-Up: At [facility], you and your health needs are our priority.  As part of our continuing mission to provide you with exceptional heart care, we have created designated Provider Care Teams.  These Care Teams include your primary Cardiologist (physician) and Advanced Practice Providers (APPs -  Physician Assistants and Nurse Practitioners) who all work together to provide you with the care you need, when you need it.  We recommend signing up for the patient portal called "MyChart".  Sign up information is provided on this After Visit Summary.  MyChart is used to connect with patients for Virtual Visits (Telemedicine).  Patients are able to view lab/test results, encounter notes, upcoming appointments, etc.  Non-urgent messages can be sent to your provider as well.   To learn more about what you can do with MyChart, go to [email or web address].    Your next appointment:   6 month(s)  The format for your next appointment:   Either In Person or Virtual  Provider:   You may see Kirk Ruths, MD or one of the following Advanced Practice Providers on your designated Care Team:    Kerin Ransom, PA-C  [place], [place]  Coletta Memos, [place]

## 2019-11-24 ENCOUNTER — Other Ambulatory Visit: Payer: Self-pay | Admitting: Family Medicine

## 2019-11-30 ENCOUNTER — Other Ambulatory Visit: Payer: Self-pay | Admitting: Family Medicine

## 2019-11-30 DIAGNOSIS — J069 Acute upper respiratory infection, unspecified: Secondary | ICD-10-CM

## 2019-12-05 ENCOUNTER — Other Ambulatory Visit: Payer: Self-pay | Admitting: Cardiology

## 2019-12-14 ENCOUNTER — Other Ambulatory Visit: Payer: Self-pay | Admitting: Family Medicine

## 2019-12-14 NOTE — Telephone Encounter (Signed)
Last office visit-07-09-2019 Last refill- 11-24-2019 Next office visit- not scheduled

## 2020-01-10 ENCOUNTER — Other Ambulatory Visit: Payer: Self-pay | Admitting: Cardiology

## 2020-03-11 ENCOUNTER — Other Ambulatory Visit: Payer: Self-pay | Admitting: Cardiology

## 2020-03-11 DIAGNOSIS — I484 Atypical atrial flutter: Secondary | ICD-10-CM

## 2020-03-11 DIAGNOSIS — I4891 Unspecified atrial fibrillation: Secondary | ICD-10-CM

## 2020-03-11 NOTE — Telephone Encounter (Signed)
Prescription refill request for Eliquis received. Indication: a flutter Last office visit: 10/20/19 Scr: 1.01  Age: 69 Weight: 116kg

## 2020-03-28 ENCOUNTER — Other Ambulatory Visit: Payer: Self-pay | Admitting: Family Medicine

## 2020-04-05 ENCOUNTER — Other Ambulatory Visit: Payer: Self-pay | Admitting: Cardiology

## 2020-04-06 ENCOUNTER — Other Ambulatory Visit: Payer: Self-pay | Admitting: Cardiology

## 2020-04-19 NOTE — Progress Notes (Signed)
HPI: FU mitral valve repair secondary to severe mitral regurgitation in February 2009. Note preoperative catheterization showed a 40% left main, 30% LAD and 30% right coronary artery. He has also had atrial flutter ablation in March 2009. Carotid dopplers in August of 2011 were normal. Patient has had elevated LFTs with statins in the past. Nuclear study 10/16 showed EF 49 with normal perfusion. TEE/DCCV of atrial flutterMay 2019; imagesshowed ejection fraction 35 to 40%, mildly dilated aortic root, mild mitral stenosis and mitral regurgitation; severe left atrial enlargement. Echocardiogram January 2020 showed ejection fraction 50 to 55%, prior mitral valve repair with no mitral regurgitation and severe left atrial enlargement. Patient underwent left atypical atrial flutter andatrial fibrillation ablation September 2020. Since I last saw him, he does have dyspnea on exertion particularly since having Covid last January.  There is no orthopnea, PND, pedal edema, chest pain or syncope.  Current Outpatient Medications  Medication Sig Dispense Refill   albuterol (VENTOLIN HFA) 108 (90 Base) MCG/ACT inhaler TAKE 2 PUFFS BY MOUTH EVERY 6 HOURS AS NEEDED FOR WHEEZE OR SHORTNESS OF BREATH 90 g 2   amoxicillin (AMOXIL) 500 MG capsule Take 2,000 mg by mouth See admin instructions. Take 2000 mg by mouth 1 hour dental procedure     carvedilol (COREG) 12.5 MG tablet TAKE 1 TABLET BY MOUTH TWICE A DAY 120 tablet 0   ELIQUIS 5 MG TABS tablet TAKE 1 TABLET BY MOUTH TWICE A DAY 180 tablet 1   fluticasone (FLONASE) 50 MCG/ACT nasal spray Place 2 sprays into both nostrils daily. 16 g 6   meloxicam (MOBIC) 15 MG tablet TAKE 1 TABLET BY MOUTH EVERY DAY AS NEEDED FOR PAIN 90 tablet 0   Menthol, Topical Analgesic, (ICY HOT EX) Apply 1 application topically daily as needed (muscle pain).     Multiple Vitamins-Minerals (CENTRUM SILVER PO) Take 1 tablet by mouth 2 (two) times a week.     REPATHA  SURECLICK 426 MG/ML SOAJ INJECT 140 MG INTO THE SKIN EVERY 14 (FOURTEEN) DAYS. 2 pen 11   sodium chloride (OCEAN) 0.65 % SOLN nasal spray Place 1 spray into both nostrils at bedtime as needed (nasal cleansing).     telmisartan (MICARDIS) 80 MG tablet TAKE 1 TABLET BY MOUTH EVERY DAY 90 tablet 3   amLODipine (NORVASC) 5 MG tablet Take 1 tablet (5 mg total) by mouth daily. 90 tablet 3   No current facility-administered medications for this visit.     Past Medical History:  Diagnosis Date   Atrial flutter ([place])    s/p ablation   CAD (coronary artery disease)    cath 06/2007 40% LM   CVD (cerebrovascular disease)    59% right ICA, 49% left ICA; h/oTIA; fu study 8/11 with normal carotids   Dysrhythmia    Gait abnormality    Heart murmur    History of bronchitis    History of colonoscopy    Horseshoe kidney    Hyperlipidemia    MR (mitral regurgitation)    severe; mitral valve repair in march 2009   MVP (mitral valve prolapse)    Osteoarthritis    both knees   Shortness of breath dyspnea    increased exertion     Past Surgical History:  Procedure Laterality Date   A-FLUTTER ABLATION N/A 01/29/2018   Procedure: A-FLUTTER ABLATION;  Surgeon: Constance Haw, MD;  Location: [place] [facility];  Service: Cardiovascular;  Laterality: N/A;   ATRIAL FIBRILLATION ABLATION N/A 01/21/2019  Procedure: ATRIAL FIBRILLATION ABLATION;  Surgeon: Constance Haw, MD;  Location: [place] [facility];  Service: Cardiovascular;  Laterality: N/A;   BACK SURGERY     secondary to ruptured disc   CARDIAC CATHETERIZATION     CARDIOVERSION N/A 09/23/2017   Procedure: CARDIOVERSION;  Surgeon: Lelon Perla, MD;  Location: [place];  Service: Cardiovascular;  Laterality: N/A;   heart ablation     MITRAL VALVE REPAIR  06/2007   TEE WITHOUT CARDIOVERSION N/A 09/23/2017   Procedure: TRANSESOPHAGEAL ECHOCARDIOGRAM (TEE);  Surgeon: Lelon Perla, MD;  Location: [facility]  ENDOSCOPY;  Service: Cardiovascular;  Laterality: N/A;   [place] Bilateral 03/09/2015   Procedure: TOTAL KNEE BILATERAL;  Surgeon: Gaynelle Arabian, MD;  Location: [facility];  Service: Orthopedics;  Laterality: Bilateral;    Social History   Socioeconomic History   Marital status: Married    Spouse name: Not on file   Number of children: Not on file   Years of education: Not on file   Highest education level: Not on file  Occupational History   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    Comment: works from home  Tobacco Use   Smoking status: Never Smoker   Smokeless tobacco: Never Used  Scientific laboratory technician Use: Never used  Substance and Sexual Activity   Alcohol use: Yes    Alcohol/week: 0.0 standard drinks    Comment: occasional 1-2 wine drinks a month   Drug use: No   Sexual activity: Yes    Partners: Female  Other Topics Concern   Not on file  Social History Narrative   Exercise--  Total gym   Social Determinants of Health   Financial Resource Strain:    Difficulty of Paying Living Expenses: Not on file  Food Insecurity:    Worried About [place] in the Last Year: Not on file   [employer] of Food in the Last Year: Not on file  Transportation Needs:    Lack of Transportation (Medical): Not on file   Lack of Transportation (Non-Medical): Not on file  Physical Activity:    Days of Exercise per Week: Not on file   Minutes of Exercise per Session: Not on file  Stress:    Feeling of Stress : Not on file  Social Connections:    Frequency of Communication with Friends and Family: Not on file   Frequency of Social Gatherings with Friends and Family: Not on file   Attends Religious Services: Not on file   Active Member of Clubs or Organizations: Not on file   Attends Archivist Meetings: Not on file   Marital Status: Not on file  Intimate Partner Violence:    Fear of Current or Ex-Partner: Not on file    Emotionally Abused: Not on file   Physically Abused: Not on file   Sexually Abused: Not on file    Family History  Problem Relation Age of Onset   Liver disease Father        cancer   Hyperlipidemia Father    Liver cancer Father    Diabetes Other    Colitis Daughter    Crohn's disease Daughter    Colon cancer Neg Hx    Esophageal cancer Neg Hx    Rectal cancer Neg Hx    Stomach cancer Neg Hx     ROS: no fevers or chills, productive cough, hemoptysis, dysphasia, odynophagia, melena, hematochezia, dysuria, hematuria, rash, seizure  activity, orthopnea, PND, pedal edema, claudication. Remaining systems are negative.  Physical Exam: Well-developed obese in no acute distress.  Skin is warm and dry.  HEENT is normal.  Neck is supple.  Chest is clear to auscultation with normal expansion.  Cardiovascular exam is regular rate and rhythm.  Abdominal exam nontender or distended. No masses palpated. Extremities show no edema. neuro grossly intact  ECG-sinus tachycardia, first-degree AV block, RV conduction delay, nonspecific ST changes.  Personally reviewed  A/P  1 status post mitral valve repair-continue SBE prophylaxis.  Patient describes increased dyspnea on exertion.  He is not volume overloaded on examination.  Question contribution from deconditioning.  Repeat echocardiogram to reassess mitral valve repair.  2 history of atypical atrial flutter and atrial fibrillation status post ablation-patient remains in sinus rhythm.  We will continue beta-blocker and apixaban at present dose.  Check hemoglobin and renal function.  3 coronary artery disease-no aspirin given need for apixaban.  Intolerant to statins.  4 hypertension-blood pressure controlled.  Continue present medical regimen and follow.  5 history of cardiomyopathy-LV function improved on most recent echocardiogram.  Previous reduction felt possibly secondary to tachycardia.  We will continue beta-blocker  and ARB at present dose.  6 hyperlipidemia-intolerant to statins.  Continue Repatha.  Check lipids and liver.  7 obesity-we discussed the importance of diet, exercise and weight loss.  Kirk Ruths, MD

## 2020-04-21 ENCOUNTER — Encounter: Payer: Self-pay | Admitting: Cardiology

## 2020-04-21 ENCOUNTER — Other Ambulatory Visit: Payer: Self-pay

## 2020-04-21 ENCOUNTER — Ambulatory Visit (INDEPENDENT_AMBULATORY_CARE_PROVIDER_SITE_OTHER): Payer: No Typology Code available for payment source | Admitting: Cardiology

## 2020-04-21 VITALS — BP 118/82 | HR 103 | Ht 70.0 in | Wt 258.0 lb

## 2020-04-21 DIAGNOSIS — Z9889 Other specified postprocedural states: Secondary | ICD-10-CM | POA: Diagnosis not present

## 2020-04-21 DIAGNOSIS — I4891 Unspecified atrial fibrillation: Secondary | ICD-10-CM | POA: Diagnosis not present

## 2020-04-21 DIAGNOSIS — I10 Essential (primary) hypertension: Secondary | ICD-10-CM

## 2020-04-21 DIAGNOSIS — I2510 Atherosclerotic heart disease of native coronary artery without angina pectoris: Secondary | ICD-10-CM | POA: Diagnosis not present

## 2020-04-21 DIAGNOSIS — E785 Hyperlipidemia, unspecified: Secondary | ICD-10-CM

## 2020-04-21 LAB — COMPREHENSIVE METABOLIC PANEL
ALT: 123 IU/L — ABNORMAL HIGH (ref 0–44)
AST: 62 IU/L — ABNORMAL HIGH (ref 0–40)
Albumin/Globulin Ratio: 2.3 — ABNORMAL HIGH (ref 1.2–2.2)
Albumin: 4.6 g/dL (ref 3.8–4.8)
Alkaline Phosphatase: 61 IU/L (ref 44–121)
BUN/Creatinine Ratio: 12 (ref 10–24)
BUN: 13 mg/dL (ref 8–27)
Bilirubin Total: 1 mg/dL (ref 0.0–1.2)
CO2: 21 mmol/L (ref 20–29)
Calcium: 9.4 mg/dL (ref 8.6–10.2)
Chloride: 103 mmol/L (ref 96–106)
Creatinine, Ser: 1.08 mg/dL (ref 0.76–1.27)
GFR calc Af Amer: 82 mL/min/{1.73_m2} (ref >59)
GFR calc non Af Amer: 71 mL/min/{1.73_m2} (ref >59)
Globulin, Total: 2 g/dL (ref 1.5–4.5)
Glucose: 116 mg/dL — ABNORMAL HIGH (ref 65–99)
Potassium: 4.6 mmol/L (ref 3.5–5.2)
Sodium: 139 mmol/L (ref 134–144)
Total Protein: 6.6 g/dL (ref 6.0–8.5)

## 2020-04-21 LAB — LIPID PANEL
Chol/HDL Ratio: 3 ratio (ref 0.0–5.0)
Cholesterol, Total: 146 mg/dL (ref 100–199)
HDL: 49 mg/dL (ref >39)
LDL Chol Calc (NIH): 75 mg/dL (ref 0–99)
Triglycerides: 126 mg/dL (ref 0–149)
VLDL Cholesterol Cal: 22 mg/dL (ref 5–40)

## 2020-04-21 LAB — CBC
Hematocrit: 43.6 % (ref 37.5–51.0)
Hemoglobin: 15.2 g/dL (ref 13.0–17.7)
MCH: 31.8 pg (ref 26.6–33.0)
MCHC: 34.9 g/dL (ref 31.5–35.7)
MCV: 91 fL (ref 79–97)
Platelets: 182 10*3/uL (ref 150–450)
RBC: 4.78 x10E6/uL (ref 4.14–5.80)
RDW: 11.8 % (ref 11.6–15.4)
WBC: 5 10*3/uL (ref 3.4–10.8)

## 2020-04-21 NOTE — Patient Instructions (Signed)
  Lab Work:  Your physician recommends that you HAVE LAB WORK TODAY  If you have labs (blood work) drawn today and your tests are completely normal, you will receive your results only by: Marland Kitchen MyChart Message (if you have MyChart) OR . A paper copy in the mail If you have any lab test that is abnormal or we need to change your treatment, we will call you to review the results.   Testing/Procedures:  Your physician has requested that you have an echocardiogram. Echocardiography is a painless test that uses sound waves to create images of your heart. It provides your doctor with information about the size and shape of your heart and how well your heart's chambers and valves are working. This procedure takes approximately one hour. There are no restrictions for this procedure.[place]     Follow-Up: At [facility], you and your health needs are our priority.  As part of our continuing mission to provide you with exceptional heart care, we have created designated Provider Care Teams.  These Care Teams include your primary Cardiologist (physician) and Advanced Practice Providers (APPs -  Physician Assistants and Nurse Practitioners) who all work together to provide you with the care you need, when you need it.  We recommend signing up for the patient portal called "MyChart".  Sign up information is provided on this After Visit Summary.  MyChart is used to connect with patients for Virtual Visits (Telemedicine).  Patients are able to view lab/test results, encounter notes, upcoming appointments, etc.  Non-urgent messages can be sent to your provider as well.   To learn more about what you can do with MyChart, go to [email or web address].    Your next appointment:   12 month(s)  The format for your next appointment:   In Person  Provider:   Kirk Ruths, MD

## 2020-04-28 ENCOUNTER — Other Ambulatory Visit: Payer: Self-pay | Admitting: Cardiology

## 2020-04-28 ENCOUNTER — Other Ambulatory Visit: Payer: Self-pay | Admitting: Family Medicine

## 2020-05-17 ENCOUNTER — Ambulatory Visit (HOSPITAL_COMMUNITY): Payer: No Typology Code available for payment source | Attending: Cardiology

## 2020-05-17 ENCOUNTER — Other Ambulatory Visit: Payer: Self-pay

## 2020-05-17 DIAGNOSIS — Z9889 Other specified postprocedural states: Secondary | ICD-10-CM | POA: Diagnosis not present

## 2020-05-17 DIAGNOSIS — Z952 Presence of prosthetic heart valve: Secondary | ICD-10-CM

## 2020-05-17 LAB — ECHOCARDIOGRAM COMPLETE
Area-P 1/2: 3.53 cm2
S' Lateral: 3.2 cm

## 2020-05-28 ENCOUNTER — Other Ambulatory Visit: Payer: Self-pay | Admitting: Cardiology

## 2020-05-31 ENCOUNTER — Other Ambulatory Visit: Payer: Self-pay | Admitting: Cardiology

## 2020-06-01 ENCOUNTER — Encounter: Payer: Self-pay | Admitting: *Deleted

## 2020-06-01 ENCOUNTER — Telehealth: Payer: Self-pay

## 2020-06-01 NOTE — Telephone Encounter (Signed)
Called pt to let them know that I sent pa by phone for expedited repatha and they should have a determination in 72 hours. Pt voiced gratitude and understanding

## 2020-06-01 NOTE — Telephone Encounter (Signed)
This encounter was created in error - please disregard.

## 2020-06-28 ENCOUNTER — Other Ambulatory Visit: Payer: Self-pay | Admitting: Family Medicine

## 2020-06-28 DIAGNOSIS — J40 Bronchitis, not specified as acute or chronic: Secondary | ICD-10-CM

## 2020-07-22 ENCOUNTER — Encounter: Payer: Self-pay | Admitting: *Deleted

## 2020-07-24 ENCOUNTER — Other Ambulatory Visit: Payer: Self-pay | Admitting: Family Medicine

## 2020-07-28 ENCOUNTER — Other Ambulatory Visit: Payer: Self-pay | Admitting: Family Medicine

## 2020-07-28 DIAGNOSIS — J069 Acute upper respiratory infection, unspecified: Secondary | ICD-10-CM

## 2020-07-28 NOTE — Telephone Encounter (Signed)
Pt has not been seen recently, okay to refuse?

## 2020-08-08 IMAGING — DX DG SHOULDER 2+V*L*
3 series · 3 of 3 positions shown · non-contrast
Comparison: Chest CT 11/24/2007.

CLINICAL DATA: Palpable knot superior to the left shoulder. No
known injury.

EXAM:
LEFT SHOULDER - 2+ VIEW

[shoulder grashey]
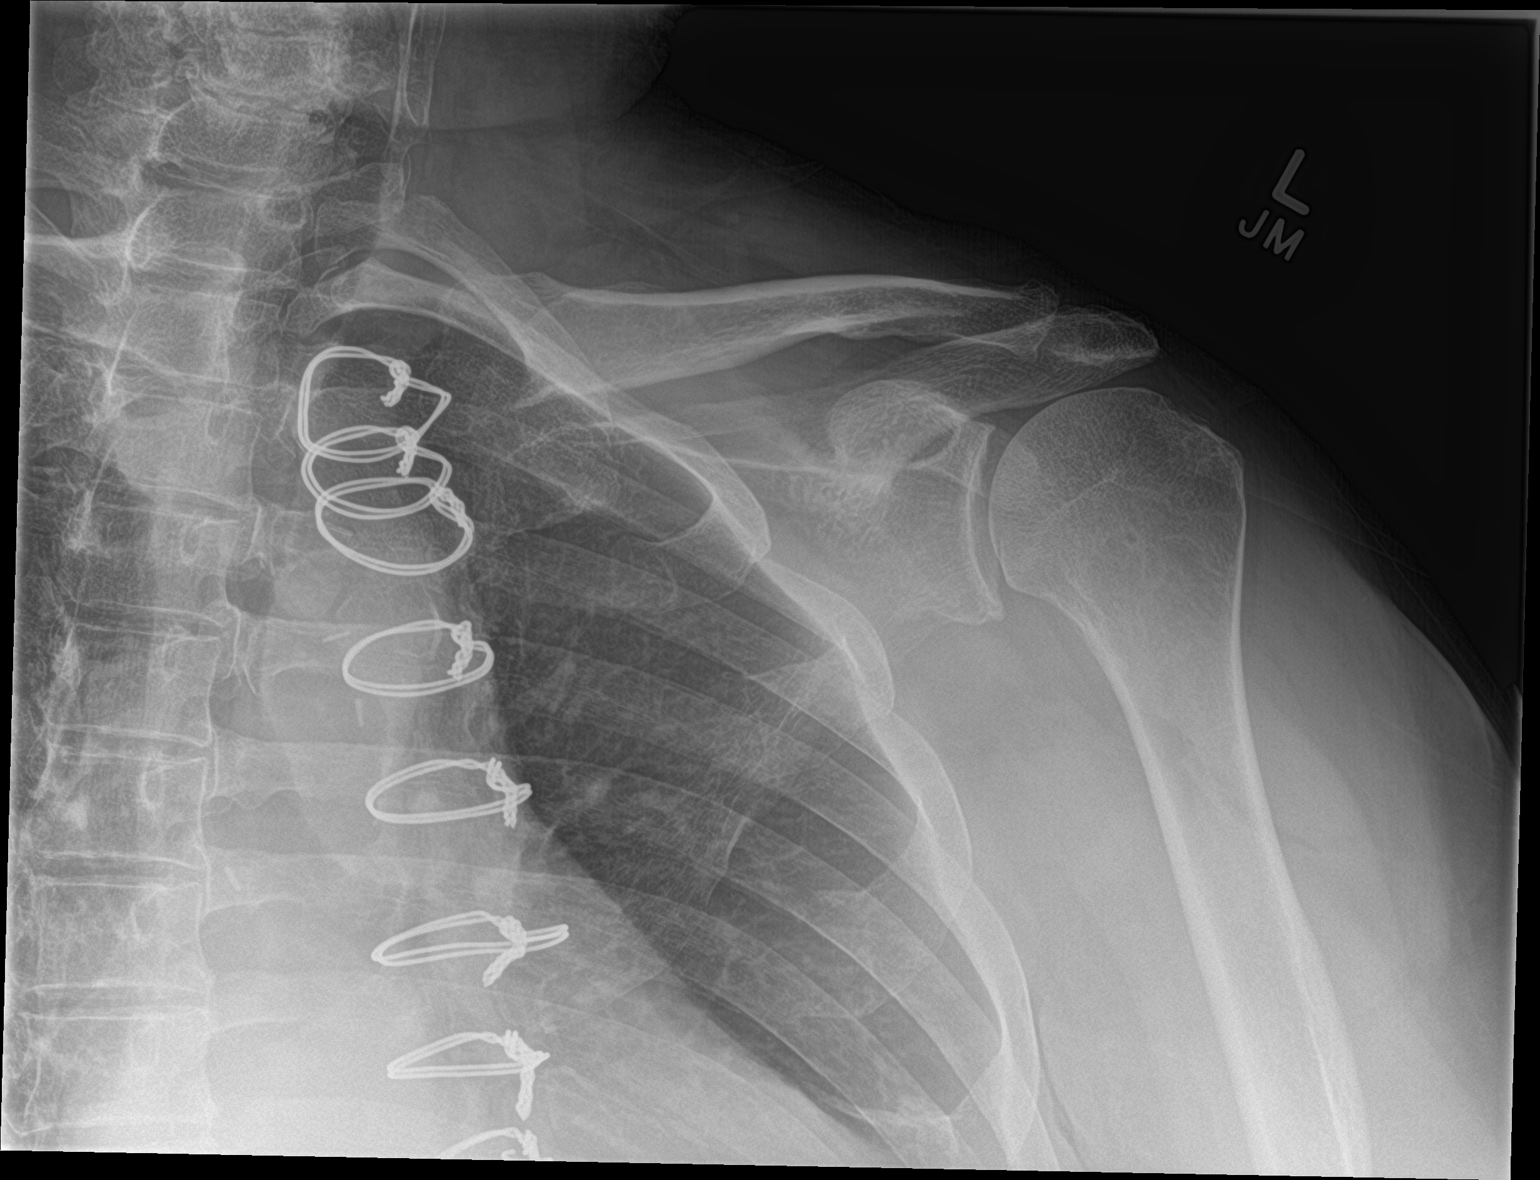

[shoulder y view]
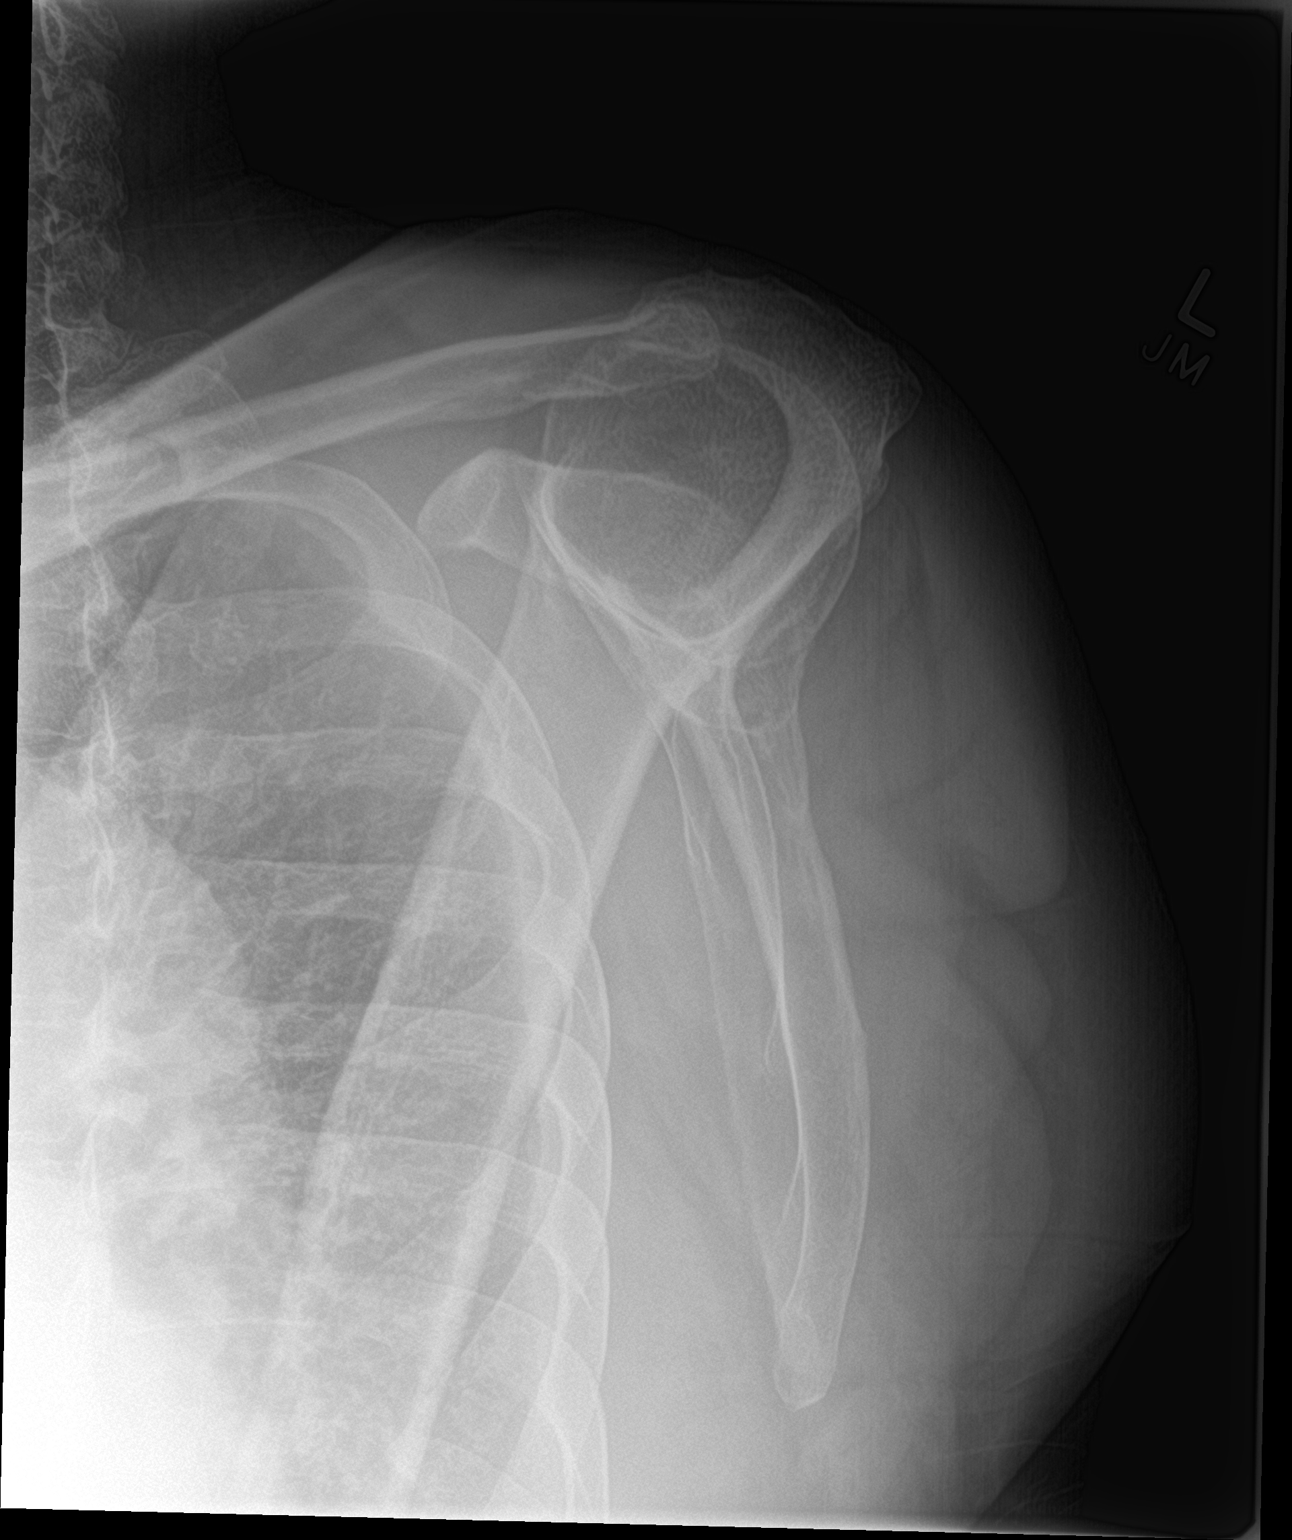

[shoulder axillary]
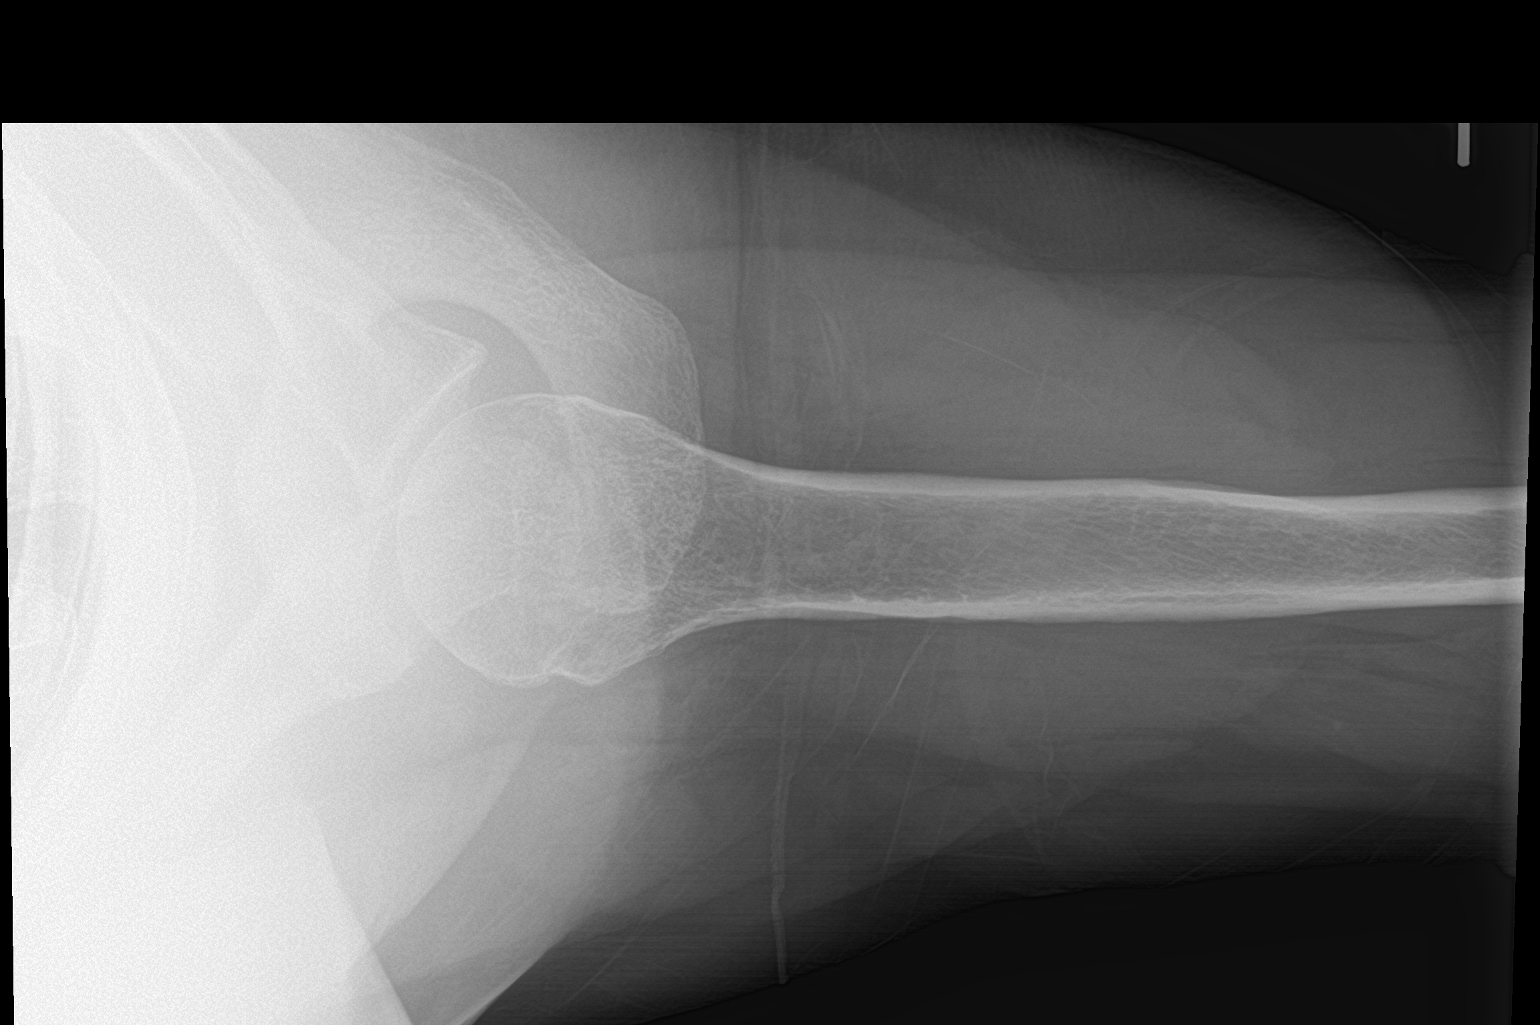

[3 of 3 positions shown; findings below may reference images not displayed]

FINDINGS: The mineralization and alignment are normal. There is no evidence of
acute fracture or dislocation. There are mild acromioclavicular and
glenohumeral degenerative changes. The subacromial space does not
appear significantly narrowed. Patient is status post median
sternotomy.
IMPRESSION: No acute findings. Mild degenerative changes.

## 2020-09-28 ENCOUNTER — Other Ambulatory Visit: Payer: Self-pay | Admitting: Cardiology

## 2020-09-28 DIAGNOSIS — I4891 Unspecified atrial fibrillation: Secondary | ICD-10-CM

## 2020-09-28 DIAGNOSIS — I484 Atypical atrial flutter: Secondary | ICD-10-CM

## 2020-09-28 NOTE — Telephone Encounter (Signed)
12m 117kg, scr 1.08 04/21/20, lovw/crenshaw 04/21/20

## 2020-09-29 ENCOUNTER — Other Ambulatory Visit: Payer: Self-pay | Admitting: Cardiology

## 2020-09-29 DIAGNOSIS — I10 Essential (primary) hypertension: Secondary | ICD-10-CM

## 2020-10-11 ENCOUNTER — Other Ambulatory Visit: Payer: Self-pay | Admitting: Cardiology

## 2020-11-18 NOTE — Telephone Encounter (Signed)
Patient made aware over the phone that Eliquis was approved. He was very appreciative of the help

## 2021-02-17 ENCOUNTER — Other Ambulatory Visit: Payer: Self-pay | Admitting: Cardiology

## 2021-03-21 ENCOUNTER — Ambulatory Visit: Payer: No Typology Code available for payment source | Admitting: Cardiology

## 2021-03-21 ENCOUNTER — Encounter: Payer: Self-pay | Admitting: Cardiology

## 2021-03-21 ENCOUNTER — Other Ambulatory Visit: Payer: Self-pay

## 2021-03-21 VITALS — BP 128/80 | HR 101 | Ht 70.0 in | Wt 257.0 lb

## 2021-03-21 DIAGNOSIS — I484 Atypical atrial flutter: Secondary | ICD-10-CM | POA: Diagnosis not present

## 2021-03-21 NOTE — Patient Instructions (Signed)
Medication Instructions:  Your physician recommends that you continue on your current medications as directed. Please refer to the Current Medication list given to you today.  *If you need a refill on your cardiac medications before your next appointment, please call your pharmacy*   Lab Work: None ordered   Testing/Procedures: None ordered   Follow-Up: At [facility], you and your health needs are our priority.  As part of our continuing mission to provide you with exceptional heart care, we have created designated Provider Care Teams.  These Care Teams include your primary Cardiologist (physician) and Advanced Practice Providers (APPs -  Physician Assistants and Nurse Practitioners) who all work together to provide you with the care you need, when you need it.  Your next appointment:   To be  determined   The format for your next appointment:   In Person  Provider:   Allegra Lai, MD    Thank you for choosing [facility]!!   Trinidad Curet, RN [phone]   Other Instructions  Medical Weight Loss & Management   [phone]   Dronedarone tablets What is this medication? DRONEDARONE (droe NE da rone) is an antiarrhythmic drug. It helps make your heart beat regularly. This medicine may be used for other purposes; ask your health care provider or pharmacist if you have questions. COMMON BRAND NAME(S): Multaq What should I tell my care team before I take this medication? They need to know if you have any of these conditions: heart failure history of irregular heartbeat liver disease liver or lung problems with the past use of amiodarone low levels of magnesium in the blood low levels of potassium in the blood other heart disease an unusual or allergic reaction to dronedarone, other medicines, foods, dyes, or preservatives pregnant or trying to get pregnant breast-feeding How should I use this medication? Take this medicine by mouth with a glass of water.  Follow the directions on the prescription label. Take one tablet with the morning meal and one tablet with the evening meal. Do not take your medicine more often than directed. Do not stop taking except on the advice of your doctor or health care professional. A special MedGuide will be given to you by the pharmacist with each prescription and refill. Be sure to read this information carefully each time. Talk to your pediatrician regarding the use of this medicine in children. Special care may be needed. Overdosage: If you think you have taken too much of this medicine contact a poison control center or emergency room at once. NOTE: This medicine is only for you. Do not share this medicine with others. What if I miss a dose? If you miss a dose, take it as soon as you can. If it is almost time for your next dose, take only that dose. Do not take double or extra doses. What may interact with this medication? Do not take this medicine with any of the following medications: arsenic trioxide certain antibiotics like clarithromycin, erythromycin, pentamidine, telithromycin, troleandomycin certain medicines for depression like tricyclic antidepressants certain medicines for fungal infections like fluconazole, itraconazole, ketoconazole, posaconazole, voriconazole certain medicines for irregular heart beat like amiodarone, disopyramide, flecainide, ibutilide, quinidine, propafenone, sotalol certain medicines for malaria like chloroquine, halofantrine cisapride cyclosporine droperidol haloperidol methadone other medicines that prolong the QT interval (cause an abnormal heart rhythm) like degarelix, encorafenib, entrectinib, eribulin, goserelin, lapatinib pimozide nefazodone phenothiazines like chlorpromazine, mesoridazine, prochlorperazine, thioridazine ritonavir ziprasidone This medicine may also interact with the following medications: certain medicines for  blood pressure, heart disease, or  irregular heart beat like diltiazem, metoprolol, propranolol, verapamil certain medicines for cholesterol like atorvastatin, lovastatin, simvastatin certain medicines for seizures like carbamazepine, phenobarbital, phenytoin digoxin dofetilide grapefruit juice rifampin sirolimus St. Author's Wort tacrolimus This list may not describe all possible interactions. Give your health care provider a list of all the medicines, herbs, non-prescription drugs, or dietary supplements you use. Also tell them if you smoke, drink alcohol, or use illegal drugs. Some items may interact with your medicine. What should I watch for while using this medication? Your condition will be monitored closely when you first begin therapy. Often, this drug is first started in a hospital or other monitored health care setting. Once you are on maintenance therapy, visit your doctor or health care professional for regular checks on your progress. Because your condition and use of this medicine carry some risk, it is a good idea to carry an identification card, necklace or bracelet with details of your condition, medications, and doctor or health care professional. Dennis Bast may get drowsy or dizzy. Do not drive, use machinery, or do anything that needs mental alertness until you know how this medicine affects you. Do not stand or sit up quickly, especially if you are an older patient. This reduces the risk of dizzy or fainting spells. What side effects may I notice from receiving this medication? Side effects that you should report to your doctor or health care professional as soon as possible: allergic reactions like skin rash, itching or hives, swelling of the face, lips, or tongue breathing problems cough dark urine fast, irregular heartbeat general ill feeling or flu-like symptoms light-colored stools loss of appetite, nausea right upper belly pain slow heartbeat stomach pain swelling of the legs or ankles unusually weak or  tired weight gain yellowing of the eyes or skin Side effects that usually do not require medical attention (report to your doctor or health care professional if they continue or are bothersome): nausea vomiting This list may not describe all possible side effects. Call your doctor for medical advice about side effects. You may report side effects to FDA at 1-800-FDA-1088. Where should I keep my medication? Keep out of the reach of children. Store at room temperature between 15 and 30 degrees C (59 and 86 degrees F). Throw away any unused medicine after the expiration date. NOTE: This sheet is a summary. It may not cover all possible information. If you have questions about this medicine, talk to your doctor, pharmacist, or health care provider.  2022 Elsevier/Gold Standard (2019-04-08 10:12:41)

## 2021-03-21 NOTE — Progress Notes (Signed)
Electrophysiology Office Note   Date:  03/21/2021   ID:  KOHL POLINSKY, DOB [birth year], MRN [id number]  PCP:  Carollee Herter, Alferd Apa, DO  Cardiologist:  Stanford Breed Primary Electrophysiologist:  Tallyn Holroyd Meredith Leeds, MD    No chief complaint on file.    History of Present Illness: [name] is a 70 y.o. male who is being seen today for the evaluation of atrial flutter at the request of Fabian Sharp. Presenting today for electrophysiology evaluation.    He has a history significant coronary artery disease, atrial flutter status post ablation, mitral regurgitation status post repair in 2009.  He had his atrial flutter ablation in 2009.  He was seen April 2019 in atrial flutter with heart rates of 126.  He was referred for cardioversion.  He had an EP study 01/29/2018 that showed a left atrial flutter.  He was cardioverted at that time.  He is now status post ablation of atypical atrial flutter and atrial fibrillation 01/21/2019.  Today, denies symptoms of palpitations, chest pain, shortness of breath, orthopnea, PND, lower extremity edema, claudication, dizziness, presyncope, syncope, bleeding, or neurologic sequela. The patient is tolerating medications without difficulties.  He has been having issues with jitteriness and some mild fatigue over the past few months.  He is also been mildly short of breath.  Unfortunately his ECG shows atrial flutter with heart rate of 101.  He was unaware that he was back out of rhythm.  Most the time he feels well without complaint.   Past Medical History:  Diagnosis Date   Atrial flutter ([place])    s/p ablation   CAD (coronary artery disease)    cath 06/2007 40% LM   CVD (cerebrovascular disease)    59% right ICA, 49% left ICA; h/oTIA; fu study 8/11 with normal carotids   Dysrhythmia    Gait abnormality    Heart murmur    History of bronchitis    History of colonoscopy    Horseshoe kidney    Hyperlipidemia    MR (mitral regurgitation)    severe;  mitral valve repair in march 2009   MVP (mitral valve prolapse)    Osteoarthritis    both knees   Shortness of breath dyspnea    increased exertion    Past Surgical History:  Procedure Laterality Date   A-FLUTTER ABLATION N/A 01/29/2018   Procedure: A-FLUTTER ABLATION;  Surgeon: Constance Haw, MD;  Location: [facility];  Service: Cardiovascular;  Laterality: N/A;   ATRIAL FIBRILLATION ABLATION N/A 01/21/2019   Procedure: ATRIAL FIBRILLATION ABLATION;  Surgeon: Constance Haw, MD;  Location: [facility];  Service: Cardiovascular;  Laterality: N/A;   BACK SURGERY     secondary to ruptured disc   CARDIAC CATHETERIZATION     CARDIOVERSION N/A 09/23/2017   Procedure: CARDIOVERSION;  Surgeon: Lelon Perla, MD;  Location: [facility];  Service: Cardiovascular;  Laterality: N/A;   heart ablation     MITRAL VALVE REPAIR  06/2007   TEE WITHOUT CARDIOVERSION N/A 09/23/2017   Procedure: TRANSESOPHAGEAL ECHOCARDIOGRAM (TEE);  Surgeon: Lelon Perla, MD;  Location: [facility];  Service: Cardiovascular;  Laterality: N/A;   [place] Bilateral 03/09/2015   Procedure: TOTAL KNEE BILATERAL;  Surgeon: Gaynelle Arabian, MD;  Location: [facility];  Service: Orthopedics;  Laterality: Bilateral;     Current Outpatient Medications  Medication Sig Dispense Refill   amLODipine (NORVASC) 5 MG tablet TAKE 1 TABLET BY  MOUTH EVERY DAY 90 tablet 3   amoxicillin (AMOXIL) 500 MG capsule Take 2,000 mg by mouth See admin instructions. Take 2000 mg by mouth 1 hour dental procedure     carvedilol (COREG) 12.5 MG tablet TAKE 1 TABLET BY MOUTH TWICE A DAY 180 tablet 4   ELIQUIS 5 MG TABS tablet TAKE 1 TABLET BY MOUTH TWICE A DAY 180 tablet 1   meloxicam (MOBIC) 15 MG tablet TAKE 1 TABLET BY MOUTH EVERY DAY AS NEEDED FOR PAIN 90 tablet 0   Menthol, Topical Analgesic, (ICY HOT EX) Apply 1 application topically daily as needed (muscle pain).     Multiple  Vitamins-Minerals (CENTRUM SILVER PO) Take 1 tablet by mouth 2 (two) times a week.     REPATHA SURECLICK 846 MG/ML SOAJ INJECT 140 MG INTO THE SKIN EVERY 14 (FOURTEEN) DAYS. 6 mL 3   Saw Palmetto 500 MG CAPS      sodium chloride (OCEAN) 0.65 % SOLN nasal spray Place 1 spray into both nostrils at bedtime as needed (nasal cleansing).     telmisartan (MICARDIS) 80 MG tablet TAKE 1 TABLET BY MOUTH EVERY DAY 90 tablet 3   Vitamin E 180 MG (400 UNIT) CAPS      No current facility-administered medications for this visit.    Allergies:   Praluent [alirocumab], Statins, Codeine, and Tape   Social History:  The patient  reports that he has never smoked. He has never used smokeless tobacco. He reports current alcohol use. He reports that he does not use drugs.   Family History:  The patient's family history includes Colitis in his daughter; Crohn's disease in his daughter; Diabetes in an other family member; Hyperlipidemia in his father; Liver cancer in his father; Liver disease in his father.   ROS:  Please see the history of present illness.   Otherwise, review of systems is positive for none.   All other systems are reviewed and negative.   PHYSICAL EXAM: VS:  BP 128/80   Pulse (!) 101   Ht 5' 10"  (1.778 m)   Wt 257 lb (116.6 kg)   SpO2 98%   BMI 36.88 kg/m  , BMI Body mass index is 36.88 kg/m. GEN: Well nourished, well developed, in no acute distress  HEENT: normal  Neck: no JVD, carotid bruits, or masses Cardiac: Tachycardic, regular; no murmurs, rubs, or gallops,no edema  Respiratory:  clear to auscultation bilaterally, normal work of breathing GI: soft, nontender, nondistended, + BS MS: no deformity or atrophy  Skin: warm and dry Neuro:  Strength and sensation are intact Psych: euthymic mood, full affect  EKG:  EKG is ordered today. Personal review of the ekg ordered shows atrial flutter, rate 101   Recent Labs: 04/21/2020: ALT 123; BUN 13; Creatinine, Ser 1.08; Hemoglobin 15.2;  Platelets 182; Potassium 4.6; Sodium 139    Lipid Panel     Component Value Date/Time   CHOL 146 04/21/2020 0839   TRIG 126 04/21/2020 0839   HDL 49 04/21/2020 0839   CHOLHDL 3.0 04/21/2020 0839   CHOLHDL 3 06/29/2019 1046   VLDL 30.0 06/29/2019 1046   LDLCALC 75 04/21/2020 0839   LDLDIRECT 147.0 01/16/2016 0908     Wt Readings from Last 3 Encounters:  03/21/21 257 lb (116.6 kg)  04/21/20 258 lb (117 kg)  10/20/19 256 lb 9.6 oz (116.4 kg)      Other studies Reviewed: Additional studies/ records that were reviewed today include: TEE 06/13/18 Review of the above records today  demonstrates:  - Left ventricle: The cavity size was normal. Wall thickness was   normal. Systolic function was normal. The estimated ejection   fraction was in the range of 50% to 55%. The study is not   technically sufficient to allow evaluation of LV diastolic   function. - Mitral valve: Post MV repair ? neo chord no significant residual   MR. - Left atrium: The atrium was severely dilated. - Atrial septum: No defect or patent foramen ovale was identified.  ASSESSMENT AND PLAN:  1.  Atypical atrial flutter: Status post ablation 01/21/2019.  Atrial flutter terminated with ablation around the right upper pulmonary vein from the right upper pulmonary vein to the mitral valve.  He is unfortunately in atrial flutter today.  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  Maintenance of sinus rhythm Jazsmine Macari be vital.  Due to that, I have offered him either cardioversion and starting Multaq or ablation.  He Shavaughn Seidl think about this and call us back with an answer.  CHA2DS2-VASc of 3. Eliquis 5 mg twice daily  2.  Hypertension: Currently well controlled  3.  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  Coronary artery disease: Mild without chest pain  5.  Obesity: Body mass index is 36.88 kg/m.  Being referred to [facility] weight loss  cl[facility]  Current medicines are reviewed at length with the patient today.   The patient does not have concerns regarding his medicines.  The following changes were made today: none  Labs/ tests ordered today include:  Orders Placed This Encounter  Procedures   EKG 12-Lead      Disposition:   FU with Dianara Smullen 6 months  Signed, Norine Reddington Meredith Leeds, MD  03/21/2021 4:31 PM     [address] [place] [address] [phone] (office) [phone] (fax)

## 2021-03-26 ENCOUNTER — Other Ambulatory Visit: Payer: Self-pay | Admitting: Cardiology

## 2021-03-26 DIAGNOSIS — I484 Atypical atrial flutter: Secondary | ICD-10-CM

## 2021-03-26 DIAGNOSIS — I4891 Unspecified atrial fibrillation: Secondary | ICD-10-CM

## 2021-03-27 NOTE — Telephone Encounter (Signed)
Prescription refill request for Eliquis received. Indication:Aflutter Last office visit:11/22 Scr:1.0 Age: 70 Weight:116.6 kg  Prescription refilled

## 2021-04-07 NOTE — Telephone Encounter (Signed)
Aware ablation date still held for 05/30/2021 for him. Aware I will be in touch with instructions. Patient verbalized understanding and agreeable to plan.

## 2021-04-23 ENCOUNTER — Other Ambulatory Visit: Payer: Self-pay | Admitting: Family Medicine

## 2021-04-23 DIAGNOSIS — J069 Acute upper respiratory infection, unspecified: Secondary | ICD-10-CM

## 2021-05-02 ENCOUNTER — Telehealth: Payer: Self-pay | Admitting: *Deleted

## 2021-05-02 ENCOUNTER — Encounter (HOSPITAL_COMMUNITY): Payer: Self-pay

## 2021-05-02 DIAGNOSIS — I4819 Other persistent atrial fibrillation: Secondary | ICD-10-CM

## 2021-05-02 DIAGNOSIS — Z01812 Encounter for preprocedural laboratory examination: Secondary | ICD-10-CM

## 2021-05-02 NOTE — Telephone Encounter (Signed)
Spoke to pt about upcoming ablation. Reviewed instructions briefly and sent via mychart. Aware office will call to schedule pre procedure CT and post procedure follow up. Labs scheduled for 05/19/21. Patient verbalized understanding and agreeable to plan.

## 2021-05-19 ENCOUNTER — Other Ambulatory Visit: Payer: Self-pay

## 2021-05-19 ENCOUNTER — Other Ambulatory Visit: Payer: No Typology Code available for payment source | Admitting: *Deleted

## 2021-05-19 DIAGNOSIS — Z01812 Encounter for preprocedural laboratory examination: Secondary | ICD-10-CM

## 2021-05-19 DIAGNOSIS — I4819 Other persistent atrial fibrillation: Secondary | ICD-10-CM

## 2021-05-19 LAB — BASIC METABOLIC PANEL
BUN/Creatinine Ratio: 15 (ref 10–24)
BUN: 14 mg/dL (ref 8–27)
CO2: 23 mmol/L (ref 20–29)
Calcium: 9.4 mg/dL (ref 8.6–10.2)
Chloride: 102 mmol/L (ref 96–106)
Creatinine, Ser: 0.95 mg/dL (ref 0.76–1.27)
Glucose: 110 mg/dL — ABNORMAL HIGH (ref 70–99)
Potassium: 4.6 mmol/L (ref 3.5–5.2)
Sodium: 139 mmol/L (ref 134–144)
eGFR: 87 mL/min/{1.73_m2} (ref >59)

## 2021-05-19 LAB — CBC
Hematocrit: 45.8 % (ref 37.5–51.0)
Hemoglobin: 15.7 g/dL (ref 13.0–17.7)
MCH: 31.5 pg (ref 26.6–33.0)
MCHC: 34.3 g/dL (ref 31.5–35.7)
MCV: 92 fL (ref 79–97)
Platelets: 190 10*3/uL (ref 150–450)
RBC: 4.99 x10E6/uL (ref 4.14–5.80)
RDW: 11.9 % (ref 11.6–15.4)
WBC: 6 10*3/uL (ref 3.4–10.8)

## 2021-05-23 NOTE — Progress Notes (Signed)
HPI: FU mitral valve repair secondary to severe mitral regurgitation in February 2009. Note preoperative catheterization showed a 40% left main, 30% LAD and 30% right coronary artery. He has also had atrial flutter ablation in March 2009.  Carotid dopplers in August of 2011 were normal. Patient has had elevated LFTs with statins in the past. Nuclear study 10/16 showed EF 49 with normal perfusion. TEE/DCCV of atrial flutter May 2019; images showed ejection fraction 35 to 40%, mildly dilated aortic root, mild mitral stenosis and mitral regurgitation; severe left atrial enlargement.  Echocardiogram December 2021 showed EF 45-50, moderate LAE, s/p MV repair with mean gradient 4 mmHg and no MR, moderate aortic root dilatation (45 mm).  Patient recently found to have recurrent atrial flutter by Dr. Curt Bears November 2022.  Cardiac CTA January 2023 showed calcium score of 117 which is 73rd percentile and mild nonobstructive coronary disease: Normal caliber aorta.  Underwent repeat ablation May 30, 2021.  Since I last saw him, he has some dyspnea on exertion but no orthopnea, PND, pedal edema, chest pain, palpitations or syncope.  No bleeding.  Current Outpatient Medications  Medication Sig Dispense Refill   amLODipine (NORVASC) 5 MG tablet TAKE 1 TABLET BY MOUTH EVERY DAY 90 tablet 3   amoxicillin (AMOXIL) 500 MG capsule Take 2,000 mg by mouth See admin instructions. Take 2000 mg by mouth 1 hour dental procedure     carvedilol (COREG) 12.5 MG tablet TAKE 1 TABLET BY MOUTH TWICE A DAY 180 tablet 4   ELIQUIS 5 MG TABS tablet TAKE 1 TABLET BY MOUTH TWICE A DAY *NOT COVERED BY INSURANCE* 180 tablet 1   meloxicam (MOBIC) 15 MG tablet TAKE 1 TABLET BY MOUTH EVERY DAY AS NEEDED FOR PAIN 90 tablet 0   Menthol, Topical Analgesic, (ICY HOT EX) Apply 1 application topically daily as needed (muscle pain).     Multiple Vitamins-Minerals (CENTRUM SILVER PO) Take 1 tablet by mouth daily.     REPATHA SURECLICK 920  MG/ML SOAJ INJECT 140 MG INTO THE SKIN EVERY 14 (FOURTEEN) DAYS. 6 mL 3   Saw Palmetto 500 MG CAPS Take 500 mg by mouth every other day. 585 mg     sodium chloride (OCEAN) 0.65 % SOLN nasal spray Place 1 spray into both nostrils at bedtime as needed (nasal cleansing).     telmisartan (MICARDIS) 80 MG tablet TAKE 1 TABLET BY MOUTH EVERY DAY 90 tablet 3   Vitamin E 180 MG (400 UNIT) CAPS Take 400 Units by mouth daily.     No current facility-administered medications for this visit.     Past Medical History:  Diagnosis Date   Atrial flutter ([place])    s/p ablation   CAD (coronary artery disease)    cath 06/2007 40% LM   CVD (cerebrovascular disease)    59% right ICA, 49% left ICA; h/oTIA; fu study 8/11 with normal carotids   Dysrhythmia    Gait abnormality    Heart murmur    History of bronchitis    History of colonoscopy    Horseshoe kidney    Hyperlipidemia    MR (mitral regurgitation)    severe; mitral valve repair in march 2009   MVP (mitral valve prolapse)    Osteoarthritis    both knees   Shortness of breath dyspnea    increased exertion     Past Surgical History:  Procedure Laterality Date   A-FLUTTER ABLATION N/A 01/29/2018   Procedure: A-FLUTTER ABLATION;  Surgeon:  Constance Haw, MD;  Location: [place] [facility];  Service: Cardiovascular;  Laterality: N/A;   ATRIAL FIBRILLATION ABLATION N/A 01/21/2019   Procedure: ATRIAL FIBRILLATION ABLATION;  Surgeon: Constance Haw, MD;  Location: [place] [facility];  Service: Cardiovascular;  Laterality: N/A;   ATRIAL FIBRILLATION ABLATION N/A 05/30/2021   Procedure: ATRIAL FIBRILLATION ABLATION;  Surgeon: Constance Haw, MD;  Location: [place] [facility];  Service: Cardiovascular;  Laterality: N/A;   BACK SURGERY     secondary to ruptured disc   CARDIAC CATHETERIZATION     CARDIOVERSION N/A 09/23/2017   Procedure: CARDIOVERSION;  Surgeon: Lelon Perla, MD;  Location: [place];  Service: Cardiovascular;   Laterality: N/A;   heart ablation     MITRAL VALVE REPAIR  06/2007   TEE WITHOUT CARDIOVERSION N/A 09/23/2017   Procedure: TRANSESOPHAGEAL ECHOCARDIOGRAM (TEE);  Surgeon: Lelon Perla, MD;  Location: [facility];  Service: Cardiovascular;  Laterality: N/A;   [place] Bilateral 03/09/2015   Procedure: TOTAL KNEE BILATERAL;  Surgeon: Gaynelle Arabian, MD;  Location: [facility];  Service: Orthopedics;  Laterality: Bilateral;    Social History   Socioeconomic History   Marital status: Married    Spouse name: Not on file   Number of children: Not on file   Years of education: Not on file   Highest education level: Not on file  Occupational History   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    Comment: works from home  Tobacco Use   Smoking status: Never   Smokeless tobacco: Never  Vaping Use   Vaping Use: Never used  Substance and Sexual Activity   Alcohol use: Yes    Alcohol/week: 0.0 standard drinks    Comment: occasional 1-2 wine drinks a month   Drug use: No   Sexual activity: Yes    Partners: Female  Other Topics Concern   Not on file  Social History Narrative   Exercise--  Total gym   Social Determinants of Health   Financial Resource Strain: Not on file  Food Insecurity: Not on file  Transportation Needs: Not on file  Physical Activity: Not on file  Stress: Not on file  Social Connections: Not on file  Intimate Partner Violence: Not on file    Family History  Problem Relation Age of Onset   Liver disease Father        cancer   Hyperlipidemia Father    Liver cancer Father    Diabetes Other    Colitis Daughter    Crohn's disease Daughter    Colon cancer Neg Hx    Esophageal cancer Neg Hx    Rectal cancer Neg Hx    Stomach cancer Neg Hx     ROS: no fevers or chills, productive cough, hemoptysis, dysphasia, odynophagia, melena, hematochezia, dysuria, hematuria, rash, seizure activity, orthopnea, PND, pedal edema, claudication. Remaining  systems are negative.  Physical Exam: Well-developed well-nourished in no acute distress.  Skin is warm and dry.  HEENT is normal.  Neck is supple.  Chest is clear to auscultation with normal expansion.  Cardiovascular exam is regular rate and rhythm.  Abdominal exam nontender or distended. No masses palpated. Extremities show no edema. neuro grossly intact   A/P  1 s/p MV repair-continue SBE prophylaxis.  2 atrial flutter-status post repeat ablation January 10.  3 coronary artery disease-mild on previous catheterization.  He is not anticoagulated due to need for apixaban.  Intolerant to statins.  4  hyperlipidemia-patient is intolerant to statins.  Continue Repatha.  Check lipids and liver.  5 hypertension-blood pressure controlled.  Continue present medications.  6 cardiomyopathy-LV function mildly reduced on most recent echocardiogram.  Continue ARB and beta-blocker.  7 obesity-we discussed the importance of weight loss.  Kirk Ruths, MD

## 2021-05-24 ENCOUNTER — Telehealth (HOSPITAL_COMMUNITY): Payer: Self-pay | Admitting: *Deleted

## 2021-05-24 NOTE — Telephone Encounter (Signed)
Reaching out to patient to offer assistance regarding upcoming cardiac imaging study; pt verbalizes understanding of appt date/time, parking situation and where to check in, pre-test NPO status and medications ordered, and verified current allergies; name and call back number provided for further questions should they arise  Peter Clement RN Navigator Cardiac Imaging [name] [phone] office [phone] cell  Patient instructed by Dr. Macky Lower office to take 37m carvedilol two hours prior to cardiac CT scan. He is aware to arrive at 7:30am for his 8am scan.

## 2021-05-25 ENCOUNTER — Other Ambulatory Visit: Payer: Self-pay

## 2021-05-25 ENCOUNTER — Ambulatory Visit (HOSPITAL_COMMUNITY): Payer: No Typology Code available for payment source

## 2021-05-25 ENCOUNTER — Ambulatory Visit (HOSPITAL_COMMUNITY)
Admission: RE | Admit: 2021-05-25 | Discharge: 2021-05-25 | Disposition: A | Discharge status: Home / Self Care | Payer: No Typology Code available for payment source | Source: Ambulatory Visit | Attending: Cardiology | Admitting: Cardiology

## 2021-05-25 DIAGNOSIS — I4819 Other persistent atrial fibrillation: Secondary | ICD-10-CM

## 2021-05-25 MED ORDER — METOPROLOL TARTRATE 5 MG/5ML IV SOLN
5.0000 mg | INTRAVENOUS | Status: DC | PRN
  Administered 2021-05-25: 10 mg via INTRAVENOUS

## 2021-05-25 MED ORDER — METOPROLOL TARTRATE 5 MG/5ML IV SOLN
INTRAVENOUS | Status: AC
  Administered 2021-05-25: 10 mg via INTRAVENOUS
  Filled 2021-05-25: qty 10

## 2021-05-25 MED ORDER — IOHEXOL 350 MG/ML SOLN
95.0000 mL | Freq: Once | INTRAVENOUS | Status: AC | PRN
  Administered 2021-05-25: 95 mL via INTRAVENOUS

## 2021-05-25 MED ORDER — DILTIAZEM HCL 25 MG/5ML IV SOLN: 5.0000 mg | INTRAVENOUS | Status: DC | PRN

## 2021-05-25 MED ORDER — DILTIAZEM HCL 25 MG/5ML IV SOLN
INTRAVENOUS | Status: AC
  Administered 2021-05-25: 5 mg via INTRAVENOUS
  Filled 2021-05-25: qty 5

## 2021-05-25 MED ORDER — METOPROLOL TARTRATE 5 MG/5ML IV SOLN
INTRAVENOUS | Status: AC
  Filled 2021-05-25: qty 10

## 2021-05-29 NOTE — Pre-Procedure Instructions (Signed)
Instructed patient on the following items: Arrival time 0830 Nothing to eat or drink after midnight No meds AM of procedure Responsible person to drive you home and stay with you for 24 hrs  Have you missed any doses of anti-coagulant Eliquis- hasn't missed any doses   

## 2021-05-30 ENCOUNTER — Ambulatory Visit (HOSPITAL_COMMUNITY): Payer: No Typology Code available for payment source | Admitting: Anesthesiology

## 2021-05-30 ENCOUNTER — Encounter (HOSPITAL_COMMUNITY): Payer: Self-pay | Admitting: Cardiology

## 2021-05-30 ENCOUNTER — Encounter (HOSPITAL_COMMUNITY)
Admission: RE | Disposition: A | Discharge status: Home / Self Care | Payer: Self-pay | Source: Home / Self Care | Attending: Cardiology

## 2021-05-30 ENCOUNTER — Ambulatory Visit (HOSPITAL_COMMUNITY)
Admission: RE | Admit: 2021-05-30 | Discharge: 2021-05-30 | Disposition: A | Discharge status: Home / Self Care | Payer: No Typology Code available for payment source | Attending: Cardiology | Admitting: Cardiology

## 2021-05-30 DIAGNOSIS — I4819 Other persistent atrial fibrillation: Secondary | ICD-10-CM | POA: Insufficient documentation

## 2021-05-30 DIAGNOSIS — I484 Atypical atrial flutter: Secondary | ICD-10-CM | POA: Insufficient documentation

## 2021-05-30 DIAGNOSIS — I2510 Atherosclerotic heart disease of native coronary artery without angina pectoris: Secondary | ICD-10-CM | POA: Diagnosis not present

## 2021-05-30 HISTORY — PX: ATRIAL FIBRILLATION ABLATION: EP1191

## 2021-05-30 LAB — POCT ACTIVATED CLOTTING TIME: Activated Clotting Time: 341 seconds

## 2021-05-30 SURGERY — ATRIAL FIBRILLATION ABLATION
Anesthesia: General

## 2021-05-30 MED ORDER — MIDAZOLAM HCL 2 MG/2ML IJ SOLN
INTRAMUSCULAR | Status: DC | PRN
  Administered 2021-05-30: 2 mg via INTRAVENOUS

## 2021-05-30 MED ORDER — ONDANSETRON HCL 4 MG/2ML IJ SOLN
INTRAMUSCULAR | Status: DC | PRN
  Administered 2021-05-30: 4 mg via INTRAVENOUS

## 2021-05-30 MED ORDER — HEPARIN SODIUM (PORCINE) 1000 UNIT/ML IJ SOLN
INTRAMUSCULAR | Status: AC
  Filled 2021-05-30: qty 10

## 2021-05-30 MED ORDER — DEXAMETHASONE SODIUM PHOSPHATE 10 MG/ML IJ SOLN
INTRAMUSCULAR | Status: DC | PRN
  Administered 2021-05-30: 5 mg via INTRAVENOUS

## 2021-05-30 MED ORDER — FENTANYL CITRATE (PF) 100 MCG/2ML IJ SOLN
INTRAMUSCULAR | Status: DC | PRN
  Administered 2021-05-30: 100 ug via INTRAVENOUS

## 2021-05-30 MED ORDER — SODIUM CHLORIDE 0.9% FLUSH: 3.0000 mL | INTRAVENOUS | Status: DC | PRN

## 2021-05-30 MED ORDER — HEPARIN (PORCINE) IN NACL 1000-0.9 UT/500ML-% IV SOLN
INTRAVENOUS | Status: AC
  Filled 2021-05-30: qty 500

## 2021-05-30 MED ORDER — HEPARIN SODIUM (PORCINE) 1000 UNIT/ML IJ SOLN
INTRAMUSCULAR | Status: DC | PRN
  Administered 2021-05-30: 15000 [IU] via INTRAVENOUS
  Administered 2021-05-30: 1000 [IU] via INTRAVENOUS

## 2021-05-30 MED ORDER — HEPARIN (PORCINE) IN NACL 1000-0.9 UT/500ML-% IV SOLN
INTRAVENOUS | Status: DC | PRN
  Administered 2021-05-30 (×5): 500 mL

## 2021-05-30 MED ORDER — ROCURONIUM BROMIDE 10 MG/ML (PF) SYRINGE
PREFILLED_SYRINGE | INTRAVENOUS | Status: DC | PRN
  Administered 2021-05-30: 60 mg via INTRAVENOUS

## 2021-05-30 MED ORDER — SODIUM CHLORIDE 0.9 % IV SOLN: 250.0000 mL | INTRAVENOUS | Status: DC | PRN

## 2021-05-30 MED ORDER — ONDANSETRON HCL 4 MG/2ML IJ SOLN: 4.0000 mg | Freq: Four times a day (QID) | INTRAMUSCULAR | Status: DC | PRN

## 2021-05-30 MED ORDER — PROPOFOL 10 MG/ML IV BOLUS
INTRAVENOUS | Status: DC | PRN
  Administered 2021-05-30: 160 mg via INTRAVENOUS

## 2021-05-30 MED ORDER — DOBUTAMINE INFUSION FOR EP/ECHO/NUC (1000 MCG/ML)
INTRAVENOUS | Status: DC | PRN
  Administered 2021-05-30: 20 ug/kg/min via INTRAVENOUS

## 2021-05-30 MED ORDER — ACETAMINOPHEN 325 MG PO TABS
650.0000 mg | ORAL_TABLET | ORAL | Status: DC | PRN
  Filled 2021-05-30: qty 2

## 2021-05-30 MED ORDER — PROTAMINE SULFATE 10 MG/ML IV SOLN
INTRAVENOUS | Status: DC | PRN
  Administered 2021-05-30: 40 mg via INTRAVENOUS

## 2021-05-30 MED ORDER — DOBUTAMINE INFUSION FOR EP/ECHO/NUC (1000 MCG/ML)
INTRAVENOUS | Status: AC
  Filled 2021-05-30: qty 250

## 2021-05-30 MED ORDER — HEPARIN SODIUM (PORCINE) 1000 UNIT/ML IJ SOLN
INTRAMUSCULAR | Status: DC | PRN
  Administered 2021-05-30: 1000 [IU] via INTRAVENOUS

## 2021-05-30 MED ORDER — SODIUM CHLORIDE 0.9 % IV SOLN: INTRAVENOUS | Status: DC

## 2021-05-30 MED ORDER — PHENYLEPHRINE HCL-NACL 20-0.9 MG/250ML-% IV SOLN
INTRAVENOUS | Status: DC | PRN
Start: 2021-05-30 — End: 2021-05-30
  Administered 2021-05-30: 50 ug/min via INTRAVENOUS

## 2021-05-30 MED ORDER — LIDOCAINE 2% (20 MG/ML) 5 ML SYRINGE
INTRAMUSCULAR | Status: DC | PRN
  Administered 2021-05-30: 100 mg via INTRAVENOUS

## 2021-05-30 MED ORDER — ACETAMINOPHEN 500 MG PO TABS
1000.0000 mg | ORAL_TABLET | Freq: Once | ORAL | Status: AC
  Administered 2021-05-30: 1000 mg via ORAL
  Filled 2021-05-30: qty 2

## 2021-05-30 MED ORDER — PHENYLEPHRINE 40 MCG/ML (10ML) SYRINGE FOR IV PUSH (FOR BLOOD PRESSURE SUPPORT)
PREFILLED_SYRINGE | INTRAVENOUS | Status: DC | PRN
  Administered 2021-05-30: 160 ug via INTRAVENOUS
  Administered 2021-05-30: 120 ug via INTRAVENOUS
  Administered 2021-05-30: 160 ug via INTRAVENOUS
  Administered 2021-05-30: 120 ug via INTRAVENOUS

## 2021-05-30 MED ORDER — SUGAMMADEX SODIUM 200 MG/2ML IV SOLN
INTRAVENOUS | Status: DC | PRN
Start: 2021-05-30 — End: 2021-05-30
  Administered 2021-05-30 (×2): 200 mg via INTRAVENOUS

## 2021-05-30 SURGICAL SUPPLY — 20 items
BAG SNAP BAND KOVER 36X36 (MISCELLANEOUS) ×2 IMPLANT
CATH OCTARAY 2.0 F 3-3-3-3-3 (CATHETERS) ×2 IMPLANT
CATH S CIRCA THERM PROBE 10F (CATHETERS) ×2 IMPLANT
CATH SMTCH THERMOCOOL SF DF (CATHETERS) ×2 IMPLANT
CATH SOUNDSTAR ECO 8FR (CATHETERS) ×2 IMPLANT
CATH WEBSTER BI DIR CS D-F CRV (CATHETERS) ×2 IMPLANT
CLOSURE PERCLOSE PROSTYLE (VASCULAR PRODUCTS) ×8 IMPLANT
COVER SWIFTLINK CONNECTOR (BAG) ×3 IMPLANT
KIT CATH VERSACROSS STEERABLE (CATHETERS) ×2 IMPLANT
MAT PREVALON FULL STRYKER (MISCELLANEOUS) ×2 IMPLANT
PACK EP LATEX FREE (CUSTOM PROCEDURE TRAY) ×3
PACK EP LF (CUSTOM PROCEDURE TRAY) ×1 IMPLANT
PAD DEFIB RADIO PHYSIO CONN (PAD) ×4 IMPLANT
PATCH CARTO3 (PAD) ×2 IMPLANT
SHEATH CARTO VIZIGO SM CVD (SHEATH) ×2 IMPLANT
SHEATH PINNACLE 7F 10CM (SHEATH) ×2 IMPLANT
SHEATH PINNACLE 8F 10CM (SHEATH) ×4 IMPLANT
SHEATH PINNACLE 9F 10CM (SHEATH) ×2 IMPLANT
SHEATH PROBE COVER 6X72 (BAG) ×2 IMPLANT
TUBING SMART ABLATE COOLFLOW (TUBING) ×2 IMPLANT

## 2021-05-30 NOTE — Anesthesia Postprocedure Evaluation (Signed)
Anesthesia Post Note  Patient: [name]  Procedure(s) Performed: ATRIAL FIBRILLATION ABLATION     Patient location during evaluation: Cath Lab Anesthesia Type: General Level of consciousness: awake and alert Pain management: pain level controlled Vital Signs Assessment: post-procedure vital signs reviewed and stable Respiratory status: spontaneous breathing, nonlabored ventilation, respiratory function stable and patient connected to nasal cannula oxygen Cardiovascular status: blood pressure returned to baseline and stable Postop Assessment: no apparent nausea or vomiting Anesthetic complications: no   There were no known notable events for this encounter.  Last Vitals:  Vitals:   05/30/21 1338 05/30/21 1408  BP: 116/65 108/64  Pulse: 76 83  Resp: 15 13  Temp:    SpO2: 100% 99%    Last Pain:  Vitals:   05/30/21 1254  TempSrc:   PainSc: 2                  [name]

## 2021-05-30 NOTE — Transfer of Care (Signed)
Immediate Anesthesia Transfer of Care Note  Patient: [name]  Procedure(s) Performed: ATRIAL FIBRILLATION ABLATION  Patient Location: Cath Lab  Anesthesia Type:General  Level of Consciousness: drowsy and patient cooperative  Airway & Oxygen Therapy: Patient Spontanous Breathing and Patient connected to nasal cannula oxygen  Post-op Assessment: Report given to RN, Post -op Vital signs reviewed and stable and Patient moving all extremities  Post vital signs: Reviewed and stable  Last Vitals:  Vitals Value Taken Time  BP 96/58 05/30/21 1154  Temp    Pulse 58 05/30/21 1157  Resp 15 05/30/21 1157  SpO2 97 % 05/30/21 1157  Vitals shown include unvalidated device data.  Last Pain:  Vitals:   05/30/21 0921  TempSrc:   PainSc: 0-No pain         Complications: There were no known notable events for this encounter.

## 2021-05-30 NOTE — Discharge Instructions (Signed)

## 2021-05-30 NOTE — H&P (Signed)
Electrophysiology Office Note   Date:  05/30/2021   ID:  [name], DOB 07/24/1950, MRN [id number]  PCP:  [name], Peter Apa, DO  Cardiologist:  Stanford Breed Primary Electrophysiologist:  [name], [name]    No chief complaint on file.     History of Present Illness: [name] is a 71 y.o. male who is being seen today for the evaluation of atrial flutter at the request of [name]. Presenting today for electrophysiology evaluation.    He has a history significant coronary artery disease, atrial flutter status post ablation, mitral regurgitation status post repair in 2009.  He had his atrial flutter ablation in 2009.  He was seen April 2019 in atrial flutter with heart rates of 126.  He was referred for cardioversion.  He had an EP study 01/29/2018 that showed a left atrial flutter.  He was cardioverted at that time.  He is now status post ablation of atypical atrial flutter and atrial fibrillation 01/21/2019.  Today, denies symptoms of palpitations, chest pain, shortness of breath, orthopnea, PND, lower extremity edema, claudication, dizziness, presyncope, syncope, bleeding, or neurologic sequela. The patient is tolerating medications without difficulties. Plan for repeat ablation today.    Past Medical History:  Diagnosis Date   Atrial flutter ([place])    s/p ablation   CAD (coronary artery disease)    cath 06/2007 40% LM   CVD (cerebrovascular disease)    59% right ICA, 49% left ICA; h/oTIA; fu study 8/11 with normal carotids   Dysrhythmia    Gait abnormality    Heart murmur    History of bronchitis    History of colonoscopy    Horseshoe kidney    Hyperlipidemia    MR (mitral regurgitation)    severe; mitral valve repair in march 2009   MVP (mitral valve prolapse)    Osteoarthritis    both knees   Shortness of breath dyspnea    increased exertion    Past Surgical History:  Procedure Laterality Date   A-FLUTTER ABLATION N/A 01/29/2018   Procedure:  A-FLUTTER ABLATION;  Surgeon: [name], [name];  [name];  Service: Cardiovascular;  Laterality: N/A;   ATRIAL FIBRILLATION ABLATION N/A 01/21/2019   Procedure: ATRIAL FIBRILLATION ABLATION;  Surgeon: [name], [name];  Location: [facility];  Service: Cardiovascular;  Laterality: N/A;   BACK SURGERY     secondary to ruptured disc   CARDIAC CATHETERIZATION     CARDIOVERSION N/A 09/23/2017   Procedure: CARDIOVERSION;  Surgeon: [name], [name];  [name];  Service: Cardiovascular;  Laterality: N/A;   heart ablation     MITRAL VALVE REPAIR  06/2007   TEE WITHOUT CARDIOVERSION N/A 09/23/2017   Procedure: TRANSESOPHAGEAL ECHOCARDIOGRAM (TEE);  Surgeon: [name], [name];  [name];  Service: Cardiovascular;  Laterality: N/A;   [name] Bilateral 03/09/2015   Procedure: TOTAL KNEE BILATERAL;  Surgeon: [name], [name];  [name];  Service: Orthopedics;  Laterality: Bilateral;     Current Facility-Administered Medications  Medication Dose Route Frequency Provider Last Rate Last Admin   0.9 %  sodium chloride infusion   Intravenous Continuous [name], [name]       acetaminophen (TYLENOL) tablet 1,000 mg  1,000 mg Oral Once [name], [name]        Allergies:   Praluent [alirocumab], Statins, Codeine, and Tape   Social History:  The patient  reports that he has never smoked. He has never used smokeless tobacco. He reports current alcohol use. He reports that he does not use drugs.   Family History:  The patient's family history includes Colitis in his daughter; Crohn's disease in his daughter; Diabetes in an other family member; Hyperlipidemia in his father; Liver cancer in his father; Liver disease in his father.   ROS:  Please see the history of present illness.   Otherwise, review of systems is positive for none.   All other systems are reviewed and  negative.   PHYSICAL EXAM: VS:  BP (!) 129/95    Pulse 99    Temp 98.1 F (36.7 C) (Oral)    Resp 18    Ht 5' 10"  (1.778 m)    Wt 111.1 kg    SpO2 95%    BMI 35.15 kg/m  , BMI Body mass index is 35.15 kg/m. GEN: Well nourished, well developed, in no acute distress  HEENT: normal  Neck: no JVD, carotid bruits, or masses Cardiac: irregular; no murmurs, rubs, or gallops,no edema  Respiratory:  clear to auscultation bilaterally, normal work of breathing GI: soft, nontender, nondistended, + BS MS: no deformity or atrophy  Skin: warm and dry Neuro:  Strength and sensation are intact Psych: euthymic mood, full affect  Recent Labs: 05/19/2021: BUN 14; Creatinine, Ser 0.95; Hemoglobin 15.7; Platelets 190; Potassium 4.6; Sodium 139    Lipid Panel     Component Value Date/Time   CHOL 146 04/21/2020 0839   TRIG 126 04/21/2020 0839   HDL 49 04/21/2020 0839   CHOLHDL 3.0 04/21/2020 0839   CHOLHDL 3 06/29/2019 1046   VLDL 30.0 06/29/2019 1046   LDLCALC 75 04/21/2020 0839   LDLDIRECT 147.0 01/16/2016 0908     Wt Readings from Last 3 Encounters:  05/30/21 111.1 kg  03/21/21 116.6 kg  04/21/20 117 kg      Other studies Reviewed: Additional studies/ records that were reviewed today include: TEE 06/13/18 Review of the above records today demonstrates:  - Left ventricle: The cavity size was normal. Wall thickness was   normal. Systolic function was normal. The estimated ejection   fraction was in the range of 50% to 55%. The study is not   technically sufficient to allow evaluation of LV diastolic   function. - Mitral valve: Post MV repair ? neo chord no significant residual   MR. - Left atrium: The atrium was severely dilated. - Atrial septum: No defect or patent foramen ovale was identified.  ASSESSMENT AND PLAN:  1.  Atypical atrial flutter: [name] has presented today for surgery, with the diagnosis of AF/flutter.  The various methods of treatment have been discussed  with the patient and family. After consideration of risks, benefits and other options for treatment, the patient has consented to  Procedure(s): Catheter ablation as a surgical intervention .  Risks include but not limited to complete heart block, stroke, esophageal damage, nerve damage, bleeding, vascular damage, tamponade, perforation, MI, and death. The patient's history has been reviewed, patient examined, no change in status, stable for surgery.  I have reviewed the patient's chart and labs.  Questions were answered to the patient's satisfaction.    Dusti Tetro Curt Bears, [name] 05/30/2021 9:21 AM

## 2021-05-30 NOTE — Anesthesia Preprocedure Evaluation (Signed)
Anesthesia Evaluation  Patient identified by MRN, date of birth, ID band Patient awake    Reviewed: Allergy & Precautions, NPO status , Patient's Chart, lab work & pertinent test results  Airway Mallampati: III  TM Distance: >3 FB Neck ROM: Full    Dental  (+) Teeth Intact, Dental Advisory Given   Pulmonary neg pulmonary ROS,    Pulmonary exam normal breath sounds clear to auscultation       Cardiovascular hypertension, (-) angina+ CAD and + Peripheral Vascular Disease  Normal cardiovascular exam+ dysrhythmias Atrial Fibrillation + Valvular Problems/Murmurs MVP and MR  Rhythm:Regular Rate:Normal     Neuro/Psych negative neurological ROS  negative psych ROS   GI/Hepatic negative GI ROS, Neg liver ROS,   Endo/Other  Obesity   Renal/GU negative Renal ROS     Musculoskeletal  (+) Arthritis ,   Abdominal   Peds  Hematology negative hematology ROS (+)   Anesthesia Other Findings Day of surgery medications reviewed with the patient.  Reproductive/Obstetrics                             Anesthesia Physical Anesthesia Plan  ASA: 3  Anesthesia Plan: General   Post-op Pain Management: Tylenol PO (pre-op)   Induction: Intravenous  PONV Risk Score and Plan: 2 and Dexamethasone and Ondansetron  Airway Management Planned: Oral ETT  Additional Equipment:   Intra-op Plan:   Post-operative Plan: Extubation in OR  Informed Consent: I have reviewed the patients History and Physical, chart, labs and discussed the procedure including the risks, benefits and alternatives for the proposed anesthesia with the patient or authorized representative who has indicated his/her understanding and acceptance.     Dental advisory given  Plan Discussed with: CRNA  Anesthesia Plan Comments:         Anesthesia Quick Evaluation

## 2021-05-30 NOTE — Anesthesia Procedure Notes (Signed)
Procedure Name: Intubation Date/Time: 05/30/2021 10:25 AM Performed by: Moshe Salisbury, CRNA Pre-anesthesia Checklist: Patient identified, Emergency Drugs available, Suction available and Patient being monitored Patient Re-evaluated:Patient Re-evaluated prior to induction Oxygen Delivery Method: Circle System Utilized Preoxygenation: Pre-oxygenation with 100% oxygen Induction Type: IV induction Ventilation: Mask ventilation with difficulty Laryngoscope Size: Mac and 4 Grade View: Grade III Tube type: Oral Tube size: 8.0 mm Number of attempts: 1 Airway Equipment and Method: Stylet Placement Confirmation: ETT inserted through vocal cords under direct vision, positive ETCO2 and breath sounds checked- equal and bilateral Secured at: 23 cm Tube secured with: Tape Dental Injury: Teeth and Oropharynx as per pre-operative assessment

## 2021-06-06 ENCOUNTER — Other Ambulatory Visit: Payer: Self-pay

## 2021-06-06 ENCOUNTER — Ambulatory Visit: Payer: No Typology Code available for payment source | Admitting: Cardiology

## 2021-06-06 ENCOUNTER — Encounter: Payer: Self-pay | Admitting: Cardiology

## 2021-06-06 VITALS — BP 126/84 | HR 71 | Ht 70.0 in | Wt 256.0 lb

## 2021-06-06 DIAGNOSIS — E785 Hyperlipidemia, unspecified: Secondary | ICD-10-CM | POA: Diagnosis not present

## 2021-06-06 DIAGNOSIS — Z9889 Other specified postprocedural states: Secondary | ICD-10-CM | POA: Diagnosis not present

## 2021-06-06 DIAGNOSIS — I10 Essential (primary) hypertension: Secondary | ICD-10-CM

## 2021-06-06 DIAGNOSIS — I420 Dilated cardiomyopathy: Secondary | ICD-10-CM

## 2021-06-06 DIAGNOSIS — I2510 Atherosclerotic heart disease of native coronary artery without angina pectoris: Secondary | ICD-10-CM

## 2021-06-06 DIAGNOSIS — I484 Atypical atrial flutter: Secondary | ICD-10-CM

## 2021-06-06 LAB — HEPATIC FUNCTION PANEL
ALT: 61 IU/L — ABNORMAL HIGH (ref 0–44)
AST: 33 IU/L (ref 0–40)
Albumin: 4.5 g/dL (ref 3.8–4.8)
Alkaline Phosphatase: 65 IU/L (ref 44–121)
Bilirubin Total: 0.8 mg/dL (ref 0.0–1.2)
Bilirubin, Direct: 0.21 mg/dL (ref 0.00–0.40)
Total Protein: 6.7 g/dL (ref 6.0–8.5)

## 2021-06-06 LAB — LIPID PANEL
Chol/HDL Ratio: 2.7 ratio (ref 0.0–5.0)
Cholesterol, Total: 134 mg/dL (ref 100–199)
HDL: 49 mg/dL (ref >39)
LDL Chol Calc (NIH): 61 mg/dL (ref 0–99)
Triglycerides: 141 mg/dL (ref 0–149)
VLDL Cholesterol Cal: 24 mg/dL (ref 5–40)

## 2021-06-06 NOTE — Patient Instructions (Signed)

## 2021-06-25 ENCOUNTER — Other Ambulatory Visit: Payer: Self-pay | Admitting: Family Medicine

## 2021-06-25 DIAGNOSIS — J069 Acute upper respiratory infection, unspecified: Secondary | ICD-10-CM

## 2021-06-27 ENCOUNTER — Ambulatory Visit (HOSPITAL_COMMUNITY): Payer: No Typology Code available for payment source | Admitting: Physician Assistant

## 2021-07-05 ENCOUNTER — Other Ambulatory Visit: Payer: Self-pay

## 2021-07-05 ENCOUNTER — Ambulatory Visit (HOSPITAL_COMMUNITY)
Admission: RE | Admit: 2021-07-05 | Discharge: 2021-07-05 | Disposition: A | Discharge status: Home / Self Care | Payer: No Typology Code available for payment source | Source: Ambulatory Visit | Attending: Physician Assistant | Admitting: Physician Assistant

## 2021-07-05 ENCOUNTER — Encounter (HOSPITAL_COMMUNITY): Payer: Self-pay | Admitting: Physician Assistant

## 2021-07-05 VITALS — BP 136/72 | HR 77 | Ht 70.0 in | Wt 253.2 lb

## 2021-07-05 DIAGNOSIS — I4892 Unspecified atrial flutter: Secondary | ICD-10-CM | POA: Diagnosis not present

## 2021-07-05 DIAGNOSIS — I2510 Atherosclerotic heart disease of native coronary artery without angina pectoris: Secondary | ICD-10-CM | POA: Diagnosis not present

## 2021-07-05 DIAGNOSIS — I5022 Chronic systolic (congestive) heart failure: Secondary | ICD-10-CM | POA: Diagnosis not present

## 2021-07-05 DIAGNOSIS — I10 Essential (primary) hypertension: Secondary | ICD-10-CM | POA: Diagnosis not present

## 2021-07-05 DIAGNOSIS — Z7901 Long term (current) use of anticoagulants: Secondary | ICD-10-CM | POA: Insufficient documentation

## 2021-07-05 DIAGNOSIS — E669 Obesity, unspecified: Secondary | ICD-10-CM | POA: Insufficient documentation

## 2021-07-05 DIAGNOSIS — I4819 Other persistent atrial fibrillation: Secondary | ICD-10-CM | POA: Insufficient documentation

## 2021-07-05 DIAGNOSIS — Z6836 Body mass index (BMI) 36.0-36.9, adult: Secondary | ICD-10-CM | POA: Insufficient documentation

## 2021-07-05 DIAGNOSIS — D6869 Other thrombophilia: Secondary | ICD-10-CM | POA: Diagnosis not present

## 2021-07-05 NOTE — Progress Notes (Signed)
Primary Care Physician: Carollee Herter, Alferd Apa, DO Primary Cardiologist: Dr Stanford Breed Referring Physician:Dr. Gabriel Earing is a 71 y.o. male with a h/o CAD, EF of 35-40%,CVD, MVR in 2009, who presented for a typical atrial flutter ablation but it was found that he had left atrial atypical flutter ablation, so procedure was not done and the pt was cardioverted and d/c home. He is now s/p afib and left atrial flutter ablation with Dr Curt Bears on 01/21/19.   On follow up today, patient is s/p repeat ablation with Dr Curt Bears on 05/30/21. Patient reports that he has done well since the ablation with no heart racing or palpitations. He denies CP, swallowing pain, or groin issues.   Today, he denies symptoms of palpitations, chest pain, shortness of breath, orthopnea, PND, lower extremity edema, dizziness, presyncope, syncope, or neurologic sequela. The patient is tolerating medications without difficulties and is otherwise without complaint today.   Past Medical History:  Diagnosis Date   Atrial flutter ([place])    s/p ablation   CAD (coronary artery disease)    cath 06/2007 40% LM   CVD (cerebrovascular disease)    59% right ICA, 49% left ICA; h/oTIA; fu study 8/11 with normal carotids   Dysrhythmia    Gait abnormality    Heart murmur    History of bronchitis    History of colonoscopy    Horseshoe kidney    Hyperlipidemia    MR (mitral regurgitation)    severe; mitral valve repair in march 2009   MVP (mitral valve prolapse)    Osteoarthritis    both knees   Shortness of breath dyspnea    increased exertion    Past Surgical History:  Procedure Laterality Date   A-FLUTTER ABLATION N/A 01/29/2018   Procedure: A-FLUTTER ABLATION;  Surgeon: Constance Haw, MD;  Location: [facility];  Service: Cardiovascular;  Laterality: N/A;   ATRIAL FIBRILLATION ABLATION N/A 01/21/2019   Procedure: ATRIAL FIBRILLATION ABLATION;  Surgeon: Constance Haw, MD;  Location: [facility];  Service: Cardiovascular;  Laterality: N/A;   ATRIAL FIBRILLATION ABLATION N/A 05/30/2021   Procedure: ATRIAL FIBRILLATION ABLATION;  Surgeon: Constance Haw, MD;  Location: [facility];  Service: Cardiovascular;  Laterality: N/A;   BACK SURGERY     secondary to ruptured disc   CARDIAC CATHETERIZATION     CARDIOVERSION N/A 09/23/2017   Procedure: CARDIOVERSION;  Surgeon: Lelon Perla, MD;  Location: [facility];  Service: Cardiovascular;  Laterality: N/A;   heart ablation     MITRAL VALVE REPAIR  06/2007   TEE WITHOUT CARDIOVERSION N/A 09/23/2017   Procedure: TRANSESOPHAGEAL ECHOCARDIOGRAM (TEE);  Surgeon: Lelon Perla, MD;  Location: [facility];  Service: Cardiovascular;  Laterality: N/A;   [place] Bilateral 03/09/2015   Procedure: TOTAL KNEE BILATERAL;  Surgeon: Gaynelle Arabian, MD;  Location: [facility];  Service: Orthopedics;  Laterality: Bilateral;    Current Outpatient Medications  Medication Sig Dispense Refill   amLODipine (NORVASC) 5 MG tablet TAKE 1 TABLET BY MOUTH EVERY DAY 90 tablet 3   amoxicillin (AMOXIL) 500 MG capsule Take 2,000 mg by mouth See admin instructions. Take 2000 mg by mouth 1 hour dental procedure     carvedilol (COREG) 12.5 MG tablet TAKE 1 TABLET BY MOUTH TWICE A DAY 180 tablet 4   ELIQUIS 5 MG TABS tablet TAKE 1 TABLET BY MOUTH TWICE A DAY *NOT COVERED BY INSURANCE*  180 tablet 1   meloxicam (MOBIC) 15 MG tablet TAKE 1 TABLET BY MOUTH EVERY DAY AS NEEDED FOR PAIN 90 tablet 0   Menthol, Topical Analgesic, (ICY HOT EX) Apply 1 application topically daily as needed (muscle pain).     Multiple Vitamins-Minerals (CENTRUM SILVER PO) Take 1 tablet by mouth daily.     REPATHA SURECLICK 937 MG/ML SOAJ INJECT 140 MG INTO THE SKIN EVERY 14 (FOURTEEN) DAYS. 6 mL 3   Saw Palmetto 500 MG CAPS Take 500 mg by mouth every other day. 585 mg     sodium chloride (OCEAN) 0.65 % SOLN nasal spray Place 1 spray  into both nostrils at bedtime as needed (nasal cleansing).     telmisartan (MICARDIS) 80 MG tablet TAKE 1 TABLET BY MOUTH EVERY DAY 90 tablet 3   Vitamin E 180 MG (400 UNIT) CAPS Take 400 Units by mouth daily.     No current facility-administered medications for this encounter.    Allergies  Allergen Reactions   Praluent [Alirocumab] Shortness Of Breath   Statins Other (See Comments)    elevated liver function    Codeine Nausea Only   Tape Rash    Social History   Socioeconomic History   Marital status: Married    Spouse name: Not on file   Number of children: Not on file   Years of education: Not on file   Highest education level: Not on file  Occupational History   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    Comment: works from home  Tobacco Use   Smoking status: Never   Smokeless tobacco: Never  Vaping Use   Vaping Use: Never used  Substance and Sexual Activity   Alcohol use: Not Currently    Comment: occasional 1-2 wine drinks a month   Drug use: No   Sexual activity: Yes    Partners: Female  Other Topics Concern   Not on file  Social History Narrative   Exercise--  [employer] gym   Social Determinants of Health   Financial Resource Strain: Not on file  Food Insecurity: Not on file  Transportation Needs: Not on file  Physical Activity: Not on file  Stress: Not on file  Social Connections: Not on file  Intimate Partner Violence: Not on file    Family History  Problem Relation Age of Onset   Liver disease Father        cancer   Hyperlipidemia Father    Liver cancer Father    Diabetes Other    Colitis Daughter    Crohn's disease Daughter    Colon cancer Neg Hx    Esophageal cancer Neg Hx    Rectal cancer Neg Hx    Stomach cancer Neg Hx     ROS- All systems are reviewed and negative except as per the HPI above  Physical Exam: Vitals:   07/05/21 1430  BP: 136/72  Pulse: 77  Weight: 114.9 kg  Height: 5' 10"  (1.778 m)   Wt Readings from Last 3 Encounters:   07/05/21 114.9 kg  06/06/21 116.1 kg  05/30/21 111.1 kg    Labs: Lab Results  Component Value Date   NA 139 05/19/2021   K 4.6 05/19/2021   CL 102 05/19/2021   CO2 23 05/19/2021   GLUCOSE 110 (H) 05/19/2021   BUN 14 05/19/2021   CREATININE 0.95 05/19/2021   CALCIUM 9.4 05/19/2021   PHOS 3.5 08/09/2007   MG 2.8 (H) 06/25/2007   Lab Results  Component Value Date  INR 2.34 (H) 03/23/2015   Lab Results  Component Value Date   CHOL 134 06/06/2021   HDL 49 06/06/2021   LDLCALC 61 06/06/2021   TRIG 141 06/06/2021    GEN- The patient is a well appearing obese male, alert and oriented x 3 today.   HEENT-head normocephalic, atraumatic, sclera clear, conjunctiva pink, hearing intact, trachea midline. Lungs- Clear to ausculation bilaterally, normal work of breathing Heart- Regular rate and rhythm, no murmurs, rubs or gallops  GI- soft, NT, ND, + BS Extremities- no clubbing, cyanosis, or edema MS- no significant deformity or atrophy Skin- no rash or lesion Psych- euthymic mood, full affect Neuro- strength and sensation are intact   EKG-  SR, 1st degree AV block Vent. rate 77 BPM PR interval 238 ms QRS duration 100 ms QT/QTcB 386/436 ms  Echo 05/17/20 1. Left ventricular ejection fraction, by estimation, is 45 to 50%. The  left ventricle has mildly decreased function. The left ventricle  demonstrates global hypokinesis. Left ventricular diastolic parameters are indeterminate.   2. Right ventricular systolic function is normal. The right ventricular  size is normal.   3. Left atrial size was moderately dilated.   4. The mitral valve has been repaired/replaced. No evidence of mitral  valve regurgitation. No evidence of mitral stenosis. The mean mitral valve gradient is 4.0 mmHg. There is a prosthetic annuloplasty ring present in the mitral position.   5. The aortic valve is normal in structure. Aortic valve regurgitation is  not visualized. No aortic stenosis is  present.   6. 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 There is  moderate dilatation at the level of the sinuses of Valsalva, measuring 45 mm. There is borderline dilatation of the ascending aorta, measuring 36 mm.   7. The inferior vena cava is normal in size with greater than 50%  respiratory variability, suggesting right atrial pressure of 3 mmHg.   Comparison(s): No significant change from prior study. Prior images  reviewed side by side.   CHA2DS2-VASc Score = 4  The patient's score is based upon: CHF History: 1 HTN History: 1 Diabetes History: 0 Stroke History: 0 Vascular Disease History: 1 Age Score: 1 Gender Score: 0       ASSESSMENT AND PLAN: 1. Persistent Atrial Fibrillation/atrial flutter The patient's CHA2DS2-VASc score is 4, indicating a 4.8% annual risk of stroke.   S/p afib and left atrial flutter ablation on 01/21/19 with repeat ablation 05/30/21. Patient appears to be maintaining SR. Continue Eliquis 5 mg BID with no missed doses for at least 3 months post ablation. Continue Coreg 12.5 mg BID  2. Secondary Hypercoagulable State (ICD10:  D68.69) The patient is at significant risk for stroke/thromboembolism based upon his CHA2DS2-VASc Score of 4.  Continue Apixaban (Eliquis).   3. HTN Stable, no changes today.  4. CAD No anginal symptoms.  5. Chronic systolic CHF Appears euvolemic today.  6. Obesity Body mass index is 36.33 kg/m. Lifestyle modification was discussed and encouraged including regular physical activity and weight reduction.   Follow up with Dr Curt Bears as scheduled.    [facility] [address] [place],  [address] [phone]

## 2021-07-06 DIAGNOSIS — D6869 Other thrombophilia: Secondary | ICD-10-CM | POA: Insufficient documentation

## 2021-07-06 DIAGNOSIS — I4819 Other persistent atrial fibrillation: Secondary | ICD-10-CM | POA: Insufficient documentation

## 2021-09-05 ENCOUNTER — Ambulatory Visit: Payer: Managed Care, Other (non HMO) | Admitting: Cardiology

## 2021-09-05 ENCOUNTER — Encounter: Payer: Self-pay | Admitting: Cardiology

## 2021-09-05 VITALS — BP 126/82 | HR 60 | Ht 70.0 in | Wt 252.0 lb

## 2021-09-05 DIAGNOSIS — I484 Atypical atrial flutter: Secondary | ICD-10-CM

## 2021-09-05 NOTE — Progress Notes (Signed)
? ?Electrophysiology Office Note ? ? ?Date:  09/05/2021  ? ?ID:  [name], DOB 08/31/1950, MRN [id number] ? ?PCP:  Ann Held, DO  ?Cardiologist:  Stanford Breed ?Primary Electrophysiologist:  Illya Gienger Meredith Leeds, MD   ? ?No chief complaint on file. ? ? ?  ?History of Present Illness: ?[name] is a 71 y.o. male who is being seen today for the evaluation of atrial flutter at the request of Fabian Sharp. Presenting today for electrophysiology evaluation.   ? ?Has a history seen for coronary artery disease, atrial flutter status post ablation, mitral regurgitation status postrepair in 2009.  He had atrial flutter ablation in 2009.  He was seen April 2019 with atrial flutter and heart rates of 126.  He had an ablation for atypical atrial flutter which terminated with ablation from the right superior pulmonary vein and second episode of atrial flutter with repeat ablation 05/30/2021 with termination of atrial flutter with ablation across the roof of the left atrium. ? ?Today, denies symptoms of palpitations, chest pain, shortness of breath, orthopnea, PND, lower extremity edema, claudication, dizziness, presyncope, syncope, bleeding, or neurologic sequela. The patient is tolerating medications without difficulties.  Since being seen he has done well.  He has had no chest pain or shortness of breath.  He is overall happy with his control.  He has not had any further episodes of atrial flutter.  He is able to do all of his daily activities without restriction. ? ? ?Past Medical History:  ?Diagnosis Date  ? Atrial flutter ([place])   ? s/p ablation  ? CAD (coronary artery disease)   ? cath 06/2007 40% LM  ? CVD (cerebrovascular disease)   ? 59% right ICA, 49% left ICA; h/oTIA; fu study 8/11 with normal carotids  ? Dysrhythmia   ? Gait abnormality   ? Heart murmur   ? History of bronchitis   ? History of colonoscopy   ? Horseshoe kidney   ? Hyperlipidemia   ? MR (mitral regurgitation)   ? severe; mitral valve  repair in march 2009  ? MVP (mitral valve prolapse)   ? Osteoarthritis   ? both knees  ? Shortness of breath dyspnea   ? increased exertion   ? ?Past Surgical History:  ?Procedure Laterality Date  ? A-FLUTTER ABLATION N/A 01/29/2018  ? Procedure: A-FLUTTER ABLATION;  Surgeon: Constance Haw, MD;  Location: [place] [facility];  Service: Cardiovascular;  Laterality: N/A;  ? ATRIAL FIBRILLATION ABLATION N/A 01/21/2019  ? Procedure: ATRIAL FIBRILLATION ABLATION;  Surgeon: Constance Haw, MD;  Location: [facility] [facility];  Service: Cardiovascular;  Laterality: N/A;  ? ATRIAL FIBRILLATION ABLATION N/A 05/30/2021  ? Procedure: ATRIAL FIBRILLATION ABLATION;  Surgeon: Constance Haw, MD;  Location: [place] [facility];  Service: Cardiovascular;  Laterality: N/A;  ? BACK SURGERY    ? secondary to ruptured disc  ? CARDIAC CATHETERIZATION    ? CARDIOVERSION N/A 09/23/2017  ? Procedure: CARDIOVERSION;  Surgeon: Lelon Perla, MD;  Location: [facility];  Service: Cardiovascular;  Laterality: N/A;  ? heart ablation    ? MITRAL VALVE REPAIR  06/2007  ? TEE WITHOUT CARDIOVERSION N/A 09/23/2017  ? Procedure: TRANSESOPHAGEAL ECHOCARDIOGRAM (TEE);  Surgeon: Lelon Perla, MD;  Location: [place];  Service: Cardiovascular;  Laterality: N/A;  ? TONSILLECTOMY  1960  ? TOTAL KNEE ARTHROPLASTY Bilateral 03/09/2015  ? Procedure: TOTAL KNEE BILATERAL;  Surgeon: Gaynelle Arabian, MD;  Location: [facility];  Service: Orthopedics;  Laterality: Bilateral;  ? ? ? ?Current Outpatient Medications  ?Medication Sig Dispense Refill  ? amLODipine (NORVASC) 5 MG tablet TAKE 1 TABLET BY MOUTH EVERY DAY 90 tablet 3  ? amoxicillin (AMOXIL) 500 MG capsule Take 2,000 mg by mouth See admin instructions. Take 2000 mg by mouth 1 hour dental procedure    ? carvedilol (COREG) 12.5 MG tablet TAKE 1 TABLET BY MOUTH TWICE A DAY 180 tablet 4  ? ELIQUIS 5 MG TABS tablet TAKE 1 TABLET BY MOUTH TWICE A DAY *NOT COVERED BY INSURANCE* 180 tablet 1  ?  Menthol, Topical Analgesic, (ICY HOT EX) Apply 1 application topically daily as needed (muscle pain).    ? Multiple Vitamins-Minerals (CENTRUM SILVER PO) Take 1 tablet by mouth daily.    ? REPATHA SURECLICK 599 MG/ML SOAJ INJECT 140 MG INTO THE SKIN EVERY 14 (FOURTEEN) DAYS. 6 mL 3  ? Saw Palmetto 500 MG CAPS Take 500 mg by mouth every other day. 585 mg    ? sodium chloride (OCEAN) 0.65 % SOLN nasal spray Place 1 spray into both nostrils at bedtime as needed (nasal cleansing).    ? telmisartan (MICARDIS) 80 MG tablet TAKE 1 TABLET BY MOUTH EVERY DAY 90 tablet 3  ? Vitamin E 180 MG (400 UNIT) CAPS Take 400 Units by mouth daily.    ? ?No current facility-administered medications for this visit.  ? ? ?Allergies:   Praluent [alirocumab], Statins, Codeine, and Tape  ? ?Social History:  The patient  reports that he has never smoked. He has never used smokeless tobacco. He reports that he does not currently use alcohol. He reports that he does not use drugs.  ? ?Family History:  The patient's family history includes Colitis in his daughter; Crohn's disease in his daughter; Diabetes in an other family member; Hyperlipidemia in his father; Liver cancer in his father; Liver disease in his father.  ? ?ROS:  Please see the history of present illness.   Otherwise, review of systems is positive for none.   All other systems are reviewed and negative.  ? ?PHYSICAL EXAM: ?VS:  BP 126/82   Pulse 60   Ht 5' 10"  (1.778 m)   Wt 252 lb (114.3 kg)   SpO2 95%   BMI 36.16 kg/m?  , BMI Body mass index is 36.16 kg/m?. ?GEN: Well nourished, well developed, in no acute distress  ?HEENT: normal  ?Neck: no JVD, carotid bruits, or masses ?Cardiac: RRR; no murmurs, rubs, or gallops,no edema  ?Respiratory:  clear to auscultation bilaterally, normal work of breathing ?GI: soft, nontender, nondistended, + BS ?MS: no deformity or atrophy  ?Skin: warm and dry ?Neuro:  Strength and sensation are intact ?Psych: euthymic mood, full affect ? ?EKG:   EKG is ordered today. ?Personal review of the ekg ordered shows sinus rhythm, rate 60 ? ?Recent Labs: ?05/19/2021: BUN 14; Creatinine, Ser 0.95; Hemoglobin 15.7; Platelets 190; Potassium 4.6; Sodium 139 ?06/06/2021: ALT 61  ? ? ?Lipid Panel  ?   ?Component Value Date/Time  ? CHOL 134 06/06/2021 0836  ? TRIG 141 06/06/2021 0836  ? HDL 49 06/06/2021 0836  ? CHOLHDL 2.7 06/06/2021 0836  ? CHOLHDL 3 06/29/2019 1046  ? VLDL 30.0 06/29/2019 1046  ? [place] 61 06/06/2021 0836  ? LDLDIRECT 147.0 01/16/2016 0908  ? ? ? ?Wt Readings from Last 3 Encounters:  ?09/05/21 252 lb (114.3 kg)  ?07/05/21 253 lb 3.2 oz (114.9 kg)  ?06/06/21 256 lb (116.1 kg)  ?  ? ? ?  Other studies Reviewed: ?Additional studies/ records that were reviewed today include: TEE 06/13/18 ?Review of the above records today demonstrates:  ?- Left ventricle: The cavity size was normal. Wall thickness was ?  normal. Systolic function was normal. The estimated ejection ?  fraction was in the range of 50% to 55%. The study is not ?  technically sufficient to allow evaluation of LV diastolic ?  function. ?- Mitral valve: Post MV repair ? neo chord no significant residual ?  MR. ?- Left atrium: The atrium was severely dilated. ?- Atrial septum: No defect or patent foramen ovale was identified. ? ?ASSESSMENT AND PLAN: ? ?1.  Atypical atrial flutter: Status post his initial ablation 01/21/2019 with tachycardia terminating with a mitral valve line.  Status post repeat ablation 05/30/2021 with termination across the roof of the left atrium.  CHA2DS2-VASc of 3.  Currently on Eliquis 5 mg twice daily.  He has had no further atrial arrhythmias.  He is overall quite happy with his control.  We Areej Tayler continue with current management. ? ?2.  Hypertension: Currently well controlled ? ?3.  Chronic systolic heart failure: Likely tachycardia mediated.  Plan per primary cardiology.  No obvious volume overload. ? ?4.  Coronary artery disease: Mild without chest pain. ? ?5.  Obesity: Body  mass index is 36.16 kg/m?. ?His been referred to weight loss clinic ? ? ?Current medicines are reviewed at length with the patient today.   ?The patient does not have concerns regarding his medicines.  The following

## 2021-09-24 ENCOUNTER — Other Ambulatory Visit: Payer: Self-pay | Admitting: Cardiology

## 2021-09-24 DIAGNOSIS — I4891 Unspecified atrial fibrillation: Secondary | ICD-10-CM

## 2021-09-24 DIAGNOSIS — I10 Essential (primary) hypertension: Secondary | ICD-10-CM

## 2021-09-24 DIAGNOSIS — I484 Atypical atrial flutter: Secondary | ICD-10-CM

## 2021-09-25 NOTE — Telephone Encounter (Signed)
Prescription refill request for Eliquis received. ?Indication:Afib ?Last office visit:2/23 ?Scr:0.9 ?Age: 71 ?Weight:114.3 kg ? ?Prescription refilled ? ?

## 2021-12-24 NOTE — Progress Notes (Unsigned)
Cardiology Clinic Note   Patient Name: [name] Date of Encounter: 12/26/2021  Primary Care Provider:  Carollee Herter, Alferd Apa, DO Primary Cardiologist:  Kirk Ruths, MD  Patient Profile    [name] 71 year old male presents clinic today for follow-up evaluation of his coronary artery disease and essential hypertension.  Past Medical History    Past Medical History:  Diagnosis Date   Atrial flutter ([place])    s/p ablation   CAD (coronary artery disease)    cath 06/2007 40% LM   CVD (cerebrovascular disease)    59% right ICA, 49% left ICA; h/oTIA; fu study 8/11 with normal carotids   Dysrhythmia    Gait abnormality    Heart murmur    History of bronchitis    History of colonoscopy    Horseshoe kidney    Hyperlipidemia    MR (mitral regurgitation)    severe; mitral valve repair in march 2009   MVP (mitral valve prolapse)    Osteoarthritis    both knees   Shortness of breath dyspnea    increased exertion    Past Surgical History:  Procedure Laterality Date   A-FLUTTER ABLATION N/A 01/29/2018   Procedure: A-FLUTTER ABLATION;  Surgeon: Constance Haw, MD;  Location: [place] [facility];  Service: Cardiovascular;  Laterality: N/A;   ATRIAL FIBRILLATION ABLATION N/A 01/21/2019   Procedure: ATRIAL FIBRILLATION ABLATION;  Surgeon: Constance Haw, MD;  Location: [facility];  Service: Cardiovascular;  Laterality: N/A;   ATRIAL FIBRILLATION ABLATION N/A 05/30/2021   Procedure: ATRIAL FIBRILLATION ABLATION;  Surgeon: Constance Haw, MD;  Location: [place];  Service: Cardiovascular;  Laterality: N/A;   BACK SURGERY     secondary to ruptured disc   CARDIAC CATHETERIZATION     CARDIOVERSION N/A 09/23/2017   Procedure: CARDIOVERSION;  Surgeon: Lelon Perla, MD;  Location: [place];  Service: Cardiovascular;  Laterality: N/A;   heart ablation     MITRAL VALVE REPAIR  06/2007   TEE WITHOUT CARDIOVERSION N/A 09/23/2017   Procedure:  TRANSESOPHAGEAL ECHOCARDIOGRAM (TEE);  Surgeon: Lelon Perla, MD;  Location: [facility];  Service: Cardiovascular;  Laterality: N/A;   [place] Bilateral 03/09/2015   Procedure: TOTAL KNEE BILATERAL;  Surgeon: Gaynelle Arabian, MD;  Location: [facility];  Service: Orthopedics;  Laterality: Bilateral;    Allergies  Allergies  Allergen Reactions   Praluent [Alirocumab] Shortness Of Breath   Statins Other (See Comments)    elevated liver function    Codeine Nausea Only   Tape Rash    History of Present Illness    [name] has a PMH of hyperlipidemia, mitral valve stenosis status post mitral valve repair, coronary artery disease, essential hypertension, atrial flutter, and dilated cardiomyopathy.  He was noted to have severe mitral valve regurgitation 2/09.  His preoperative cardiac catheterization showed 40% left main, 30% LAD, and 30% RCA stenosis.  The underwent atrial fibrillation ablation 3/09.  His carotid Dopplers 8/11 were normal.  The was noted to have elevated LFTs with statin therapy previously.  His stress test 10/16 showed an EF of 49% and normal perfusion.  He underwent TEE cardioversion for atrial flutter 5/19.  At that time his ejection fraction was 35-40%.  He was noted to have mildly dilated aortic root, mild mitral stenosis, and mitral regurgitation, severe left atrial enlargement.  His echocardiogram 12/21 showed an EF of 45-50%, moderate LAE, status post  mitral valve repair with a mean gradient of 4 mmHg and no mitral valve regurgitation.  His aortic root was noted to be moderately dilated at 45 mm.  He was seen by Dr. Curt Bears and was noted to have recurrent atrial flutter 11/22.  A cardiac CTA 1/23 showed a calcium score 117 and mild nonobstructive CAD.  He underwent ablation procedure 1/23.  He was seen in follow-up by Dr. Stanford Breed on 06/06/2021.  During that time he did note some dyspnea on exertion.  He denied orthopnea and PND.   He denied lower extremity swelling chest discomfort, palpitations, and bleeding issues.  He presents to the clinic today for follow-up evaluation states he feels well.  We reviewed his previous lab work.  He has been working on losing weight and been taking vitamin E to help with his liver health.  He is getting ready to retire January.  He has some concern about the cost of his Repatha and his Eliquis as he transitions to Medicare.  I will order a BMP, CBC, fasting lipids and LFTs, continue his current medication regimen, and plan follow-up in December with Dr. Stanford Breed.  Today he denies chest pain, shortness of breath, lower extremity edema, fatigue, palpitations, melena, hematuria, hemoptysis, diaphoresis, weakness, presyncope, syncope, orthopnea, and PND.     Home Medications    Prior to Admission medications   Medication Sig Start Date End Date Taking? Authorizing Provider  amLODipine (NORVASC) 5 MG tablet TAKE 1 TABLET BY MOUTH EVERY DAY 09/25/21   Lelon Perla, MD  amoxicillin (AMOXIL) 500 MG capsule Take 2,000 mg by mouth See admin instructions. Take 2000 mg by mouth 1 hour dental procedure 12/31/16   [provider]  carvedilol (COREG) 12.5 MG tablet TAKE 1 TABLET BY MOUTH TWICE A DAY 02/17/21   Crenshaw, Denice Bors, MD  ELIQUIS 5 MG TABS tablet TAKE 1 TABLET BY MOUTH TWICE A DAY *NOT COVERED BY INSURANCE* 09/25/21   Lelon Perla, MD  Menthol, Topical Analgesic, (ICY HOT EX) Apply 1 application topically daily as needed (muscle pain).    [provider]  Multiple Vitamins-Minerals (CENTRUM SILVER PO) Take 1 tablet by mouth daily.    [provider]  REPATHA SURECLICK 633 MG/ML SOAJ INJECT 140 MG INTO THE SKIN EVERY 14 (FOURTEEN) DAYS. 09/25/21   Lelon Perla, MD  Saw Palmetto 500 MG CAPS Take 500 mg by mouth every other day. 585 mg 04/20/20   [provider]  sodium chloride (OCEAN) 0.65 % SOLN nasal spray Place 1 spray into both nostrils at  bedtime as needed (nasal cleansing).    [provider]  telmisartan (MICARDIS) 80 MG tablet TAKE 1 TABLET BY MOUTH EVERY DAY 03/27/21   Lelon Perla, MD  Vitamin E 180 MG (400 UNIT) CAPS Take 400 Units by mouth daily. 02/18/21   [provider]    Family History    Family History  Problem Relation Age of Onset   Liver disease Father        cancer   Hyperlipidemia Father    Liver cancer Father    Diabetes Other    Colitis Daughter    Crohn's disease Daughter    Colon cancer Neg Hx    Esophageal cancer Neg Hx    Rectal cancer Neg Hx    Stomach cancer Neg Hx    He indicated that his mother is alive. He indicated that his father is deceased. He indicated that his maternal grandmother is deceased.  He indicated that his maternal grandfather is deceased. He indicated that his paternal grandmother is deceased. He indicated that his paternal grandfather is deceased. He indicated that the status of his neg hx is unknown. He indicated that the status of his other is unknown.  Social History    Social History   Socioeconomic History   Marital status: Married    Spouse name: Not on file   Number of children: Not on file   Years of education: Not on file   Highest education level: Not on file  Occupational History   Occupation: [employer]: works from home  Tobacco Use   Smoking status: Never   Smokeless tobacco: Never  Scientific laboratory technician Use: Never used  Substance and Sexual Activity   Alcohol use: Not Currently    Comment: occasional 1-2 wine drinks a month   Drug use: No   Sexual activity: Yes    Partners: Female  Other Topics Concern   Not on file  Social History Narrative   Exercise--  Total gym   Social Determinants of Health   Financial Resource Strain: Not on file  Food Insecurity: Not on file  Transportation Needs: Not on file  Physical Activity: Not on file  Stress: Not on file  Social Connections: Not on file  Intimate  Partner Violence: Not on file     Review of Systems    General:  No chills, fever, night sweats or weight changes.  Cardiovascular:  No chest pain, dyspnea on exertion, edema, orthopnea, palpitations, paroxysmal nocturnal dyspnea. Dermatological: No rash, lesions/masses Respiratory: No cough, dyspnea Urologic: No hematuria, dysuria Abdominal:   No nausea, vomiting, diarrhea, bright red blood per rectum, melena, or hematemesis Neurologic:  No visual changes, wkns, changes in mental status. All other systems reviewed and are otherwise negative except as noted above.  Physical Exam    VS:  BP (!) 136/94   Pulse 70   Ht 5' 10"  (1.778 m)   Wt 255 lb (115.7 kg)   SpO2 94%   BMI 36.59 kg/m  , BMI Body mass index is 36.59 kg/m. GEN: Well nourished, well developed, in no acute distress. HEENT: normal. Neck: Supple, no JVD, carotid bruits, or masses. Cardiac: RRR, no murmurs, rubs, or gallops. No clubbing, cyanosis, edema.  Radials/DP/PT 2+ and equal bilaterally.  Respiratory:  Respirations regular and unlabored, clear to auscultation bilaterally. GI: Soft, nontender, nondistended, BS + x 4. MS: no deformity or atrophy. Skin: warm and dry, no rash. Neuro:  Strength and sensation are intact. Psych: Normal affect.  Accessory Clinical Findings    Recent Labs: 05/19/2021: BUN 14; Creatinine, Ser 0.95; Hemoglobin 15.7; Platelets 190; Potassium 4.6; Sodium 139 06/06/2021: ALT 61   Recent Lipid Panel    Component Value Date/Time   CHOL 134 06/06/2021 0836   TRIG 141 06/06/2021 0836   HDL 49 06/06/2021 0836   CHOLHDL 2.7 06/06/2021 0836   CHOLHDL 3 06/29/2019 1046   VLDL 30.0 06/29/2019 1046   LDLCALC 61 06/06/2021 0836   LDLDIRECT 147.0 01/16/2016 0908    ECG personally reviewed by me today-none today.  Echocardiogram 05/17/2020  IMPRESSIONS     1. Left ventricular ejection fraction, by estimation, is 45 to 50%. The  left ventricle has mildly decreased function. The left  ventricle  demonstrates global hypokinesis. Left ventricular diastolic parameters are  indeterminate.   2. Right ventricular systolic function is normal. The right ventricular  size is normal.  3. Left atrial size was moderately dilated.   4. The mitral valve has been repaired/replaced. No evidence of mitral  valve regurgitation. No evidence of mitral stenosis. The mean mitral valve  gradient is 4.0 mmHg. There is a prosthetic annuloplasty ring present in  the mitral position.   5. The aortic valve is normal in structure. Aortic valve regurgitation is  not visualized. No aortic stenosis is present.   6. 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 There is  moderate dilatation at the level of the sinuses of Valsalva, measuring 45  mm. There is borderline dilatation of the ascending aorta, measuring 36  mm.   7. The inferior vena cava is normal in size with greater than 50%  respiratory variability, suggesting right atrial pressure of 3 mmHg.   Comparison(s): No significant change from prior study. Prior images  reviewed side by side.   FINDINGS   Left Ventricle: Left ventricular ejection fraction, by estimation, is 45  to 50%. The left ventricle has mildly decreased function. The left  ventricle demonstrates global hypokinesis. The left ventricular internal  cavity size was normal in size. There is   no left ventricular hypertrophy. Left ventricular diastolic parameters  are indeterminate.   Right Ventricle: The right ventricular size is normal. No increase in  right ventricular wall thickness. Right ventricular systolic function is  normal.   Left Atrium: Left atrial size was moderately dilated.   Right Atrium: Right atrial size was normal in size.   Pericardium: There is no evidence of pericardial effusion.   Mitral Valve: The mitral valve has been repaired/replaced. No evidence of  mitral valve regurgitation. There is a prosthetic annuloplasty ring  present in  the mitral position. No evidence of mitral valve stenosis. MV  peak gradient, 10.0 mmHg. The mean  mitral valve gradient is 4.0 mmHg.   Tricuspid Valve: The tricuspid valve is normal in structure. Tricuspid  valve regurgitation is mild . No evidence of tricuspid stenosis.   Aortic Valve: The aortic valve is normal in structure. Aortic valve  regurgitation is not visualized. No aortic stenosis is present.   Pulmonic Valve: The pulmonic valve was normal in structure. Pulmonic valve  regurgitation is not visualized. No evidence of pulmonic stenosis.   Aorta: The aortic root/ascending aorta has been repaired/replaced. There  is moderate dilatation at the level of the sinuses of Valsalva, measuring  45 mm. There is borderline dilatation of the ascending aorta, measuring 36  mm.   Venous: The inferior vena cava is normal in size with greater than 50%  respiratory variability, suggesting right atrial pressure of 3 mmHg.   IAS/Shunts: No atrial level shunt detected by color flow Doppler.   Assessment & Plan   1.  Coronary artery disease-no recent episodes of chest discomfort.  CTA 1/23 showed a calcium score of 117 and mild nonobstructive CAD. Continue amlodipine, carvedilol Heart healthy low-sodium diet-salty 6 given Increase physical activity as tolerated Cbc, BMP  Status post mitral valve repair-no increased DOE or activity intolerance.  Remains physically active.  Echocardiogram 05/17/2020 showed EF of 45-50%, mitral valve gradient of 4 mmHg with prosthetic angioplasty ring present. 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  Echocardiogram 05/17/2020 showed borderline dilation of the ascending aorta measuring 36 mm and moderate dilation aortic root status post repair measuring 45 mm.  There is no change from previous study.  Has maintained good blood pressure control. Repeat  echocardiogram 12/23  Atrial flutter-underwent repeat ablation procedure 1/23.  Denies episodes of accelerated heart rate.  CHA2DS2-VASc score 3 Continue current medical therapy. Heart healthy low-sodium diet-salty 6 given Increase physical activity as tolerated Avoid triggers caffeine, chocolate, EtOH, dehydration etc.  Essential hypertension-BP today 136/84 Continue carvedilol, amlodipine, telmisartan Heart healthy low-sodium diet-salty 6 given Increase physical activity as tolerated  Hyperlipidemia-LDL 61 on 06/06/21 Continue Repatha Heart healthy low-sodium diet Increase physical activity as tolerated Lipids and LFTs  Request call from pharmacy about transitioning from his current insurance to Medicare and cost of Eliquis and Repatha.  Disposition: Follow-up with Dr. Stanford Breed or me in 4 months.   Jossie Ng. Jasmeet Manton NP-C     12/26/2021, 8:43 AM [place] [address] Office [phone] Fax [phone]  Notice: This dictation was prepared with Dragon dictation along with smaller phrase technology. Any transcriptional errors that result from this process are unintentional and may not be corrected upon review.  I spent 14 minutes examining this patient, reviewing medications, and using patient centered shared decision making involving her cardiac care.  Prior to her visit I spent greater than 20 minutes reviewing her past medical history,  medications, and prior cardiac tests.

## 2021-12-26 ENCOUNTER — Encounter: Payer: Self-pay | Admitting: General Practice

## 2021-12-26 ENCOUNTER — Ambulatory Visit (INDEPENDENT_AMBULATORY_CARE_PROVIDER_SITE_OTHER): Payer: Managed Care, Other (non HMO) | Admitting: General Practice

## 2021-12-26 VITALS — BP 136/94 | HR 70 | Ht 70.0 in | Wt 255.0 lb

## 2021-12-26 DIAGNOSIS — I484 Atypical atrial flutter: Secondary | ICD-10-CM

## 2021-12-26 DIAGNOSIS — I2510 Atherosclerotic heart disease of native coronary artery without angina pectoris: Secondary | ICD-10-CM

## 2021-12-26 DIAGNOSIS — E785 Hyperlipidemia, unspecified: Secondary | ICD-10-CM

## 2021-12-26 DIAGNOSIS — Z9889 Other specified postprocedural states: Secondary | ICD-10-CM | POA: Diagnosis not present

## 2021-12-26 DIAGNOSIS — I77810 Thoracic aortic ectasia: Secondary | ICD-10-CM | POA: Diagnosis not present

## 2021-12-26 DIAGNOSIS — I10 Essential (primary) hypertension: Secondary | ICD-10-CM

## 2021-12-26 NOTE — Patient Instructions (Signed)
Medication Instructions:  The current medical regimen is effective;  continue present plan and medications as directed. Please refer to the Current Medication list given to you today.  *If you need a refill on your cardiac medications before your next appointment, please call your pharmacy*  Lab Work: LIPID, LFT, CBC AND BMET  If you have labs (blood work) drawn today and your tests are completely normal, you will receive your results only by: [place] (if you have MyChart) OR A paper copy in the mail If you have any lab test that is abnormal or we need to change your treatment, we will call you to review the results.  Special Instructions PLEASE READ AND FOLLOW SALTY 6-ATTACHED-1,800 mg daily  PLEASE INCREASE PHYSICAL ACTIVITY AS TOLERATED  Follow-Up: Your next appointment:  KEEP SCHEDULED APPOINTMENT In Person with  At [facility], you and your health needs are our priority.  As part of our continuing mission to provide you with exceptional heart care, we have created designated Provider Care Teams.  These Care Teams include your primary Cardiologist (physician) and Advanced Practice Providers (APPs -  Physician Assistants and Nurse Practitioners) who all work together to provide you with the care you need, when you need it.  Important Information About Sugar

## 2021-12-27 LAB — LIPID PANEL
Chol/HDL Ratio: 2.9 ratio (ref 0.0–5.0)
Cholesterol, Total: 152 mg/dL (ref 100–199)
HDL: 53 mg/dL (ref >39)
LDL Chol Calc (NIH): 70 mg/dL (ref 0–99)
Triglycerides: 171 mg/dL — ABNORMAL HIGH (ref 0–149)
VLDL Cholesterol Cal: 29 mg/dL (ref 5–40)

## 2021-12-27 LAB — HEPATIC FUNCTION PANEL
ALT: 92 IU/L — ABNORMAL HIGH (ref 0–44)
AST: 50 IU/L — ABNORMAL HIGH (ref 0–40)
Albumin: 5.1 g/dL — ABNORMAL HIGH (ref 3.9–4.9)
Alkaline Phosphatase: 67 IU/L (ref 44–121)
Bilirubin Total: 1 mg/dL (ref 0.0–1.2)
Bilirubin, Direct: 0.21 mg/dL (ref 0.00–0.40)
Total Protein: 6.8 g/dL (ref 6.0–8.5)

## 2021-12-27 LAB — BASIC METABOLIC PANEL
BUN/Creatinine Ratio: 14 (ref 10–24)
BUN: 15 mg/dL (ref 8–27)
CO2: 24 mmol/L (ref 20–29)
Calcium: 9.6 mg/dL (ref 8.6–10.2)
Chloride: 101 mmol/L (ref 96–106)
Creatinine, Ser: 1.04 mg/dL (ref 0.76–1.27)
Glucose: 111 mg/dL — ABNORMAL HIGH (ref 70–99)
Potassium: 4.6 mmol/L (ref 3.5–5.2)
Sodium: 140 mmol/L (ref 134–144)
eGFR: 78 mL/min/{1.73_m2} (ref >59)

## 2021-12-27 LAB — CBC
Hematocrit: 45 % (ref 37.5–51.0)
Hemoglobin: 15.6 g/dL (ref 13.0–17.7)
MCH: 31.6 pg (ref 26.6–33.0)
MCHC: 34.7 g/dL (ref 31.5–35.7)
MCV: 91 fL (ref 79–97)
Platelets: 176 10*3/uL (ref 150–450)
RBC: 4.93 x10E6/uL (ref 4.14–5.80)
RDW: 12.3 % (ref 11.6–15.4)
WBC: 6.4 10*3/uL (ref 3.4–10.8)

## 2022-02-12 ENCOUNTER — Other Ambulatory Visit: Payer: Self-pay | Admitting: Cardiology

## 2022-02-20 ENCOUNTER — Encounter: Payer: Self-pay | Admitting: Family Medicine

## 2022-02-20 ENCOUNTER — Ambulatory Visit (INDEPENDENT_AMBULATORY_CARE_PROVIDER_SITE_OTHER): Payer: Managed Care, Other (non HMO) | Admitting: Family Medicine

## 2022-02-20 ENCOUNTER — Encounter: Payer: Self-pay | Admitting: Gastroenterology

## 2022-02-20 VITALS — BP 126/80 | HR 82 | Temp 98.0°F | Resp 18 | Ht 70.0 in | Wt 255.8 lb

## 2022-02-20 DIAGNOSIS — Z0000 Encounter for general adult medical examination without abnormal findings: Secondary | ICD-10-CM | POA: Diagnosis not present

## 2022-02-20 DIAGNOSIS — R011 Cardiac murmur, unspecified: Secondary | ICD-10-CM

## 2022-02-20 DIAGNOSIS — I483 Typical atrial flutter: Secondary | ICD-10-CM | POA: Diagnosis not present

## 2022-02-20 DIAGNOSIS — U099 Post covid-19 condition, unspecified: Secondary | ICD-10-CM

## 2022-02-20 DIAGNOSIS — R1013 Epigastric pain: Secondary | ICD-10-CM

## 2022-02-20 DIAGNOSIS — E782 Mixed hyperlipidemia: Secondary | ICD-10-CM

## 2022-02-20 DIAGNOSIS — Z23 Encounter for immunization: Secondary | ICD-10-CM

## 2022-02-20 DIAGNOSIS — Z9889 Other specified postprocedural states: Secondary | ICD-10-CM

## 2022-02-20 DIAGNOSIS — I4819 Other persistent atrial fibrillation: Secondary | ICD-10-CM

## 2022-02-20 DIAGNOSIS — M159 Polyosteoarthritis, unspecified: Secondary | ICD-10-CM

## 2022-02-20 DIAGNOSIS — K7581 Nonalcoholic steatohepatitis (NASH): Secondary | ICD-10-CM

## 2022-02-20 DIAGNOSIS — K449 Diaphragmatic hernia without obstruction or gangrene: Secondary | ICD-10-CM

## 2022-02-20 DIAGNOSIS — K469 Unspecified abdominal hernia without obstruction or gangrene: Secondary | ICD-10-CM

## 2022-02-20 DIAGNOSIS — I10 Essential (primary) hypertension: Secondary | ICD-10-CM

## 2022-02-20 DIAGNOSIS — R438 Other disturbances of smell and taste: Secondary | ICD-10-CM

## 2022-02-20 DIAGNOSIS — E785 Hyperlipidemia, unspecified: Secondary | ICD-10-CM

## 2022-02-20 LAB — CBC WITH DIFFERENTIAL/PLATELET
Basophils Absolute: 0 10*3/uL (ref 0.0–0.1)
Basophils Relative: 0.5 % (ref 0.0–3.0)
Eosinophils Absolute: 0.2 10*3/uL (ref 0.0–0.7)
Eosinophils Relative: 3.5 % (ref 0.0–5.0)
HCT: 43.3 % (ref 39.0–52.0)
Hemoglobin: 14.7 g/dL (ref 13.0–17.0)
Lymphocytes Relative: 27.8 % (ref 12.0–46.0)
Lymphs Abs: 1.4 10*3/uL (ref 0.7–4.0)
MCHC: 34 g/dL (ref 30.0–36.0)
MCV: 92.7 fl (ref 78.0–100.0)
Monocytes Absolute: 0.6 10*3/uL (ref 0.1–1.0)
Monocytes Relative: 11.5 % (ref 3.0–12.0)
Neutro Abs: 2.9 10*3/uL (ref 1.4–7.7)
Neutrophils Relative %: 56.7 % (ref 43.0–77.0)
Platelets: 164 10*3/uL (ref 150.0–400.0)
RBC: 4.67 Mil/uL (ref 4.22–5.81)
RDW: 12.4 % (ref 11.5–15.5)
WBC: 5.1 10*3/uL (ref 4.0–10.5)

## 2022-02-20 LAB — PSA: PSA: 1.1 ng/mL (ref 0.10–4.00)

## 2022-02-20 MED ORDER — MELOXICAM 15 MG PO TABS: 15.0000 mg | ORAL_TABLET | Freq: Every day | ORAL | 0 refills | Status: DC

## 2022-02-20 MED ORDER — PANTOPRAZOLE SODIUM 40 MG PO TBEC: 40.0000 mg | DELAYED_RELEASE_TABLET | Freq: Every day | ORAL | 3 refills | Status: DC

## 2022-02-20 NOTE — Assessment & Plan Note (Signed)
ghm utd Check labs  See avs  

## 2022-02-20 NOTE — Assessment & Plan Note (Signed)
Encourage heart healthy diet such as MIND or DASH diet, increase exercise, avoid trans fats, simple carbohydrates and processed foods, consider a krill or fish or flaxseed oil cap daily.  °

## 2022-02-20 NOTE — Assessment & Plan Note (Signed)
Stable

## 2022-02-20 NOTE — Assessment & Plan Note (Signed)
Stable  Per cards

## 2022-02-20 NOTE — Progress Notes (Signed)
Subjective:   By signing my name below, I, [name], attest that this documentation has been prepared under the direction and in the presence of Ann Held, DO  02/20/2022    Patient ID: [name], male    DOB: 10/06/1950, 71 y.o.   MRN: [id number]  Chief Complaint  Patient presents with   Annual Exam    Pt states fasting     HPI Patient is in today for a comprehensive physical exam.  He is requesting to have multiple abdominal hernias checked. He has no pain with palpation but experiences painful heartburn after eating. He occasionally has pain from his hernia while laying down sleeping and finds relief after sitting up right. He take Tums occasionally when he experiences heartburn. He is interested in seeing a GI specialist to manage his symptoms. 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  He is continues complaining of joint pain and requesting refill for 15 mg of Meloxicam.  He is interested in receiving the flu vaccine, the pneumonia vaccine, and the tetanus vaccine. He last had a colonoscopy in December, 2020. Since contracting Covid-19 last year. Patient is having difficulty smelling and tasting. He reports having difficulty smelling bad smells from his re-used plastic water bottles.  He denies having any fever, new moles, congestion, sinus pain, sore throat, chest pain, palpitations, cough, shortness of breath, wheezing, nausea, vomiting, diarrhea, constipation, dysuria, frequency, abdominal pain, hematuria, new muscle pain, new joint pain, headaches.  He has no changes in family history or no new surgeries to report. He is not currently going to the gym or exercising. He is not currently seeing a dermatologist.    Past Medical History:  Diagnosis Date   Atrial flutter ([place])    s/p ablation   CAD (coronary artery disease)    cath 06/2007 40% LM   CVD (cerebrovascular disease)    59%  right ICA, 49% left ICA; h/oTIA; fu study 8/11 with normal carotids   Dysrhythmia    Gait abnormality    Heart murmur    History of bronchitis    History of colonoscopy    Horseshoe kidney    Hyperlipidemia    MR (mitral regurgitation)    severe; mitral valve repair in march 2009   MVP (mitral valve prolapse)    Osteoarthritis    both knees   Shortness of breath dyspnea    increased exertion     Past Surgical History:  Procedure Laterality Date   A-FLUTTER ABLATION N/A 01/29/2018   Procedure: A-FLUTTER ABLATION;  Surgeon: Constance Haw, MD;  Location: [place] [facility];  Service: Cardiovascular;  Laterality: N/A;   ATRIAL FIBRILLATION ABLATION N/A 01/21/2019   Procedure: ATRIAL FIBRILLATION ABLATION;  Surgeon: Constance Haw, MD;  Location: [place] [facility];  Service: Cardiovascular;  Laterality: N/A;   ATRIAL FIBRILLATION ABLATION N/A 05/30/2021   Procedure: ATRIAL FIBRILLATION ABLATION;  Surgeon: Constance Haw, MD;  Location: [place] [facility];  Service: Cardiovascular;  Laterality: N/A;   BACK SURGERY     secondary to ruptured disc   CARDIAC CATHETERIZATION     CARDIOVERSION N/A 09/23/2017   Procedure: CARDIOVERSION;  Surgeon: Lelon Perla, MD;  Location: [place];  Service: Cardiovascular;  Laterality: N/A;   heart ablation     MITRAL VALVE REPAIR  06/2007   TEE WITHOUT CARDIOVERSION N/A 09/23/2017   Procedure: TRANSESOPHAGEAL ECHOCARDIOGRAM (TEE);  Surgeon: Stanford Breed,  Denice Bors, MD;  Location: [facility];  Service: Cardiovascular;  Laterality: N/A;   [place] Bilateral 03/09/2015   Procedure: TOTAL KNEE BILATERAL;  Surgeon: Gaynelle Arabian, MD;  Location: [facility];  Service: Orthopedics;  Laterality: Bilateral;    Family History  Problem Relation Age of Onset   Liver disease Father        cancer   Hyperlipidemia Father    Liver cancer Father    Diabetes Other    Colitis Daughter    Crohn's disease Daughter     Colon cancer Neg Hx    Esophageal cancer Neg Hx    Rectal cancer Neg Hx    Stomach cancer Neg Hx     Social History   Socioeconomic History   Marital status: Married    Spouse name: Not on file   Number of children: Not on file   Years of education: Not on file   Highest education level: Not on file  Occupational History   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    Comment: works from home  Tobacco Use   Smoking status: Never   Smokeless tobacco: Never  Scientific laboratory technician Use: Never used  Substance and Sexual Activity   Alcohol use: Not Currently    Comment: occasional 1-2 wine drinks a month   Drug use: No   Sexual activity: Yes    Partners: Female  Other Topics Concern   Not on file  Social History Narrative   Exercise- no   Social Determinants of Health   Financial Resource Strain: Not on file  Food Insecurity: Not on file  Transportation Needs: Not on file  Physical Activity: Not on file  Stress: Not on file  Social Connections: Not on file  Intimate Partner Violence: Not on file    Outpatient Medications Prior to Visit  Medication Sig Dispense Refill   amLODipine (NORVASC) 5 MG tablet TAKE 1 TABLET BY MOUTH EVERY DAY 90 tablet 3   carvedilol (COREG) 12.5 MG tablet TAKE 1 TABLET BY MOUTH TWICE A DAY 180 tablet 4   ELIQUIS 5 MG TABS tablet TAKE 1 TABLET BY MOUTH TWICE A DAY *NOT COVERED BY INSURANCE* 180 tablet 1   Menthol, Topical Analgesic, (ICY HOT EX) Apply 1 application topically daily as needed (muscle pain).     Multiple Vitamins-Minerals (CENTRUM SILVER PO) Take 1 tablet by mouth daily.     REPATHA SURECLICK 597 MG/ML SOAJ INJECT 140 MG INTO THE SKIN EVERY 14 (FOURTEEN) DAYS. 6 mL 3   Saw Palmetto 500 MG CAPS Take 500 mg by mouth every other day. 585 mg     sodium chloride (OCEAN) 0.65 % SOLN nasal spray Place 1 spray into both nostrils at bedtime as needed (nasal cleansing).     telmisartan (MICARDIS) 80 MG tablet TAKE 1 TABLET BY MOUTH EVERY DAY 90 tablet 3    Vitamin E 180 MG (400 UNIT) CAPS Take 400 Units by mouth daily.     amoxicillin (AMOXIL) 500 MG capsule Take 2,000 mg by mouth See admin instructions. Take 2000 mg by mouth 1 hour dental procedure (Patient not taking: Reported on 02/20/2022)     No facility-administered medications prior to visit.    Allergies  Allergen Reactions   Praluent [Alirocumab] Shortness Of Breath   Statins Other (See Comments)    elevated liver function    Codeine Nausea Only   Tape Rash    Review of Systems  Constitutional:  Negative for fever and malaise/fatigue.  HENT:  Negative for congestion and sinus pain.   Eyes:  Negative for blurred vision.  Respiratory:  Negative for cough, shortness of breath and wheezing.   Cardiovascular:  Negative for chest pain, palpitations and leg swelling.  Gastrointestinal:  Negative for abdominal pain, blood in stool, constipation, diarrhea, nausea and vomiting.       (+)multiple abdominal hernia  Genitourinary:  Negative for dysuria, frequency and hematuria.  Musculoskeletal:  Negative for falls, joint pain and myalgias.  Skin:  Negative for rash.       (-) new moles  Neurological:  Negative for dizziness, loss of consciousness and headaches.  Endo/Heme/Allergies:  Negative for environmental allergies.  Psychiatric/Behavioral:  Negative for depression. The patient is not nervous/anxious.        Objective:    Physical Exam Vitals and nursing note reviewed.  Constitutional:      General: He is not in acute distress.    Appearance: Normal appearance. He is well-developed. He is not diaphoretic.  HENT:     Head: Normocephalic and atraumatic.     Right Ear: Tympanic membrane, ear canal and external ear normal.     Left Ear: Tympanic membrane, ear canal and external ear normal.     Nose: Nose normal.     Mouth/Throat:     Pharynx: No oropharyngeal exudate.  Eyes:     General:        Right eye: No discharge.        Left eye: No discharge.     Extraocular  Movements: Extraocular movements intact.     Conjunctiva/sclera: Conjunctivae normal.     Pupils: Pupils are equal, round, and reactive to light.  Neck:     Thyroid: No thyromegaly.     Vascular: No carotid bruit or JVD.  Cardiovascular:     Rate and Rhythm: Normal rate and regular rhythm.     Heart sounds: Normal heart sounds. No murmur heard.    No friction rub. No gallop.  Pulmonary:     Effort: Pulmonary effort is normal. No respiratory distress.     Breath sounds: Normal breath sounds. No wheezing or rales.  Chest:     Chest wall: No tenderness.  Abdominal:     General: Bowel sounds are normal. There is no distension.     Palpations: Abdomen is soft. There is no mass.     Tenderness: There is no abdominal tenderness. There is no guarding or rebound.     Hernia: A hernia (2 ventral hernias on upper and lower abdomen) is present. Hernia is present in the ventral area.     Comments: Pt with 2 hernia mid abd and low mid ab  Non tender, reducible   Musculoskeletal:        General: No tenderness. Normal range of motion.     Cervical back: Normal range of motion and neck supple. No rigidity or tenderness.  Lymphadenopathy:     Cervical: No cervical adenopathy.  Skin:    General: Skin is warm and dry.     Coloration: Skin is not pale.     Findings: No erythema or rash.  Neurological:     Mental Status: He is alert and oriented to person, place, and time.     Motor: No abnormal muscle tone.     Deep Tendon Reflexes: Reflexes are normal and symmetric. Reflexes normal.  Psychiatric:        Behavior: Behavior normal.  Thought Content: Thought content normal.        Judgment: Judgment normal.     BP 126/80 (BP Location: Left Arm, Patient Position: Sitting, Cuff Size: Large)   Pulse 82   Temp 98 F (36.7 C) (Oral)   Resp 18   Ht 5' 10" (1.778 m)   Wt 255 lb 12.8 oz (116 kg)   SpO2 96%   BMI 36.70 kg/m  Wt Readings from Last 3 Encounters:  02/20/22 255 lb 12.8 oz (116  kg)  12/26/21 255 lb (115.7 kg)  09/05/21 252 lb (114.3 kg)    Diabetic Foot Exam - Simple   No data filed    Lab Results  Component Value Date   WBC 6.4 12/26/2021   HGB 15.6 12/26/2021   HCT 45.0 12/26/2021   PLT 176 12/26/2021   GLUCOSE 111 (H) 12/26/2021   CHOL 152 12/26/2021   TRIG 171 (H) 12/26/2021   HDL 53 12/26/2021   LDLDIRECT 147.0 01/16/2016   LDLCALC 70 12/26/2021   ALT 92 (H) 12/26/2021   AST 50 (H) 12/26/2021   NA 140 12/26/2021   K 4.6 12/26/2021   CL 101 12/26/2021   CREATININE 1.04 12/26/2021   BUN 15 12/26/2021   CO2 24 12/26/2021   TSH 2.040 12/12/2017   PSA 1.24 06/29/2019   INR 2.34 (H) 03/23/2015   HGBA1C 6.1 10/01/2016    Lab Results  Component Value Date   TSH 2.040 12/12/2017   Lab Results  Component Value Date   WBC 6.4 12/26/2021   HGB 15.6 12/26/2021   HCT 45.0 12/26/2021   MCV 91 12/26/2021   PLT 176 12/26/2021   Lab Results  Component Value Date   NA 140 12/26/2021   K 4.6 12/26/2021   CO2 24 12/26/2021   GLUCOSE 111 (H) 12/26/2021   BUN 15 12/26/2021   CREATININE 1.04 12/26/2021   BILITOT 1.0 12/26/2021   ALKPHOS 67 12/26/2021   AST 50 (H) 12/26/2021   ALT 92 (H) 12/26/2021   PROT 6.8 12/26/2021   ALBUMIN 5.1 (H) 12/26/2021   CALCIUM 9.6 12/26/2021   ANIONGAP 13 01/29/2018   EGFR 78 12/26/2021   GFR 78.32 06/29/2019   Lab Results  Component Value Date   CHOL 152 12/26/2021   Lab Results  Component Value Date   HDL 53 12/26/2021   Lab Results  Component Value Date   LDLCALC 70 12/26/2021   Lab Results  Component Value Date   TRIG 171 (H) 12/26/2021   Lab Results  Component Value Date   CHOLHDL 2.9 12/26/2021   Lab Results  Component Value Date   HGBA1C 6.1 10/01/2016       Assessment & Plan:   Problem List Items Addressed This Visit       Unprioritized   Atrial flutter ([place])   Relevant Orders   CBC with Differential/Platelet   Comprehensive metabolic panel   Lipid panel   PSA    SYSTOLIC MURMUR    Stable       S/P mitral valve repair    Stable  Per cards       Preventative health care - Primary    ghm utd  Check labs  See avs       Relevant Orders   CBC with Differential/Platelet   Comprehensive metabolic panel   Lipid panel   PSA   Persistent atrial fibrillation [facility]    Per cardiology      Hypertension    Well controlled,  no changes to meds. Encouraged heart healthy diet such as the DASH diet and exercise as tolerated.       Relevant Orders   CBC with Differential/Platelet   Comprehensive metabolic panel   Lipid panel   PSA   Hyperlipidemia LDL goal <100    Encourage heart healthy diet such as MIND or DASH diet, increase exercise, avoid trans fats, simple carbohydrates and processed foods, consider a krill or fish or flaxseed oil cap daily.       Other Visit Diagnoses     Mixed hyperlipidemia       Relevant Orders   CBC with Differential/Platelet   Comprehensive metabolic panel   Lipid panel   PSA   Dyspepsia       Relevant Medications   pantoprazole (PROTONIX) 40 MG tablet   Other Relevant Orders   Ambulatory referral to Gastroenterology   Hiatal hernia       Relevant Medications   pantoprazole (PROTONIX) 40 MG tablet   Other Relevant Orders   Ambulatory referral to Gastroenterology   Hernia of abdominal cavity       Relevant Orders   Ambulatory referral to General Surgery   NASH (nonalcoholic steatohepatitis)       Relevant Orders   Ambulatory referral to Gastroenterology   Long COVID       Relevant Orders   Ambulatory referral to Neurology   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       Relevant Orders   Ambulatory referral to Neurology   Primary osteoarthritis involving multiple joints       Relevant Medications   meloxicam (MOBIC) 15 MG tablet   Need for influenza vaccination       Relevant Orders   Flu Vaccine QUAD High Dose(Fluad) (Completed)   Need for pneumococcal vaccination       Relevant Orders    Pneumococcal conjugate vaccine 20-valent (Prevnar 20) (Completed)   Morbid obesity ([place])       Relevant Orders   Amb Ref to Medical Weight Management        Meds ordered this encounter  Medications   pantoprazole (PROTONIX) 40 MG tablet    Sig: Take 1 tablet (40 mg total) by mouth daily.    Dispense:  30 tablet    Refill:  3   meloxicam (MOBIC) 15 MG tablet    Sig: Take 1 tablet (15 mg total) by mouth daily.    Dispense:  30 tablet    Refill:  0    I, Ann Held, DO, personally preformed the services described in this documentation.  All medical record entries made by the scribe were at my direction and in my presence.  I have reviewed the chart and discharge instructions (if applicable) and agree that the record reflects my personal performance and is accurate and complete. 02/20/2022   I,[name],acting as a scribe for Ann Held, DO.,have documented all relevant documentation on the behalf of Ann Held, DO,as directed by  Ann Held, DO while in the presence of Ann Held, DO.   Ann Held, DO

## 2022-02-20 NOTE — Patient Instructions (Signed)
Preventive Care 76 Years and Older, Male Preventive care refers to lifestyle choices and visits with your health care provider that can promote health and wellness. Preventive care visits are also called wellness exams. What can I expect for my preventive care visit? Counseling During your preventive care visit, your health care provider may ask about your: Medical history, including: Past medical problems. Family medical history. History of falls. Current health, including: Emotional well-being. Home life and relationship well-being. Sexual activity. Memory and ability to understand (cognition). Lifestyle, including: Alcohol, nicotine or tobacco, and drug use. Access to firearms. Diet, exercise, and sleep habits. Work and work Statistician. Sunscreen use. Safety issues such as seatbelt and bike helmet use. Physical exam Your health care provider will check your: Height and weight. These may be used to calculate your BMI (body mass index). BMI is a measurement that tells if you are at a healthy weight. Waist circumference. This measures the distance around your waistline. This measurement also tells if you are at a healthy weight and may help predict your risk of certain diseases, such as type 2 diabetes and high blood pressure. Heart rate and blood pressure. Body temperature. Skin for abnormal spots. What immunizations do I need?  Vaccines are usually given at various ages, according to a schedule. Your health care provider will recommend vaccines for you based on your age, medical history, and lifestyle or other factors, such as travel or where you work. What tests do I need? Screening Your health care provider may recommend screening tests for certain conditions. This may include: Lipid and cholesterol levels. Diabetes screening. This is done by checking your blood sugar (glucose) after you have not eaten for a while (fasting). Hepatitis C test. Hepatitis B test. HIV (human  immunodeficiency virus) test. STI (sexually transmitted infection) testing, if you are at risk. Lung cancer screening. Colorectal cancer screening. Prostate cancer screening. Abdominal aortic aneurysm (AAA) screening. You may need this if you are a current or former smoker. Talk with your health care provider about your test results, treatment options, and if necessary, the need for more tests. Follow these instructions at home: Eating and drinking  Eat a diet that includes fresh fruits and vegetables, whole grains, lean protein, and low-fat dairy products. Limit your intake of foods with high amounts of sugar, saturated fats, and salt. Take vitamin and mineral supplements as recommended by your health care provider. Do not drink alcohol if your health care provider tells you not to drink. If you drink alcohol: Limit how much you have to 0-2 drinks a day. Know how much alcohol is in your drink. In the [place] one drink equals one 12 oz bottle of beer (355 mL), one 5 oz glass of wine (148 mL), or one 1 oz glass of hard liquor (44 mL). Lifestyle Brush your teeth every morning and night with fluoride toothpaste. Floss one time each day. Exercise for at least 30 minutes 5 or more days each week. Do not use any products that contain nicotine or tobacco. These products include cigarettes, chewing tobacco, and vaping devices, such as e-cigarettes. If you need help quitting, ask your health care provider. Do not use drugs. If you are sexually active, practice safe sex. Use a condom or other form of protection to prevent STIs. Take aspirin only as told by your health care provider. Make sure that you understand how much to take and what form to take. Work with your health care provider to find out whether it is safe  and beneficial for you to take aspirin daily. Ask your health care provider if you need to take a cholesterol-lowering medicine (statin). Find healthy ways to manage stress, such  as: Meditation, yoga, or listening to music. Journaling. Talking to a trusted person. Spending time with friends and family. Safety Always wear your seat belt while driving or riding in a vehicle. Do not drive: If you have been drinking alcohol. Do not ride with someone who has been drinking. When you are tired or distracted. While texting. If you have been using any mind-altering substances or drugs. Wear a helmet and other protective equipment during sports activities. If you have firearms in your house, make sure you follow all gun safety procedures. Minimize exposure to UV radiation to reduce your risk of skin cancer. What's next? Visit your health care provider once a year for an annual wellness visit. Ask your health care provider how often you should have your eyes and teeth checked. Stay up to date on all vaccines. This information is not intended to replace advice given to you by your health care provider. Make sure you discuss any questions you have with your health care provider. Document Revised: 11/02/2020 Document Reviewed: 11/02/2020 Elsevier Patient Education  [name].

## 2022-02-20 NOTE — Assessment & Plan Note (Signed)
Per cardiology 

## 2022-02-20 NOTE — Assessment & Plan Note (Signed)
Well controlled, no changes to meds. Encouraged heart healthy diet such as the DASH diet and exercise as tolerated.  °

## 2022-02-21 ENCOUNTER — Other Ambulatory Visit: Payer: Self-pay | Admitting: Cardiology

## 2022-02-21 LAB — COMPREHENSIVE METABOLIC PANEL
ALT: 91 U/L — ABNORMAL HIGH (ref 0–53)
AST: 44 U/L — ABNORMAL HIGH (ref 0–37)
Albumin: 4.6 g/dL (ref 3.5–5.2)
Alkaline Phosphatase: 55 U/L (ref 39–117)
BUN: 17 mg/dL (ref 6–23)
CO2: 25 mEq/L (ref 19–32)
Calcium: 9.6 mg/dL (ref 8.4–10.5)
Chloride: 102 mEq/L (ref 96–112)
Creatinine, Ser: 0.97 mg/dL (ref 0.40–1.50)
GFR: 80.03 mL/min (ref >60.00)
Glucose, Bld: 107 mg/dL — ABNORMAL HIGH (ref 70–99)
Potassium: 4.3 mEq/L (ref 3.5–5.1)
Sodium: 138 mEq/L (ref 135–145)
Total Bilirubin: 1.3 mg/dL — ABNORMAL HIGH (ref 0.2–1.2)
Total Protein: 7 g/dL (ref 6.0–8.3)

## 2022-02-21 LAB — LIPID PANEL
Cholesterol: 126 mg/dL (ref 0–200)
HDL: 48.8 mg/dL (ref >39.00)
LDL Cholesterol: 48 mg/dL (ref 0–99)
NonHDL: 77.37
Total CHOL/HDL Ratio: 3
Triglycerides: 145 mg/dL (ref 0.0–149.0)
VLDL: 29 mg/dL (ref 0.0–40.0)

## 2022-03-02 ENCOUNTER — Other Ambulatory Visit: Payer: Self-pay | Admitting: Cardiology

## 2022-03-15 ENCOUNTER — Encounter: Payer: Self-pay | Admitting: Cardiology

## 2022-03-15 ENCOUNTER — Ambulatory Visit: Payer: Managed Care, Other (non HMO) | Attending: Cardiology | Admitting: Cardiology

## 2022-03-15 VITALS — BP 126/72 | HR 59 | Ht 70.0 in | Wt 247.0 lb

## 2022-03-15 DIAGNOSIS — I484 Atypical atrial flutter: Secondary | ICD-10-CM | POA: Diagnosis not present

## 2022-03-15 DIAGNOSIS — D6869 Other thrombophilia: Secondary | ICD-10-CM

## 2022-03-15 NOTE — Progress Notes (Signed)
Electrophysiology Office Note   Date:  03/15/2022   ID:  [name], DOB 10/17/1950, MRN [id number]  PCP:  Carollee Herter, Alferd Apa, DO  Cardiologist:  Stanford Breed Primary Electrophysiologist:  Joshue Badal Meredith Leeds, MD    No chief complaint on file.     History of Present Illness: [name] is a 71 y.o. male who is being seen today for the evaluation of atrial flutter at the request of Fabian Sharp. Presenting today for electrophysiology evaluation.    He has a history significant for coronary artery disease, atrial flutter post ablation, mitral regurgitation postrepair in 2009.  He had atrial flutter ablation in 2009.  He was seen in April 2019 with atrial flutter and heart rates of 126.  He had ablation for atypical atrial flutter which terminated with ablation from the right superior pulmonary vein and a second episode of atrial flutter with ablation 05/30/2021 with termination across the roof of the left atrium.  Today, denies symptoms of palpitations, chest pain, shortness of breath, orthopnea, PND, lower extremity edema, claudication, dizziness, presyncope, syncope, bleeding, or neurologic sequela. The patient is tolerating medications without difficulties.  He has been doing well.  He has no chest pain or shortness of breath.  Is been able to do all his daily activities.  He is remained in normal rhythm.    Past Medical History:  Diagnosis Date   Atrial flutter ([place])    s/p ablation   CAD (coronary artery disease)    cath 06/2007 40% LM   CVD (cerebrovascular disease)    59% right ICA, 49% left ICA; h/oTIA; fu study 8/11 with normal carotids   Dysrhythmia    Gait abnormality    Heart murmur    History of bronchitis    History of colonoscopy    Horseshoe kidney    Hyperlipidemia    MR (mitral regurgitation)    severe; mitral valve repair in march 2009   MVP (mitral valve prolapse)    Osteoarthritis    both knees   Shortness of breath dyspnea    increased  exertion    Past Surgical History:  Procedure Laterality Date   A-FLUTTER ABLATION N/A 01/29/2018   Procedure: A-FLUTTER ABLATION;  Surgeon: Constance Haw, MD;  Location: [facility];  Service: Cardiovascular;  Laterality: N/A;   ATRIAL FIBRILLATION ABLATION N/A 01/21/2019   Procedure: ATRIAL FIBRILLATION ABLATION;  Surgeon: Constance Haw, MD;  Location: [facility];  Service: Cardiovascular;  Laterality: N/A;   ATRIAL FIBRILLATION ABLATION N/A 05/30/2021   Procedure: ATRIAL FIBRILLATION ABLATION;  Surgeon: Constance Haw, MD;  Location: [facility];  Service: Cardiovascular;  Laterality: N/A;   BACK SURGERY     secondary to ruptured disc   CARDIAC CATHETERIZATION     CARDIOVERSION N/A 09/23/2017   Procedure: CARDIOVERSION;  Surgeon: Lelon Perla, MD;  Location: [facility];  Service: Cardiovascular;  Laterality: N/A;   heart ablation     MITRAL VALVE REPAIR  06/2007   TEE WITHOUT CARDIOVERSION N/A 09/23/2017   Procedure: TRANSESOPHAGEAL ECHOCARDIOGRAM (TEE);  Surgeon: Lelon Perla, MD;  Location: [facility];  Service: Cardiovascular;  Laterality: N/A;   [place] Bilateral 03/09/2015   Procedure: TOTAL KNEE BILATERAL;  Surgeon: Gaynelle Arabian, MD;  Location: [facility];  Service: Orthopedics;  Laterality: Bilateral;     Current Outpatient Medications  Medication Sig Dispense Refill   amLODipine (NORVASC) 5 MG tablet TAKE 1  TABLET BY MOUTH EVERY DAY 90 tablet 3   amoxicillin (AMOXIL) 500 MG capsule Take 2,000 mg by mouth See admin instructions. Take 2000 mg by mouth 1 hour dental procedure     carvedilol (COREG) 12.5 MG tablet Take 1 tablet (12.5 mg total) by mouth 2 (two) times daily. Please keep scheduled appointment for additional refills. 180 tablet 0   ELIQUIS 5 MG TABS tablet TAKE 1 TABLET BY MOUTH TWICE A DAY *NOT COVERED BY INSURANCE* 180 tablet 1   meloxicam (MOBIC) 15 MG tablet Take 1 tablet (15 mg  total) by mouth daily. 30 tablet 0   Menthol, Topical Analgesic, (ICY HOT EX) Apply 1 application topically daily as needed (muscle pain).     Multiple Vitamins-Minerals (CENTRUM SILVER PO) Take 1 tablet by mouth daily.     pantoprazole (PROTONIX) 40 MG tablet Take 1 tablet (40 mg total) by mouth daily. 30 tablet 3   REPATHA SURECLICK 474 MG/ML SOAJ INJECT 140 MG INTO THE SKIN EVERY 14 (FOURTEEN) DAYS. 6 mL 3   Saw Palmetto 500 MG CAPS Take 500 mg by mouth every other day. 585 mg     sodium chloride (OCEAN) 0.65 % SOLN nasal spray Place 1 spray into both nostrils at bedtime as needed (nasal cleansing).     telmisartan (MICARDIS) 80 MG tablet TAKE 1 TABLET BY MOUTH EVERY DAY 90 tablet 3   Vitamin E 180 MG (400 UNIT) CAPS Take 400 Units by mouth daily.     No current facility-administered medications for this visit.    Allergies:   Praluent [alirocumab], Statins, Codeine, and Tape   Social History:  The patient  reports that he has never smoked. He has never used smokeless tobacco. He reports that he does not currently use alcohol. He reports that he does not use drugs.   Family History:  The patient's family history includes Colitis in his daughter; Crohn's disease in his daughter; Diabetes in an other family member; Hyperlipidemia in his father; Liver cancer in his father; Liver disease in his father.   ROS:  Please see the history of present illness.   Otherwise, review of systems is positive for none.   All other systems are reviewed and negative.   PHYSICAL EXAM: VS:  BP 126/72   Pulse (!) 59   Ht 5' 10"  (1.778 m)   Wt 247 lb (112 kg)   SpO2 96%   BMI 35.44 kg/m  , BMI Body mass index is 35.44 kg/m. GEN: Well nourished, well developed, in no acute distress  HEENT: normal  Neck: no JVD, carotid bruits, or masses Cardiac: RRR; no murmurs, rubs, or gallops,no edema  Respiratory:  clear to auscultation bilaterally, normal work of breathing GI: soft, nontender, nondistended, +  BS MS: no deformity or atrophy  Skin: warm and dry Neuro:  Strength and sensation are intact Psych: euthymic mood, full affect  EKG:  EKG is ordered today. Personal review of the ekg ordered shows sinus rhythm  Recent Labs: 02/20/2022: ALT 91; BUN 17; Creatinine, Ser 0.97; Hemoglobin 14.7; Platelets 164.0; Potassium 4.3; Sodium 138    Lipid Panel     Component Value Date/Time   CHOL 126 02/20/2022 1052   CHOL 152 12/26/2021 0921   TRIG 145.0 02/20/2022 1052   HDL 48.80 02/20/2022 1052   HDL 53 12/26/2021 0921   CHOLHDL 3 02/20/2022 1052   VLDL 29.0 02/20/2022 1052   LDLCALC 48 02/20/2022 1052   LDLCALC 70 12/26/2021 0921   LDLDIRECT 147.0  01/16/2016 0908     Wt Readings from Last 3 Encounters:  03/15/22 247 lb (112 kg)  02/20/22 255 lb 12.8 oz (116 kg)  12/26/21 255 lb (115.7 kg)      Other studies Reviewed: Additional studies/ records that were reviewed today include: TEE 06/13/18 Review of the above records today demonstrates:  - Left ventricle: The cavity size was normal. Wall thickness was   normal. Systolic function was normal. The estimated ejection   fraction was in the range of 50% to 55%. The study is not   technically sufficient to allow evaluation of LV diastolic   function. - Mitral valve: Post MV repair ? neo chord no significant residual   MR. - Left atrium: The atrium was severely dilated. - Atrial septum: No defect or patent foramen ovale was identified.  ASSESSMENT AND PLAN:  1.  Atypical atrial flutter: Status post ablation 01/21/2019 with tachycardia terminating with mitral valve line.  Status post repeat ablation 05/30/2021 with termination along the roof of the left atrium.  CHA2DS2-VASc of 3.  Currently on Eliquis 5 mg twice daily.  Remains in sinus rhythm.  No changes.  2.  Hypertension: Currently well controlled  3.  Chronic systolic heart failure: Likely tachycardia mediated.  Plan per primary cardiology.  No obvious volume overload.  4.   Coronary artery disease: Mild without chest pain.  5.  Secondary hypercoagulable state: Currently on Eliquis for atrial flutter as above  6.  Obesity: Lifestyle modification encouraged Body mass index is 35.44 kg/m.   Current medicines are reviewed at length with the patient today.   The patient does not have concerns regarding his medicines.  The following changes were made today: None  Labs/ tests ordered today include:  Orders Placed This Encounter  Procedures   EKG 12-Lead      Disposition:   FU 6 months  Signed, Jerrick Farve Meredith Leeds, MD  03/15/2022 10:51 AM     [facility] [address] [place] [address] [phone] (office) [phone] (fax)

## 2022-03-19 ENCOUNTER — Other Ambulatory Visit: Payer: Self-pay | Admitting: Family Medicine

## 2022-03-19 DIAGNOSIS — M159 Polyosteoarthritis, unspecified: Secondary | ICD-10-CM

## 2022-03-19 NOTE — Telephone Encounter (Signed)
Refill request for meloxicam 72m, Pt is on Eliquis 551mbid, is this okay?

## 2022-03-20 ENCOUNTER — Ambulatory Visit: Payer: Managed Care, Other (non HMO)

## 2022-03-20 ENCOUNTER — Encounter (INDEPENDENT_AMBULATORY_CARE_PROVIDER_SITE_OTHER): Payer: Self-pay

## 2022-03-20 DIAGNOSIS — Z23 Encounter for immunization: Secondary | ICD-10-CM

## 2022-03-20 NOTE — Progress Notes (Signed)
Patient here for Tdap as indicate on his ov note from 02/20/22 per Dr. [name]

## 2022-03-21 ENCOUNTER — Other Ambulatory Visit: Payer: Self-pay | Admitting: Cardiology

## 2022-03-21 DIAGNOSIS — I4891 Unspecified atrial fibrillation: Secondary | ICD-10-CM

## 2022-03-21 DIAGNOSIS — I484 Atypical atrial flutter: Secondary | ICD-10-CM

## 2022-03-21 NOTE — Telephone Encounter (Signed)
Eliquis 43m refill request received. Patient is 71years old, weight-112kg, Crea-0.97 on 02/20/2022, Diagnosis-Aflutter, and last seen by Dr. CCurt Bearson 03/15/2022. Dose is appropriate based on dosing criteria. Will send in refill to requested pharmacy.

## 2022-04-13 ENCOUNTER — Other Ambulatory Visit: Payer: Self-pay | Admitting: Family Medicine

## 2022-04-13 DIAGNOSIS — M159 Polyosteoarthritis, unspecified: Secondary | ICD-10-CM

## 2022-04-16 NOTE — Progress Notes (Signed)
HPI: FU mitral valve repair secondary to severe mitral regurgitation in February 2009. Note preoperative catheterization showed a 40% left main, 30% LAD and 30% right coronary artery. He has also had atrial flutter ablation in March 2009.  Carotid dopplers in August of 2011 were normal. Patient has had elevated LFTs with statins in the past. Nuclear study 10/16 showed EF 49 with normal perfusion. TEE/DCCV of atrial flutter May 2019; images showed ejection fraction 35 to 40%, mildly dilated aortic root, mild mitral stenosis and mitral regurgitation; severe left atrial enlargement.  Echocardiogram December 2021 showed EF 45-50, moderate LAE, s/p MV repair with mean gradient 4 mmHg and no MR, moderate aortic root dilatation (45 mm).  Patient recently found to have recurrent atrial flutter by Dr. Curt Bears November 2022.  Cardiac CTA January 2023 showed calcium score of 117 which is 73rd percentile and mild nonobstructive coronary disease: Normal caliber aorta.  Underwent repeat ablation May 30, 2021.  Since I last saw him, he has mild dyspnea on exertion.  He does not have dyspnea at rest.  No orthopnea, PND, pedal edema, chest pain or syncope.  Current Outpatient Medications  Medication Sig Dispense Refill   amLODipine (NORVASC) 5 MG tablet TAKE 1 TABLET BY MOUTH EVERY DAY 90 tablet 3   amoxicillin (AMOXIL) 500 MG capsule Take 2,000 mg by mouth See admin instructions. Take 2000 mg by mouth 1 hour dental procedure     apixaban (ELIQUIS) 5 MG TABS tablet Take 1 tablet (5 mg total) by mouth 2 (two) times daily. 60 tablet 5   carvedilol (COREG) 12.5 MG tablet Take 1 tablet (12.5 mg total) by mouth 2 (two) times daily. Please keep scheduled appointment for additional refills. 180 tablet 0   meloxicam (MOBIC) 15 MG tablet TAKE 1 TABLET (15 MG TOTAL) BY MOUTH DAILY. 30 tablet 0   Multiple Vitamins-Minerals (CENTRUM SILVER PO) Take 1 tablet by mouth daily.     pantoprazole (PROTONIX) 40 MG tablet Take 1  tablet (40 mg total) by mouth daily. 30 tablet 3   REPATHA SURECLICK 563 MG/ML SOAJ INJECT 140 MG INTO THE SKIN EVERY 14 (FOURTEEN) DAYS. 6 mL 3   Saw Palmetto 500 MG CAPS Take 500 mg by mouth every other day. 585 mg     telmisartan (MICARDIS) 80 MG tablet TAKE 1 TABLET BY MOUTH EVERY DAY 90 tablet 3   Vitamin E 180 MG (400 UNIT) CAPS Take 400 Units by mouth daily.     No current facility-administered medications for this visit.     Past Medical History:  Diagnosis Date   Atrial flutter ([place])    s/p ablation   CAD (coronary artery disease)    cath 06/2007 40% LM   CVD (cerebrovascular disease)    59% right ICA, 49% left ICA; h/oTIA; fu study 8/11 with normal carotids   Dysrhythmia    Gait abnormality    Heart murmur    History of bronchitis    History of colonoscopy    Horseshoe kidney    Hyperlipidemia    MR (mitral regurgitation)    severe; mitral valve repair in march 2009   MVP (mitral valve prolapse)    Osteoarthritis    both knees   Shortness of breath dyspnea    increased exertion     Past Surgical History:  Procedure Laterality Date   A-FLUTTER ABLATION N/A 01/29/2018   Procedure: A-FLUTTER ABLATION;  Surgeon: Constance Haw, MD;  Location: [facility];  Service: Cardiovascular;  Laterality: N/A;   ATRIAL FIBRILLATION ABLATION N/A 01/21/2019   Procedure: ATRIAL FIBRILLATION ABLATION;  Surgeon: Constance Haw, MD;  Location: [facility];  Service: Cardiovascular;  Laterality: N/A;   ATRIAL FIBRILLATION ABLATION N/A 05/30/2021   Procedure: ATRIAL FIBRILLATION ABLATION;  Surgeon: Constance Haw, MD;  Location: [facility];  Service: Cardiovascular;  Laterality: N/A;   BACK SURGERY     secondary to ruptured disc   CARDIAC CATHETERIZATION     CARDIOVERSION N/A 09/23/2017   Procedure: CARDIOVERSION;  Surgeon: Lelon Perla, MD;  Location: [facility];  Service: Cardiovascular;  Laterality: N/A;   heart ablation     MITRAL VALVE  REPAIR  06/2007   TEE WITHOUT CARDIOVERSION N/A 09/23/2017   Procedure: TRANSESOPHAGEAL ECHOCARDIOGRAM (TEE);  Surgeon: Lelon Perla, MD;  Location: [facility];  Service: Cardiovascular;  Laterality: N/A;   [place] Bilateral 03/09/2015   Procedure: TOTAL KNEE BILATERAL;  Surgeon: Gaynelle Arabian, MD;  Location: [facility];  Service: Orthopedics;  Laterality: Bilateral;    Social History   Socioeconomic History   Marital status: Married    Spouse name: Not on file   Number of children: Not on file   Years of education: Not on file   Highest education level: Not on file  Occupational History   Occupation: [employer]: works from home  Tobacco Use   Smoking status: Never   Smokeless tobacco: Never  Scientific laboratory technician Use: Never used  Substance and Sexual Activity   Alcohol use: Not Currently    Comment: occasional 1-2 wine drinks a month   Drug use: No   Sexual activity: Yes    Partners: Female  Other Topics Concern   Not on file  Social History Narrative   Exercise- no   Social Determinants of Health   Financial Resource Strain: Not on file  Food Insecurity: Not on file  Transportation Needs: Not on file  Physical Activity: Not on file  Stress: Not on file  Social Connections: Not on file  Intimate Partner Violence: Not on file    Family History  Problem Relation Age of Onset   Liver disease Father        cancer   Hyperlipidemia Father    Liver cancer Father    Diabetes Other    Colitis Daughter    Crohn's disease Daughter    Colon cancer Neg Hx    Esophageal cancer Neg Hx    Rectal cancer Neg Hx    Stomach cancer Neg Hx     ROS: no fevers or chills, productive cough, hemoptysis, dysphasia, odynophagia, melena, hematochezia, dysuria, hematuria, rash, seizure activity, orthopnea, PND, pedal edema, claudication. Remaining systems are negative.  Physical Exam: Well-developed well-nourished in no acute  distress.  Skin is warm and dry.  HEENT is normal.  Neck is supple.  Chest is clear to auscultation with normal expansion.  Cardiovascular exam is regular rate and rhythm.  Abdominal exam nontender or distended. No masses palpated. Extremities show no edema. neuro grossly intact  A/P  1 status post mitral valve repair-continue SBE prophylaxis.  2 atypical atrial flutter-status post repeat ablation; holding sinus rhythm.  Continue apixaban.  3 coronary artery disease-mild on previous catheterization.  Not on aspirin given need for anticoagulation.  He is intolerant to statins.  4 hyperlipidemia-continue Repatha.  He is intolerant to statins.  5 hypertension-patient's blood pressure  is controlled.  Continue present medical regimen.  6 cardiomyopathy-LV function mildly reduced on prior echocardiogram.  Continue ARB and beta-blocker.  Will repeat study.  7 obesity-we again discussed the importance of weight loss.  Kirk Ruths, MD

## 2022-04-17 ENCOUNTER — Ambulatory Visit: Payer: Managed Care, Other (non HMO) | Admitting: Diagnostic Neuroimaging

## 2022-04-17 ENCOUNTER — Encounter: Payer: Self-pay | Admitting: Diagnostic Neuroimaging

## 2022-04-17 VITALS — BP 134/79 | HR 62 | Ht 70.0 in | Wt 248.0 lb

## 2022-04-17 DIAGNOSIS — R430 Anosmia: Secondary | ICD-10-CM | POA: Diagnosis not present

## 2022-04-17 DIAGNOSIS — U099 Post covid-19 condition, unspecified: Secondary | ICD-10-CM | POA: Diagnosis not present

## 2022-04-17 NOTE — Progress Notes (Signed)
[facility]  PATIENT: [name] DOB: 10-20-1950  REFERRING CLINICIAN: Ann Held, * HISTORY FROM: patient  REASON FOR VISIT: new consult   HISTORICAL  CHIEF COMPLAINT:  Chief Complaint  Patient presents with   New Patient (Initial Visit)    Pt alone, rm 6, 2021 first case of covid. He lost taste/smell. Started to come back. A year later he developed covid again and lost taste and smell again. He has not regained this back. He is able to tast salt,sugar and tangy at times. He is unable to smell if food is going bad. 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  Had [place] in 2021 and lost sense of smell and taste.  This gradually improved over time.  He had COVID infection again in 2022 and again lost sense of smell.  This time his sense of smell has not improved that much.  He is also noticed that he is more sensitive to sounds than before.  No other issues with headaches, vision, speech, arms or legs.  Has noticed that with loss of smell and taste, he tends to eat more to try to get the flavor foods.  This is resulted in some weight gain.   REVIEW OF SYSTEMS: Full 14 system review of systems performed and negative with exception of: as per HPI.  ALLERGIES: Allergies  Allergen Reactions   Praluent [Alirocumab] Shortness Of Breath   Statins Other (See Comments)    elevated liver function    Codeine Nausea Only   Tape Rash    HOME MEDICATIONS: Outpatient Medications Prior to Visit  Medication Sig Dispense Refill   amLODipine (NORVASC) 5 MG tablet TAKE 1 TABLET BY MOUTH EVERY DAY 90 tablet 3   amoxicillin (AMOXIL) 500 MG capsule Take 2,000 mg by mouth See admin instructions. Take 2000 mg by mouth 1 hour dental procedure     apixaban (ELIQUIS) 5 MG TABS tablet Take 1 tablet (5 mg total) by mouth 2 (two) times daily. 60 tablet 5   carvedilol (COREG) 12.5 MG tablet  Take 1 tablet (12.5 mg total) by mouth 2 (two) times daily. Please keep scheduled appointment for additional refills. 180 tablet 0   meloxicam (MOBIC) 15 MG tablet TAKE 1 TABLET (15 MG TOTAL) BY MOUTH DAILY. 30 tablet 0   Multiple Vitamins-Minerals (CENTRUM SILVER PO) Take 1 tablet by mouth daily.     pantoprazole (PROTONIX) 40 MG tablet Take 1 tablet (40 mg total) by mouth daily. 30 tablet 3   REPATHA SURECLICK 552 MG/ML SOAJ INJECT 140 MG INTO THE SKIN EVERY 14 (FOURTEEN) DAYS. 6 mL 3   Saw Palmetto 500 MG CAPS Take 500 mg by mouth every other day. 585 mg     telmisartan (MICARDIS) 80 MG tablet TAKE 1 TABLET BY MOUTH EVERY DAY 90 tablet 3   Vitamin E 180 MG (400 UNIT) CAPS Take 400 Units by mouth daily.     Menthol, Topical Analgesic, (ICY HOT EX) Apply 1 application topically daily as needed (muscle pain).     sodium chloride (OCEAN) 0.65 % SOLN nasal spray Place 1 spray into both nostrils at bedtime as needed (nasal cleansing).     No facility-administered medications prior to visit.    PAST MEDICAL HISTORY: Past Medical History:  Diagnosis Date   Atrial flutter ([place])    s/p ablation   CAD (coronary artery disease)  cath 06/2007 40% LM   CVD (cerebrovascular disease)    59% right ICA, 49% left ICA; h/oTIA; fu study 8/11 with normal carotids   Dysrhythmia    Gait abnormality    Heart murmur    History of bronchitis    History of colonoscopy    Horseshoe kidney    Hyperlipidemia    MR (mitral regurgitation)    severe; mitral valve repair in march 2009   MVP (mitral valve prolapse)    Osteoarthritis    both knees   Shortness of breath dyspnea    increased exertion     PAST SURGICAL HISTORY: Past Surgical History:  Procedure Laterality Date   A-FLUTTER ABLATION N/A 01/29/2018   Procedure: A-FLUTTER ABLATION;  Surgeon: Constance Haw, MD;  Location: [place] [facility];  Service: Cardiovascular;  Laterality: N/A;   ATRIAL FIBRILLATION ABLATION N/A 01/21/2019    Procedure: ATRIAL FIBRILLATION ABLATION;  Surgeon: Constance Haw, MD;  Location: [place] [facility];  Service: Cardiovascular;  Laterality: N/A;   ATRIAL FIBRILLATION ABLATION N/A 05/30/2021   Procedure: ATRIAL FIBRILLATION ABLATION;  Surgeon: Constance Haw, MD;  Location: [place] [facility];  Service: Cardiovascular;  Laterality: N/A;   BACK SURGERY     secondary to ruptured disc   CARDIAC CATHETERIZATION     CARDIOVERSION N/A 09/23/2017   Procedure: CARDIOVERSION;  Surgeon: Lelon Perla, MD;  Location: [place];  Service: Cardiovascular;  Laterality: N/A;   heart ablation     MITRAL VALVE REPAIR  06/2007   TEE WITHOUT CARDIOVERSION N/A 09/23/2017   Procedure: TRANSESOPHAGEAL ECHOCARDIOGRAM (TEE);  Surgeon: Lelon Perla, MD;  Location[facility];  Service: Cardiovascular;  Laterality: N/A;   [place] Bilateral 03/09/2015   Procedure: TOTAL KNEE BILATERAL;  Surgeon: Gaynelle Arabian, MD;  Location: [facility];  Service: Orthopedics;  Laterality: Bilateral;    FAMILY HISTORY: Family History  Problem Relation Age of Onset   Liver disease Father        cancer   Hyperlipidemia Father    Liver cancer Father    Diabetes Other    Colitis Daughter    Crohn's disease Daughter    Colon cancer Neg Hx    Esophageal cancer Neg Hx    Rectal cancer Neg Hx    Stomach cancer Neg Hx     SOCIAL HISTORY: Social History   Socioeconomic History   Marital status: Married    Spouse name: Not on file   Number of children: Not on file   Years of education: Not on file   Highest education level: Not on file  Occupational History   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    Comment: works from home  Tobacco Use   Smoking status: Never   Smokeless tobacco: Never  Scientific laboratory technician Use: Never used  Substance and Sexual Activity   Alcohol use: Not Currently    Comment: occasional 1-2 wine drinks a month   Drug use: No   Sexual activity: Yes     Partners: Female  Other Topics Concern   Not on file  Social History Narrative   Exercise- no   Social Determinants of Health   Financial Resource Strain: Not on file  Food Insecurity: Not on file  Transportation Needs: Not on file  Physical Activity: Not on file  Stress: Not on file  Social Connections: Not on file  Intimate Partner Violence: Not on file  PHYSICAL EXAM  GENERAL EXAM/CONSTITUTIONAL: Vitals:  Vitals:   04/17/22 0932  BP: 134/79  Pulse: 62  Weight: 248 lb (112.5 kg)  Height: 5' 10"  (1.778 m)   Body mass index is 35.58 kg/m. Wt Readings from Last 3 Encounters:  04/17/22 248 lb (112.5 kg)  03/15/22 247 lb (112 kg)  02/20/22 255 lb 12.8 oz (116 kg)   Patient is in no distress; well developed, nourished and groomed; neck is supple  CARDIOVASCULAR: Examination of carotid arteries is normal; no carotid bruits Regular rate and rhythm, no murmurs Examination of peripheral vascular system by observation and palpation is normal  EYES: Ophthalmoscopic exam of optic discs and posterior segments is normal; no papilledema or hemorrhages No results found.  MUSCULOSKELETAL: Gait, strength, tone, movements noted in Neurologic exam below  NEUROLOGIC: MENTAL STATUS:      No data to display         awake, alert, oriented to person, place and time recent and remote memory intact normal attention and concentration language fluent, comprehension intact, naming intact fund of knowledge appropriate  CRANIAL NERVE:  2nd - no papilledema on fundoscopic exam 2nd, 3rd, 4th, 6th - pupils equal and reactive to light, visual fields full to confrontation, extraocular muscles intact, no nystagmus 5th - facial sensation symmetric 7th - facial strength symmetric 8th - hearing intact 9th - palate elevates symmetrically, uvula midline 11th - shoulder shrug symmetric 12th - tongue protrusion midline  MOTOR:  normal bulk and tone, full strength in the BUE,  BLE  SENSORY:  normal and symmetric to light touch, temperature, vibration  COORDINATION:  finger-nose-finger, fine finger movements normal  REFLEXES:  deep tendon reflexes TRACE and symmetric  GAIT/STATION:  narrow based gait     DIAGNOSTIC DATA (LABS, IMAGING, TESTING) - I reviewed patient records, labs, notes, testing and imaging myself where available.  Lab Results  Component Value Date   WBC 5.1 02/20/2022   HGB 14.7 02/20/2022   HCT 43.3 02/20/2022   MCV 92.7 02/20/2022   PLT 164.0 02/20/2022      Component Value Date/Time   NA 138 02/20/2022 1052   NA 140 12/26/2021 0921   K 4.3 02/20/2022 1052   CL 102 02/20/2022 1052   CO2 25 02/20/2022 1052   GLUCOSE 107 (H) 02/20/2022 1052   BUN 17 02/20/2022 1052   BUN 15 12/26/2021 0921   CREATININE 0.97 02/20/2022 1052   CREATININE 0.98 09/17/2014 1524   CALCIUM 9.6 02/20/2022 1052   PROT 7.0 02/20/2022 1052   PROT 6.8 12/26/2021 0921   ALBUMIN 4.6 02/20/2022 1052   ALBUMIN 5.1 (H) 12/26/2021 0921   AST 44 (H) 02/20/2022 1052   ALT 91 (H) 02/20/2022 1052   ALKPHOS 55 02/20/2022 1052   BILITOT 1.3 (H) 02/20/2022 1052   BILITOT 1.0 12/26/2021 0921   GFRNONAA 71 04/21/2020 0839   GFRNONAA 74 02/08/2014 0958   GFRAA 82 04/21/2020 0839   GFRAA 86 02/08/2014 0958   Lab Results  Component Value Date   CHOL 126 02/20/2022   HDL 48.80 02/20/2022   LDLCALC 48 02/20/2022   LDLDIRECT 147.0 01/16/2016   TRIG 145.0 02/20/2022   CHOLHDL 3 02/20/2022   Lab Results  Component Value Date   HGBA1C 6.1 10/01/2016   Lab Results  Component Value Date   GEXBMWUX32 440 12/29/2007   Lab Results  Component Value Date   TSH 2.040 12/12/2017      ASSESSMENT AND PLAN  71 y.o. year old male here with:  Dx:  1. 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  Advised patient to try smell retraining strategies using vials of common items.  Also may try daily Flonase for 1 month to see if this improves symptoms.  OBESITY / WEIGHT MGMT - reviewed nutrition, exercise, sleep and other strategies for weight management  Return for pending if symptoms worsen or fail to improve, return to PCP.  I spent 45 minutes of face-to-face and non-face-to-face time with patient.  This included previsit chart review, lab review, study review, order entry, electronic health record documentation, patient education.     Penni Bombard, MD 70/78/6754, 4:92 PM Certified in Neurology, Neurophysiology and Neuroimaging  [facility] [address] [place], [place] [address] [phone]

## 2022-04-26 ENCOUNTER — Encounter: Payer: Self-pay | Admitting: Cardiology

## 2022-04-26 ENCOUNTER — Ambulatory Visit: Payer: Managed Care, Other (non HMO) | Attending: Cardiology | Admitting: Cardiology

## 2022-04-26 VITALS — BP 128/70 | HR 72 | Ht 70.5 in | Wt 245.0 lb

## 2022-04-26 DIAGNOSIS — Z9889 Other specified postprocedural states: Secondary | ICD-10-CM

## 2022-04-26 DIAGNOSIS — I484 Atypical atrial flutter: Secondary | ICD-10-CM

## 2022-04-26 DIAGNOSIS — I10 Essential (primary) hypertension: Secondary | ICD-10-CM | POA: Diagnosis not present

## 2022-04-26 DIAGNOSIS — I2510 Atherosclerotic heart disease of native coronary artery without angina pectoris: Secondary | ICD-10-CM

## 2022-04-26 DIAGNOSIS — I420 Dilated cardiomyopathy: Secondary | ICD-10-CM

## 2022-04-26 NOTE — Patient Instructions (Signed)
  Testing/Procedures:  Your physician has requested that you have an echocardiogram. Echocardiography is a painless test that uses sound waves to create images of your heart. It provides your doctor with information about the size and shape of your heart and how well your heart's chambers and valves are working. This procedure takes approximately one hour. There are no restrictions for this procedure. Please do NOT wear cologne, perfume, aftershave, or lotions (deodorant is allowed). Please arrive 15 minutes prior to your appointment time. [place], you and your health needs are our priority.  As part of our continuing mission to provide you with exceptional heart care, we have created designated Provider Care Teams.  These Care Teams include your primary Cardiologist (physician) and Advanced Practice Providers (APPs -  Physician Assistants and Nurse Practitioners) who all work together to provide you with the care you need, when you need it.  We recommend signing up for the patient portal called "MyChart".  Sign up information is provided on this After Visit Summary.  MyChart is used to connect with patients for Virtual Visits (Telemedicine).  Patients are able to view lab/test results, encounter notes, upcoming appointments, etc.  Non-urgent messages can be sent to your provider as well.   To learn more about what you can do with MyChart, go to [email or web address].    Your next appointment:   12 month(s)  The format for your next appointment:   In Person  Provider:   Kirk Ruths, MD

## 2022-05-02 ENCOUNTER — Ambulatory Visit: Payer: Managed Care, Other (non HMO) | Admitting: Gastroenterology

## 2022-05-08 ENCOUNTER — Other Ambulatory Visit (HOSPITAL_COMMUNITY): Payer: Managed Care, Other (non HMO)

## 2022-05-13 ENCOUNTER — Other Ambulatory Visit: Payer: Self-pay | Admitting: Family Medicine

## 2022-05-13 DIAGNOSIS — M159 Polyosteoarthritis, unspecified: Secondary | ICD-10-CM

## 2022-05-15 ENCOUNTER — Other Ambulatory Visit: Payer: Self-pay | Admitting: Cardiology

## 2022-05-18 ENCOUNTER — Ambulatory Visit (HOSPITAL_COMMUNITY): Payer: Managed Care, Other (non HMO) | Attending: Cardiology

## 2022-05-18 DIAGNOSIS — I420 Dilated cardiomyopathy: Secondary | ICD-10-CM | POA: Insufficient documentation

## 2022-05-18 LAB — ECHOCARDIOGRAM COMPLETE
Area-P 1/2: 2.32 cm2
Calc EF: 44.4 %
MV M vel: 4.77 m/s
MV Peak grad: 91 mmHg
MV VTI: 2.41 cm2
S' Lateral: 3.4 cm
Single Plane A2C EF: 48.4 %
Single Plane A4C EF: 39.5 %

## 2022-06-20 ENCOUNTER — Other Ambulatory Visit (HOSPITAL_COMMUNITY): Payer: Self-pay

## 2022-06-21 ENCOUNTER — Telehealth: Payer: Self-pay

## 2022-06-21 NOTE — Telephone Encounter (Signed)
Pharmacy Patient Advocate Encounter  Prior Authorization for REPATHA 140 MG/ML INJ has been approved.    Effective dates: 06/21/22 through 06/22/23   Received notification from [place] that prior authorization for REPATHA 140 MG/ML INJ is needed.    PA submitted on 06/20/22 Key [id number] Status is pending  Karie Soda, [place] Patient Advocate Specialist Direct Number: [phone] Fax: [phone]

## 2022-06-26 ENCOUNTER — Telehealth: Payer: Self-pay

## 2022-06-26 ENCOUNTER — Encounter: Payer: Self-pay | Admitting: Gastroenterology

## 2022-06-26 ENCOUNTER — Ambulatory Visit: Payer: PPO | Admitting: Gastroenterology

## 2022-06-26 ENCOUNTER — Other Ambulatory Visit (INDEPENDENT_AMBULATORY_CARE_PROVIDER_SITE_OTHER): Payer: PPO

## 2022-06-26 VITALS — BP 122/78 | HR 95 | Ht 70.0 in | Wt 246.0 lb

## 2022-06-26 DIAGNOSIS — R945 Abnormal results of liver function studies: Secondary | ICD-10-CM | POA: Diagnosis not present

## 2022-06-26 DIAGNOSIS — R140 Abdominal distension (gaseous): Secondary | ICD-10-CM

## 2022-06-26 DIAGNOSIS — G8929 Other chronic pain: Secondary | ICD-10-CM

## 2022-06-26 DIAGNOSIS — K7581 Nonalcoholic steatohepatitis (NASH): Secondary | ICD-10-CM

## 2022-06-26 DIAGNOSIS — R1013 Epigastric pain: Secondary | ICD-10-CM | POA: Diagnosis not present

## 2022-06-26 LAB — CBC WITH DIFFERENTIAL/PLATELET
Basophils Absolute: 0 10*3/uL (ref 0.0–0.1)
Basophils Relative: 0.6 % (ref 0.0–3.0)
Eosinophils Absolute: 0.3 10*3/uL (ref 0.0–0.7)
Eosinophils Relative: 4.2 % (ref 0.0–5.0)
HCT: 43.9 % (ref 39.0–52.0)
Hemoglobin: 15.1 g/dL (ref 13.0–17.0)
Lymphocytes Relative: 23.7 % (ref 12.0–46.0)
Lymphs Abs: 1.7 10*3/uL (ref 0.7–4.0)
MCHC: 34.4 g/dL (ref 30.0–36.0)
MCV: 92.1 fl (ref 78.0–100.0)
Monocytes Absolute: 0.8 10*3/uL (ref 0.1–1.0)
Monocytes Relative: 10.6 % (ref 3.0–12.0)
Neutro Abs: 4.5 10*3/uL (ref 1.4–7.7)
Neutrophils Relative %: 60.9 % (ref 43.0–77.0)
Platelets: 201 10*3/uL (ref 150.0–400.0)
RBC: 4.77 Mil/uL (ref 4.22–5.81)
RDW: 12.8 % (ref 11.5–15.5)
WBC: 7.4 10*3/uL (ref 4.0–10.5)

## 2022-06-26 LAB — HEPATIC FUNCTION PANEL
ALT: 64 U/L — ABNORMAL HIGH (ref 0–53)
AST: 35 U/L (ref 0–37)
Albumin: 4.9 g/dL (ref 3.5–5.2)
Alkaline Phosphatase: 51 U/L (ref 39–117)
Bilirubin, Direct: 0.2 mg/dL (ref 0.0–0.3)
Total Bilirubin: 0.9 mg/dL (ref 0.2–1.2)
Total Protein: 7.5 g/dL (ref 6.0–8.3)

## 2022-06-26 LAB — IBC + FERRITIN
Ferritin: 237.5 ng/mL (ref 22.0–322.0)
Iron: 130 ug/dL (ref 42–165)
Saturation Ratios: 33.5 % (ref 20.0–50.0)
TIBC: 387.8 ug/dL (ref 250.0–450.0)
Transferrin: 277 mg/dL (ref 212.0–360.0)

## 2022-06-26 LAB — PROTIME-INR
INR: 1.3 ratio — ABNORMAL HIGH (ref 0.8–1.0)
Prothrombin Time: 14.3 s — ABNORMAL HIGH (ref 9.6–13.1)

## 2022-06-26 LAB — SEDIMENTATION RATE: Sed Rate: 8 mm/hr (ref 0–20)

## 2022-06-26 NOTE — Progress Notes (Signed)
[name]    JQ:[id number]    1950/09/25  Primary Care Physician:Lowne Cheri Rous Alferd Apa, DO  Referring Physician: Carollee Herter, Alferd Apa, DO [address] [place],  [place] [address]   Chief complaint:  Fatty liver, GERD  HPI:  72 year old very pleasant gentleman here with complaints of upper abdominal discomfort for past few months. He is experiencing bloating and excessive belching.  Was noted to have LFT abnormality with elevated AST and ALT.  He is trying to change his diet and exercise  Denies any rectal bleeding, nausea, vomiting, unintentional weight loss.    Row Labels Latest Ref Rng & Units 06/26/2022    2:46 PM 02/20/2022   10:52 AM 12/26/2021    9:21 AM  Hepatic Function   Section Header. No data exists in this row.      Total Protein   6.0 - 8.3 g/dL 7.5  7.0  6.8   Albumin   3.5 - 5.2 g/dL 4.9  4.6  5.1   AST   0 - 37 U/L 35  44  50   ALT   0 - 53 U/L 64  91  92   Alk Phosphatase   39 - 117 U/L 51  55  67   Total Bilirubin   0.2 - 1.2 mg/dL 0.9  1.3  1.0   Bilirubin, Direct   0.0 - 0.3 mg/dL 0.2   0.21     Colonoscopy 05/06/2018 Impression:        - One 5 mm polyp in the sigmoid colon, removed with                            a cold snare. Resected and retrieved.                           - Two 1 to 2 mm polyps in the transverse colon,                            removed with a cold biopsy forceps. Resected and                            retrieved.                           - Diverticulosis in the sigmoid colon and in the                            descending colon.                           - Non-bleeding internal hemorrhoids.   Outpatient Encounter Medications as of 06/26/2022  Medication Sig   amLODipine (NORVASC) 5 MG tablet TAKE 1 TABLET BY MOUTH EVERY DAY   amoxicillin (AMOXIL) 500 MG capsule Take 2,000 mg by mouth See admin instructions. Take 2000 mg by mouth 1 hour dental procedure   apixaban (ELIQUIS) 5 MG TABS tablet Take 1  tablet (5 mg total) by mouth 2 (two) times daily.   carvedilol (COREG) 12.5 MG tablet TAKE 1 TABLET BY MOUTH 2 TIMES DAILY. PLEASE KEEP SCHEDULED APPOINTMENT FOR ADDITIONAL REFILLS.  meloxicam (MOBIC) 15 MG tablet TAKE 1 TABLET (15 MG TOTAL) BY MOUTH DAILY.   Multiple Vitamins-Minerals (CENTRUM SILVER PO) Take 1 tablet by mouth daily.   REPATHA SURECLICK XX123456 MG/ML SOAJ INJECT 140 MG INTO THE SKIN EVERY 14 (FOURTEEN) DAYS.   Saw Palmetto 500 MG CAPS Take 500 mg by mouth every other day. 585 mg   telmisartan (MICARDIS) 80 MG tablet TAKE 1 TABLET BY MOUTH EVERY DAY   Vitamin E 180 MG (400 UNIT) CAPS Take 400 Units by mouth daily.   pantoprazole (PROTONIX) 40 MG tablet Take 1 tablet (40 mg total) by mouth daily. (Patient not taking: Reported on 06/26/2022)   No facility-administered encounter medications on file as of 06/26/2022.    Allergies as of 06/26/2022 - Review Complete 06/26/2022  Allergen Reaction Noted   Praluent [alirocumab] Shortness Of Breath 05/30/2020   Statins Other (See Comments) 02/08/2014   Codeine Nausea Only    Tape Rash 03/17/2012    Past Medical History:  Diagnosis Date   Atrial flutter ([facility])    s/p ablation   CAD (coronary artery disease)    cath 06/2007 40% LM   CVD (cerebrovascular disease)    59% right ICA, 49% left ICA; h/oTIA; fu study 8/11 with normal carotids   Dysrhythmia    Gait abnormality    Heart murmur    History of bronchitis    History of colonoscopy    Horseshoe kidney    Hyperlipidemia    MR (mitral regurgitation)    severe; mitral valve repair in march 2009   MVP (mitral valve prolapse)    Osteoarthritis    both knees   Shortness of breath dyspnea    increased exertion     Past Surgical History:  Procedure Laterality Date   A-FLUTTER ABLATION N/A 01/29/2018   Procedure: A-FLUTTER ABLATION;  Surgeon: Constance Haw, MD;  Location: [place] [facility];  Service: Cardiovascular;  Laterality: N/A;   ATRIAL FIBRILLATION ABLATION N/A  01/21/2019   Procedure: ATRIAL FIBRILLATION ABLATION;  Surgeon: Constance Haw, MD;  Location: [place] [facility];  Service: Cardiovascular;  Laterality: N/A;   ATRIAL FIBRILLATION ABLATION N/A 05/30/2021   Procedure: ATRIAL FIBRILLATION ABLATION;  Surgeon: Constance Haw, MD;  Location: [place] [facility];  Service: Cardiovascular;  Laterality: N/A;   BACK SURGERY     secondary to ruptured disc   CARDIAC CATHETERIZATION     CARDIOVERSION N/A 09/23/2017   Procedure: CARDIOVERSION;  Surgeon: Lelon Perla, MD;  Location: [place];  Service: Cardiovascular;  Laterality: N/A;   heart ablation     MITRAL VALVE REPAIR  06/2007   TEE WITHOUT CARDIOVERSION N/A 09/23/2017   Procedure: TRANSESOPHAGEAL ECHOCARDIOGRAM (TEE);  Surgeon: Lelon Perla, MD;  Location: [facility];  Service: Cardiovascular;  Laterality: N/A;   [place] Bilateral 03/09/2015   Procedure: TOTAL KNEE BILATERAL;  Surgeon: Gaynelle Arabian, MD;  Location: [facility];  Service: Orthopedics;  Laterality: Bilateral;    Family History  Problem Relation Age of Onset   Liver disease Father        cancer   Hyperlipidemia Father    Liver cancer Father    Diabetes Other    Colitis Daughter    Crohn's disease Daughter    Colon cancer Neg Hx    Esophageal cancer Neg Hx    Rectal cancer Neg Hx    Stomach cancer Neg Hx     Social History  Socioeconomic History   Marital status: Married    Spouse name: Not on file   Number of children: 2   Years of education: Not on file   Highest education level: Not on file  Occupational History   Occupation: [employer]: works from home   Occupation: retired  Tobacco Use   Smoking status: Never   Smokeless tobacco: Never  Scientific laboratory technician Use: Never used  Substance and Sexual Activity   Alcohol use: Not Currently    Comment: occasional 1-2 wine drinks a month   Drug use: No   Sexual activity: Yes    Partners:  Female  Other Topics Concern   Not on file  Social History Narrative   Exercise- no   Social Determinants of Health   Financial Resource Strain: Not on file  Food Insecurity: Not on file  Transportation Needs: Not on file  Physical Activity: Not on file  Stress: Not on file  Social Connections: Not on file  Intimate Partner Violence: Not on file      Review of systems: All other review of systems negative except as mentioned in the HPI.   Physical Exam: Vitals:   06/26/22 1336  BP: 122/78  Pulse: 95  SpO2: 98%   Body mass index is 35.3 kg/m. 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 The patient agreed with the plan and demonstrated an understanding of the instructions.  Damaris Hippo , MD    CC: Carollee Herter, Alferd Apa, *

## 2022-06-26 NOTE — Telephone Encounter (Signed)
[facility] Pre-operative Risk Assessment     Request for surgical clearance:     Endoscopy Procedure  What type of surgery is being performed?     EGD  When is this surgery scheduled?     07-04-2022  What type of clearance is required ?   Pharmacy  Are there any medications that need to be held prior to surgery and how long? Yes, ELIQUIS 2 days  Practice name and name of physician performing surgery?      [facility]  What is your office phone and fax number?      Phone- [phone]  [phone]  Anesthesia type (None, local, MAC, general) ?       MAC

## 2022-06-26 NOTE — Patient Instructions (Addendum)
_______________________________________________________  If your blood pressure at your visit was 140/90 or greater, please contact your primary care physician to follow up on this.  _______________________________________________________  If you are age 70 or older, your body mass index should be between 23-30. Your Body mass index is 35.3 kg/m. If this is out of the aforementioned range listed, please consider follow up with your Primary Care Provider.  If you are age 50 or younger, your body mass index should be between 19-25. Your Body mass index is 35.3 kg/m. If this is out of the aformentioned range listed, please consider follow up with your Primary Care Provider.   ________________________________________________________  The [place] [facility] providers would like to encourage you to use [facility] to communicate with providers for non-urgent requests or questions.  Due to long hold times on the telephone, sending your provider a message by [facility] may be a faster and more efficient way to get a response.  Please allow 48 business hours for a response.  Please remember that this is for non-urgent requests.  _______________________________________________________  Dennis Bast have been scheduled for an endoscopy. Please follow written instructions given to you at your visit today. If you use inhalers (even only as needed), please bring them with you on the day of your procedure.   You will be contacted by our office prior to your procedure for directions on holding your ELIQUIS.  If you do not hear from our office 1 week prior to your scheduled procedure, please call [phone] to discuss.    Your provider has requested that you go to the basement level for lab work before leaving today. Press "B" on the elevator. The lab is located at the first door on the left as you exit the elevator.  Due to recent changes in healthcare laws, you may see the results of your imaging and laboratory studies on  MyChart before your provider has had a chance to review them.  We understand that in some cases there may be results that are confusing or concerning to you. Not all laboratory results come back in the same time frame and the provider may be waiting for multiple results in order to interpret others.  Please give [place] 48 hours in order for your provider to thoroughly review all the results before contacting the office for clarification of your results.   It was a pleasure to see you today!  Thank you for trusting me with your gastrointestinal care!

## 2022-06-27 NOTE — Telephone Encounter (Signed)
Patient with diagnosis of atrial fibrillation on Eliquis for anticoagulation.    Procedure: EGD Date of procedure: 07/04/22   CHA2DS2-VASc Score = 4   This indicates a 4.8% annual risk of stroke. The patient's score is based upon: CHF History: 1 HTN History: 1 Diabetes History: 0 Stroke History: 0 Vascular Disease History: 1 Age Score: 1 Gender Score: 0   Chart notes some carotid stenosis with h/o TIA (at least 10 years ago)  CrCl 90 Platelet count 201  Per office protocol, patient can hold Eliquis for 2 days prior to procedure.   Patient will not need bridging with Lovenox (enoxaparin) around procedure.  **This guidance is not considered finalized until pre-operative APP has relayed final recommendations.**

## 2022-06-27 NOTE — Telephone Encounter (Signed)
   Patient Name: [name]  DOB: 1951-01-26 MRN: [id number]  Primary Cardiologist: Kirk Ruths, MD  Clinical pharmacists have reviewed the patient's past medical history, labs, and current medications as part of preoperative protocol coverage. The following recommendations have been made:  Patient with diagnosis of atrial fibrillation on Eliquis for anticoagulation.     Procedure: EGD Date of procedure: 07/04/22     CHA2DS2-VASc Score = 4   This indicates a 4.8% annual risk of stroke. The patient's score is based upon: CHF History: 1 HTN History: 1 Diabetes History: 0 Stroke History: 0 Vascular Disease History: 1 Age Score: 1 Gender Score: 0   Chart notes some carotid stenosis with h/o TIA (at least 10 years ago)   CrCl 90 Platelet count 201   Per office protocol, patient can hold Eliquis for 2 days prior to procedure.  Please resume Eliquis as soon as possible postprocedure, at the discretion of the surgeon.   I will route this recommendation to the requesting party via Epic fax function and remove from pre-op pool.  Please call with questions.  Lenna Sciara, NP 06/27/2022, 3:59 PM

## 2022-06-29 LAB — ANTI-SMOOTH MUSCLE ANTIBODY, IGG: Actin (Smooth Muscle) Antibody (IGG): <20 U (ref <20)

## 2022-06-29 LAB — MITOCHONDRIAL ANTIBODIES: Mitochondrial M2 Ab, IgG: <=20 U (ref <=20.0)

## 2022-06-29 LAB — AFP TUMOR MARKER: AFP-Tumor Marker: 3.4 ng/mL (ref <6.1)

## 2022-06-29 NOTE — Telephone Encounter (Signed)
Patient has been notified and aware to hold Eliquis 2 days prior to his colonoscopy.

## 2022-07-04 ENCOUNTER — Encounter: Payer: Self-pay | Admitting: Gastroenterology

## 2022-07-04 ENCOUNTER — Ambulatory Visit (AMBULATORY_SURGERY_CENTER): Payer: PPO | Admitting: Gastroenterology

## 2022-07-04 VITALS — BP 117/70 | HR 50 | Temp 95.7°F | Resp 12 | Ht 70.0 in | Wt 246.0 lb

## 2022-07-04 DIAGNOSIS — R140 Abdominal distension (gaseous): Secondary | ICD-10-CM

## 2022-07-04 DIAGNOSIS — R1013 Epigastric pain: Secondary | ICD-10-CM | POA: Diagnosis not present

## 2022-07-04 DIAGNOSIS — G8929 Other chronic pain: Secondary | ICD-10-CM

## 2022-07-04 DIAGNOSIS — K2970 Gastritis, unspecified, without bleeding: Secondary | ICD-10-CM | POA: Diagnosis not present

## 2022-07-04 DIAGNOSIS — K449 Diaphragmatic hernia without obstruction or gangrene: Secondary | ICD-10-CM | POA: Diagnosis not present

## 2022-07-04 MED ORDER — PANTOPRAZOLE SODIUM 40 MG PO TBEC: 40.0000 mg | DELAYED_RELEASE_TABLET | Freq: Every day | ORAL | 3 refills | Status: AC

## 2022-07-04 MED ORDER — SODIUM CHLORIDE 0.9 % IV SOLN: 500.0000 mL | INTRAVENOUS | Status: DC

## 2022-07-04 NOTE — Progress Notes (Signed)
Sedate, gd SR, tolerated procedure well, VSS, report to RN 

## 2022-07-04 NOTE — Progress Notes (Signed)
Pt's states no medical or surgical changes since previsit or office visit. 

## 2022-07-04 NOTE — Progress Notes (Signed)
Please refer to office visit note 06/26/22. No additional changes in H&P Patient is appropriate for planned procedure(s) and anesthesia in an ambulatory setting  K. Denzil Magnuson , MD [phone]

## 2022-07-04 NOTE — Progress Notes (Signed)
Called to room to assist during endoscopic procedure.  Patient ID and intended procedure confirmed with present staff. Received instructions for my participation in the procedure from the performing physician.  

## 2022-07-04 NOTE — Op Note (Signed)
[facility] Patient Name: [name] Procedure Date: 07/04/2022 8:42 AM MRN: [id number] Endoscopist: Mauri Pole , MD, [id number] Age: 72 Referring MD:  Date of Birth: 1950-09-17 Gender: Male Account #: [id number] Procedure:                Upper GI endoscopy Indications:              Epigastric abdominal pain, Dyspepsia Medicines:                Monitored Anesthesia Care Procedure:                Pre-Anesthesia Assessment:                           - Prior to the procedure, a History and Physical                            was performed, and patient medications and                            allergies were reviewed. The patient's tolerance of                            previous anesthesia was also reviewed. The risks                            and benefits of the procedure and the sedation                            options and risks were discussed with the patient.                            All questions were answered, and informed consent                            was obtained. Prior Anticoagulants: The patient has                            taken no anticoagulant or antiplatelet agents. ASA                            Grade Assessment: II - A patient with mild systemic                            disease. After reviewing the risks and benefits,                            the patient was deemed in satisfactory condition to                            undergo the procedure.                           After obtaining informed consent, the endoscope was  passed under direct vision. Throughout the                            procedure, the patient's blood pressure, pulse, and                            oxygen saturations were monitored continuously. The                            GIF [id number] was introduced through the                            mouth, and advanced to the second part of duodenum.                            The upper GI  endoscopy was accomplished without                            difficulty. The patient tolerated the procedure                            well. Scope In: Scope Out: Findings:                 The Z-line was regular and was found 38 cm from the                            incisors.                           The examined esophagus was normal.                           A 5 cm hiatal hernia was present.                           Patchy mild inflammation characterized by                            congestion (edema), erosions, erythema and                            friability was found in the entire examined                            stomach. Biopsies were taken with a cold forceps                            for histology. Biopsies were taken with a cold                            forceps for Helicobacter pylori testing.                           The cardia and gastric fundus were normal on  retroflexion.                           The examined duodenum was normal. Complications:            No immediate complications. Estimated Blood Loss:     Estimated blood loss was minimal. Impression:               - Z-line regular, 38 cm from the incisors.                           - Normal esophagus.                           - 5 cm hiatal hernia.                           - Gastritis. Biopsied.                           - Normal examined duodenum. Recommendation:           - Patient has a contact number available for                            emergencies. The signs and symptoms of potential                            delayed complications were discussed with the                            patient. Return to normal activities tomorrow.                            Written discharge instructions were provided to the                            patient.                           - Resume previous diet.                           - Continue present medications.                            - Await pathology results.                           - Return to GI office in 3 months.                           - Follow an antireflux regimen.                           - Use Protonix (pantoprazole) 40 mg PO daily. Rx  for 90 days with 3 refills                           - Resume Eliquis (apixaban) at prior dose today.                            Refer to managing physician for further adjustment                            of therapy. Mauri Pole, MD 07/04/2022 9:03:20 AM This report has been signed electronically.

## 2022-07-04 NOTE — Patient Instructions (Addendum)
- Patient has a contact number available for emergencies. The signs and symptoms of potential delayed complications were discussed with the patient. Return to normal activities tomorrow.  Written discharge instructions were provided to the patient.                           - Resume previous diet.                           - Continue present medications.                           - Await pathology results.                           - Return to GI office in 3 months.                           - Follow an antireflux regimen.                           - Use Protonix (pantoprazole) 40 mg PO daily. Rx                            for 90 days with 3 refills                           - Resume Eliquis (apixaban) at prior dose today.                            Refer to managing physician for further adjustment                            of therapy.   YOU HAD AN ENDOSCOPIC PROCEDURE TODAY AT [facility]:   Refer to the procedure report that was given to you for any specific questions about what was found during the examination.  If the procedure report does not answer your questions, please call your gastroenterologist to clarify.  If you requested that your care partner not be given the details of your procedure findings, then the procedure report has been included in a sealed envelope for you to review at your convenience later.  YOU SHOULD EXPECT: Some feelings of bloating in the abdomen. Passage of more gas than usual.  Walking can help get rid of the air that was put into your GI tract during the procedure and reduce the bloating. If you had a lower endoscopy (such as a colonoscopy or flexible sigmoidoscopy) you may notice spotting of blood in your stool or on the toilet paper. If you underwent a bowel prep for your procedure, you may not have a normal bowel movement for a few days.  Please Note:  You might notice some irritation and congestion in your nose or some drainage.  This is  from the oxygen used during your procedure.  There is no need for concern and it should clear up in a day or so.  SYMPTOMS TO REPORT IMMEDIATELY:  Following upper endoscopy (EGD)  Vomiting of blood or coffee ground material  New chest pain or  pain under the shoulder blades  Painful or persistently difficult swallowing  New shortness of breath  Fever of 100F or higher  Black, tarry-looking stools  For urgent or emergent issues, a gastroenterologist can be reached at any hour by calling [phone]. Do not use MyChart messaging for urgent concerns.    DIET:  We do recommend a small meal at first, but then you may proceed to your regular diet.  Drink plenty of fluids but you should avoid alcoholic beverages for 24 hours.  ACTIVITY:  You should plan to take it easy for the rest of today and you should NOT DRIVE or use heavy machinery until tomorrow (because of the sedation medicines used during the test).    FOLLOW UP: Our staff will call the number listed on your records the next business day following your procedure.  We will call around 7:15- 8:00 am to check on you and address any questions or concerns that you may have regarding the information given to you following your procedure. If we do not reach you, we will leave a message.     If any biopsies were taken you will be contacted by phone or by letter within the next 1-3 weeks.  Please call us at [phone] if you have not heard about the biopsies in 3 weeks.    SIGNATURES/CONFIDENTIALITY: You and/or your care partner have signed paperwork which will be entered into your electronic medical record.  These signatures attest to the fact that that the information above on your After Visit Summary has been reviewed and is understood.  Full responsibility of the confidentiality of this discharge information lies with you and/or your care-partner.

## 2022-07-05 ENCOUNTER — Telehealth: Payer: Self-pay | Admitting: *Deleted

## 2022-07-05 NOTE — Telephone Encounter (Signed)
  Follow up Call-     07/04/2022    7:34 AM  Call back number  Post procedure Call Back phone  # [phone]  Permission to leave phone message Yes     Patient questions:  Do you have a fever, pain , or abdominal swelling? No. Pain Score  0 *  Have you tolerated food without any problems? Yes.    Have you been able to return to your normal activities? Yes.    Do you have any questions about your discharge instructions: Diet   No. Medications  No. Follow up visit  No.  Do you have questions or concerns about your Care? No.  Actions: * If pain score is 4 or above: No action needed, pain <4.

## 2022-07-06 ENCOUNTER — Ambulatory Visit (HOSPITAL_COMMUNITY)
Admission: RE | Admit: 2022-07-06 | Discharge: 2022-07-06 | Disposition: A | Discharge status: Home / Self Care | Payer: PPO | Source: Ambulatory Visit | Attending: Gastroenterology | Admitting: Gastroenterology

## 2022-07-06 DIAGNOSIS — K760 Fatty (change of) liver, not elsewhere classified: Secondary | ICD-10-CM | POA: Diagnosis not present

## 2022-07-06 DIAGNOSIS — G8929 Other chronic pain: Secondary | ICD-10-CM | POA: Insufficient documentation

## 2022-07-06 DIAGNOSIS — R945 Abnormal results of liver function studies: Secondary | ICD-10-CM | POA: Diagnosis not present

## 2022-07-06 DIAGNOSIS — K7581 Nonalcoholic steatohepatitis (NASH): Secondary | ICD-10-CM | POA: Diagnosis not present

## 2022-07-06 DIAGNOSIS — R140 Abdominal distension (gaseous): Secondary | ICD-10-CM | POA: Diagnosis not present

## 2022-07-06 DIAGNOSIS — R1013 Epigastric pain: Secondary | ICD-10-CM | POA: Diagnosis not present

## 2022-07-10 ENCOUNTER — Encounter: Payer: Self-pay | Admitting: Gastroenterology

## 2022-07-25 ENCOUNTER — Encounter: Payer: Self-pay | Admitting: *Deleted

## 2022-07-25 DIAGNOSIS — K7689 Other specified diseases of liver: Secondary | ICD-10-CM | POA: Insufficient documentation

## 2022-08-15 ENCOUNTER — Other Ambulatory Visit: Payer: Self-pay | Admitting: Cardiology

## 2022-08-15 DIAGNOSIS — I10 Essential (primary) hypertension: Secondary | ICD-10-CM

## 2022-09-04 ENCOUNTER — Ambulatory Visit: Payer: PPO | Attending: Cardiology | Admitting: Cardiology

## 2022-09-04 ENCOUNTER — Other Ambulatory Visit: Payer: Self-pay | Admitting: Cardiology

## 2022-09-04 ENCOUNTER — Encounter: Payer: Self-pay | Admitting: Cardiology

## 2022-09-04 VITALS — BP 128/80 | HR 65 | Ht 70.0 in | Wt 250.0 lb

## 2022-09-04 DIAGNOSIS — I484 Atypical atrial flutter: Secondary | ICD-10-CM

## 2022-09-04 DIAGNOSIS — D6869 Other thrombophilia: Secondary | ICD-10-CM | POA: Diagnosis not present

## 2022-09-04 DIAGNOSIS — I5022 Chronic systolic (congestive) heart failure: Secondary | ICD-10-CM

## 2022-09-04 NOTE — Progress Notes (Signed)
Electrophysiology Office Note   Date:  09/04/2022   ID:  [name], DOB 02/21/1951, MRN [id number]  PCP:  Zola Button, Grayling Congress, DO  Cardiologist:  Jens Som Primary Electrophysiologist:  Tanique Matney Jorja Loa, MD    No chief complaint on file.     History of Present Illness: [name] is a 72 y.o. male who is being seen today for the evaluation of atrial flutter at the request of Micah Flesher. Presenting today for electrophysiology evaluation.    He has a history seen for coronary artery disease, atrial flutter post ablation, mitral regurgitation postrepair in 2009.  He had atrial flutter ablation in 2009.  Seen in 2019 with atrial flutter and heart rate of 126.  He had ablation for atypical atrial flutter with termination during ablation for the right superior pulmonary vein to the mitral valve.  He has second atrial flutter and ablation 05/30/2021 with termination across the roof of the left atrium.  Today, denies symptoms of palpitations, chest pain, shortness of breath, orthopnea, PND, lower extremity edema, claudication, dizziness, presyncope, syncope, bleeding, or neurologic sequela. The patient is tolerating medications without difficulties.     Past Medical History:  Diagnosis Date   Atrial flutter    s/p ablation   CAD (coronary artery disease)    cath 06/2007 40% LM   CVD (cerebrovascular disease)    59% right ICA, 49% left ICA; h/oTIA; fu study 8/11 with normal carotids   Dysrhythmia    Gait abnormality    Heart murmur    History of bronchitis    History of colonoscopy    Horseshoe kidney    Hyperlipidemia    MR (mitral regurgitation)    severe; mitral valve repair in march 2009   MVP (mitral valve prolapse)    Osteoarthritis    both knees   Shortness of breath dyspnea    increased exertion    Past Surgical History:  Procedure Laterality Date   A-FLUTTER ABLATION N/A 01/29/2018   Procedure: A-FLUTTER ABLATION;  Surgeon: Regan Lemming, MD;   Location: [facility];  Service: Cardiovascular;  Laterality: N/A;   ATRIAL FIBRILLATION ABLATION N/A 01/21/2019   Procedure: ATRIAL FIBRILLATION ABLATION;  Surgeon: Regan Lemming, MD;  Location: [facility];  Service: Cardiovascular;  Laterality: N/A;   ATRIAL FIBRILLATION ABLATION N/A 05/30/2021   Procedure: ATRIAL FIBRILLATION ABLATION;  Surgeon: Regan Lemming, MD;  Location: [facility];  Service: Cardiovascular;  Laterality: N/A;   BACK SURGERY     secondary to ruptured disc   CARDIAC CATHETERIZATION     CARDIOVERSION N/A 09/23/2017   Procedure: CARDIOVERSION;  Surgeon: Lewayne Bunting, MD;  Location: [facility];  Service: Cardiovascular;  Laterality: N/A;   heart ablation     MITRAL VALVE REPAIR  06/2007   TEE WITHOUT CARDIOVERSION N/A 09/23/2017   Procedure: TRANSESOPHAGEAL ECHOCARDIOGRAM (TEE);  Surgeon: Lewayne Bunting, MD;  Location: [facility]COPY;  Service: Cardiovascular;  Laterality: N/A;   TONSILLECTOMY  1960   TOTAL KNEE ARTHROPLASTY Bilateral 03/09/2015   Procedure: TOTAL KNEE BILATERAL;  Surgeon: Ollen Gross, MD;  Location: [facility];  Service: Orthopedics;  Laterality: Bilateral;     Current Outpatient Medications  Medication Sig Dispense Refill   amLODipine (NORVASC) 5 MG tablet TAKE 1 TABLET BY MOUTH EVERY DAY 90 tablet 2   amoxicillin (AMOXIL) 500 MG capsule Take 2,000 mg by mouth See admin instructions. Take 2000 mg by mouth 1 hour dental procedure  apixaban (ELIQUIS) 5 MG TABS tablet Take 1 tablet (5 mg total) by mouth 2 (two) times daily. 60 tablet 5   carvedilol (COREG) 12.5 MG tablet TAKE 1 TABLET BY MOUTH 2 TIMES DAILY. PLEASE KEEP SCHEDULED APPOINTMENT FOR ADDITIONAL REFILLS. 60 tablet 2   meloxicam (MOBIC) 15 MG tablet TAKE 1 TABLET (15 MG TOTAL) BY MOUTH DAILY. 30 tablet 2   Multiple Vitamins-Minerals (CENTRUM SILVER PO) Take 1 tablet by mouth daily.     pantoprazole (PROTONIX) 40 MG tablet Take 1 tablet (40 mg total) by  mouth daily. 30 tablet 3   REPATHA SURECLICK 140 MG/ML SOAJ INJECT 140 MG INTO THE SKIN EVERY 14 (FOURTEEN) DAYS. 6 mL 3   Saw Palmetto 500 MG CAPS Take 500 mg by mouth every other day. 585 mg     telmisartan (MICARDIS) 80 MG tablet TAKE 1 TABLET BY MOUTH EVERY DAY 90 tablet 3   Vitamin E 180 MG (400 UNIT) CAPS Take 400 Units by mouth daily.     pantoprazole (PROTONIX) 40 MG tablet Take 1 tablet (40 mg total) by mouth daily. (Patient not taking: Reported on 09/04/2022) 90 tablet 3   No current facility-administered medications for this visit.    Allergies:   Praluent [alirocumab], Statins, Codeine, and Tape   Social History:  The patient  reports that he has never smoked. He has never used smokeless tobacco. He reports that he does not currently use alcohol. He reports that he does not use drugs.   Family History:  The patient's family history includes Colitis in his daughter; Crohn's disease in his daughter; Diabetes in an other family member; Hyperlipidemia in his father; Liver cancer in his father; Liver disease in his father.   ROS:  Please see the history of present illness.   Otherwise, review of systems is positive for none.   All other systems are reviewed and negative.   PHYSICAL EXAM: VS:  BP 128/80   Pulse 65   Ht  (1.778 m)   Wt 250 lb (113.4 kg)   SpO2 96%   BMI 35.87 kg/m  , BMI Body mass index is 35.87 kg/m. GEN: Well nourished, well developed, in no acute distress  HEENT: normal  Neck: no JVD, carotid bruits, or masses Cardiac: RRR; no murmurs, rubs, or gallops,no edema  Respiratory:  clear to auscultation bilaterally, normal work of breathing GI: soft, nontender, nondistended, + BS MS: no deformity or atrophy  Skin: warm and dry Neuro:  Strength and sensation are intact Psych: euthymic mood, full affect  EKG:  EKG is ordered today. Personal review of the ekg ordered shows sinus rhythm, PVCs   Recent Labs: 02/20/2022: BUN 17; Creatinine, Ser 0.97;  Potassium 4.3; Sodium 138 06/26/2022: ALT 64; Hemoglobin 15.1; Platelets 201.0    Lipid Panel     Component Value Date/Time   CHOL 126 02/20/2022 1052   CHOL 152 12/26/2021 0921   TRIG 145.0 02/20/2022 1052   HDL 48.80 02/20/2022 1052   HDL 53 12/26/2021 0921   CHOLHDL 3 02/20/2022 1052   VLDL 29.0 02/20/2022 1052   LDLCALC 48 02/20/2022 1052   LDLCALC 70 12/26/2021 0921   LDLDIRECT 147.0 01/16/2016 0908     Wt Readings from Last 3 Encounters:  09/04/22 250 lb (113.4 kg)  07/04/22 246 lb (111.6 kg)  06/26/22 246 lb (111.6 kg)      Other studies Reviewed: Additional studies/ records that were reviewed today include: TTE 05/18/22 Review of the above records today demonstrates:  1. Left ventricular ejection fraction, by estimation, is 45 to 50%. The  left ventricle has mildly decreased function. The left ventricle has no  regional wall motion abnormalities. Left ventricular diastolic parameters  are indeterminate. Elevated left  ventricular end-diastolic pressure.   2. Right ventricular systolic function is low normal. The right  ventricular size is normal.   3. Left atrial size was severely dilated.   4. The mitral valve has been repaired/replaced. Trivial mitral valve  regurgitation. No evidence of mitral stenosis. The mean mitral valve  gradient is 3.0 mmHg. There is a prosthetic annuloplasty ring present in  the mitral position. Procedure Date: 2009.   5. The aortic valve is tricuspid. Aortic valve regurgitation is not  visualized.   6. Aortic dilatation noted. There is mild dilatation of the aortic root,  measuring 42 mm.   ASSESSMENT AND PLAN:  1.  Atypical atrial flutter: Status post ablation 01/21/2019 with tachycardia terminating with mitral valve 1.  Status post repeat ablation 05/30/2021 with termination along the roof of the left atrium.  CHA2DS2-VASc of 3.  Currently on Eliquis.  2.  Hypertension:well controlled  3.  Chronic systolic heart failure: Likely  tachycardia mediated.  Plan per primary cardiology.  No obvious volume overload.  4.  Coronary artery disease: Mild without chest pain  5.  Second hypercoagulable state: Currently on Eliquis for atrial flutter as above  6.  Obesity: Lifestyle modification encouraged Body mass index is 35.87 kg/m.   Current medicines are reviewed at length with the patient today.   The patient does not have concerns regarding his medicines.  The following changes were made today: none  Labs/ tests ordered today include:  Orders Placed This Encounter  Procedures   EKG 12-Lead      Disposition:   FU 12 months  Signed, Avyonna Wagoner Jorja Loa, MD  09/04/2022 3:34 PM     [facility] [address] [place] [address] [phone] (office) [phone] (fax)

## 2022-09-04 NOTE — Patient Instructions (Signed)
Medication Instructions:  Your physician recommends that you continue on your current medications as directed. Please refer to the Current Medication list given to you today.  *If you need a refill on your cardiac medications before your next appointment, please call your pharmacy*   Lab Work: None ordered If you have labs (blood work) drawn today and your tests are completely normal, you will receive your results only by: . MyChart Message (if you have MyChart) OR . A paper copy in the mail If you have any lab test that is abnormal or we need to change your treatment, we will call you to review the results.   Testing/Procedures: None ordered   Follow-Up: At [facility], you and your health needs are our priority.  As part of our continuing mission to provide you with exceptional heart care, we have created designated Provider Care Teams.  These Care Teams include your primary Cardiologist (physician) and Advanced Practice Providers (APPs -  Physician Assistants and Nurse Practitioners) who all work together to provide you with the care you need, when you need it.  We recommend signing up for the patient portal called "MyChart".  Sign up information is provided on this After Visit Summary.  MyChart is used to connect with patients for Virtual Visits (Telemedicine).  Patients are able to view lab/test results, encounter notes, upcoming appointments, etc.  Non-urgent messages can be sent to your provider as well.   To learn more about what you can do with MyChart, go to [email or web address].    Your next appointment:   1 year(s)  The format for your next appointment:   In Person  Provider:   Will Camnitz, MD   Thank you for choosing [facility]!!   Cassi Jenne, RN [phone]    Other Instructions    

## 2022-10-23 ENCOUNTER — Other Ambulatory Visit: Payer: Self-pay | Admitting: Cardiology

## 2022-10-24 ENCOUNTER — Other Ambulatory Visit: Payer: Self-pay | Admitting: Cardiology

## 2022-10-24 DIAGNOSIS — I484 Atypical atrial flutter: Secondary | ICD-10-CM

## 2022-10-24 DIAGNOSIS — I4891 Unspecified atrial fibrillation: Secondary | ICD-10-CM

## 2022-10-24 NOTE — Telephone Encounter (Signed)
Prescription refill request for Eliquis received. Indication: A Flutter Last office visit: 09/04/22  Carleene Mains MD Scr: 0.97 on 02/20/22  Epic Age: 72 Weight: 113.4kg  Based on above findings Eliquis 5mg  twice daily is the appropriate dose.  Refill approved.

## 2023-01-23 ENCOUNTER — Ambulatory Visit: Payer: PPO | Admitting: Gastroenterology

## 2023-01-23 ENCOUNTER — Encounter: Payer: Self-pay | Admitting: Gastroenterology

## 2023-01-23 ENCOUNTER — Other Ambulatory Visit (INDEPENDENT_AMBULATORY_CARE_PROVIDER_SITE_OTHER): Payer: PPO

## 2023-01-23 VITALS — BP 126/64 | HR 85 | Ht 70.0 in | Wt 253.0 lb

## 2023-01-23 DIAGNOSIS — Z8719 Personal history of other diseases of the digestive system: Secondary | ICD-10-CM | POA: Diagnosis not present

## 2023-01-23 DIAGNOSIS — K219 Gastro-esophageal reflux disease without esophagitis: Secondary | ICD-10-CM

## 2023-01-23 DIAGNOSIS — K760 Fatty (change of) liver, not elsewhere classified: Secondary | ICD-10-CM

## 2023-01-23 DIAGNOSIS — K589 Irritable bowel syndrome without diarrhea: Secondary | ICD-10-CM | POA: Diagnosis not present

## 2023-01-23 LAB — HEPATIC FUNCTION PANEL
ALT: 70 U/L — ABNORMAL HIGH (ref 0–53)
AST: 41 U/L — ABNORMAL HIGH (ref 0–37)
Albumin: 4.3 g/dL (ref 3.5–5.2)
Alkaline Phosphatase: 52 U/L (ref 39–117)
Bilirubin, Direct: 0.2 mg/dL (ref 0.0–0.3)
Total Bilirubin: 0.9 mg/dL (ref 0.2–1.2)
Total Protein: 7 g/dL (ref 6.0–8.3)

## 2023-01-23 NOTE — Progress Notes (Signed)
[name]    [id number]    Jan 04, 1951  Primary Care Physician:Lowne Almeta Monas Grayling Congress, DO  Referring Physician: Zola Button, Grayling Congress, DO [address] [place],  [place] [address]   Chief complaint:   Chief Complaint  Patient presents with   Gastroesophageal Reflux    Patient reports he takes pantoprazole as needed and it helps with his reflux and abd pain. Patient has some questions regarding side effects and how long he needs to be on this medication.    HPI: 72 year old very pleasant gentleman with complaints of GERD and Fatty liver.   He was last seen on 06-26-22.   Today, he complains of occasional abdominal cramping along with more frequent burping and gas. He is currently taking Pantoprazole 40 mg as needed which can be once every 1-2 weeks. He tried taking antiacids but states that pantoprazole tends to relief his symptoms better.   We also reviewed his previous EGD results and discussed his hiatal hernia.  Patient denies any diarrhea, constipation, nausea, blood in stool, black stool, vomiting, bloating, unintentional weight loss, reflux, dysphagia.  GI Hx:  US Abdomen RUQ W/elastography 07-06-22 -No gallstones or wall thickening visualized. No sonographic Murphy sign noted. -Common bile duct: -Diameter: 3.7 mm -No focal lesion identified. Increased parenchymal echogenicity. Portal vein is patent on color Doppler imaging with normal direction of blood flow towards the liver. -Median kPa: 2.2   EGD 07-04-22 - Z-line regular, 38 cm from the incisors.  - Normal esophagus.  - 5 cm hiatal hernia.  - Gastritis. Biopsied.  - Normal examined duodenum. Surgical [P], gastric - ANTRAL MUCOSA WITH FEATURES OF BOTH MILD CHRONIC INACTIVE GASTRITIS AND CHEMICAL/REACTIVE CHANGE. - OXYNTIC MUCOSA WITH NO SIGNIFICANT PATHOLOGY. - NO HELICOBACTER PYLORI ORGANISMS IDENTIFIED ON H&E STAINED SLIDE.  Colonoscopy 05-06-2018 - One 5 mm polyp in the  sigmoid colon, removed with a cold snare. Resected and retrieved.  - Two 1 to 2 mm polyps in the transverse colon, removed with a cold biopsy forceps. Resected and retrieved.  - Diverticulosis in the sigmoid colon and in the descending colon.  - Non-bleeding internal hemorrhoids. Surgical [P], transverse, sigmoid, polyp (3) - TUBULAR ADENOMA (X2 FRAGMENTS). - HYPERPLASTIC POLYP. - NO HIGH GRADE DYSPLASIA OR MALIGNANCY.  Colonoscopy 03-31-12 -Flat polyp 3-5 mm in size in the sigmoid colon -otherwise normal Surgical [P], sigmoid, polyp - TUBULAR ADENOMA. - NEGATIVE FOR HIGH GRADE DYSPLASIA.     Latest Ref Rng & Units 06/26/2022    2:46 PM 02/20/2022   10:52 AM 12/26/2021    9:21 AM  Hepatic Function  Total Protein 6.0 - 8.3 g/dL 7.5  7.0  6.8   Albumin 3.5 - 5.2 g/dL 4.9  4.6  5.1   AST 0 - 37 U/L 35  44  50   ALT 0 - 53 U/L 64  91  92   Alk Phosphatase 39 - 117 U/L 51  55  67   Total Bilirubin 0.2 - 1.2 mg/dL 0.9  1.3  1.0   Bilirubin, Direct 0.0 - 0.3 mg/dL 0.2   5.62      Current Outpatient Medications:    amLODipine (NORVASC) 5 MG tablet, TAKE 1 TABLET BY MOUTH EVERY DAY, Disp: 90 tablet, Rfl: 2   amoxicillin (AMOXIL) 500 MG capsule, Take 2,000 mg by mouth See admin instructions. Take 2000 mg by mouth 1 hour dental procedure, Disp: , Rfl:  carvedilol (COREG) 12.5 MG tablet, TAKE 1 TABLET BY MOUTH 2 TIMES DAILY. PLEASE KEEP SCHEDULED APPOINTMENT FOR ADDITIONAL REFILLS., Disp: 60 tablet, Rfl: 10   ELIQUIS 5 MG TABS tablet, TAKE 1 TABLET BY MOUTH TWICE A DAY, Disp: 60 tablet, Rfl: 5   Evolocumab (REPATHA SURECLICK) 140 MG/ML SOAJ, INJECT 140 MG INTO THE SKIN EVERY 14 (FOURTEEN) DAYS., Disp: 2 mL, Rfl: 11   meloxicam (MOBIC) 15 MG tablet, TAKE 1 TABLET (15 MG TOTAL) BY MOUTH DAILY., Disp: 30 tablet, Rfl: 2   Multiple Vitamins-Minerals (CENTRUM SILVER PO), Take 1 tablet by mouth daily., Disp: , Rfl:    pantoprazole (PROTONIX) 40 MG tablet, Take 1 tablet (40 mg total) by mouth  daily., Disp: 90 tablet, Rfl: 3   Saw Palmetto 500 MG CAPS, Take 500 mg by mouth every other day. 585 mg, Disp: , Rfl:    telmisartan (MICARDIS) 80 MG tablet, TAKE 1 TABLET BY MOUTH EVERY DAY, Disp: 90 tablet, Rfl: 3   Vitamin E 180 MG (400 UNIT) CAPS, Take 400 Units by mouth daily., Disp: , Rfl:     Allergies as of 01/23/2023 - Review Complete 01/23/2023  Allergen Reaction Noted   Praluent [alirocumab] Shortness Of Breath 05/30/2020   Statins Other (See Comments) 02/08/2014   Codeine Nausea Only    Tape Rash 03/17/2012    Past Medical History:  Diagnosis Date   Atrial flutter (HCC)    s/p ablation   CAD (coronary artery disease)    cath 06/2007 40% LM   CVD (cerebrovascular disease)    59% right ICA, 49% left ICA; h/oTIA; fu study 8/11 with normal carotids   Dysrhythmia    Gait abnormality    Heart murmur    History of bronchitis    History of colonoscopy    Horseshoe kidney    Hyperlipidemia    MR (mitral regurgitation)    severe; mitral valve repair in march 2009   MVP (mitral valve prolapse)    Osteoarthritis    both knees   Shortness of breath dyspnea    increased exertion     Past Surgical History:  Procedure Laterality Date   A-FLUTTER ABLATION N/A 01/29/2018   Procedure: A-FLUTTER ABLATION;  Surgeon: Regan Lemming, MD;  Location: [facility];  Service: Cardiovascular;  Laterality: N/A;   ATRIAL FIBRILLATION ABLATION N/A 01/21/2019   Procedure: ATRIAL FIBRILLATION ABLATION;  Surgeon: Regan Lemming, MD;  Location: [facility];  Service: Cardiovascular;  Laterality: N/A;   ATRIAL FIBRILLATION ABLATION N/A 05/30/2021   Procedure: ATRIAL FIBRILLATION ABLATION;  Surgeon: Regan Lemming, MD;  Location: [facility];  Service: Cardiovascular;  Laterality: N/A;   BACK SURGERY     secondary to ruptured disc   CARDIAC CATHETERIZATION     CARDIOVERSION N/A 09/23/2017   Procedure: CARDIOVERSION;  Surgeon: Lewayne Bunting, MD;  Location:  [facility] ENDOSCOPY;  Service: Cardiovascular;  Laterality: N/A;   heart ablation     MITRAL VALVE REPAIR  06/2007   TEE WITHOUT CARDIOVERSION N/A 09/23/2017   Procedure: TRANSESOPHAGEAL ECHOCARDIOGRAM (TEE);  Surgeon: Lewayne Bunting, MD;  Location: [facility] ENDOSCOPY;  Service: Cardiovascular;  Laterality: N/A;   TONSILLECTOMY  1960   TOTAL KNEE ARTHROPLASTY Bilateral 03/09/2015   Procedure: TOTAL KNEE BILATERAL;  Surgeon: Ollen Gross, MD;  Location: [facility] ORS;  Service: Orthopedics;  Laterality: Bilateral;    Family History  Problem Relation Age of Onset   Liver disease Father  cancer   Hyperlipidemia Father    Liver cancer Father    Diabetes Other    Colitis Daughter    Crohn's disease Daughter    Colon cancer Neg Hx    Esophageal cancer Neg Hx    Rectal cancer Neg Hx    Stomach cancer Neg Hx     Social History   Socioeconomic History   Marital status: Married    Spouse name: Not on file   Number of children: 2   Years of education: Not on file   Highest education level: Not on file  Occupational History   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   Occupation: retired  Tobacco Use   Smoking status: Never   Smokeless tobacco: Never  Vaping Use   Vaping status: Never Used  Substance and Sexual Activity   Alcohol use: Not Currently    Comment: occasional 1-2 wine drinks a month   Drug use: No   Sexual activity: Yes    Partners: Female  Other Topics Concern   Not on file  Social History Narrative   Exercise- no   Social Determinants of Health   Financial Resource Strain: Not on file  Food Insecurity: Not on file  Transportation Needs: Not on file  Physical Activity: Not on file  Stress: Not on file  Social Connections: Not on file  Intimate Partner Violence: Not on file     Review of systems: Review of Systems  Constitutional:  Negative for unexpected weight change.  HENT:  Negative for trouble swallowing.   Gastrointestinal:  Positive for  abdominal pain. Negative for abdominal distention, anal bleeding, blood in stool, constipation, diarrhea, nausea, rectal pain and vomiting.       +belching  +gas      Physical Exam: Vitals:   01/23/23 1401  BP: 126/64  Pulse: 85    Body mass index is 36.3 kg/m. General: well-appearing   Eyes: sclera anicteric, no redness ENT: oral mucosa moist without lesions, no cervical or supraclavicular lymphadenopathy CV: RRR, no JVD, no peripheral edema Resp: clear to auscultation bilaterally, normal RR and effort noted GI: soft, no tenderness, with active bowel sounds. No guarding or palpable organomegaly noted. Skin; warm and dry, no rash or jaundice noted Neuro: awake, alert and oriented x 3. Normal gross motor function and fluent speech   Data Reviewed:  Reviewed labs, radiology imaging, old records and pertinent past GI work up   Assessment and Plan/Recommendations:  72 year old very pleasant gentleman with steatohepatitis Persistent mild elevation in transaminases, continue to monitor every 6 months  Abdominal ultrasound with elastography negative for significant fibrosis Negative for any other etiology for chronic liver disease, negative for hepatitis or autoimmune liver disease Discussed dietary modifications and daily exercise  GERD and hiatal hernia:Continue antireflux measures  Continue Pantoprazole 40 mg as needed  IBS symptoms with abdominal bloating: Use IBGard 1 capsule up to 3 times daily as needed  Follow up in 6 months    The patient was provided an opportunity to ask questions and all were answered. The patient agreed with the plan and demonstrated an understanding of the instructions.   I,Safa M Kadhim,acting as a scribe for Marsa Aris, MD.,have documented all relevant documentation on the behalf of Marsa Aris, MD,as directed by  Marsa Aris, MD while in the presence of Marsa Aris, MD.   I, Marsa Aris, MD, have reviewed all  documentation for this visit. The documentation on 01/23/23 for the exam,  diagnosis, procedures, and orders are all accurate and complete.   Iona Beard , MD    CC: Zola Button, Grayling Congress, *

## 2023-01-23 NOTE — Patient Instructions (Addendum)
Use Protonix as needed  Your provider has requested that you go to the basement level for lab work before leaving today. Press "B" on the elevator. The lab is located at the first door on the left as you exit the elevator.   Daily Exercise   Gastroesophageal Reflux Disease, Adult Gastroesophageal reflux (GER) happens when acid from the stomach flows up into the tube that connects the mouth and the stomach (esophagus). Normally, food travels down the esophagus and stays in the stomach to be digested. However, when a person has GER, food and stomach acid sometimes move back up into the esophagus. If this becomes a more serious problem, the person may be diagnosed with a disease called gastroesophageal reflux disease (GERD). GERD occurs when the reflux: Happens often. Causes frequent or severe symptoms. Causes problems such as damage to the esophagus. When stomach acid comes in contact with the esophagus, the acid may cause inflammation in the esophagus. Over time, GERD may create small holes (ulcers) in the lining of the esophagus. What are the causes? This condition is caused by a problem with the muscle between the esophagus and the stomach (lower esophageal sphincter, or LES). Normally, the LES muscle closes after food passes through the esophagus to the stomach. When the LES is weakened or abnormal, it does not close properly, and that allows food and stomach acid to go back up into the esophagus. The LES can be weakened by certain dietary substances, medicines, and medical conditions, including: Tobacco use. Pregnancy. Having a hiatal hernia. Alcohol use. Certain foods and beverages, such as coffee, chocolate, onions, and peppermint. What increases the risk? You are more likely to develop this condition if you: Have an increased body weight. Have a connective tissue disorder. Take NSAIDs, such as ibuprofen. What are the signs or symptoms? Symptoms of this condition  include: Heartburn. Difficult or painful swallowing and the feeling of having a lump in the throat. A bitter taste in the mouth. Bad breath and having a large amount of saliva. Having an upset or bloated stomach and belching. Chest pain. Different conditions can cause chest pain. Make sure you see your health care provider if you experience chest pain. Shortness of breath or wheezing. Ongoing (chronic) cough or a nighttime cough. Wearing away of tooth enamel. Weight loss. How is this diagnosed? This condition may be diagnosed based on a medical history and a physical exam. To determine if you have mild or severe GERD, your health care provider may also monitor how you respond to treatment. You may also have tests, including: A test to examine your stomach and esophagus with a small camera (endoscopy). A test that measures the acidity level in your esophagus. A test that measures how much pressure is on your esophagus. A barium swallow or modified barium swallow test to show the shape, size, and functioning of your esophagus. How is this treated? Treatment for this condition may vary depending on how severe your symptoms are. Your health care provider may recommend: Changes to your diet. Medicine. Surgery. The goal of treatment is to help relieve your symptoms and to prevent complications. Follow these instructions at home: Eating and drinking  Follow a diet as recommended by your health care provider. This may involve avoiding foods and drinks such as: Coffee and tea, with or without caffeine. Drinks that contain alcohol. Energy drinks and sports drinks. Carbonated drinks or sodas. Chocolate and cocoa. Peppermint and mint flavorings. Garlic and onions. Horseradish. Spicy and acidic foods, including peppers,  chili powder, curry powder, vinegar, hot sauces, and barbecue sauce. Citrus fruit juices and citrus fruits, such as oranges, lemons, and limes. Tomato-based foods, such as red  sauce, chili, salsa, and pizza with red sauce. Fried and fatty foods, such as donuts, french fries, potato chips, and high-fat dressings. High-fat meats, such as hot dogs and fatty cuts of red and white meats, such as rib eye steak, sausage, ham, and bacon. High-fat dairy items, such as whole milk, butter, and cream cheese. Eat small, frequent meals instead of large meals. Avoid drinking large amounts of liquid with your meals. Avoid eating meals during the 2-3 hours before bedtime. Avoid lying down right after you eat. Do not exercise right after you eat. Lifestyle  Do not use any products that contain nicotine or tobacco. These products include cigarettes, chewing tobacco, and vaping devices, such as e-cigarettes. If you need help quitting, ask your health care provider. Try to reduce your stress by using methods such as yoga or meditation. If you need help reducing stress, ask your health care provider. If you are overweight, reduce your weight to an amount that is healthy for you. Ask your health care provider for guidance about a safe weight loss goal. General instructions Pay attention to any changes in your symptoms. Take over-the-counter and prescription medicines only as told by your health care provider. Do not take aspirin, ibuprofen, or other NSAIDs unless your health care provider told you to take these medicines. Wear loose-fitting clothing. Do not wear anything tight around your waist that causes pressure on your abdomen. Raise (elevate) the head of your bed about 6 inches (15 cm). You can use a wedge to do this. Avoid bending over if this makes your symptoms worse. Keep all follow-up visits. This is important. Contact a health care provider if: You have: New symptoms. Unexplained weight loss. Difficulty swallowing or it hurts to swallow. Wheezing or a persistent cough. A hoarse voice. Your symptoms do not improve with treatment. Get help right away if: You have sudden  pain in your arms, neck, jaw, teeth, or back. You suddenly feel sweaty, dizzy, or light-headed. You have chest pain or shortness of breath. You vomit and the vomit is green, yellow, or black, or it looks like blood or coffee grounds. You faint. You have stool that is red, bloody, or black. You cannot swallow, drink, or eat. These symptoms may represent a serious problem that is an emergency. Do not wait to see if the symptoms will go away. Get medical help right away. Call your local emergency services (911 in the [place] Do not drive yourself to the hospital. Summary Gastroesophageal reflux happens when acid from the stomach flows up into the esophagus. GERD is a disease in which the reflux happens often, causes frequent or severe symptoms, or causes problems such as damage to the esophagus. Treatment for this condition may vary depending on how severe your symptoms are. Your health care provider may recommend diet and lifestyle changes, medicine, or surgery. Contact a health care provider if you have new or worsening symptoms. Take over-the-counter and prescription medicines only as told by your health care provider. Do not take aspirin, ibuprofen, or other NSAIDs unless your health care provider told you to do so. Keep all follow-up visits as told by your health care provider. This is important. This information is not intended to replace advice given to you by your health care provider. Make sure you discuss any questions you have with your health care  provider. Document Revised: 11/14/2019 Document Reviewed: 11/16/2019 Elsevier Patient Education  2024 [employer].    Due to recent changes in healthcare laws, you may see the results of your imaging and laboratory studies on MyChart before your provider has had a chance to review them.  We understand that in some cases there may be results that are confusing or concerning to you. Not all laboratory results come back in the same time frame and  the provider may be waiting for multiple results in order to interpret others.  Please give [place] 48 hours in order for your provider to thoroughly review all the results before contacting the office for clarification of your results.    I appreciate the  opportunity to care for you  Thank You   Marsa Aris , MD

## 2023-01-30 DIAGNOSIS — H524 Presbyopia: Secondary | ICD-10-CM | POA: Diagnosis not present

## 2023-01-30 DIAGNOSIS — D23111 Other benign neoplasm of skin of right upper eyelid, including canthus: Secondary | ICD-10-CM | POA: Diagnosis not present

## 2023-01-30 DIAGNOSIS — H35031 Hypertensive retinopathy, right eye: Secondary | ICD-10-CM | POA: Diagnosis not present

## 2023-01-30 DIAGNOSIS — H52223 Regular astigmatism, bilateral: Secondary | ICD-10-CM | POA: Diagnosis not present

## 2023-01-30 DIAGNOSIS — H43811 Vitreous degeneration, right eye: Secondary | ICD-10-CM | POA: Diagnosis not present

## 2023-01-30 DIAGNOSIS — H5203 Hypermetropia, bilateral: Secondary | ICD-10-CM | POA: Diagnosis not present

## 2023-01-30 DIAGNOSIS — H2513 Age-related nuclear cataract, bilateral: Secondary | ICD-10-CM | POA: Diagnosis not present

## 2023-02-04 ENCOUNTER — Encounter: Payer: Self-pay | Admitting: Gastroenterology

## 2023-03-02 ENCOUNTER — Other Ambulatory Visit: Payer: Self-pay | Admitting: Cardiology

## 2023-04-16 DIAGNOSIS — I484 Atypical atrial flutter: Secondary | ICD-10-CM | POA: Diagnosis not present

## 2023-04-16 DIAGNOSIS — I10 Essential (primary) hypertension: Secondary | ICD-10-CM | POA: Diagnosis not present

## 2023-04-16 DIAGNOSIS — K219 Gastro-esophageal reflux disease without esophagitis: Secondary | ICD-10-CM | POA: Diagnosis not present

## 2023-04-16 DIAGNOSIS — E559 Vitamin D deficiency, unspecified: Secondary | ICD-10-CM | POA: Diagnosis not present

## 2023-04-16 DIAGNOSIS — M1990 Unspecified osteoarthritis, unspecified site: Secondary | ICD-10-CM | POA: Diagnosis not present

## 2023-04-16 DIAGNOSIS — E669 Obesity, unspecified: Secondary | ICD-10-CM | POA: Diagnosis not present

## 2023-04-16 DIAGNOSIS — E785 Hyperlipidemia, unspecified: Secondary | ICD-10-CM | POA: Diagnosis not present

## 2023-04-16 DIAGNOSIS — D6869 Other thrombophilia: Secondary | ICD-10-CM | POA: Diagnosis not present

## 2023-04-16 DIAGNOSIS — N400 Enlarged prostate without lower urinary tract symptoms: Secondary | ICD-10-CM | POA: Diagnosis not present

## 2023-04-25 DIAGNOSIS — D485 Neoplasm of uncertain behavior of skin: Secondary | ICD-10-CM | POA: Diagnosis not present

## 2023-04-25 DIAGNOSIS — B078 Other viral warts: Secondary | ICD-10-CM | POA: Diagnosis not present

## 2023-05-01 ENCOUNTER — Ambulatory Visit (INDEPENDENT_AMBULATORY_CARE_PROVIDER_SITE_OTHER): Payer: PPO

## 2023-05-01 DIAGNOSIS — Z23 Encounter for immunization: Secondary | ICD-10-CM

## 2023-05-08 ENCOUNTER — Other Ambulatory Visit: Payer: Self-pay | Admitting: Cardiology

## 2023-05-08 DIAGNOSIS — I10 Essential (primary) hypertension: Secondary | ICD-10-CM

## 2023-05-10 ENCOUNTER — Other Ambulatory Visit: Payer: Self-pay | Admitting: Cardiology

## 2023-05-10 ENCOUNTER — Other Ambulatory Visit: Payer: Self-pay

## 2023-05-10 MED ORDER — CARVEDILOL 12.5 MG PO TABS: 12.5000 mg | ORAL_TABLET | Freq: Two times a day (BID) | ORAL | 3 refills | Status: DC

## 2023-05-11 NOTE — Progress Notes (Unsigned)
HPI: FU mitral valve repair secondary to severe mitral regurgitation in February 2009. Note preoperative catheterization showed a 40% left main, 30% LAD and 30% right coronary artery. He has also had atrial flutter ablation in March 2009.  Carotid dopplers in August of 2011 were normal. Patient has had elevated LFTs with statins in the past. Nuclear study 10/16 showed EF 49 with normal perfusion. TEE/DCCV of atrial flutter May 2019; images showed ejection fraction 35 to 40%, mildly dilated aortic root, mild mitral stenosis and mitral regurgitation; severe left atrial enlargement. Cardiac CTA January 2023 showed calcium score of 117 which is 73rd percentile and mild nonobstructive coronary disease: Normal caliber aorta.  Underwent repeat atrial flutter ablation May 30, 2021.  Echocardiogram December 2023 showed ejection fraction 45 to 50%, severe left atrial enlargement, status post mitral valve repair with mean gradient 3 mmHg and trace mitral regurgitation, mildly dilated aortic root at 42 mm.  Since I last saw him, the patient has dyspnea with more extreme activities but not with routine activities. It is relieved with rest. It is not associated with chest pain. There is no orthopnea, PND or pedal edema. There is no syncope or palpitations. There is no exertional chest pain.   Current Outpatient Medications  Medication Sig Dispense Refill   amLODipine (NORVASC) 5 MG tablet TAKE 1 TABLET BY MOUTH EVERY DAY 90 tablet 0   amoxicillin (AMOXIL) 500 MG capsule Take 2,000 mg by mouth See admin instructions. Take 2000 mg by mouth 1 hour dental procedure     apixaban (ELIQUIS) 5 MG TABS tablet TAKE 1 TABLET BY MOUTH TWICE A DAY 60 tablet 0   carvedilol (COREG) 12.5 MG tablet Take 1 tablet (12.5 mg total) by mouth 2 (two) times daily with a meal. 180 tablet 3   Cholecalciferol (VITAMIN D3) 50 MCG (2000 UT) capsule      Evolocumab (REPATHA SURECLICK) 140 MG/ML SOAJ INJECT 140 MG INTO THE SKIN EVERY 14  (FOURTEEN) DAYS. 2 mL 11   meloxicam (MOBIC) 15 MG tablet TAKE 1 TABLET (15 MG TOTAL) BY MOUTH DAILY. 30 tablet 2   Multiple Vitamins-Minerals (CENTRUM SILVER PO) Take 1 tablet by mouth daily.     pantoprazole (PROTONIX) 40 MG tablet Take 1 tablet (40 mg total) by mouth daily. 90 tablet 3   Saw Palmetto 500 MG CAPS Take 500 mg by mouth every other day. 585 mg     telmisartan (MICARDIS) 80 MG tablet TAKE 1 TABLET BY MOUTH EVERY DAY 90 tablet 3   Vitamin E 180 MG (400 UNIT) CAPS Take 400 Units by mouth daily.     No current facility-administered medications for this visit.     Past Medical History:  Diagnosis Date   Atrial flutter (HCC)    s/p ablation   CAD (coronary artery disease)    cath 06/2007 40% LM   CVD (cerebrovascular disease)    59% right ICA, 49% left ICA; h/oTIA; fu study 8/11 with normal carotids   Dysrhythmia    Gait abnormality    Heart murmur    History of bronchitis    History of colonoscopy    Horseshoe kidney    Hyperlipidemia    MR (mitral regurgitation)    severe; mitral valve repair in march 2009   MVP (mitral valve prolapse)    Osteoarthritis    both knees   Shortness of breath dyspnea    increased exertion     Past Surgical History:  Procedure Laterality Date  A-FLUTTER ABLATION N/A 01/29/2018   Procedure: A-FLUTTER ABLATION;  Surgeon: Regan Lemming, MD;  Location: [facility];  Service: Cardiovascular;  Laterality: N/A;   ATRIAL FIBRILLATION ABLATION N/A 01/21/2019   Procedure: ATRIAL FIBRILLATION ABLATION;  Surgeon: Regan Lemming, MD;  Location: [facility];  Service: Cardiovascular;  Laterality: N/A;   ATRIAL FIBRILLATION ABLATION N/A 05/30/2021   Procedure: ATRIAL FIBRILLATION ABLATION;  Surgeon: Regan Lemming, MD;  Location: [facility];  Service: Cardiovascular;  Laterality: N/A;   BACK SURGERY     secondary to ruptured disc   CARDIAC CATHETERIZATION     CARDIOVERSION N/A 09/23/2017   Procedure:  CARDIOVERSION;  Surgeon: Lewayne Bunting, MD;  [facility] END[facility];  Service: Cardiovascular;  Laterality: N/A;   heart ablation     MITRAL VALVE REPAIR  06/2007   TEE WITHOUT CARDIOVERSION N/A 09/23/2017   Procedure: TRANSESOPHAGEAL ECHOCARDIOGRAM (TEE);  Surgeon: Lewayne Bunting, MD;  Location: [facility] ENDOSCOPY;  Service: Cardiovascular;  Laterality: N/A;   TONSILLECTOMY  1960   TOTAL KNEE ARTHROPLASTY Bilateral 03/09/2015   Procedure: TOTAL KNEE BILATERAL;  Surgeon: Ollen Gross, MD;  Location: [facility];  Service: Orthopedics;  Laterality: Bilateral;    Social History   Socioeconomic History   Marital status: Married    Spouse name: Not on file   Number of children: 2   Years of education: Not on file   Highest education level: Not on file  Occupational History   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   Occupation: retired  Tobacco Use   Smoking status: Never   Smokeless tobacco: Never  Vaping Use   Vaping status: Never Used  Substance and Sexual Activity   Alcohol use: Not Currently    Comment: occasional 1-2 wine drinks a month   Drug use: No   Sexual activity: Yes    Partners: Female  Other Topics Concern   Not on file  Social History Narrative   Exercise- no   Social Drivers of Corporate investment banker Strain: Not on file  Food Insecurity: Not on file  Transportation Needs: Not on file  Physical Activity: Not on file  Stress: Not on file  Social Connections: Not on file  Intimate Partner Violence: Not on file    Family History  Problem Relation Age of Onset   Liver disease Father        cancer   Hyperlipidemia Father    Liver cancer Father    Diabetes Other    Colitis Daughter    Crohn's disease Daughter    Colon cancer Neg Hx    Esophageal cancer Neg Hx    Rectal cancer Neg Hx    Stomach cancer Neg Hx     ROS: no fevers or chills, productive cough, hemoptysis, dysphasia, odynophagia, melena, hematochezia, dysuria, hematuria,  rash, seizure activity, orthopnea, PND, pedal edema, claudication. Remaining systems are negative.  Physical Exam: Well-developed well-nourished in no acute distress.  Skin is warm and dry.  HEENT is normal.  Neck is supple.  Chest is clear to auscultation with normal expansion.  Cardiovascular exam is regular rate and rhythm.  Abdominal exam nontender or distended. No masses palpated. Extremities show no edema. neuro grossly intact   A/P  1 status post mitral valve repair-continue SBE prophylaxis.  2 history of atrial flutter-status post ablation.  Patient remains in sinus rhythm on exam.  Continue apixaban.  Check hemoglobin and renal function.  3 hypertension-patient's blood pressure is controlled today.  Continue present medications.  4 hyperlipidemia-continue Repatha.  Intolerant to statins.  Check lipids and liver.  5 coronary artery disease-mild on previous heart catheterization.  6 history of cardiomyopathy-LV function is mildly reduced on most recent echocardiogram.  Continue ARB and beta-blocker.  He is not in congestive heart failure on examination.  7 obesity-we discussed importance of weight loss.  Olga Millers, MD

## 2023-05-14 ENCOUNTER — Encounter: Payer: Self-pay | Admitting: Cardiology

## 2023-05-14 ENCOUNTER — Other Ambulatory Visit: Payer: Self-pay | Admitting: Cardiology

## 2023-05-14 ENCOUNTER — Ambulatory Visit: Payer: PPO | Attending: Cardiovascular Disease | Admitting: Cardiology

## 2023-05-14 VITALS — BP 130/76 | HR 64 | Ht 70.0 in | Wt 253.0 lb

## 2023-05-14 DIAGNOSIS — Z9889 Other specified postprocedural states: Secondary | ICD-10-CM | POA: Diagnosis not present

## 2023-05-14 DIAGNOSIS — I420 Dilated cardiomyopathy: Secondary | ICD-10-CM

## 2023-05-14 DIAGNOSIS — I10 Essential (primary) hypertension: Secondary | ICD-10-CM

## 2023-05-14 DIAGNOSIS — I484 Atypical atrial flutter: Secondary | ICD-10-CM

## 2023-05-14 DIAGNOSIS — I4891 Unspecified atrial fibrillation: Secondary | ICD-10-CM

## 2023-05-14 DIAGNOSIS — I2510 Atherosclerotic heart disease of native coronary artery without angina pectoris: Secondary | ICD-10-CM

## 2023-05-14 DIAGNOSIS — E785 Hyperlipidemia, unspecified: Secondary | ICD-10-CM | POA: Diagnosis not present

## 2023-05-14 LAB — CBC

## 2023-05-14 MED ORDER — APIXABAN 5 MG PO TABS: 5.0000 mg | ORAL_TABLET | Freq: Two times a day (BID) | ORAL | 3 refills | Status: DC

## 2023-05-14 MED ORDER — AMLODIPINE BESYLATE 5 MG PO TABS: 5.0000 mg | ORAL_TABLET | Freq: Every day | ORAL | 3 refills | Status: AC

## 2023-05-14 NOTE — Patient Instructions (Signed)
    Follow-Up: At [facility], you and your health needs are our priority.  As part of our continuing mission to provide you with exceptional heart care, we have created designated Provider Care Teams.  These Care Teams include your primary Cardiologist (physician) and Advanced Practice Providers (APPs -  Physician Assistants and Nurse Practitioners) who all work together to provide you with the care you need, when you need it.   Your next appointment:   12 month(s)  Provider:   Olga Millers, MD

## 2023-05-14 NOTE — Telephone Encounter (Signed)
Prescription refill request for Eliquis received. Indication:aflutter Last office visit:4/24 ZOX:WRUEA labs Age: 72 Weight:114.8  kg  Prescription refilled

## 2023-05-15 LAB — COMPREHENSIVE METABOLIC PANEL
ALT: 56 [IU]/L — ABNORMAL HIGH (ref 0–44)
AST: 35 [IU]/L (ref 0–40)
Albumin: 4.5 g/dL (ref 3.9–4.9)
Alkaline Phosphatase: 60 [IU]/L (ref 44–121)
BUN/Creatinine Ratio: 14 (ref 10–24)
BUN: 14 mg/dL (ref 8–27)
Bilirubin Total: 0.7 mg/dL (ref 0.0–1.2)
CO2: 20 mmol/L (ref 20–29)
Calcium: 9.1 mg/dL (ref 8.6–10.2)
Chloride: 102 mmol/L (ref 96–106)
Creatinine, Ser: 1 mg/dL (ref 0.76–1.27)
Globulin, Total: 2 g/dL (ref 1.5–4.5)
Glucose: 108 mg/dL — ABNORMAL HIGH (ref 70–99)
Potassium: 4.4 mmol/L (ref 3.5–5.2)
Sodium: 138 mmol/L (ref 134–144)
Total Protein: 6.5 g/dL (ref 6.0–8.5)
eGFR: 81 mL/min/{1.73_m2} (ref >59)

## 2023-05-15 LAB — CBC
Hematocrit: 42.8 % (ref 37.5–51.0)
Hemoglobin: 14.3 g/dL (ref 13.0–17.7)
MCH: 31.1 pg (ref 26.6–33.0)
MCHC: 33.4 g/dL (ref 31.5–35.7)
MCV: 93 fL (ref 79–97)
Platelets: 187 10*3/uL (ref 150–450)
RBC: 4.6 x10E6/uL (ref 4.14–5.80)
RDW: 12 % (ref 11.6–15.4)
WBC: 5.5 10*3/uL (ref 3.4–10.8)

## 2023-05-15 LAB — LIPID PANEL
Chol/HDL Ratio: 2.8 {ratio} (ref 0.0–5.0)
Cholesterol, Total: 135 mg/dL (ref 100–199)
HDL: 48 mg/dL (ref >39)
LDL Chol Calc (NIH): 62 mg/dL (ref 0–99)
Triglycerides: 146 mg/dL (ref 0–149)
VLDL Cholesterol Cal: 25 mg/dL (ref 5–40)

## 2023-05-21 ENCOUNTER — Encounter: Payer: Self-pay | Admitting: Family Medicine

## 2023-05-21 ENCOUNTER — Encounter: Payer: PPO | Admitting: *Deleted

## 2023-05-21 ENCOUNTER — Ambulatory Visit: Payer: PPO | Admitting: Family Medicine

## 2023-05-21 VITALS — BP 128/70 | HR 61 | Temp 98.1°F | Resp 18 | Ht 68.0 in | Wt 253.8 lb

## 2023-05-21 DIAGNOSIS — E782 Mixed hyperlipidemia: Secondary | ICD-10-CM | POA: Diagnosis not present

## 2023-05-21 DIAGNOSIS — I10 Essential (primary) hypertension: Secondary | ICD-10-CM | POA: Diagnosis not present

## 2023-05-21 DIAGNOSIS — Z0000 Encounter for general adult medical examination without abnormal findings: Secondary | ICD-10-CM

## 2023-05-21 DIAGNOSIS — E7800 Pure hypercholesterolemia, unspecified: Secondary | ICD-10-CM

## 2023-05-21 DIAGNOSIS — M150 Primary generalized (osteo)arthritis: Secondary | ICD-10-CM | POA: Diagnosis not present

## 2023-05-21 DIAGNOSIS — Z860100 Personal history of colon polyps, unspecified: Secondary | ICD-10-CM

## 2023-05-21 DIAGNOSIS — I2510 Atherosclerotic heart disease of native coronary artery without angina pectoris: Secondary | ICD-10-CM

## 2023-05-21 DIAGNOSIS — R252 Cramp and spasm: Secondary | ICD-10-CM

## 2023-05-21 DIAGNOSIS — I4819 Other persistent atrial fibrillation: Secondary | ICD-10-CM

## 2023-05-21 DIAGNOSIS — R351 Nocturia: Secondary | ICD-10-CM

## 2023-05-21 LAB — VITAMIN B12: Vitamin B-12: 678 pg/mL (ref 211–911)

## 2023-05-21 LAB — VITAMIN D 25 HYDROXY (VIT D DEFICIENCY, FRACTURES): VITD: 32.5 ng/mL (ref 30.00–100.00)

## 2023-05-21 LAB — PSA: PSA: 1.37 ng/mL (ref 0.10–4.00)

## 2023-05-21 LAB — MAGNESIUM: Magnesium: 2.2 mg/dL (ref 1.5–2.5)

## 2023-05-21 MED ORDER — MELOXICAM 15 MG PO TABS: 15.0000 mg | ORAL_TABLET | Freq: Every day | ORAL | 2 refills | Status: DC

## 2023-05-21 NOTE — Progress Notes (Signed)
 Pt was actually a Welcome to [employer].This encounter was created in error - please disregard.

## 2023-05-21 NOTE — Assessment & Plan Note (Signed)
 Ghm utd Check labs  See AVS Health Maintenance  Topic Date Due   Colonoscopy  05/07/2023   COVID-19 Vaccine (2 - 2024-25 season) 06/06/2023 (Originally 01/20/2023)   Medicare Annual Wellness (AWV)  05/20/2024   DTaP/Tdap/Td (4 - Td or Tdap) 03/20/2032   Pneumonia Vaccine 69+ Years old  Completed   INFLUENZA VACCINE  Completed   Hepatitis C Screening  Completed   Zoster Vaccines- Shingrix   Completed   HPV VACCINES  Aged Out

## 2023-05-21 NOTE — Assessment & Plan Note (Signed)
On eliquis  °Per cardiology °

## 2023-05-21 NOTE — Assessment & Plan Note (Signed)
Tolerating statin, encouraged heart healthy diet, avoid trans fats, minimize simple carbs and saturated fats. Increase exercise as tolerated 

## 2023-05-21 NOTE — Progress Notes (Signed)
 Subjective:    [name] is a 72 y.o. male who presents for a Welcome to Medicare exam.    The patient, with a history of joint pain, hiatal hernia, and a recent wart removal, presents for a routine check-up and to discuss a few health concerns. He reports occasional severe joint pain, for which he takes ibuprofen  sparingly due to potential interactions with his other medications. The patient also mentions a need to schedule a colonoscopy due to a history of polyps, and it is noted that he is due for this procedure.  The patient recently retired and transitioned from [employer] to [employer]. He reports occasional cramping in his thighs, which he suspects may be related to electrolyte imbalances. He mentions that he has been taking pantoprazole  for stomach acid related to his hiatal hernia, but not on a daily basis. The patient also reports a recent wart removal, which was somewhat painful but is healing well.  The patient is active, attending the gym three times a week, and has been managing his health well. He reports no significant changes in his health status, no new medications, and no new or worsening symptoms. He expresses some concern about muscle pain in his arm, which he believes may be related to weight lifting at the gym. Discussed the use of AI scribe software for clinical note transcription with the patient, who gave verbal consent to proceed.  History of Present Illness   The patient, with a history of joint pain, hiatal hernia, and a recent wart removal, presents for a routine check-up and to discuss a few health concerns. He reports occasional severe joint pain, for which he takes ibuprofen  sparingly due to potential interactions with his other medications. The patient also mentions a need to schedule a colonoscopy due to a history of polyps, and it is noted that he is due for this procedure.  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 He reports occasional cramping in his thighs, which he suspects may be related to electrolyte imbalances. He mentions that he has been taking pantoprazole  for stomach acid related to his hiatal hernia, but not on a daily basis. The patient also reports a recent wart removal, which was somewhat painful but is healing well.  The patient is active, attending the gym three times a week, and has been managing his health well. He reports no significant changes in his health status, no new medications, and no new or worsening symptoms. He expresses some concern about muscle pain in his arm, which he believes may be related to weight lifting at the gym.              Objective:    Today's Vitals   05/21/23 0921  BP: 128/70  Pulse: 61  Resp: 18  Temp: 98.1 F (36.7 C)  TempSrc: Oral  SpO2: 95%  Weight: 253 lb 12.8 oz (115.1 kg)  Height: 5' 8 (1.727 m)   Body mass index is 38.59 kg/m.  Medications Outpatient Encounter Medications as of 05/21/2023  Medication Sig   amLODipine  (NORVASC ) 5 MG tablet Take 1 tablet (5 mg total) by mouth daily.   amoxicillin  (AMOXIL ) 500 MG capsule Take 2,000 mg by mouth See admin instructions. Take 2000 mg by mouth 1 hour dental procedure   apixaban  (ELIQUIS ) 5 MG TABS tablet Take 1 tablet (5 mg total) by mouth 2 (two) times daily.   carvedilol  (COREG ) 12.5 MG tablet Take 1 tablet (12.5  mg total) by mouth 2 (two) times daily with a meal.   Cholecalciferol (VITAMIN D3) 50 MCG (2000 UT) capsule    Evolocumab  (REPATHA  SURECLICK) 140 MG/ML SOAJ INJECT 140 MG INTO THE SKIN EVERY 14 (FOURTEEN) DAYS.   Multiple Vitamins-Minerals (CENTRUM SILVER PO) Take 1 tablet by mouth daily.   pantoprazole  (PROTONIX ) 40 MG tablet Take 1 tablet (40 mg total) by mouth daily.   Saw Palmetto 500 MG CAPS Take 500 mg by mouth every other day. 585 mg   telmisartan  (MICARDIS ) 80 MG tablet TAKE 1 TABLET BY MOUTH EVERY DAY   Vitamin E 180 MG  (400 UNIT) CAPS Take 400 Units by mouth daily.   [DISCONTINUED] meloxicam  (MOBIC ) 15 MG tablet TAKE 1 TABLET (15 MG TOTAL) BY MOUTH DAILY.   meloxicam  (MOBIC ) 15 MG tablet Take 1 tablet (15 mg total) by mouth daily.   No facility-administered encounter medications on file as of 05/21/2023.     History: Past Medical History:  Diagnosis Date   Allergy    Atrial flutter (HCC)    s/p ablation   CAD (coronary artery disease)    cath 06/2007 40% LM   CVD (cerebrovascular disease)    59% right ICA, 49% left ICA; h/oTIA; fu study 8/11 with normal carotids   Dysrhythmia    Gait abnormality    Heart murmur    History of bronchitis    History of colonoscopy    Horseshoe kidney    Hyperlipidemia    Hypertension    corrected with medication, diet, exercise   MR (mitral regurgitation)    severe; mitral valve repair in march 2009   MVP (mitral valve prolapse)    Osteoarthritis    both knees   Shortness of breath dyspnea    increased exertion    Past Surgical History:  Procedure Laterality Date   A-FLUTTER ABLATION N/A 01/29/2018   Procedure: A-FLUTTER ABLATION;  Surgeon: Inocencio Soyla Lunger, MD;  Location: [facility];  Service: Cardiovascular;  Laterality: N/A;   ATRIAL FIBRILLATION ABLATION N/A 01/21/2019   Procedure: ATRIAL FIBRILLATION ABLATION;  Surgeon: Inocencio Soyla Lunger, MD;  Location: [facility];  Service: Cardiovascular;  Laterality: N/A;   ATRIAL FIBRILLATION ABLATION N/A 05/30/2021   Procedure: ATRIAL FIBRILLATION ABLATION;  Surgeon: Inocencio Soyla Lunger, MD;  Location: [facility];  Service: Cardiovascular;  Laterality: N/A;   BACK SURGERY     secondary to ruptured disc   CARDIAC CATHETERIZATION     CARDIOVERSION N/A 09/23/2017   Procedure: CARDIOVERSION;  Surgeon: Pietro Redell RAMAN, MD;  Location: [facility] ENDOSCOPY;  Service: Cardiovascular;  Laterality: N/A;   heart ablation     JOINT REPLACEMENT     bilateral knees   MITRAL VALVE REPAIR   06/22/2007   SPINE SURGERY     TEE WITHOUT CARDIOVERSION N/A 09/23/2017   Procedure: TRANSESOPHAGEAL ECHOCARDIOGRAM (TEE);  Surgeon: Pietro Redell RAMAN, MD;  Location: [facility] ENDOSCOPY;  Service: Cardiovascular;  Laterality: N/A;   TONSILLECTOMY  05/21/1958   TOTAL KNEE ARTHROPLASTY Bilateral 03/09/2015   Procedure: TOTAL KNEE BILATERAL;  Surgeon: Dempsey Moan, MD;  Location: [facility];  Service: Orthopedics;  Laterality: Bilateral;    Family History  Problem Relation Age of Onset   Liver disease Father        cancer   Hyperlipidemia Father    Liver cancer Father    Diabetes Other    Colitis Daughter    Crohn's disease Daughter    Colon cancer Neg Hx  Esophageal cancer Neg Hx    Rectal cancer Neg Hx    Stomach cancer Neg Hx    Social History   Occupational History   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   Occupation: retired  Tobacco Use   Smoking status: Never   Smokeless tobacco: Never  Vaping Use   Vaping status: Never Used  Substance and Sexual Activity   Alcohol use: Not Currently    Comment: occasional 1-2 wine drinks a month   Drug use: No   Sexual activity: Not Currently    Partners: Female    Tobacco Counseling Counseling given: Not Answered   Immunizations and Health Maintenance Immunization History  Administered Date(s) Administered   Fluad Quad(high Dose 65+) 02/20/2019, 02/20/2022   Fluad Trivalent(High Dose 65+) 05/01/2023   Influenza Split 02/26/2012, 02/24/2013   Influenza Whole 03/22/2008, 04/29/2009, 04/19/2010   Influenza,inj,Quad PF,6+ Mos 03/09/2014, 03/15/2015, 04/03/2016, 05/30/2017, 03/28/2018   Influenza-Unspecified 03/25/2021   PFIZER(Purple Top)SARS-COV-2 Vaccination 03/18/2020   PNEUMOCOCCAL CONJUGATE-20 02/20/2022   Pneumococcal Conjugate-13 06/29/2019   Pneumococcal Polysaccharide-23 12/03/2007, 02/24/2013   Td 07/28/2001   Tdap 02/26/2012, 03/20/2022   Zoster Recombinant(Shingrix ) 10/01/2016, 01/18/2017   Zoster,  Live 09/17/2014   Health Maintenance Due  Topic Date Due   Colonoscopy  05/07/2023    Activities of Daily Living    05/21/2023    9:41 AM 05/14/2023   11:39 AM  In your present state of health, do you have any difficulty performing the following activities:  Hearing? 0 0  Vision? 0 0  Difficulty concentrating or making decisions? 0 0  Walking or climbing stairs? 0 0  Dressing or bathing? 0 0  Doing errands, shopping? 0 0  Preparing Food and eating ?  N  Using the Toilet?  N  In the past six months, have you accidently leaked urine?  N  Do you have problems with loss of bowel control?  N  Managing your Medications?  N  Managing your Finances?  N  Housekeeping or managing your Housekeeping?  N    Physical Exam   BP 128/70 (BP Location: Left Arm, Cuff Size: Large)   Pulse 61   Temp 98.1 F (36.7 C) (Oral)   Resp 18   Ht 5' 8 (1.727 m)   Wt 253 lb 12.8 oz (115.1 kg)   SpO2 95%   BMI 38.59 kg/m  General appearance: alert, cooperative, appears stated age, and no distress Head: Normocephalic, without obvious abnormality, atraumatic Eyes: conjunctivae/corneas clear. PERRL, EOM's intact. Fundi benign. Ears: normal TM's and external ear canals both ears Nose: Nares normal. Septum midline. Mucosa normal. No drainage or sinus tenderness. Throat: lips, mucosa, and tongue normal; teeth and gums normal Neck: no adenopathy, no carotid bruit, no JVD, supple, symmetrical, trachea midline, and thyroid  not enlarged, symmetric, no tenderness/mass/nodules Back: negative, symmetric, no curvature. ROM normal. No CVA tenderness. Lungs: clear to auscultation bilaterally Chest wall: no tenderness Heart: regular rate and rhythm, S1, S2 normal, no murmur, click, rub or gallop Abdomen: soft, non-tender; bowel sounds normal; no masses,  no organomegaly Extremities: extremities normal, atraumatic, no cyanosis or edema Pulses: 2+ and symmetric Skin: Skin color, texture, turgor normal. No rashes  or lesions Lymph nodes: Cervical, supraclavicular, and axillary nodes normal.   Advanced Directives:    Pt has POA and living will---- copy requested      EKG:  unchanged from previous tracings-- 08/2022     Assessment:    This is a routine  wellness  examination for this patient .   Vision/Hearing screen Vision Screening   Right eye Left eye Both eyes  Without correction     With correction 20/40 20/20 20/20   Hearing Screening - Comments:: Normal whisper    Goals      Blood Pressure < 130/80         Depression Screen    05/21/2023    9:25 AM 02/20/2022   10:11 AM 06/29/2019   10:03 AM 04/03/2016    9:24 AM  PHQ 2/9 Scores  PHQ - 2 Score 0 0 0 0     Fall Risk    05/21/2023    9:25 AM  Fall Risk   Falls in the past year? 0  Number falls in past yr: 0  Injury with Fall? 0  Follow up Falls evaluation completed    Cognitive Function    05/21/2023    9:42 AM  MMSE - Mini Mental State Exam  Orientation to time 5  Orientation to Place 5  Registration 3  Attention/ Calculation 5  Recall 3  Language- name 2 objects 2  Language- repeat 1  Language- follow 3 step command 3  Language- read & follow direction 1  Write a sentence 1  Copy design 1  Total score 30        Patient Care Team: Antonio Meth, Jamee SAUNDERS, DO as PCP - General (Family Medicine) Pietro, Redell RAMAN, MD as PCP - Cardiology (Cardiology) Inocencio Soyla Lunger, MD as PCP - Electrophysiology (Cardiology) Louis Shove, MD as Consulting Physician (Neurosurgery) Pietro, Redell RAMAN, MD as Consulting Physician (Cardiology) Melodi Lerner, MD as Consulting Physician (Orthopedic Surgery)     Plan:     I have personally reviewed and noted the following in the patient's chart:   Medical and social history Use of alcohol, tobacco or illicit drugs  Current medications and supplements Functional ability and status Nutritional status Physical activity Advanced directives List of other  physicians Hospitalizations, surgeries, and ER visits in previous 12 months Vitals Screenings to include cognitive, depression, and falls Referrals and appointments  In addition, I have reviewed and discussed with patient certain preventive protocols, quality metrics, and best practice recommendations. A written personalized care plan for preventive services as well as general preventive health recommendations were provided to patient. Assessment and Plan    Joint Pain   Intermittent joint pain presents with severe episodes. He uses medication sparingly due to concerns about interactions and side effects, although a pharmacist confirmed occasional use is acceptable. He prefers to avoid daily medication to prevent potential interference with other medications. We will refill joint pain medication as needed.  Muscle Cramps   He experiences occasional thigh cramps, potentially linked to electrolyte imbalances. Recent blood tests indicated normal electrolytes, except for slightly elevated liver function tests (ALT/AST). He is aware pantoprazole  may lower magnesium levels and prefers dietary adjustments over regular medication for cramp management. We will order a magnesium level test and check PSA levels.  Hiatal Hernia   He has a 5 cm hiatal hernia and uses pantoprazole  as needed for acid management. He has been advised to take pantoprazole  only when necessary or before consuming foods that may cause discomfort, preferring to avoid daily use. Pantoprazole  will continue as needed.  Colonoscopy Screening   He is due for a colonoscopy, having a history of polyps, and requests assistance in expediting the appointment. He understands the importance of regular screenings due to his history. We will contact gastroenterology to  expedite colonoscopy scheduling.  General Health Maintenance   He is overdue for a 'Welcome to Medicare' physical but recently received a flu shot and does not need pneumonia  shots. His memory is reported as good, and he exercises three times a week at the [facility], preferring to maintain an active lifestyle post-retirement. We will perform an EKG, check vision, and conduct necessary labs.  Follow-up   We will inform him that his memory is good and no additional memory testing is needed. We will follow up with gastroenterology for colonoscopy scheduling.          Martrell Eguia R Lowne Chase, DO 05/21/2023

## 2023-05-21 NOTE — Assessment & Plan Note (Signed)
 Sees cardio

## 2023-06-07 ENCOUNTER — Telehealth: Payer: Self-pay

## 2023-06-07 ENCOUNTER — Ambulatory Visit: Payer: PPO | Admitting: Physician Assistant

## 2023-06-07 ENCOUNTER — Encounter: Payer: Self-pay | Admitting: Physician Assistant

## 2023-06-07 VITALS — BP 114/62 | HR 65 | Ht 70.0 in | Wt 253.2 lb

## 2023-06-07 DIAGNOSIS — Z860101 Personal history of adenomatous and serrated colon polyps: Secondary | ICD-10-CM | POA: Diagnosis not present

## 2023-06-07 DIAGNOSIS — Z952 Presence of prosthetic heart valve: Secondary | ICD-10-CM

## 2023-06-07 DIAGNOSIS — I2510 Atherosclerotic heart disease of native coronary artery without angina pectoris: Secondary | ICD-10-CM

## 2023-06-07 DIAGNOSIS — Z7901 Long term (current) use of anticoagulants: Secondary | ICD-10-CM

## 2023-06-07 DIAGNOSIS — I679 Cerebrovascular disease, unspecified: Secondary | ICD-10-CM

## 2023-06-07 MED ORDER — SUFLAVE 178.7 G PO SOLR: 1.0000 | Freq: Once | ORAL | 0 refills | Status: AC

## 2023-06-07 NOTE — Patient Instructions (Signed)
You have been scheduled for a colonoscopy. Please follow written instructions given to you at your visit today.   Please pick up your prep supplies at the pharmacy within the next 1-3 days.  If you use inhalers (even only as needed), please bring them with you on the day of your procedure.  DO NOT TAKE 7 DAYS PRIOR TO TEST- Trulicity (dulaglutide) Ozempic, Wegovy (semaglutide) Mounjaro (tirzepatide) Bydureon Bcise (exanatide extended release)  DO NOT TAKE 1 DAY PRIOR TO YOUR TEST Rybelsus (semaglutide) Adlyxin (lixisenatide) Victoza (liraglutide) Byetta (exanatide) ___________________________________________________________________________  [name] will receive your bowel preparation through [facility], which ensures the lowest copay and home delivery, with outreach via text or call from an [phone] number. Please respond promptly to avoid rescheduling. If you are interested in alternative options or have any questions please contact them at [phone]  Your Provider Has Sent Your Bowel Prep Regimen To [facility] What to expect. [facility] will contact you to verify your information and collect your copay, if applicable. Enjoy the comfort of your home while we deliver your prescription to you, free of any shipping charges. Fast, FREE delivery or shipping. [facility] accepts all major insurance benefits and applies discounts & coupons  Have additional questions? [facility]'s patient care team is always here to help.  Chat: [email or web address] Call: [phone] Email: [email or web address] NCPDP: [id number] How will we contact you? Welcome Phone call  a Welcome text and a Checkout link in a text Texts you receive from [phone] Are Not Spam.   *To set up delivery, you must complete the checkout process via link or speak to one of our patient care representatives. If we are unable to reach you, your prescription may be  delayed.  _______________________________________________________  If your blood pressure at your visit was 140/90 or greater, please contact your primary care physician to follow up on this.  _______________________________________________________  If you are age 65 or older, your body mass index should be between 23-30. Your Body mass index is 36.34 kg/m. If this is out of the aforementioned range listed, please consider follow up with your Primary Care Provider.  If you are age 27 or younger, your body mass index should be between 19-25. Your Body mass index is 36.34 kg/m. If this is out of the aformentioned range listed, please consider follow up with your Primary Care Provider.   ________________________________________________________  The [facility] providers would like to encourage you to use [facility] to communicate with providers for non-urgent requests or questions.  Due to long hold times on the telephone, sending your provider a message by [facility] may be a faster and more efficient way to get a response.  Please allow 48 business hours for a response.  Please remember that this is for non-urgent requests.  _______________________________________________________

## 2023-06-07 NOTE — Progress Notes (Signed)
Chief Complaint: Discuss colonoscopy on chronic anticoagulation  HPI:    [name] is a 73 year old male with a past medical history as listed below including a flutter, status post mitral valve repair, CAD, CVD and multiple others on Eliquis (05/18/2022 echo with LVEF 45-50%), known to Dr. Lavon House, who was referred to me by [name], [name], * for question of a colonoscopy.    05/06/2018 colonoscopy done for personal history of adenomatous polyps and the last colonoscopy greater than 5 years ago with one 5 mm polyp in the sigmoid colon and two 1-2 mm polyps in the transverse colon as well as diverticulosis in the sigmoid and descending colon with nonbleeding internal hemorrhoids.  Repeat recommended in 3 to 5 years based on path.  Pathology showed mixture of tubular adenoma and hyperplastic polyp.  Repeat recommended in 5 years.     07/04/2022 EGD for epigastric pain dyspepsia with normal esophagus, 5 cm hiatal hernia and gastritis with normal duodenum.  Patient told to use Pantoprazole 40 mg daily.    01/23/23 office visit with Dr. Lavon House to discuss GERD.  He described occasional abdominal cramping and frequent burping and gas on Pantoprazole 40 mg as needed.  At that time noted mild persistent elevation in transaminases, again recommended continued monitoring every 6 months, ultrasound with elastography was negative for significant fibrosis.  Continued on Pantoprazole 40 mg as needed and IBgard 1 capsule 3 times daily for IBS symptoms with bloating.    05/10/2023 patient saw cardiology at that time patient had some dyspnea with more extreme activities but not with routine activities relieved with rest.  Not associated chest pain.  His Eliquis was continued.    Today, the patient tells me that he feels well.  No acute GI complaints or concerns.  Knows that he is due for his colonoscopy.  He has had to hold his Eliquis before for this procedure and it has not been a problem.  Has no trouble with  the bowel prep.  Does request either a Tuesday or Thursday in the morning.    Denies fever, chills, weight loss, change in bowel habits, abdominal pain or symptoms that awaken him from sleep.  Past Medical History:  Diagnosis Date   Allergy    Atrial flutter (HCC)    s/p ablation   CAD (coronary artery disease)    cath 06/2007 40% LM   CVD (cerebrovascular disease)    59% right ICA, 49% left ICA; h/oTIA; fu study 8/11 with normal carotids   Dysrhythmia    Gait abnormality    Heart murmur    History of bronchitis    History of colonoscopy    Horseshoe kidney    Hyperlipidemia    Hypertension    corrected with medication, diet, exercise   MR (mitral regurgitation)    severe; mitral valve repair in march 2009   MVP (mitral valve prolapse)    Osteoarthritis    both knees   Shortness of breath dyspnea    increased exertion     Past Surgical History:  Procedure Laterality Date   A-FLUTTER ABLATION N/A 01/29/2018   Procedure: A-FLUTTER ABLATION;  Surgeon: Regan Lemming, MD;  Location: [facility] INVASIVE CV LAB;  Service: Cardiovascular;  Laterality: N/A;   ATRIAL FIBRILLATION ABLATION N/A 01/21/2019   Procedure: ATRIAL FIBRILLATION ABLATION;  Surgeon: Regan Lemming, MD;  Location: [facility] INVASIVE CV LAB;  Service: Cardiovascular;  Laterality: N/A;   ATRIAL FIBRILLATION ABLATION N/A 05/30/2021   Procedure: ATRIAL FIBRILLATION  ABLATION;  Surgeon: Regan Lemming, MD;  Location: [facility] INVASIVE CV LAB;  Service: Cardiovascular;  Laterality: N/A;   BACK SURGERY     secondary to ruptured disc   CARDIAC CATHETERIZATION     CARDIOVERSION N/A 09/23/2017   Procedure: CARDIOVERSION;  Surgeon: Lewayne Bunting, MD;  Location: [facility] ENDOSCOPY;  Service: Cardiovascular;  Laterality: N/A;   heart ablation     JOINT REPLACEMENT     bilateral knees   MITRAL VALVE REPAIR  06/22/2007   SPINE SURGERY     TEE WITHOUT CARDIOVERSION N/A 09/23/2017   Procedure: TRANSESOPHAGEAL ECHOCARDIOGRAM  (TEE);  Surgeon: Lewayne Bunting, MD;  Location: [facility] ENDOSCOPY;  Service: Cardiovascular;  Laterality: N/A;   TONSILLECTOMY  05/21/1958   TOTAL KNEE ARTHROPLASTY Bilateral 03/09/2015   Procedure: TOTAL KNEE BILATERAL;  Surgeon: Ollen Gross, MD;  Location: [facility] ORS;  Service: Orthopedics;  Laterality: Bilateral;    Current Outpatient Medications  Medication Sig Dispense Refill   amLODipine (NORVASC) 5 MG tablet Take 1 tablet (5 mg total) by mouth daily. 90 tablet 3   amoxicillin (AMOXIL) 500 MG capsule Take 2,000 mg by mouth See admin instructions. Take 2000 mg by mouth 1 hour dental procedure     apixaban (ELIQUIS) 5 MG TABS tablet Take 1 tablet (5 mg total) by mouth 2 (two) times daily. 180 tablet 3   carvedilol (COREG) 12.5 MG tablet Take 1 tablet (12.5 mg total) by mouth 2 (two) times daily with a meal. 180 tablet 3   Cholecalciferol (VITAMIN D3) 50 MCG (2000 UT) capsule      Evolocumab (REPATHA SURECLICK) 140 MG/ML SOAJ INJECT 140 MG INTO THE SKIN EVERY 14 (FOURTEEN) DAYS. 2 mL 11   meloxicam (MOBIC) 15 MG tablet Take 1 tablet (15 mg total) by mouth daily. 30 tablet 2   Multiple Vitamins-Minerals (CENTRUM SILVER PO) Take 1 tablet by mouth daily.     pantoprazole (PROTONIX) 40 MG tablet Take 1 tablet (40 mg total) by mouth daily. 90 tablet 3   Saw Palmetto 500 MG CAPS Take 500 mg by mouth every other day. 585 mg     telmisartan (MICARDIS) 80 MG tablet TAKE 1 TABLET BY MOUTH EVERY DAY 90 tablet 3   Vitamin E 180 MG (400 UNIT) CAPS Take 400 Units by mouth daily.     No current facility-administered medications for this visit.    Allergies as of 06/07/2023 - Review Complete 05/21/2023  Allergen Reaction Noted   Praluent [alirocumab] Shortness Of Breath 05/30/2020   Statins Other (See Comments) 02/08/2014   Codeine Nausea Only    Tape Rash 03/17/2012    Family History  Problem Relation Age of Onset   Liver disease Father        cancer   Hyperlipidemia Father    Liver cancer  Father    Diabetes Other    Colitis Daughter    Crohn's disease Daughter    Colon cancer Neg Hx    Esophageal cancer Neg Hx    Rectal cancer Neg Hx    Stomach cancer Neg Hx     Social History   Socioeconomic History   Marital status: Married    Spouse name: Not on file   Number of children: 2   Years of education: Not on file   Highest education level: Not on file  Occupational History   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   Occupation: retired  Tobacco Use   Smoking status: Never  Smokeless tobacco: Never  Vaping Use   Vaping status: Never Used  Substance and Sexual Activity   Alcohol use: Not Currently    Comment: occasional 1-2 wine drinks a month   Drug use: No   Sexual activity: Not Currently    Partners: Female  Other Topics Concern   Not on file  Social History Narrative   Exercise- gym 3x a week   Social Drivers of Corporate investment banker Strain: Not on file  Food Insecurity: No Food Insecurity (05/21/2023)   Hunger Vital Sign    Worried About Running Out of Food in the Last Year: Never true    Ran Out of Food in the Last Year: Never true  Transportation Needs: No Transportation Needs (05/21/2023)   PRAPARE - Administrator, Civil Service (Medical): No    Lack of Transportation (Non-Medical): No  Physical Activity: Not on file  Stress: Not on file  Social Connections: Unknown (05/21/2023)   Social Connection and Isolation Panel [NHANES]    Frequency of Communication with Friends and Family: Not on file    Frequency of Social Gatherings with Friends and Family: Not on file    Attends Religious Services: More than 4 times per year    Active Member of [employer] or Organizations: Not on file    Attends Banker Meetings: More than 4 times per year    Marital Status: Married  Catering manager Violence: Not At Risk (05/21/2023)   Humiliation, Afraid, Rape, and Kick questionnaire    Fear of Current or Ex-Partner:  No    Emotionally Abused: No    Physically Abused: No    Sexually Abused: No    Review of Systems:    Constitutional: No weight loss, fever or chills Cardiovascular: No chest pain  Respiratory: mild DOE  Gastrointestinal: See HPI and otherwise negative   Physical Exam:  Vital signs: BP 114/62   Pulse 65   Ht 5\' 10"  (1.778 m)   Wt 253 lb 4 oz (114.9 kg)   SpO2 96%   BMI 36.34 kg/m    Constitutional:   Pleasant overweight Caucasian male appears to be in NAD, Well developed, Well nourished, alert and cooperative Respiratory: Respirations even and unlabored. Lungs clear to auscultation bilaterally.   No wheezes, crackles, or rhonchi.  Cardiovascular: Normal S1, S2. No MRG. Regular rate and rhythm. No peripheral edema, cyanosis or pallor.  Gastrointestinal:  Soft, nondistended, nontender. No rebound or guarding. Normal bowel sounds. No appreciable masses or hepatomegaly. Rectal:  Not performed.  Psychiatric: Oriented to person, place and time. Demonstrates good judgement and reason without abnormal affect or behaviors.  RELEVANT LABS AND IMAGING: CBC    Component Value Date/Time   WBC 5.5 05/14/2023 0828   WBC 7.4 06/26/2022 1446   RBC 4.60 05/14/2023 0828   RBC 4.77 06/26/2022 1446   HGB 14.3 05/14/2023 0828   HCT 42.8 05/14/2023 0828   PLT 187 05/14/2023 0828   MCV 93 05/14/2023 0828   MCH 31.1 05/14/2023 0828   MCH 31.1 01/29/2018 0710   MCHC 33.4 05/14/2023 0828   MCHC 34.4 06/26/2022 1446   RDW 12.0 05/14/2023 0828   LYMPHSABS 1.7 06/26/2022 1446   MONOABS 0.8 06/26/2022 1446   EOSABS 0.3 06/26/2022 1446   BASOSABS 0.0 06/26/2022 1446    CMP     Component Value Date/Time   NA 138 05/14/2023 0828   K 4.4 05/14/2023 0828   CL 102 05/14/2023 0828  CO2 20 05/14/2023 0828   GLUCOSE 108 (H) 05/14/2023 0828   GLUCOSE 107 (H) 02/20/2022 1052   BUN 14 05/14/2023 0828   CREATININE 1.00 05/14/2023 0828   CREATININE 0.98 09/17/2014 1524   CALCIUM 9.1 05/14/2023  0828   PROT 6.5 05/14/2023 0828   ALBUMIN 4.5 05/14/2023 0828   AST 35 05/14/2023 0828   ALT 56 (H) 05/14/2023 0828   ALKPHOS 60 05/14/2023 0828   BILITOT 0.7 05/14/2023 0828   GFRNONAA 71 04/21/2020 0839   GFRNONAA 74 02/08/2014 0958   GFRAA 82 04/21/2020 0839   GFRAA 86 02/08/2014 0958    Assessment: 1.  History of adenomatous polyps: Last colonoscopy in 2019 with repeat recommended in 5 years, patient is a month overdue 2.  Chronic anticoagulation for history of mitral valve repair and CAD: On Eliquis  Plan: 1.  Scheduled the patient for a surveillance colonoscopy given his history of adenomatous polyps in the [facility] with Dr. Lavon House.  Per his request this is made on a Tuesday/Thursday in the morning.  Did provide the patient with a detailed list of risks for the procedure and he agrees to proceed. Patient is appropriate for endoscopic procedure(s) in the ambulatory ([facility]) setting.  2.  Patient advised to hold his Eliquis for 2 days prior to time of procedure.  We will communicate with his prescribing physician to ensure this is acceptable for him. 3.  Patient to follow in clinic per recommendations after time of colonoscopy.  Hyacinth Meeker, PA-C [facility] 06/07/2023, 1:28 PM  Cc: [name], [name], *

## 2023-06-07 NOTE — Telephone Encounter (Signed)
Pharmacy please advise on holding Eliquis prior to colonoscopy scheduled for 08/15/2023. Thank you.

## 2023-06-07 NOTE — Telephone Encounter (Signed)
[facility] Pre-operative Risk Assessment     Request for surgical clearance:     Endoscopy Procedure  What type of surgery is being performed?     colonoscopy  When is this surgery scheduled?     08/15/23  What type of clearance is required ?   Pharmacy  Are there any medications that need to be held prior to surgery and how long? Eliquis x 2 days  Practice name and name of physician performing surgery?      [facility]  What is your office phone and fax number?      Phone- [phone]  Fax- [phone]  Anesthesia type (None, local, MAC, general) ?       MAC   Please route your response to Jovita Kussmaul, CMA

## 2023-06-12 ENCOUNTER — Telehealth: Payer: Self-pay

## 2023-06-12 NOTE — Telephone Encounter (Signed)
Patient with diagnosis of atrial fibrillation on Eliquis for anticoagulation.    What type of surgery is being performed?     colonoscopy  When is this surgery scheduled?     08/15/23    CHA2DS2-VASc Score = 4   This indicates a 4.8% annual risk of stroke. The patient's score is based upon: CHF History: 1 HTN History: 1 Diabetes History: 0 Stroke History: 0 Vascular Disease History: 1 Age Score: 1 Gender Score: 0    CrCl 112 Platelet count 170  Per office protocol, patient can hold Eliquis for 2 days prior to procedure.   Patient will not need bridging with Lovenox (enoxaparin) around procedure.  **This guidance is not considered finalized until pre-operative APP has relayed final recommendations.**

## 2023-06-12 NOTE — Telephone Encounter (Signed)
Lm on vm that per cardiology, patient could hold his Eliquis for 2 days prior to his procedure and would not require a Lovenox bridge.  Asked that he call us back to confirm that he got this information.

## 2023-06-12 NOTE — Telephone Encounter (Signed)
Patient returned call

## 2023-06-12 NOTE — Telephone Encounter (Signed)
Spoke to patient and clarified that he knew to hold his Eliquis for 2 days prior to the procedure.

## 2023-06-12 NOTE — Telephone Encounter (Signed)
   Patient Name: [name]  DOB: August 05, 1950 MRN: [id number]  Primary Cardiologist: Olga Millers, MD  Clinical pharmacists have reviewed the patient's past medical history, labs, and current medications as part of preoperative protocol coverage. The following recommendations have been made:  Per office protocol, patient can hold Eliquis for 2 days prior to procedure.   Patient will not need bridging with Lovenox (enoxaparin) around procedure.   I will route this recommendation to the requesting party via Epic fax function and remove from pre-op pool.  Please call with questions.  Napoleon Form, Leodis Rains, NP 06/12/2023, 9:54 AM

## 2023-06-13 NOTE — Telephone Encounter (Signed)
Informed patient to hold Eliquis 2 days prior to his procedure. Patient verbalized understanding. 

## 2023-06-28 ENCOUNTER — Telehealth: Payer: Self-pay

## 2023-06-28 NOTE — Telephone Encounter (Signed)
 Copied from CRM [id number]. Topic: General - Other >> Jun 28, 2023 11:58 AM Russell PARAS wrote: Reason for CRM: Patient is returning call from Amber concerning his AWV not being completed. He reports speaking with her on 05/21/2023, when he was seen for his physical with his provider and was under the impression the AWV had been completed. Contacted CAL who advised to schedule the AWV; however, patient would like to speak with Amber before scheduling. CB# [phone], he is avail all day today. On Monday he is avail after 12 PM and Tues he is avail all day.

## 2023-06-28 NOTE — Telephone Encounter (Signed)
 Scheduled AWVI for 05/26/24.

## 2023-07-02 ENCOUNTER — Telehealth: Payer: Self-pay | Admitting: Pharmacy Technician

## 2023-07-02 ENCOUNTER — Encounter: Payer: Self-pay | Admitting: Cardiology

## 2023-07-02 ENCOUNTER — Other Ambulatory Visit (HOSPITAL_COMMUNITY): Payer: Self-pay

## 2023-07-02 NOTE — Telephone Encounter (Signed)
Pharmacy Patient Advocate Encounter   Received notification from Physician's Office that prior authorization for repatha is required/requested.   Insurance verification completed.   The patient is insured through [facility] ADVANTAGE/RX ADVANCE .   Per test claim: PA required; PA submitted to above mentioned insurance via CoverMyMeds Key/confirmation #/EOC [facility] Status is pending

## 2023-07-03 NOTE — Telephone Encounter (Signed)
Called and spoke with [name] at [phone] to give her the Repatha information on patient' chart as well as Dr Waunita Schooner NPI number.  She states the PA can be processed now that all the missing information has been updated. They will make a determination and notify the contact person on the PA form.

## 2023-07-03 NOTE — Telephone Encounter (Signed)
HTA-  calling needing need dosage amount and supply please call [phone]

## 2023-08-07 ENCOUNTER — Encounter: Payer: Self-pay | Admitting: Gastroenterology

## 2023-08-11 ENCOUNTER — Encounter: Payer: Self-pay | Admitting: Certified Registered Nurse Anesthetist

## 2023-08-14 ENCOUNTER — Other Ambulatory Visit: Payer: Self-pay

## 2023-08-14 ENCOUNTER — Other Ambulatory Visit (INDEPENDENT_AMBULATORY_CARE_PROVIDER_SITE_OTHER)

## 2023-08-14 DIAGNOSIS — K760 Fatty (change of) liver, not elsewhere classified: Secondary | ICD-10-CM

## 2023-08-14 DIAGNOSIS — R945 Abnormal results of liver function studies: Secondary | ICD-10-CM

## 2023-08-14 LAB — HEPATIC FUNCTION PANEL
ALT: 68 U/L — ABNORMAL HIGH (ref 0–53)
AST: 40 U/L — ABNORMAL HIGH (ref 0–37)
Albumin: 4.7 g/dL (ref 3.5–5.2)
Alkaline Phosphatase: 55 U/L (ref 39–117)
Bilirubin, Direct: 0.2 mg/dL (ref 0.0–0.3)
Total Bilirubin: 1.3 mg/dL — ABNORMAL HIGH (ref 0.2–1.2)
Total Protein: 7.2 g/dL (ref 6.0–8.3)

## 2023-08-15 ENCOUNTER — Encounter: Payer: Self-pay | Admitting: Gastroenterology

## 2023-08-15 ENCOUNTER — Ambulatory Visit: Payer: PPO | Admitting: Gastroenterology

## 2023-08-15 VITALS — BP 142/82 | HR 47 | Temp 98.1°F | Resp 12 | Ht 70.0 in | Wt 253.4 lb

## 2023-08-15 DIAGNOSIS — I4892 Unspecified atrial flutter: Secondary | ICD-10-CM | POA: Diagnosis not present

## 2023-08-15 DIAGNOSIS — Z860101 Personal history of adenomatous and serrated colon polyps: Secondary | ICD-10-CM

## 2023-08-15 DIAGNOSIS — I10 Essential (primary) hypertension: Secondary | ICD-10-CM | POA: Diagnosis not present

## 2023-08-15 DIAGNOSIS — K648 Other hemorrhoids: Secondary | ICD-10-CM | POA: Diagnosis not present

## 2023-08-15 DIAGNOSIS — D123 Benign neoplasm of transverse colon: Secondary | ICD-10-CM

## 2023-08-15 DIAGNOSIS — K5730 Diverticulosis of large intestine without perforation or abscess without bleeding: Secondary | ICD-10-CM

## 2023-08-15 DIAGNOSIS — K644 Residual hemorrhoidal skin tags: Secondary | ICD-10-CM

## 2023-08-15 DIAGNOSIS — I2510 Atherosclerotic heart disease of native coronary artery without angina pectoris: Secondary | ICD-10-CM | POA: Diagnosis not present

## 2023-08-15 DIAGNOSIS — E785 Hyperlipidemia, unspecified: Secondary | ICD-10-CM | POA: Diagnosis not present

## 2023-08-15 DIAGNOSIS — Z1211 Encounter for screening for malignant neoplasm of colon: Secondary | ICD-10-CM | POA: Diagnosis not present

## 2023-08-15 DIAGNOSIS — D122 Benign neoplasm of ascending colon: Secondary | ICD-10-CM

## 2023-08-15 DIAGNOSIS — D124 Benign neoplasm of descending colon: Secondary | ICD-10-CM

## 2023-08-15 MED ORDER — SODIUM CHLORIDE 0.9 % IV SOLN: 500.0000 mL | Freq: Once | INTRAVENOUS | Status: DC

## 2023-08-15 NOTE — Progress Notes (Signed)
 [facility] History and Physical   Primary Care Physician:  Zola Button, Grayling Congress, DO   Reason for Procedure:  History of adenomatous colon polyps  Plan:    Surveillance colonoscopy with possible interventions as needed     HPI: [name] is a very pleasant 73 y.o. male here for surveillance colonoscopy. Denies any nausea, vomiting, abdominal pain, melena or bright red blood per rectum  The risks and benefits as well as alternatives of endoscopic procedure(s) have been discussed and reviewed. All questions answered. The patient agrees to proceed.    Past Medical History:  Diagnosis Date   Allergy    Atrial flutter (HCC)    s/p ablation   CAD (coronary artery disease)    cath 06/2007 40% LM   CVD (cerebrovascular disease)    59% right ICA, 49% left ICA; h/oTIA; fu study 8/11 with normal carotids   Dysrhythmia    Gait abnormality    Heart murmur    History of bronchitis    History of colonoscopy    Horseshoe kidney    Hyperlipidemia    Hypertension    corrected with medication, diet, exercise   MR (mitral regurgitation)    severe; mitral valve repair in march 2009   MVP (mitral valve prolapse)    Osteoarthritis    both knees   Shortness of breath dyspnea    increased exertion     Past Surgical History:  Procedure Laterality Date   A-FLUTTER ABLATION N/A 01/29/2018   Procedure: A-FLUTTER ABLATION;  Surgeon: Regan Lemming, MD;  Location: [facility];  Service: Cardiovascular;  Laterality: N/A;   ATRIAL FIBRILLATION ABLATION N/A 01/21/2019   Procedure: ATRIAL FIBRILLATION ABLATION;  Surgeon: Regan Lemming, MD;  Location: [facility];  Service: Cardiovascular;  Laterality: N/A;   ATRIAL FIBRILLATION ABLATION N/A 05/30/2021   Procedure: ATRIAL FIBRILLATION ABLATION;  Surgeon: Regan Lemming, MD;  Location: [facility];  Service: Cardiovascular;  Laterality: N/A;   BACK SURGERY     secondary to ruptured disc    CARDIAC CATHETERIZATION     CARDIOVERSION N/A 09/23/2017   Procedure: CARDIOVERSION;  Surgeon: Lewayne Bunting, MD;  Location: [facility] ENDOSCOPY;  Service: Cardiovascular;  Laterality: N/A;   heart ablation     JOINT REPLACEMENT     bilateral knees   MITRAL VALVE REPAIR  06/22/2007   SPINE SURGERY     TEE WITHOUT CARDIOVERSION N/A 09/23/2017   Procedure: TRANSESOPHAGEAL ECHOCARDIOGRAM (TEE);  Surgeon: Lewayne Bunting, MD;  Location: [facility] ENDOSCOPY;  Service: Cardiovascular;  Laterality: N/A;   TONSILLECTOMY  05/21/1958   TOTAL KNEE ARTHROPLASTY Bilateral 03/09/2015   Procedure: TOTAL KNEE BILATERAL;  Surgeon: Ollen Gross, MD;  Location: [facility] ORS;  Service: Orthopedics;  Laterality: Bilateral;    Prior to Admission medications   Medication Sig Start Date End Date Taking? Authorizing Provider  amLODipine (NORVASC) 5 MG tablet Take 1 tablet (5 mg total) by mouth daily. 05/14/23  Yes Lewayne Bunting, MD  carvedilol (COREG) 12.5 MG tablet Take 1 tablet (12.5 mg total) by mouth 2 (two) times daily with a meal. 05/10/23  Yes Crenshaw, Madolyn Frieze, MD  Cholecalciferol (VITAMIN D3) 50 MCG (2000 UT) capsule Take 2,000 Units by mouth daily. 01/20/23  Yes [provider]  Multiple Vitamins-Minerals (CENTRUM SILVER PO) Take 1 tablet by mouth daily.   Yes [provider]  Saw Palmetto 500 MG CAPS Take 500 mg by mouth every other day. 585 mg  04/20/20  Yes [provider]  telmisartan (MICARDIS) 80 MG tablet TAKE 1 TABLET BY MOUTH EVERY DAY 03/04/23  Yes Lewayne Bunting, MD  Vitamin E 180 MG (400 UNIT) CAPS Take 400 Units by mouth daily. 02/18/21  Yes [provider]  amoxicillin (AMOXIL) 500 MG capsule Take 2,000 mg by mouth See admin instructions. Take 2000 mg by mouth 1 hour dental procedure 12/31/16   [provider]  apixaban (ELIQUIS) 5 MG TABS tablet Take 1 tablet (5 mg total) by mouth 2 (two) times daily. 05/14/23   Lewayne Bunting, MD  Evolocumab (REPATHA  SURECLICK) 140 MG/ML SOAJ INJECT 140 MG INTO THE SKIN EVERY 14 (FOURTEEN) DAYS. 10/23/22   Lewayne Bunting, MD  meloxicam (MOBIC) 15 MG tablet Take 1 tablet (15 mg total) by mouth daily. 05/21/23   Donato Schultz, DO  pantoprazole (PROTONIX) 40 MG tablet Take 1 tablet (40 mg total) by mouth daily. 07/04/22   Napoleon Form, MD    Current Outpatient Medications  Medication Sig Dispense Refill   amLODipine (NORVASC) 5 MG tablet Take 1 tablet (5 mg total) by mouth daily. 90 tablet 3   carvedilol (COREG) 12.5 MG tablet Take 1 tablet (12.5 mg total) by mouth 2 (two) times daily with a meal. 180 tablet 3   Cholecalciferol (VITAMIN D3) 50 MCG (2000 UT) capsule Take 2,000 Units by mouth daily.     Multiple Vitamins-Minerals (CENTRUM SILVER PO) Take 1 tablet by mouth daily.     Saw Palmetto 500 MG CAPS Take 500 mg by mouth every other day. 585 mg     telmisartan (MICARDIS) 80 MG tablet TAKE 1 TABLET BY MOUTH EVERY DAY 90 tablet 3   Vitamin E 180 MG (400 UNIT) CAPS Take 400 Units by mouth daily.     amoxicillin (AMOXIL) 500 MG capsule Take 2,000 mg by mouth See admin instructions. Take 2000 mg by mouth 1 hour dental procedure     apixaban (ELIQUIS) 5 MG TABS tablet Take 1 tablet (5 mg total) by mouth 2 (two) times daily. 180 tablet 3   Evolocumab (REPATHA SURECLICK) 140 MG/ML SOAJ INJECT 140 MG INTO THE SKIN EVERY 14 (FOURTEEN) DAYS. 2 mL 11   meloxicam (MOBIC) 15 MG tablet Take 1 tablet (15 mg total) by mouth daily. 30 tablet 2   pantoprazole (PROTONIX) 40 MG tablet Take 1 tablet (40 mg total) by mouth daily. 90 tablet 3   Current Facility-Administered Medications  Medication Dose Route Frequency Provider Last Rate Last Admin   0.9 %  sodium chloride infusion  500 mL Intravenous Once Napoleon Form, MD        Allergies as of 08/15/2023 - Review Complete 08/15/2023  Allergen Reaction Noted   Praluent [alirocumab] Shortness Of Breath 05/30/2020   Statins Other (See Comments)  02/08/2014   Codeine Nausea Only    Tape Rash 03/17/2012    Family History  Problem Relation Age of Onset   Liver disease Father        cancer   Hyperlipidemia Father    Liver cancer Father    Diabetes Other    Colitis Daughter    Crohn's disease Daughter    Colon cancer Neg Hx    Esophageal cancer Neg Hx    Rectal cancer Neg Hx    Stomach cancer Neg Hx     Social History   Socioeconomic History   Marital status: Married    Spouse name: Not on file  Number of children: 2   Years of education: Not on file   Highest education level: Not on file  Occupational History   Occupation: [employer]: works from home   Occupation: retired  Tobacco Use   Smoking status: Never   Smokeless tobacco: Never  Vaping Use   Vaping status: Never Used  Substance and Sexual Activity   Alcohol use: Not Currently    Comment: occasional 1-2 wine drinks a month   Drug use: No   Sexual activity: Not Currently    Partners: Female  Other Topics Concern   Not on file  Social History Narrative   Exercise- gym 3x a week   Social Drivers of Corporate investment banker Strain: Not on file  Food Insecurity: No Food Insecurity (05/21/2023)   Hunger Vital Sign    Worried About Running Out of Food in the Last Year: Never true    Ran Out of Food in the Last Year: Never true  Transportation Needs: No Transportation Needs (05/21/2023)   PRAPARE - Administrator, Civil Service (Medical): No    Lack of Transportation (Non-Medical): No  Physical Activity: Not on file  Stress: Not on file  Social Connections: Unknown (05/21/2023)   Social Connection and Isolation Panel [NHANES]    Frequency of Communication with Friends and Family: Not on file    Frequency of Social Gatherings with Friends and Family: Not on file    Attends Religious Services: More than 4 times per year    Active Member of [employer] or Organizations: Not on file    Attends Banker Meetings:  More than 4 times per year    Marital Status: Married  Catering manager Violence: Not At Risk (05/21/2023)   Humiliation, Afraid, Rape, and Kick questionnaire    Fear of Current or Ex-Partner: No    Emotionally Abused: No    Physically Abused: No    Sexually Abused: No    Review of Systems:  All other review of systems negative except as mentioned in the HPI.  Physical Exam: Vital signs in last 24 hours: BP (!) 141/87   Pulse 62   Temp 98.1 F (36.7 C) (Skin)   Resp 14   Ht 5\' 10"  (1.778 m)   Wt 253 lb 6.4 oz (114.9 kg)   SpO2 97%   BMI 36.36 kg/m  General:   Alert, NAD Lungs:  Clear .   Heart:  Regular rate and rhythm Abdomen:  Soft, nontender and nondistended. Neuro/Psych:  Alert and cooperative. Normal mood and affect. A and O x 3  Reviewed labs, radiology imaging, old records and pertinent past GI work up  Patient is appropriate for planned procedure(s) and anesthesia in an ambulatory setting   K. Scherry Ran , MD [phone]

## 2023-08-15 NOTE — Progress Notes (Signed)
 Pt's states no medical or surgical changes since previsit or office visit.

## 2023-08-15 NOTE — Progress Notes (Signed)
 Report given to PACU, vss

## 2023-08-15 NOTE — Patient Instructions (Addendum)
-  Handout on polyps, hemorrhoids and diverticulosis provided -await pathology results -repeat colonoscopy for surveillance in 3 years recommended  -Continue present medications -Appointment for steatohepatitis management (they will reach out to you) -Resume Eliquis at prior dose tomorrow    YOU HAD AN ENDOSCOPIC PROCEDURE TODAY AT THE [facility]:   Refer to the procedure report that was given to you for any specific questions about what was found during the examination.  If the procedure report does not answer your questions, please call your gastroenterologist to clarify.  If you requested that your care partner not be given the details of your procedure findings, then the procedure report has been included in a sealed envelope for you to review at your convenience later.  YOU SHOULD EXPECT: Some feelings of bloating in the abdomen. Passage of more gas than usual.  Walking can help get rid of the air that was put into your GI tract during the procedure and reduce the bloating. If you had a lower endoscopy (such as a colonoscopy or flexible sigmoidoscopy) you may notice spotting of blood in your stool or on the toilet paper. If you underwent a bowel prep for your procedure, you may not have a normal bowel movement for a few days.  Please Note:  You might notice some irritation and congestion in your nose or some drainage.  This is from the oxygen used during your procedure.  There is no need for concern and it should clear up in a day or so.  SYMPTOMS TO REPORT IMMEDIATELY:  Following lower endoscopy (colonoscopy or flexible sigmoidoscopy):  Excessive amounts of blood in the stool  Significant tenderness or worsening of abdominal pains  Swelling of the abdomen that is new, acute  Fever of 100F or higher  For urgent or emergent issues, a gastroenterologist can be reached at any hour by calling [phone]. Do not use MyChart messaging for urgent concerns.    DIET:  We do  recommend a small meal at first, but then you may proceed to your regular diet.  Drink plenty of fluids but you should avoid alcoholic beverages for 24 hours.  ACTIVITY:  You should plan to take it easy for the rest of today and you should NOT DRIVE or use heavy machinery until tomorrow (because of the sedation medicines used during the test).    FOLLOW UP: Our staff will call the number listed on your records the next business day following your procedure.  We will call around 7:15- 8:00 am to check on you and address any questions or concerns that you may have regarding the information given to you following your procedure. If we do not reach you, we will leave a message.     If any biopsies were taken you will be contacted by phone or by letter within the next 1-3 weeks.  Please call us at [phone] if you have not heard about the biopsies in 3 weeks.    SIGNATURES/CONFIDENTIALITY: You and/or your care partner have signed paperwork which will be entered into your electronic medical record.  These signatures attest to the fact that that the information above on your After Visit Summary has been reviewed and is understood.  Full responsibility of the confidentiality of this discharge information lies with you and/or your care-partner.

## 2023-08-15 NOTE — Progress Notes (Signed)
 Called to room to assist during endoscopic procedure.  Patient ID and intended procedure confirmed with present staff. Received instructions for my participation in the procedure from the performing physician.

## 2023-08-15 NOTE — Op Note (Signed)
 [facility] Patient Name: [name] Procedure Date: 08/15/2023 7:36 AM MRN: [id number] Endoscopist: Napoleon Form , MD, [id number] Age: 73 Referring MD:  Date of Birth: 1950-11-09 Gender: Male Account #: [id number] Procedure:                Colonoscopy Indications:              High risk colon cancer surveillance: Personal                            history of adenoma (10 mm or greater in size), High                            risk colon cancer surveillance: Personal history of                            multiple (3 or more) adenomas Medicines:                Monitored Anesthesia Care Procedure:                Pre-Anesthesia Assessment:                           - Prior to the procedure, a History and Physical                            was performed, and patient medications and                            allergies were reviewed. The patient's tolerance of                            previous anesthesia was also reviewed. The risks                            and benefits of the procedure and the sedation                            options and risks were discussed with the patient.                            All questions were answered, and informed consent                            was obtained. Prior Anticoagulants: The patient                            last took Eliquis (apixaban) 2 days prior to the                            procedure. ASA Grade Assessment: III - A patient                            with severe systemic disease. After reviewing the  risks and benefits, the patient was deemed in                            satisfactory condition to undergo the procedure.                           After obtaining informed consent, the colonoscope                            was passed under direct vision. Throughout the                            procedure, the patient's blood pressure, pulse, and                            oxygen  saturations were monitored continuously. The                            Olympus Scope SN [id number] was introduced through the                            anus and advanced to the the cecum, identified by                            appendiceal orifice and ileocecal valve. The                            colonoscopy was performed without difficulty. The                            patient tolerated the procedure well. The quality                            of the bowel preparation was adequate. The                            ileocecal valve, appendiceal orifice, and rectum                            were photographed. Scope In: 8:18:58 AM Scope Out: 8:46:04 AM Scope Withdrawal Time: 0 hours 22 minutes 1 second  Total Procedure Duration: 0 hours 27 minutes 6 seconds  Findings:                 The perianal and digital rectal examinations were                            normal.                           Two sessile polyps were found in the ascending                            colon. The polyps were 2 to 3 mm in size. These  polyps were removed with a cold biopsy forceps.                            Resection and retrieval were complete.                           Five sessile polyps were found in the transverse                            colon. The polyps were 4 to 7 mm in size. These                            polyps were removed with a cold snare. Resection                            and retrieval were complete.                           A 14 mm polyp was found in the descending colon.                            The polyp was pedunculated. The polyp was removed                            with a hot snare. Resection and retrieval were                            complete.                           A few small-mouthed diverticula were found in the                            sigmoid colon.                           Non-bleeding external and internal hemorrhoids were                             found during retroflexion. The hemorrhoids were                            medium-sized. Complications:            No immediate complications. Estimated Blood Loss:     Estimated blood loss was minimal. Impression:               - Two 2 to 3 mm polyps in the ascending colon,                            removed with a cold biopsy forceps. Resected and                            retrieved.                           -  Five 4 to 7 mm polyps in the transverse colon,                            removed with a cold snare. Resected and retrieved.                           - One 14 mm polyp in the descending colon, removed                            with a hot snare. Resected and retrieved.                           - Diverticulosis in the sigmoid colon.                           - Non-bleeding external and internal hemorrhoids. Recommendation:           - Patient has a contact number available for                            emergencies. The signs and symptoms of potential                            delayed complications were discussed with the                            patient. Return to normal activities tomorrow.                            Written discharge instructions were provided to the                            patient.                           - Resume previous diet.                           - Continue present medications.                           - Await pathology results.                           - Repeat colonoscopy in 3 years for surveillance                            based on pathology results.                           - Return to GI clinic at the next available                            appointment for management of steatohepatitis.                           -  Resume Eliquis (apixaban) at prior dose tomorrow.                            Refer to managing physician for further adjustment                            of therapy. Napoleon Form,  MD 08/15/2023 8:53:39 AM This report has been signed electronically.

## 2023-08-16 ENCOUNTER — Telehealth: Payer: Self-pay

## 2023-08-16 NOTE — Telephone Encounter (Signed)
  Follow up Call-     08/15/2023    7:21 AM 07/04/2022    7:34 AM  Call back number  Post procedure Call Back phone  # [phone]  Permission to leave phone message Yes Yes     Patient questions:  Do you have a fever, pain , or abdominal swelling? No. Pain Score  0 *  Have you tolerated food without any problems? Yes.    Have you been able to return to your normal activities? Yes.    Do you have any questions about your discharge instructions: Diet   No. Medications  No. Follow up visit  No.  Do you have questions or concerns about your Care? No.  Actions: * If pain score is 4 or above: No action needed, pain <4.

## 2023-08-19 LAB — SURGICAL PATHOLOGY

## 2023-09-10 ENCOUNTER — Ambulatory Visit: Payer: PPO | Attending: Cardiology | Admitting: Cardiology

## 2023-09-10 ENCOUNTER — Encounter: Payer: Self-pay | Admitting: Cardiology

## 2023-09-10 VITALS — BP 134/80 | HR 60 | Ht 70.0 in | Wt 254.0 lb

## 2023-09-10 DIAGNOSIS — I5022 Chronic systolic (congestive) heart failure: Secondary | ICD-10-CM | POA: Diagnosis not present

## 2023-09-10 DIAGNOSIS — I10 Essential (primary) hypertension: Secondary | ICD-10-CM | POA: Diagnosis not present

## 2023-09-10 DIAGNOSIS — I4891 Unspecified atrial fibrillation: Secondary | ICD-10-CM | POA: Diagnosis not present

## 2023-09-10 DIAGNOSIS — I484 Atypical atrial flutter: Secondary | ICD-10-CM | POA: Diagnosis not present

## 2023-09-10 DIAGNOSIS — D6869 Other thrombophilia: Secondary | ICD-10-CM | POA: Diagnosis not present

## 2023-09-10 NOTE — Progress Notes (Signed)
  Electrophysiology Office Note:   Date:  09/10/2023  ID:  [name], DOB 05/01/1951, MRN [id number]  Primary Cardiologist: Alexandria Angel, MD Primary Heart Failure: None Electrophysiologist: Damontay Alred Cortland Ding, MD      History of Present Illness:   [name] is a 73 y.o. male with h/o coronary artery disease, atrial flutter, mitral regurgitation post repair in 2009 seen today for routine electrophysiology followup.   Since last being seen in our clinic the patient reports doing well.  He has no chest pain or shortness of breath.  He has not had any recurrences of his arrhythmias.  He has no acute complaints at this time.  he denies chest pain, palpitations, dyspnea, PND, orthopnea, nausea, vomiting, dizziness, syncope, edema, weight gain, or early satiety.   Review of systems complete and found to be negative unless listed in HPI.   EP Information / Studies Reviewed:    EKG is ordered today. Personal review as below.  EKG Interpretation Date/Time:  Tuesday September 10 2023 09:34:00 EDT Ventricular Rate:  60 PR Interval:  266 QRS Duration:  96 QT Interval:  434 QTC Calculation: 434 R Axis:   52  Text Interpretation: Sinus rhythm with 1st degree A-V block with Premature atrial complexes Incomplete right bundle branch block When compared with ECG of 05-Jul-2021 14:32, Premature atrial complexes are now Present Confirmed by Chaddrick Brue ([id number]) on 09/10/2023 9:54:10 AM     Risk Assessment/Calculations:    CHA2DS2-VASc Score = 4   This indicates a 4.8% annual risk of stroke. The patient's score is based upon: CHF History: 1 HTN History: 1 Diabetes History: 0 Stroke History: 0 Vascular Disease History: 1 Age Score: 1 Gender Score: 0             Physical Exam:   VS:  BP 134/80 (BP Location: Left Arm, Patient Position: Sitting, Cuff Size: Large)   Pulse 60   Ht 5\' 10"  (1.778 m)   Wt 254 lb (115.2 kg)   SpO2 95%   BMI 36.45 kg/m    Wt Readings from Last 3  Encounters:  09/10/23 254 lb (115.2 kg)  08/15/23 253 lb 6.4 oz (114.9 kg)  06/07/23 253 lb 4 oz (114.9 kg)     GEN: Well nourished, well developed in no acute distress NECK: No JVD; No carotid bruits CARDIAC: Regular rate and rhythm, no murmurs, rubs, gallops RESPIRATORY:  Clear to auscultation without rales, wheezing or rhonchi  ABDOMEN: Soft, non-tender, non-distended EXTREMITIES:  No edema; No deformity   ASSESSMENT AND PLAN:    1.  Atypical atrial flutter: Post ablation 01/21/2019 with tachycardia terminating along the mitral valve with second ablation 05/30/2021 with a roof dependent atrial flutter.  He has not had any further episodes of atrial fibrillation or flutter.  He is happy with his control.  [name] continue with current management.  2.  Secondary hypercoagulable state: On Eliquis  for atrial flutter  3.  Hypertension: Well-controlled  4.  Chronic systolic heart failure: Likely tachycardia mediated.  Plan per primary cardiology.  5.  Coronary artery disease: Mild without chest pain  Follow up with Dr. Lawana Pray  as needed   Signed, Mitali Shenefield Cortland Ding, MD

## 2023-10-02 ENCOUNTER — Ambulatory Visit: Payer: Self-pay | Admitting: Gastroenterology

## 2023-10-03 ENCOUNTER — Telehealth: Payer: Self-pay | Admitting: *Deleted

## 2023-10-03 DIAGNOSIS — R945 Abnormal results of liver function studies: Secondary | ICD-10-CM

## 2023-10-03 DIAGNOSIS — K760 Fatty (change of) liver, not elsewhere classified: Secondary | ICD-10-CM

## 2023-10-03 NOTE — Telephone Encounter (Signed)
 Office visit to see Dr Leonia Raman on 11/12/2023 at 9:50 am Also needs LFT's a week before. Lab orders in for 11/06/2023 .   Called patient to inform all details and he is in agreement with date and times.

## 2023-10-03 NOTE — Telephone Encounter (Signed)
 Called patient and discussed with him that descending colon polyp had focal features of borderline high-grade dysplasia.  Recommend flexible sigmoidoscopy in September for follow-up. He has nonalcoholic steatohepatitis, has been trying with diet and exercise with no significant improvement in LFT.  He is open to starting Rezmetirom for NASH pending insurance approval., please schedule office visit with me in 4 to 6 weeks to discuss treatment for NASH.  Check LFT 1 week prior to the office visit

## 2023-10-27 ENCOUNTER — Other Ambulatory Visit: Payer: Self-pay | Admitting: Cardiology

## 2023-11-06 ENCOUNTER — Other Ambulatory Visit (INDEPENDENT_AMBULATORY_CARE_PROVIDER_SITE_OTHER)

## 2023-11-06 ENCOUNTER — Ambulatory Visit: Payer: Self-pay | Admitting: Gastroenterology

## 2023-11-06 DIAGNOSIS — R945 Abnormal results of liver function studies: Secondary | ICD-10-CM

## 2023-11-06 DIAGNOSIS — K760 Fatty (change of) liver, not elsewhere classified: Secondary | ICD-10-CM | POA: Diagnosis not present

## 2023-11-06 LAB — HEPATIC FUNCTION PANEL
ALT: 75 U/L — ABNORMAL HIGH (ref 0–53)
AST: 40 U/L — ABNORMAL HIGH (ref 0–37)
Albumin: 4.8 g/dL (ref 3.5–5.2)
Alkaline Phosphatase: 51 U/L (ref 39–117)
Bilirubin, Direct: 0.2 mg/dL (ref 0.0–0.3)
Total Bilirubin: 1 mg/dL (ref 0.2–1.2)
Total Protein: 7.1 g/dL (ref 6.0–8.3)

## 2023-11-12 ENCOUNTER — Ambulatory Visit: Admitting: Gastroenterology

## 2023-11-12 ENCOUNTER — Telehealth: Payer: Self-pay | Admitting: *Deleted

## 2023-11-12 ENCOUNTER — Encounter: Payer: Self-pay | Admitting: Gastroenterology

## 2023-11-12 VITALS — BP 114/68 | HR 60 | Ht 70.0 in | Wt 255.0 lb

## 2023-11-12 DIAGNOSIS — Z860101 Personal history of adenomatous and serrated colon polyps: Secondary | ICD-10-CM

## 2023-11-12 DIAGNOSIS — K449 Diaphragmatic hernia without obstruction or gangrene: Secondary | ICD-10-CM

## 2023-11-12 DIAGNOSIS — Z860100 Personal history of colon polyps, unspecified: Secondary | ICD-10-CM

## 2023-11-12 DIAGNOSIS — R748 Abnormal levels of other serum enzymes: Secondary | ICD-10-CM

## 2023-11-12 DIAGNOSIS — R7401 Elevation of levels of liver transaminase levels: Secondary | ICD-10-CM

## 2023-11-12 NOTE — Telephone Encounter (Signed)
 Patient with diagnosis of A flutter on Eliquis  for anticoagulation.    Procedure: Endoscopy Procedure  Date of procedure: 01/02/24   CHA2DS2-VASc Score = 4  This indicates a 4.8% annual risk of stroke. The patient's score is based upon: CHF History: 1 HTN History: 1 Diabetes History: 0 Stroke History: 0 Vascular Disease History: 1 Age Score: 1 Gender Score: 0    CrCl 88 ml/min Platelet count 187K   Per office protocol, patient can hold Eliquis  for 2 days prior to procedure.    **This guidance is not considered finalized until pre-operative APP has relayed final recommendations.**

## 2023-11-12 NOTE — Patient Instructions (Addendum)
 VISIT SUMMARY:  Today, we discussed your current health status and provided dietary counseling. You are feeling well and staying active, which is great. We reviewed your cholesterol management, liver function, hiatal hernia, and follow-up for a previously removed colonic polyp.  YOUR PLAN:  -ELEVATED LIVER ENZYMES: You have mildly elevated liver enzymes, likely due to your cholesterol medication, Repatha . We will continue with your current medication and monitor your liver function tests in 6 months. It's important to reduce sugar intake, increase fiber, and stay active. We will reassess your need for vitamin E supplementation after 6 months based on your liver function tests.  -HIATAL HERNIA: A hiatal hernia occurs when part of the stomach pushes up through the diaphragm. You experience occasional pain from this, especially after late or heavy meals. To manage this, avoid eating late or heavy meals, especially before bedtime, and use your prescribed medication as needed. Try to avoid snacking on processed foods and reduce your sugar intake.  -COLON POLYP: A colon polyp is a growth on the inner lining of the colon, which can sometimes turn into cancer. You had a 14 mm polyp removed previously, and we need to ensure it hasn't grown back. You are scheduled for a flexible sigmoidoscopy on August 14th at 9 AM to check for any regrowth. Please prepare for the procedure with a half colon prep and enema.  INSTRUCTIONS:  Please schedule a liver function test in December 2025. Plan for an annual follow-up in April 2026 to reassess your liver condition and lifestyle modifications. Ensure you complete your flexible sigmoidoscopy on August 14th, 2025.  You have been scheduled for a flexible sigmoidoscopy. Please follow the written instructions given to you at your visit today.  If you use inhalers (even only as needed), please bring them with you on the day of your procedure.  DO NOT TAKE 7 DAYS PRIOR TO  TEST- Trulicity (dulaglutide) Ozempic, Wegovy (semaglutide) Mounjaro (tirzepatide) Bydureon Bcise (exanatide extended release)  DO NOT TAKE 1 DAY PRIOR TO YOUR TEST Rybelsus (semaglutide) Adlyxin (lixisenatide) Victoza (liraglutide) Byetta (exanatide) ___________________________________________________________________________   Due to recent changes in healthcare laws, you may see the results of your imaging and laboratory studies on MyChart before your provider has had a chance to review them.  We understand that in some cases there may be results that are confusing or concerning to you. Not all laboratory results come back in the same time frame and the provider may be waiting for multiple results in order to interpret others.  Please give us  48 hours in order for your provider to thoroughly review all the results before contacting the office for clarification of your results.    I appreciate the  opportunity to care for you  Thank You   Kavitha Nandigam , MD

## 2023-11-12 NOTE — Progress Notes (Signed)
 [name]    [id number]    [birth year]  Primary Care Physician:Lowne Cyndee Jamee SAUNDERS, DO  Referring Physician: Antonio Cyndee, Jamee SAUNDERS, DO [address] [place],  [place] [address]   Chief complaint: Abnormal LFT, adenomatous colon polyp  Discussed the use of AI scribe software for clinical note transcription with the patient, who gave verbal consent to proceed.  History of Present Illness [name] is a 73 year old male who presents for follow-up and dietary counseling. He is accompanied by his wife.  He feels fine and remains active, which he believes is reflected in his stable blood pressure readings. He is currently on Repatha  for cholesterol management, which he suspects may be causing mild elevation in his liver function tests. He previously tried statins but experienced joint pain and did not tolerate them well. His liver tests have been stable, and a previous elastography showed no significant scar tissue. He occasionally experiences itchiness but does not have elevated bilirubin levels. His bilirubin levels are normal.  He has a history of a large colonic polyp, approximately 14 mm, which was removed. He is scheduled for a flexible sigmoidoscopy to ensure no regrowth in the area where the polyp was removed.  He occasionally experiences pain related to a hiatal hernia, which he manages with medication as needed, approximately once every three weeks. The pain is influenced by his eating habits, particularly late or heavy meals.  He acknowledges a habit of snacking, particularly on potato chips. He takes vitamin D  and vitamin E supplements, as well as a multivitamin. His vitamin D  levels were checked five months ago and were within normal range.       Latest Ref Rng & Units 11/06/2023    7:38 AM 08/14/2023   11:50 AM 05/14/2023    8:28 AM  Hepatic Function  Total Protein 6.0 - 8.3 g/dL 7.1  7.2  6.5   Albumin 3.5 - 5.2 g/dL 4.8  4.7  4.5    AST 0 - 37 U/L 40  40  35   ALT 0 - 53 U/L 75  68  56   Alk Phosphatase 39 - 117 U/L 51  55  60   Total Bilirubin 0.2 - 1.2 mg/dL 1.0  1.3  0.7   Bilirubin, Direct 0.0 - 0.3 mg/dL 0.2  0.2     US  Abdomen RUQ W/elastography 07-06-22 -No gallstones or wall thickening visualized. No sonographic Murphy sign noted. -Common bile duct: -Diameter: 3.7 mm -No focal lesion identified. Increased parenchymal echogenicity. Portal vein is patent on color Doppler imaging with normal direction of blood flow towards the liver. -Median kPa: 2.2    EGD 07-04-22 - Z-line regular, 38 cm from the incisors.  - Normal esophagus.  - 5 cm hiatal hernia.  - Gastritis. Biopsied.  - Normal examined duodenum. Surgical [P], gastric - ANTRAL MUCOSA WITH FEATURES OF BOTH MILD CHRONIC INACTIVE GASTRITIS AND CHEMICAL/REACTIVE CHANGE. - OXYNTIC MUCOSA WITH NO SIGNIFICANT PATHOLOGY. - NO HELICOBACTER PYLORI ORGANISMS IDENTIFIED ON H&E STAINED SLIDE.   Colonoscopy 05-06-2018 - One 5 mm polyp in the sigmoid colon, removed with a cold snare. Resected and retrieved.  - Two 1 to 2 mm polyps in the transverse colon, removed with a cold biopsy forceps. Resected and retrieved.  - Diverticulosis in the sigmoid colon and in the descending colon.  - Non-bleeding internal hemorrhoids. Surgical [P], transverse, sigmoid, polyp (3) - TUBULAR ADENOMA (  X2 FRAGMENTS). - HYPERPLASTIC POLYP. - NO HIGH GRADE DYSPLASIA OR MALIGNANCY.   Colonoscopy 03-31-12 -Flat polyp 3-5 mm in size in the sigmoid colon -otherwise normal Surgical [P], sigmoid, polyp - TUBULAR ADENOMA. - NEGATIVE FOR HIGH GRADE DYSPLASIA.   Outpatient Encounter Medications as of 11/12/2023  Medication Sig   amLODipine  (NORVASC ) 5 MG tablet Take 1 tablet (5 mg total) by mouth daily.   amoxicillin  (AMOXIL ) 500 MG capsule Take 2,000 mg by mouth See admin instructions. Take 2000 mg by mouth 1 hour dental procedure   apixaban  (ELIQUIS ) 5 MG TABS tablet Take 1  tablet (5 mg total) by mouth 2 (two) times daily.   carvedilol  (COREG ) 12.5 MG tablet Take 1 tablet (12.5 mg total) by mouth 2 (two) times daily with a meal.   Cholecalciferol (VITAMIN D3) 50 MCG (2000 UT) capsule Take 2,000 Units by mouth daily.   Evolocumab  (REPATHA  SURECLICK) 140 MG/ML SOAJ INJECT 140 MG INTO THE SKIN EVERY 14 (FOURTEEN) DAYS.   meloxicam  (MOBIC ) 15 MG tablet Take 1 tablet (15 mg total) by mouth daily.   Multiple Vitamins-Minerals (CENTRUM SILVER PO) Take 1 tablet by mouth daily.   pantoprazole  (PROTONIX ) 40 MG tablet Take 1 tablet (40 mg total) by mouth daily.   Saw Palmetto 500 MG CAPS Take 500 mg by mouth every other day. 585 mg   telmisartan  (MICARDIS ) 80 MG tablet TAKE 1 TABLET BY MOUTH EVERY DAY   Vitamin E 180 MG (400 UNIT) CAPS Take 400 Units by mouth daily.   No facility-administered encounter medications on file as of 11/12/2023.    Allergies as of 11/12/2023 - Review Complete 11/12/2023  Allergen Reaction Noted   Praluent  [alirocumab ] Shortness Of Breath 05/30/2020   Statins Other (See Comments) 02/08/2014   Codeine Nausea Only    Tape Rash 03/17/2012    Past Medical History:  Diagnosis Date   Allergy    Atrial flutter (HCC)    s/p ablation   CAD (coronary artery disease)    cath 06/2007 40% LM   CVD (cerebrovascular disease)    59% right ICA, 49% left ICA; h/oTIA; fu study 8/11 with normal carotids   Dysrhythmia    Gait abnormality    Heart murmur    History of bronchitis    History of colonoscopy    Horseshoe kidney    Hyperlipidemia    Hypertension    corrected with medication, diet, exercise   MR (mitral regurgitation)    severe; mitral valve repair in march 2009   MVP (mitral valve prolapse)    Osteoarthritis    both knees   Shortness of breath dyspnea    increased exertion     Past Surgical History:  Procedure Laterality Date   A-FLUTTER ABLATION N/A 01/29/2018   Procedure: A-FLUTTER ABLATION;  Surgeon: Inocencio Soyla Lunger, MD;   Location: [facility];  Service: Cardiovascular;  Laterality: N/A;   ATRIAL FIBRILLATION ABLATION N/A 01/21/2019   Procedure: ATRIAL FIBRILLATION ABLATION;  Surgeon: Inocencio Soyla Lunger, MD;  Location: [facility];  Service: Cardiovascular;  Laterality: N/A;   ATRIAL FIBRILLATION ABLATION N/A 05/30/2021   Procedure: ATRIAL FIBRILLATION ABLATION;  Surgeon: Inocencio Soyla Lunger, MD;  Location: [facility];  Service: Cardiovascular;  Laterality: N/A;   BACK SURGERY     secondary to ruptured disc   CARDIAC CATHETERIZATION     CARDIOVERSION N/A 09/23/2017   Procedure: CARDIOVERSION;  Surgeon: Pietro Redell RAMAN, MD;  Location: [facility] ENDOSCOPY;  Service: Cardiovascular;  Laterality: N/A;   heart ablation     JOINT REPLACEMENT     bilateral knees   MITRAL VALVE REPAIR  06/22/2007   SPINE SURGERY     TEE WITHOUT CARDIOVERSION N/A 09/23/2017   Procedure: TRANSESOPHAGEAL ECHOCARDIOGRAM (TEE);  Surgeon: Pietro Redell RAMAN, MD;  Location: [facility] ENDOSCOPY;  Service: Cardiovascular;  Laterality: N/A;   TONSILLECTOMY  05/21/1958   TOTAL KNEE ARTHROPLASTY Bilateral 03/09/2015   Procedure: TOTAL KNEE BILATERAL;  Surgeon: Dempsey Moan, MD;  Location: [facility] ORS;  Service: Orthopedics;  Laterality: Bilateral;    Family History  Problem Relation Age of Onset   Liver disease Father        cancer   Hyperlipidemia Father    Liver cancer Father    Diabetes Other    Colitis Daughter    Crohn's disease Daughter    Colon cancer Neg Hx    Esophageal cancer Neg Hx    Rectal cancer Neg Hx    Stomach cancer Neg Hx     Social History   Socioeconomic History   Marital status: Married    Spouse name: Not on file   Number of children: 2   Years of education: Not on file   Highest education level: Not on file  Occupational History   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   Occupation: retired  Tobacco Use   Smoking status: Never   Smokeless tobacco: Never  Vaping Use    Vaping status: Never Used  Substance and Sexual Activity   Alcohol use: Not Currently    Comment: occasional 1-2 wine drinks a month   Drug use: No   Sexual activity: Not Currently    Partners: Female  Other Topics Concern   Not on file  Social History Narrative   Exercise- gym 3x a week   Social Drivers of Corporate investment banker Strain: Not on file  Food Insecurity: No Food Insecurity (05/21/2023)   Hunger Vital Sign    Worried About Running Out of Food in the Last Year: Never true    Ran Out of Food in the Last Year: Never true  Transportation Needs: No Transportation Needs (05/21/2023)   PRAPARE - Administrator, Civil Service (Medical): No    Lack of Transportation (Non-Medical): No  Physical Activity: Not on file  Stress: Not on file  Social Connections: Unknown (05/21/2023)   Social Connection and Isolation Panel    Frequency of Communication with Friends and Family: Not on file    Frequency of Social Gatherings with Friends and Family: Not on file    Attends Religious Services: More than 4 times per year    Active Member of [employer] or Organizations: Not on file    Attends Banker Meetings: More than 4 times per year    Marital Status: Married  Catering manager Violence: Not At Risk (05/21/2023)   Humiliation, Afraid, Rape, and Kick questionnaire    Fear of Current or Ex-Partner: No    Emotionally Abused: No    Physically Abused: No    Sexually Abused: No      Review of systems: All other review of systems negative except as mentioned in the HPI.   Physical Exam: Vitals:   11/12/23 0957  BP: 114/68  Pulse: 60   Body mass index is 36.59 kg/m. Gen:      No acute distress HEENT:  sclera anicteric CV: s1s2 rrr, no murmur Lungs: B/l clear. Abd:  soft, non-tender; no palpable masses, no distension Ext:    No edema Neuro: alert and oriented x 3 Psych: normal mood and affect  Data Reviewed:  Reviewed labs, radiology  imaging, old records and pertinent past GI work up     Assessment and Plan Assessment & Plan Elevated liver enzymes Mild elevation in liver enzymes, likely related to Repatha  and fatty liver. Statins previously not tolerated due to arthralgia. Benefit-risk assessment favors monitoring. Elastography indicates minimal hepatic damage, no evidence of fibrosis. Discussed potential hepatotoxicity of cholesterol medications. Advised on lifestyle modifications, including dietary changes to reduce sugar intake and promote weight loss. - Continue current cholesterol medication regimen. - Monitor liver function tests in 6 months. - Encourage lifestyle modifications: reduce sugar intake, increase fiber, and maintain physical activity. - Reassess need for vitamin E supplementation after 6 months based on liver function tests.  Hiatal hernia Intermittent pain related to hiatal hernia, exacerbated by late or heavy meals. Symptoms managed with medication as needed. Advised on dietary and lifestyle modifications to prevent symptom exacerbation. - Avoid late and heavy meals, especially before bedtime. - Use prescribed medication as needed for symptom relief. - Encourage dietary changes: avoid snacking, especially on processed foods, and reduce sugar intake.  Colon polyp Previous removal of a 14 mm polyp, with potential for malignant transformation, high-grade dysplasia seen. Plan for flexible sigmoidoscopy to ensure no regrowth or residual tissue. Discussed importance of follow-up to prevent progression to cancer.  - Schedule flexible sigmoidoscopy for August 14th at 9 AM.   Follow-up Follow-up plans for ongoing monitoring and procedures. - Schedule liver function test in December 2025. - Plan for annual follow-up in April 2026 to reassess liver condition and lifestyle modifications. - Ensure flexible sigmoidoscopy is completed on August 14th, 2025.    The patient was provided an opportunity to ask  questions and all were answered. The patient agreed with the plan and demonstrated an understanding of the instructions.  LOIS Wilkie Mcgee , MD    CC: Antonio Meth, Jamee SAUNDERS, *

## 2023-11-12 NOTE — Telephone Encounter (Signed)
 [facility] Pre-operative Risk Assessment     Request for surgical clearance:     Endoscopy Procedure  What type of surgery is being performed?     Flex Sigmoidoscopy  When is this surgery scheduled?     01/02/2024  What type of clearance is required ?   Pharmacy  Are there any medications that need to be held prior to surgery and how long? Eliquis  2 days  Practice name and name of physician performing surgery?      [facility]  What is your office phone and fax number?      Phone- [phone]  Fax- [phone]  Anesthesia type (None, local, MAC, general) ?       MAC   Please route your response to Grayce Loge CMA

## 2023-11-13 NOTE — Telephone Encounter (Signed)
 See recommendation by our clinical pharmacist regarding Eliquis  holding time.

## 2023-11-13 NOTE — Telephone Encounter (Signed)
 Called patient to inform him to hold Eliquis  2 days before his procedure. Patient verbally understands

## 2023-11-16 ENCOUNTER — Emergency Department (HOSPITAL_BASED_OUTPATIENT_CLINIC_OR_DEPARTMENT_OTHER)
Admission: EM | Admit: 2023-11-16 | Discharge: 2023-11-16 | Disposition: A | Discharge status: Home / Self Care | Attending: Emergency Medicine | Admitting: Emergency Medicine

## 2023-11-16 ENCOUNTER — Encounter (HOSPITAL_BASED_OUTPATIENT_CLINIC_OR_DEPARTMENT_OTHER): Payer: Self-pay | Admitting: Emergency Medicine

## 2023-11-16 ENCOUNTER — Other Ambulatory Visit: Payer: Self-pay

## 2023-11-16 DIAGNOSIS — Z7901 Long term (current) use of anticoagulants: Secondary | ICD-10-CM | POA: Diagnosis not present

## 2023-11-16 DIAGNOSIS — M7521 Bicipital tendinitis, right shoulder: Secondary | ICD-10-CM | POA: Insufficient documentation

## 2023-11-16 DIAGNOSIS — M779 Enthesopathy, unspecified: Secondary | ICD-10-CM | POA: Diagnosis not present

## 2023-11-16 DIAGNOSIS — I10 Essential (primary) hypertension: Secondary | ICD-10-CM | POA: Diagnosis not present

## 2023-11-16 DIAGNOSIS — M79601 Pain in right arm: Secondary | ICD-10-CM | POA: Diagnosis present

## 2023-11-16 DIAGNOSIS — M778 Other enthesopathies, not elsewhere classified: Secondary | ICD-10-CM | POA: Diagnosis not present

## 2023-11-16 MED ORDER — KETOROLAC TROMETHAMINE 15 MG/ML IJ SOLN
15.0000 mg | Freq: Once | INTRAMUSCULAR | Status: AC
  Administered 2023-11-16: 15 mg via INTRAMUSCULAR
  Filled 2023-11-16: qty 1

## 2023-11-16 MED ORDER — IBUPROFEN 600 MG PO TABS
600.0000 mg | ORAL_TABLET | Freq: Four times a day (QID) | ORAL | 0 refills | Status: DC | PRN
Start: 2023-11-16 — End: 2024-01-02

## 2023-11-16 NOTE — ED Provider Notes (Signed)
 [place] [facility] Provider Note   CSN: [id number] Arrival date & time: 11/16/23  9079     Patient presents with: Arm Injury   [name] is a 73 y.o. male patient who presents to the emergency department today for further evaluation of right-sided bicep pain that started while he was taking a shower this morning.  Patient states that he was leaning on his right shoulder up against the shower washing himself when he felt a popping/thud sensation in his arm followed by pain.  Patient states it is worse with certain positional movements and when trying to raise his arm over his head.  He denies any weakness or numbness in the arm.  Denies any other direct injury.    Arm Injury      Prior to Admission medications   Medication Sig Start Date End Date Taking? Authorizing Provider  ibuprofen (ADVIL) 600 MG tablet Take 1 tablet (600 mg total) by mouth every 6 (six) hours as needed. 11/16/23  Yes Theotis, Emoni Yang M, PA-C  amLODipine  (NORVASC ) 5 MG tablet Take 1 tablet (5 mg total) by mouth daily. 05/14/23   Pietro Redell RAMAN, MD  amoxicillin  (AMOXIL ) 500 MG capsule Take 2,000 mg by mouth See admin instructions. Take 2000 mg by mouth 1 hour dental procedure 12/31/16   [provider]  apixaban  (ELIQUIS ) 5 MG TABS tablet Take 1 tablet (5 mg total) by mouth 2 (two) times daily. 05/14/23   Pietro Redell RAMAN, MD  carvedilol  (COREG ) 12.5 MG tablet Take 1 tablet (12.5 mg total) by mouth 2 (two) times daily with a meal. 05/10/23   Pietro Redell RAMAN, MD  Cholecalciferol (VITAMIN D3) 50 MCG (2000 UT) capsule Take 2,000 Units by mouth daily. 01/20/23   [provider]  Evolocumab  (REPATHA  SURECLICK) 140 MG/ML SOAJ INJECT 140 MG INTO THE SKIN EVERY 14 (FOURTEEN) DAYS. 10/28/23   Pietro Redell RAMAN, MD  meloxicam  (MOBIC ) 15 MG tablet Take 1 tablet (15 mg total) by mouth daily. 05/21/23   Lowne Chase, Yvonne R, DO  Multiple Vitamins-Minerals (CENTRUM  SILVER PO) Take 1 tablet by mouth daily.    [provider]  pantoprazole  (PROTONIX ) 40 MG tablet Take 1 tablet (40 mg total) by mouth daily. 07/04/22   Nandigam, Kavitha V, MD  Saw Palmetto 500 MG CAPS Take 500 mg by mouth every other day. 585 mg 04/20/20   [provider]  telmisartan  (MICARDIS ) 80 MG tablet TAKE 1 TABLET BY MOUTH EVERY DAY 03/04/23   Pietro Redell RAMAN, MD  Vitamin E 180 MG (400 UNIT) CAPS Take 400 Units by mouth daily. 02/18/21   [provider]    Allergies: Praluent  [alirocumab ], Statins, Codeine, and Tape    Review of Systems  All other systems reviewed and are negative.   Updated Vital Signs BP (!) 140/84 (BP Location: Left Arm)   Pulse 63   Temp 98 F (36.7 C) (Oral)   Resp 16   Ht 5' 10 (1.778 m)   Wt 115.7 kg   SpO2 96%   BMI 36.59 kg/m   Physical Exam Vitals and nursing note reviewed.  Constitutional:      Appearance: Normal appearance. He is obese.  HENT:     Head: Normocephalic and atraumatic.   Eyes:     General:        Right eye: No discharge.        Left eye: No discharge.     Conjunctiva/sclera: Conjunctivae normal.  Pulmonary:     Effort: Pulmonary effort is normal.   Musculoskeletal:     Comments: There is tenderness to palpation over the right bicipital groove.  No evidence of tendon rupture.  Patient able to flex at the elbow and is equal in comparison to the left arm.  2+ radial pulse felt in the right arm and he is neurovascular intact distal to the elbow.   Skin:    General: Skin is warm and dry.     Findings: No rash.   Neurological:     General: No focal deficit present.     Mental Status: He is alert.   Psychiatric:        Mood and Affect: Mood normal.        Behavior: Behavior normal.     (all labs ordered are listed, but only abnormal results are displayed) Labs Reviewed - No data to display  EKG: EKG Interpretation Date/Time:  Saturday November 16 2023 09:37:19 EDT Ventricular Rate:   63 PR Interval:  266 QRS Duration:  117 QT Interval:  406 QTC Calculation: 416 R Axis:   37  Text Interpretation: Sinus rhythm Prolonged PR interval Incomplete right bundle branch block Low voltage, precordial leads Confirmed by Mannie Pac [phone]) on 11/16/2023 9:41:55 AM  Radiology: No results found.   Procedures   Medications Ordered in the ED  ketorolac (TORADOL) 15 MG/ML injection 15 mg (has no administration in time range)       Medical Decision Making [name] is a 73 y.o. male patient who presents to the emergency department today for further evaluation of right bicep pain.  This is likely bicep tendinitis.  He is having tenderness over the bicipital groove.  No evidence of bicep tendon rupture.  Will treat conservatively with arm sling, Toradol here, and anti-inflammatories to go home with.  Strict turn precautions were discussed.  He is safe for discharge at this time.   Risk Prescription drug management.     Final diagnoses:  Biceps tendinitis of right upper extremity    ED Discharge Orders          Ordered    ibuprofen (ADVIL) 600 MG tablet  Every 6 hours PRN        11/16/23 1000               Theotis Peers [place], PA-C 11/16/23 1000    Mannie Pac T, DO 11/16/23 1749

## 2023-11-16 NOTE — Discharge Instructions (Signed)
 I have prescribed you 600 mg tablets of ibuprofen which you can take every 6 hours for the next 3 to 5 days.  I would like for you to follow-up with your primary care doctor for further evaluation if necessary.  You may return to the emergency department for any worsening symptoms.  As we discussed, this is likely bicep tendinitis and should resolve with time.  You can use a sling for comfort.

## 2023-11-16 NOTE — ED Triage Notes (Signed)
 Pt states today he heard a pop in the shower around his elbow and is having right arm pain.

## 2023-11-19 ENCOUNTER — Telehealth: Payer: Self-pay

## 2023-11-19 NOTE — Telephone Encounter (Signed)
 Copied from CRM [id number]. Topic: General - Call Back - No Documentation >> Nov 19, 2023  3:20 PM [place] J wrote: Reason for CRM: Patient is returning a missed call but there is no documentation of who called or what it was regarding. Patient would like a call back if it is of any importance.

## 2024-01-02 ENCOUNTER — Ambulatory Visit (AMBULATORY_SURGERY_CENTER): Admitting: Gastroenterology

## 2024-01-02 ENCOUNTER — Encounter: Payer: Self-pay | Admitting: Gastroenterology

## 2024-01-02 VITALS — BP 107/60 | HR 49 | Temp 98.1°F | Resp 11 | Ht 70.0 in | Wt 255.0 lb

## 2024-01-02 DIAGNOSIS — I10 Essential (primary) hypertension: Secondary | ICD-10-CM | POA: Diagnosis not present

## 2024-01-02 DIAGNOSIS — Z1211 Encounter for screening for malignant neoplasm of colon: Secondary | ICD-10-CM

## 2024-01-02 DIAGNOSIS — D126 Benign neoplasm of colon, unspecified: Secondary | ICD-10-CM

## 2024-01-02 DIAGNOSIS — I4892 Unspecified atrial flutter: Secondary | ICD-10-CM | POA: Diagnosis not present

## 2024-01-02 DIAGNOSIS — I2510 Atherosclerotic heart disease of native coronary artery without angina pectoris: Secondary | ICD-10-CM | POA: Diagnosis not present

## 2024-01-02 DIAGNOSIS — E785 Hyperlipidemia, unspecified: Secondary | ICD-10-CM | POA: Diagnosis not present

## 2024-01-02 DIAGNOSIS — Z860101 Personal history of adenomatous and serrated colon polyps: Secondary | ICD-10-CM | POA: Diagnosis not present

## 2024-01-02 MED ORDER — SODIUM CHLORIDE 0.9 % IV SOLN: 500.0000 mL | Freq: Once | INTRAVENOUS | Status: DC

## 2024-01-02 NOTE — Progress Notes (Signed)
 Updated medical record w/ pt, he was seen in the ED 11/16/23 for tendinitis, otherwise nothing new to medical hx since office visit

## 2024-01-02 NOTE — Progress Notes (Signed)
 Sedate, gd SR, tolerated procedure well, VSS, report to RN

## 2024-01-02 NOTE — Patient Instructions (Signed)
 Resume previous diet. Continue present medications. Repeat colonoscopy in 3 years for surveillance. Resume Eliquis  (apixaban ) at prior dose today. Refer to managing physician for further adjustment of therapy   YOU HAD AN ENDOSCOPIC PROCEDURE TODAY AT THE [facility]:   Refer to the procedure report that was given to you for any specific questions about what was found during the examination.  If the procedure report does not answer your questions, please call your gastroenterologist to clarify.  If you requested that your care partner not be given the details of your procedure findings, then the procedure report has been included in a sealed envelope for you to review at your convenience later.  YOU SHOULD EXPECT: Some feelings of bloating in the abdomen. Passage of more gas than usual.  Walking can help get rid of the air that was put into your GI tract during the procedure and reduce the bloating. If you had a lower endoscopy (such as a colonoscopy or flexible sigmoidoscopy) you may notice spotting of blood in your stool or on the toilet paper. If you underwent a bowel prep for your procedure, you may not have a normal bowel movement for a few days.  Please Note:  You might notice some irritation and congestion in your nose or some drainage.  This is from the oxygen used during your procedure.  There is no need for concern and it should clear up in a day or so.  SYMPTOMS TO REPORT IMMEDIATELY:  Following lower endoscopy (colonoscopy or flexible sigmoidoscopy):  Excessive amounts of blood in the stool  Significant tenderness or worsening of abdominal pains  Swelling of the abdomen that is new, acute  Fever of 100F or higher  For urgent or emergent issues, a gastroenterologist can be reached at any hour by calling [phone]. Do not use MyChart messaging for urgent concerns.    DIET:  We do recommend a small meal at first, but then you may proceed to your regular diet.  Drink  plenty of fluids but you should avoid alcoholic beverages for 24 hours.  ACTIVITY:  You should plan to take it easy for the rest of today and you should NOT DRIVE or use heavy machinery until tomorrow (because of the sedation medicines used during the test).    FOLLOW UP: Our staff will call the number listed on your records the next business day following your procedure.  We will call around 7:15- 8:00 am to check on you and address any questions or concerns that you may have regarding the information given to you following your procedure. If we do not reach you, we will leave a message.     If any biopsies were taken you will be contacted by phone or by letter within the next 1-3 weeks.  Please call us  at [phone] if you have not heard about the biopsies in 3 weeks.    SIGNATURES/CONFIDENTIALITY: You and/or your care partner have signed paperwork which will be entered into your electronic medical record.  These signatures attest to the fact that that the information above on your After Visit Summary has been reviewed and is understood.  Full responsibility of the confidentiality of this discharge information lies with you and/or your care-partner.

## 2024-01-02 NOTE — Progress Notes (Signed)
 Called to room to assist during endoscopic procedure.  Patient ID and intended procedure confirmed with present staff. Received instructions for my participation in the procedure from the performing physician.

## 2024-01-02 NOTE — Progress Notes (Signed)
 [facility] History and Physical   Primary Care Physician:  Antonio Meth, Jamee SAUNDERS, DO   Reason for Procedure:  History of adenomatous colon polyps  Plan:    Surveillance colonoscopy with possible interventions as needed     HPI: [name] is a very pleasant 73 y.o. male here for surveillance colonoscopy. Denies any nausea, vomiting, abdominal pain, melena or bright red blood per rectum  The risks and benefits as well as alternatives of endoscopic procedure(s) have been discussed and reviewed. All questions answered. The patient agrees to proceed.    Past Medical History:  Diagnosis Date   Allergy    Atrial flutter (HCC)    s/p ablation   CAD (coronary artery disease)    cath 06/2007 40% LM   CVD (cerebrovascular disease)    59% right ICA, 49% left ICA; h/oTIA; fu study 8/11 with normal carotids   Dysrhythmia    Gait abnormality    Heart murmur    History of bronchitis    History of colonoscopy    Horseshoe kidney    Hyperlipidemia    Hypertension    corrected with medication, diet, exercise   MR (mitral regurgitation)    severe; mitral valve repair in march 2009   MVP (mitral valve prolapse)    Osteoarthritis    both knees   Shortness of breath dyspnea    increased exertion     Past Surgical History:  Procedure Laterality Date   A-FLUTTER ABLATION N/A 01/29/2018   Procedure: A-FLUTTER ABLATION;  Surgeon: Inocencio Soyla Lunger, MD;  Location: [facility];  Service: Cardiovascular;  Laterality: N/A;   ATRIAL FIBRILLATION ABLATION N/A 01/21/2019   Procedure: ATRIAL FIBRILLATION ABLATION;  Surgeon: Inocencio Soyla Lunger, MD;  Location: [facility];  Service: Cardiovascular;  Laterality: N/A;   ATRIAL FIBRILLATION ABLATION N/A 05/30/2021   Procedure: ATRIAL FIBRILLATION ABLATION;  Surgeon: Inocencio Soyla Lunger, MD;  Location: [facility];  Service: Cardiovascular;  Laterality: N/A;   BACK SURGERY     secondary to ruptured disc    CARDIAC CATHETERIZATION     CARDIOVERSION N/A 09/23/2017   Procedure: CARDIOVERSION;  Surgeon: Pietro Redell RAMAN, MD;  Location: [facility] ENDOSCOPY;  Service: Cardiovascular;  Laterality: N/A;   COLONOSCOPY     heart ablation     JOINT REPLACEMENT     bilateral knees   MITRAL VALVE REPAIR  06/22/2007   SPINE SURGERY     TEE WITHOUT CARDIOVERSION N/A 09/23/2017   Procedure: TRANSESOPHAGEAL ECHOCARDIOGRAM (TEE);  Surgeon: Pietro Redell RAMAN, MD;  Location: [facility]COPY;  Service: Cardiovascular;  Laterality: N/A;   TONSILLECTOMY  05/21/1958   TOTAL KNEE ARTHROPLASTY Bilateral 03/09/2015   Procedure: TOTAL KNEE BILATERAL;  Surgeon: Dempsey Moan, MD;  Location: [facility] ORS;  Service: Orthopedics;  Laterality: Bilateral;    Prior to Admission medications   Medication Sig Start Date End Date Taking? Authorizing Provider  amLODipine  (NORVASC ) 5 MG tablet Take 1 tablet (5 mg total) by mouth daily. 05/14/23   Pietro Redell RAMAN, MD  amoxicillin  (AMOXIL ) 500 MG capsule Take 2,000 mg by mouth See admin instructions. Take 2000 mg by mouth 1 hour dental procedure 12/31/16   [provider]  apixaban  (ELIQUIS ) 5 MG TABS tablet Take 1 tablet (5 mg total) by mouth 2 (two) times daily. 05/14/23   Pietro Redell RAMAN, MD  carvedilol  (COREG ) 12.5 MG tablet Take 1 tablet (12.5 mg total) by mouth 2 (two) times daily with a meal. 05/10/23  Pietro Redell RAMAN, MD  Cholecalciferol (VITAMIN D3) 50 MCG (2000 UT) capsule Take 2,000 Units by mouth daily. 01/20/23   [provider]  Evolocumab  (REPATHA  SURECLICK) 140 MG/ML SOAJ INJECT 140 MG INTO THE SKIN EVERY 14 (FOURTEEN) DAYS. 10/28/23   Pietro Redell RAMAN, MD  meloxicam  (MOBIC ) 15 MG tablet Take 1 tablet (15 mg total) by mouth daily. 05/21/23   Lowne Chase, Yvonne R, DO  Multiple Vitamins-Minerals (CENTRUM SILVER PO) Take 1 tablet by mouth daily.    [provider]  pantoprazole  (PROTONIX ) 40 MG tablet Take 1 tablet (40 mg total) by mouth daily. 07/04/22    Vahe Pienta V, MD  Saw Palmetto 500 MG CAPS Take 500 mg by mouth every other day. 585 mg 04/20/20   [provider]  telmisartan  (MICARDIS ) 80 MG tablet TAKE 1 TABLET BY MOUTH EVERY DAY 03/04/23   Pietro Redell RAMAN, MD  Vitamin E 180 MG (400 UNIT) CAPS Take 400 Units by mouth daily. 02/18/21   [provider]    Current Outpatient Medications  Medication Sig Dispense Refill   amLODipine  (NORVASC ) 5 MG tablet Take 1 tablet (5 mg total) by mouth daily. 90 tablet 3   amoxicillin  (AMOXIL ) 500 MG capsule Take 2,000 mg by mouth See admin instructions. Take 2000 mg by mouth 1 hour dental procedure     apixaban  (ELIQUIS ) 5 MG TABS tablet Take 1 tablet (5 mg total) by mouth 2 (two) times daily. 180 tablet 3   carvedilol  (COREG ) 12.5 MG tablet Take 1 tablet (12.5 mg total) by mouth 2 (two) times daily with a meal. 180 tablet 3   Cholecalciferol (VITAMIN D3) 50 MCG (2000 UT) capsule Take 2,000 Units by mouth daily.     Evolocumab  (REPATHA  SURECLICK) 140 MG/ML SOAJ INJECT 140 MG INTO THE SKIN EVERY 14 (FOURTEEN) DAYS. 6 mL 3   meloxicam  (MOBIC ) 15 MG tablet Take 1 tablet (15 mg total) by mouth daily. 30 tablet 2   Multiple Vitamins-Minerals (CENTRUM SILVER PO) Take 1 tablet by mouth daily.     pantoprazole  (PROTONIX ) 40 MG tablet Take 1 tablet (40 mg total) by mouth daily. 90 tablet 3   Saw Palmetto 500 MG CAPS Take 500 mg by mouth every other day. 585 mg     telmisartan  (MICARDIS ) 80 MG tablet TAKE 1 TABLET BY MOUTH EVERY DAY 90 tablet 3   Vitamin E 180 MG (400 UNIT) CAPS Take 400 Units by mouth daily.     Current Facility-Administered Medications  Medication Dose Route Frequency Provider Last Rate Last Admin   0.9 %  sodium chloride  infusion  500 mL Intravenous Once Milda Lindvall V, MD        Allergies as of 01/02/2024 - Review Complete 01/02/2024  Allergen Reaction Noted   Praluent  [alirocumab ] Shortness Of Breath 05/30/2020   Statins Other (See Comments) 02/08/2014    Codeine Nausea Only    Tape Rash 03/17/2012    Family History  Problem Relation Age of Onset   Liver disease Father        cancer   Hyperlipidemia Father    Liver cancer Father    Colitis Daughter    Crohn's disease Daughter    Diabetes Other    Colon cancer Neg Hx    Esophageal cancer Neg Hx    Rectal cancer Neg Hx    Stomach cancer Neg Hx    Colon polyps Neg Hx     Social History   Socioeconomic History   Marital  status: Married    Spouse name: Not on file   Number of children: 2   Years of education: Not on file   Highest education level: Not on file  Occupational History   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   Occupation: retired  Tobacco Use   Smoking status: Never   Smokeless tobacco: Never  Vaping Use   Vaping status: Never Used  Substance and Sexual Activity   Alcohol use: Not Currently    Comment: occasional 1-2 wine drinks a month   Drug use: Never   Sexual activity: Not Currently    Partners: Female  Other Topics Concern   Not on file  Social History Narrative   Exercise- gym 3x a week   Social Drivers of Corporate investment banker Strain: Not on file  Food Insecurity: No Food Insecurity (05/21/2023)   Hunger Vital Sign    Worried About Running Out of Food in the Last Year: Never true    Ran Out of Food in the Last Year: Never true  Transportation Needs: No Transportation Needs (05/21/2023)   PRAPARE - Administrator, Civil Service (Medical): No    Lack of Transportation (Non-Medical): No  Physical Activity: Not on file  Stress: Not on file  Social Connections: Unknown (05/21/2023)   Social Connection and Isolation Panel    Frequency of Communication with Friends and Family: Not on file    Frequency of Social Gatherings with Friends and Family: Not on file    Attends Religious Services: More than 4 times per year    Active Member of [employer] or Organizations: Not on file    Attends Banker Meetings:  More than 4 times per year    Marital Status: Married  Catering manager Violence: Not At Risk (05/21/2023)   Humiliation, Afraid, Rape, and Kick questionnaire    Fear of Current or Ex-Partner: No    Emotionally Abused: No    Physically Abused: No    Sexually Abused: No    Review of Systems:  All other review of systems negative except as mentioned in the HPI.  Physical Exam: Vital signs in last 24 hours: BP 120/68   Pulse (!) 54   Temp 98.1 F (36.7 C) (Temporal)   Ht 5' 10 (1.778 m)   Wt 255 lb (115.7 kg)   SpO2 94%   BMI 36.59 kg/m  General:   Alert, NAD Lungs:  Clear .   Heart:  Regular rate and rhythm Abdomen:  Soft, nontender and nondistended. Neuro/Psych:  Alert and cooperative. Normal mood and affect. A and O x 3  Reviewed labs, radiology imaging, old records and pertinent past GI work up  Patient is appropriate for planned procedure(s) and anesthesia in an ambulatory setting   K. Veena Venice Marcucci , MD [phone]

## 2024-01-02 NOTE — Op Note (Addendum)
 [facility] Patient Name: [name] Procedure Date: 01/02/2024 9:48 AM MRN: [id number] Endoscopist: Gustav ALONSO Mcgee , MD, [id number] Age: 73 Referring MD:  Date of Birth: May 12, 1951 Gender: Male Account #: [id number] Procedure:                Flexible Sigmoidoscopy Indications:              High risk colon cancer surveillance: Personal                            history of adenoma with high grade dysplasia Medicines:                Monitored Anesthesia Care Procedure:                Pre-Anesthesia Assessment:                           - Prior to the procedure, a History and Physical                            was performed, and patient medications and                            allergies were reviewed. The patient's tolerance of                            previous anesthesia was also reviewed. The risks                            and benefits of the procedure and the sedation                            options and risks were discussed with the patient.                            All questions were answered, and informed consent                            was obtained. Prior Anticoagulants: The patient                            last took Eliquis  (apixaban ) 2 days prior to the                            procedure. ASA Grade Assessment: III - A patient                            with severe systemic disease. After reviewing the                            risks and benefits, the patient was deemed in                            satisfactory condition to undergo the procedure.  After obtaining informed consent, the scope was                            passed under direct vision. The [id number]-[id number]                            Colonoscope [id number] was introduced through the anus                            and advanced to the the splenic flexure. The                            flexible sigmoidoscopy was accomplished without                             difficulty. The patient tolerated the procedure                            well. The quality of the bowel preparation was good. Scope In: 9:59:46 AM Scope Out: 10:09:13 AM Total Procedure Duration: 0 hours 9 minutes 27 seconds  Findings:                 The perianal and digital rectal examinations were                            normal.                           A 3 mm post polypectomy scar was found in the                            descending colon. There was no evidence of the                            previous polyp. Area was tattooed with an injection                            of 3 mL of Spot (carbon black).                           The exam was otherwise without abnormality. Complications:            No immediate complications. Estimated Blood Loss:     Estimated blood loss: none. Impression:               - Post-polypectomy scar in the descending colon.                            Tattooed.                           - The examination was otherwise normal.                           - No specimens collected. Recommendation:           -  Patient has a contact number available for                            emergencies. The signs and symptoms of potential                            delayed complications were discussed with the                            patient. Return to normal activities tomorrow.                            Written discharge instructions were provided to the                            patient.                           - Resume previous diet.                           - Continue present medications.                           - Repeat colonoscopy in 3 years for surveillance.                           - Resume Eliquis  (apixaban ) at prior dose today.                            Refer to managing physician for further adjustment                            of therapy. Tyden Kann V. Dalya Maselli, MD 01/02/2024 10:13:23 AM This report has been signed electronically.

## 2024-01-03 ENCOUNTER — Telehealth: Payer: Self-pay

## 2024-01-03 NOTE — Telephone Encounter (Signed)
  Follow up Call-     01/02/2024    9:03 AM 08/15/2023    7:21 AM 07/04/2022    7:34 AM  Call back number  Post procedure Call Back phone  # [phone]  Permission to leave phone message Yes Yes Yes     Patient questions:  Do you have a fever, pain , or abdominal swelling? No. Pain Score  0 *  Have you tolerated food without any problems? Yes.    Have you been able to return to your normal activities? Yes.    Do you have any questions about your discharge instructions: Diet   No. Medications  No. Follow up visit  No.  Do you have questions or concerns about your Care? No.  Actions: * If pain score is 4 or above: No action needed, pain <4.

## 2024-02-04 DIAGNOSIS — H2513 Age-related nuclear cataract, bilateral: Secondary | ICD-10-CM | POA: Diagnosis not present

## 2024-02-04 DIAGNOSIS — H35031 Hypertensive retinopathy, right eye: Secondary | ICD-10-CM | POA: Diagnosis not present

## 2024-02-04 DIAGNOSIS — H43811 Vitreous degeneration, right eye: Secondary | ICD-10-CM | POA: Diagnosis not present

## 2024-02-04 DIAGNOSIS — H52223 Regular astigmatism, bilateral: Secondary | ICD-10-CM | POA: Diagnosis not present

## 2024-02-04 DIAGNOSIS — H5203 Hypermetropia, bilateral: Secondary | ICD-10-CM | POA: Diagnosis not present

## 2024-02-04 DIAGNOSIS — H524 Presbyopia: Secondary | ICD-10-CM | POA: Diagnosis not present

## 2024-02-05 ENCOUNTER — Other Ambulatory Visit: Payer: Self-pay | Admitting: Cardiology

## 2024-03-31 ENCOUNTER — Ambulatory Visit: Admitting: Family Medicine

## 2024-03-31 ENCOUNTER — Encounter: Payer: Self-pay | Admitting: Family Medicine

## 2024-03-31 VITALS — BP 132/80 | HR 55 | Temp 97.9°F | Resp 18 | Ht 70.0 in | Wt 247.2 lb

## 2024-03-31 DIAGNOSIS — R351 Nocturia: Secondary | ICD-10-CM

## 2024-03-31 DIAGNOSIS — I10 Essential (primary) hypertension: Secondary | ICD-10-CM | POA: Diagnosis not present

## 2024-03-31 DIAGNOSIS — I4819 Other persistent atrial fibrillation: Secondary | ICD-10-CM

## 2024-03-31 DIAGNOSIS — E7800 Pure hypercholesterolemia, unspecified: Secondary | ICD-10-CM | POA: Diagnosis not present

## 2024-03-31 DIAGNOSIS — R252 Cramp and spasm: Secondary | ICD-10-CM

## 2024-03-31 DIAGNOSIS — E782 Mixed hyperlipidemia: Secondary | ICD-10-CM | POA: Diagnosis not present

## 2024-03-31 DIAGNOSIS — M150 Primary generalized (osteo)arthritis: Secondary | ICD-10-CM | POA: Diagnosis not present

## 2024-03-31 DIAGNOSIS — Z0000 Encounter for general adult medical examination without abnormal findings: Secondary | ICD-10-CM | POA: Diagnosis not present

## 2024-03-31 DIAGNOSIS — I484 Atypical atrial flutter: Secondary | ICD-10-CM | POA: Diagnosis not present

## 2024-03-31 DIAGNOSIS — Z23 Encounter for immunization: Secondary | ICD-10-CM | POA: Diagnosis not present

## 2024-03-31 DIAGNOSIS — I2510 Atherosclerotic heart disease of native coronary artery without angina pectoris: Secondary | ICD-10-CM | POA: Diagnosis not present

## 2024-03-31 LAB — CBC WITH DIFFERENTIAL/PLATELET
Basophils Absolute: 0 K/uL (ref 0.0–0.1)
Basophils Relative: 0.7 % (ref 0.0–3.0)
Eosinophils Absolute: 0.2 K/uL (ref 0.0–0.7)
Eosinophils Relative: 3 % (ref 0.0–5.0)
HCT: 44.8 % (ref 39.0–52.0)
Hemoglobin: 15.2 g/dL (ref 13.0–17.0)
Lymphocytes Relative: 26.9 % (ref 12.0–46.0)
Lymphs Abs: 1.4 K/uL (ref 0.7–4.0)
MCHC: 33.9 g/dL (ref 30.0–36.0)
MCV: 92.4 fl (ref 78.0–100.0)
Monocytes Absolute: 0.6 K/uL (ref 0.1–1.0)
Monocytes Relative: 10.8 % (ref 3.0–12.0)
Neutro Abs: 3 K/uL (ref 1.4–7.7)
Neutrophils Relative %: 58.6 % (ref 43.0–77.0)
Platelets: 183 K/uL (ref 150.0–400.0)
RBC: 4.85 Mil/uL (ref 4.22–5.81)
RDW: 12.7 % (ref 11.5–15.5)
WBC: 5.2 K/uL (ref 4.0–10.5)

## 2024-03-31 LAB — COMPREHENSIVE METABOLIC PANEL WITH GFR
ALT: 31 U/L (ref 0–53)
AST: 22 U/L (ref 0–37)
Albumin: 4.8 g/dL (ref 3.5–5.2)
Alkaline Phosphatase: 54 U/L (ref 39–117)
BUN: 14 mg/dL (ref 6–23)
CO2: 29 meq/L (ref 19–32)
Calcium: 9.7 mg/dL (ref 8.4–10.5)
Chloride: 101 meq/L (ref 96–112)
Creatinine, Ser: 1.08 mg/dL (ref 0.40–1.50)
GFR: 69.32 mL/min (ref >60.00)
Glucose, Bld: 100 mg/dL — ABNORMAL HIGH (ref 70–99)
Potassium: 4.6 meq/L (ref 3.5–5.1)
Sodium: 138 meq/L (ref 135–145)
Total Bilirubin: 1.3 mg/dL — ABNORMAL HIGH (ref 0.2–1.2)
Total Protein: 7 g/dL (ref 6.0–8.3)

## 2024-03-31 LAB — LIPID PANEL
Cholesterol: 142 mg/dL (ref 0–200)
HDL: 48.3 mg/dL (ref >39.00)
LDL Cholesterol: 71 mg/dL (ref 0–99)
NonHDL: 93.8
Total CHOL/HDL Ratio: 3
Triglycerides: 115 mg/dL (ref 0.0–149.0)
VLDL: 23 mg/dL (ref 0.0–40.0)

## 2024-03-31 LAB — MAGNESIUM: Magnesium: 2.2 mg/dL (ref 1.5–2.5)

## 2024-03-31 LAB — PSA: PSA: 1.53 ng/mL (ref 0.10–4.00)

## 2024-03-31 LAB — TSH: TSH: 1.63 u[IU]/mL (ref 0.35–5.50)

## 2024-03-31 MED ORDER — MELOXICAM 15 MG PO TABS: 15.0000 mg | ORAL_TABLET | Freq: Every day | ORAL | 0 refills | Status: AC

## 2024-03-31 NOTE — Assessment & Plan Note (Signed)
Check labs Per cardiology 

## 2024-03-31 NOTE — Assessment & Plan Note (Signed)
On eliquis per cardiology

## 2024-03-31 NOTE — Assessment & Plan Note (Signed)
 Ghm utd Check labs See AVS Health Maintenance  Topic Date Due   Medicare Annual Wellness (AWV)  Never done   COVID-19 Vaccine (2 - Pfizer risk series) 04/16/2024 (Originally 04/08/2020)   Colonoscopy  08/15/2026   DTaP/Tdap/Td (4 - Td or Tdap) 03/20/2032   Pneumococcal Vaccine: 50+ Years  Completed   Influenza Vaccine  Completed   Hepatitis C Screening  Completed   Zoster Vaccines- Shingrix   Completed   Meningococcal B Vaccine  Aged Out

## 2024-03-31 NOTE — Assessment & Plan Note (Signed)
 Pt is working out again [employer]

## 2024-03-31 NOTE — Progress Notes (Signed)
 Subjective:    Patient ID: [name], male    DOB: 1950/11/07, 73 y.o.   MRN: [id number]  Chief Complaint  Patient presents with   Annual Exam    HPI Patient is in today for cpe.  Discussed the use of AI scribe software for clinical note transcription with the patient, who gave verbal consent to proceed.  History of Present Illness [name] is a 73 year old male who presents for an annual physical exam.  He engages in physical activity three days a week but notes that muscle gain is not as apparent as it was when he was younger. He uses an app called 'My Net Diary' to monitor his caloric intake, which has helped him lose twelve pounds. He has not significantly changed his diet but has adjusted the amount and schedule of his meals.  He experienced a sharp pain in his shoulder after leaning against the shower, which led to a visit to the emergency room. He was temporarily prescribed 600 mg of ibuprofen , which he no longer takes. He also uses meloxicam  occasionally for arthritis-related joint pain, with a 30-day supply lasting him a year.  He has a history of knee surgery and has been informed that his lower spine is curving, affecting his posture. He occasionally experiences bloodshot eyes upon waking, which resolves over time. He takes pantoprazole  for a hiatal hernia diagnosed as 5 cm in size.  He has two hernias, one of which occasionally causes cramps. He also has a history of atrial flutter, which was managed previously. He is on Eliquis  and Repatha , which have controlled his cholesterol levels. He reports noticing discoloration on his skin and wonders if it is related to old age or his medications.    Past Medical History:  Diagnosis Date   Allergy    Atrial flutter (HCC)    s/p ablation   CAD (coronary artery disease)    cath 06/2007 40% LM   CVD (cerebrovascular disease)    59% right ICA, 49% left ICA; h/oTIA; fu study 8/11 with normal carotids   Dysrhythmia     Gait abnormality    Heart murmur    History of bronchitis    History of colonoscopy    Horseshoe kidney    Hyperlipidemia    Hypertension    corrected with medication, diet, exercise   MR (mitral regurgitation)    severe; mitral valve repair in march 2009   MVP (mitral valve prolapse)    Osteoarthritis    both knees   Shortness of breath dyspnea    increased exertion     Past Surgical History:  Procedure Laterality Date   A-FLUTTER ABLATION N/A 01/29/2018   Procedure: A-FLUTTER ABLATION;  Surgeon: Inocencio Soyla Lunger, MD;  Location: [facility];  Service: Cardiovascular;  Laterality: N/A;   ATRIAL FIBRILLATION ABLATION N/A 01/21/2019   Procedure: ATRIAL FIBRILLATION ABLATION;  Surgeon: Inocencio Soyla Lunger, MD;  Location: [facility];  Service: Cardiovascular;  Laterality: N/A;   ATRIAL FIBRILLATION ABLATION N/A 05/30/2021   Procedure: ATRIAL FIBRILLATION ABLATION;  Surgeon: Inocencio Soyla Lunger, MD;  Location: [facility];  Service: Cardiovascular;  Laterality: N/A;   BACK SURGERY     secondary to ruptured disc   CARDIAC CATHETERIZATION     CARDIOVERSION N/A 09/23/2017   Procedure: CARDIOVERSION;  Surgeon: Pietro Redell RAMAN, MD;  Location: [facility];  Service: Cardiovascular;  Laterality: N/A;   COLONOSCOPY     heart ablation  JOINT REPLACEMENT     bilateral knees   MITRAL VALVE REPAIR  06/22/2007   SPINE SURGERY     TEE WITHOUT CARDIOVERSION N/A 09/23/2017   Procedure: TRANSESOPHAGEAL ECHOCARDIOGRAM (TEE);  Surgeon: Pietro Redell RAMAN, MD;  Location: [facility];  Service: Cardiovascular;  Laterality: N/A;   TONSILLECTOMY  05/21/1958   TOTAL KNEE ARTHROPLASTY Bilateral 03/09/2015   Procedure: TOTAL KNEE BILATERAL;  Surgeon: Dempsey Moan, MD;  Location: [facility];  Service: Orthopedics;  Laterality: Bilateral;    Family History  Problem Relation Age of Onset   Liver disease Father        cancer   Hyperlipidemia Father    Liver cancer Father     Colitis Daughter    Crohn's disease Daughter    Diabetes Other    Colon cancer Neg Hx    Esophageal cancer Neg Hx    Rectal cancer Neg Hx    Stomach cancer Neg Hx    Colon polyps Neg Hx     Social History   Socioeconomic History   Marital status: Married    Spouse name: Not on file   Number of children: 2   Years of education: Not on file   Highest education level: Bachelor's degree (e.g., BA, AB, BS)  Occupational History   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   Occupation: retired  Tobacco Use   Smoking status: Never   Smokeless tobacco: Never  Vaping Use   Vaping status: Never Used  Substance and Sexual Activity   Alcohol use: Not Currently    Comment: occasional 1-2 wine drinks a month   Drug use: Never   Sexual activity: Not Currently    Partners: Female  Other Topics Concern   Not on file  Social History Narrative   Exercise- gym 3x a week   Social Drivers of Corporate Investment Banker Strain: Low Risk  (03/24/2024)   Overall Financial Resource Strain (CARDIA)    Difficulty of Paying Living Expenses: Not very hard  Food Insecurity: No Food Insecurity (03/24/2024)   Hunger Vital Sign    Worried About Running Out of Food in the Last Year: Never true    Ran Out of Food in the Last Year: Never true  Transportation Needs: Unknown (03/24/2024)   PRAPARE - Administrator, Civil Service (Medical): Not on file    Lack of Transportation (Non-Medical): No  Physical Activity: Insufficiently Active (03/24/2024)   Exercise Vital Sign    Days of Exercise per Week: 3 days    Minutes of Exercise per Session: 20 min  Stress: No Stress Concern Present (03/24/2024)   [employer] of Occupational Health - Occupational Stress Questionnaire    Feeling of Stress: Not at all  Social Connections: Moderately Integrated (03/24/2024)   Social Connection and Isolation Panel    Frequency of Communication with Friends and Family: Once a week     Frequency of Social Gatherings with Friends and Family: Once a week    Attends Religious Services: More than 4 times per year    Active Member of [employer] or Organizations: Yes    Attends Banker Meetings: 1 to 4 times per year    Marital Status: Married  Catering Manager Violence: Not At Risk (05/21/2023)   Humiliation, Afraid, Rape, and Kick questionnaire    Fear of Current or Ex-Partner: No    Emotionally Abused: No    Physically Abused: No    Sexually Abused:  No    Outpatient Medications Prior to Visit  Medication Sig Dispense Refill   amLODipine  (NORVASC ) 5 MG tablet Take 1 tablet (5 mg total) by mouth daily. 90 tablet 3   apixaban  (ELIQUIS ) 5 MG TABS tablet Take 1 tablet (5 mg total) by mouth 2 (two) times daily. 180 tablet 3   carvedilol  (COREG ) 12.5 MG tablet Take 1 tablet (12.5 mg total) by mouth 2 (two) times daily with a meal. 180 tablet 3   Cholecalciferol (VITAMIN D3) 50 MCG (2000 UT) capsule Take 2,000 Units by mouth daily.     Evolocumab  (REPATHA  SURECLICK) 140 MG/ML SOAJ INJECT 140 MG INTO THE SKIN EVERY 14 (FOURTEEN) DAYS. 6 mL 3   Multiple Vitamins-Minerals (CENTRUM SILVER PO) Take 1 tablet by mouth daily.     pantoprazole  (PROTONIX ) 40 MG tablet Take 1 tablet (40 mg total) by mouth daily. 90 tablet 3   Saw Palmetto 500 MG CAPS Take 500 mg by mouth every other day. 585 mg     telmisartan  (MICARDIS ) 80 MG tablet TAKE 1 TABLET BY MOUTH EVERY DAY 90 tablet 3   Vitamin E 180 MG (400 UNIT) CAPS Take 400 Units by mouth daily.     amoxicillin  (AMOXIL ) 500 MG capsule Take 2,000 mg by mouth See admin instructions. Take 2000 mg by mouth 1 hour dental procedure     meloxicam  (MOBIC ) 15 MG tablet Take 1 tablet (15 mg total) by mouth daily. 30 tablet 2   acetaminophen  (TYLENOL ) 325 MG tablet Take 650 mg by mouth every 6 (six) hours as needed.     No facility-administered medications prior to visit.    Allergies  Allergen Reactions   Praluent  [Alirocumab ] Shortness Of  Breath   Statins Other (See Comments)    elevated liver function    Codeine Nausea Only   Tape Rash    Review of Systems  Constitutional:  Negative for fever and malaise/fatigue.  HENT:  Negative for congestion.   Eyes:  Negative for blurred vision.  Respiratory:  Negative for cough and shortness of breath.   Cardiovascular:  Negative for chest pain, palpitations and leg swelling.  Gastrointestinal:  Negative for abdominal pain, blood in stool, nausea and vomiting.  Genitourinary:  Negative for dysuria and frequency.  Musculoskeletal:  Negative for back pain and falls.  Skin:  Negative for rash.  Neurological:  Negative for dizziness, loss of consciousness and headaches.  Endo/Heme/Allergies:  Negative for environmental allergies.  Psychiatric/Behavioral:  Negative for depression. The patient is not nervous/anxious.        Objective:    Physical Exam Vitals and nursing note reviewed.  Constitutional:      General: He is not in acute distress.    Appearance: Normal appearance. He is well-developed.  HENT:     Head: Normocephalic and atraumatic.     Right Ear: Tympanic membrane, ear canal and external ear normal. There is no impacted cerumen.     Left Ear: Tympanic membrane, ear canal and external ear normal. There is no impacted cerumen.     Nose: Nose normal.     Mouth/Throat:     Mouth: Mucous membranes are moist.     Pharynx: Oropharynx is clear. No oropharyngeal exudate or posterior oropharyngeal erythema.  Eyes:     General: No scleral icterus.       Right eye: No discharge.        Left eye: No discharge.     Conjunctiva/sclera: Conjunctivae normal.     Pupils:  Pupils are equal, round, and reactive to light.  Neck:     Thyroid : No thyromegaly.     Vascular: No JVD.  Cardiovascular:     Rate and Rhythm: Normal rate and regular rhythm.     Heart sounds: Normal heart sounds. No murmur heard. Pulmonary:     Effort: Pulmonary effort is normal. No respiratory  distress.     Breath sounds: Normal breath sounds.  Abdominal:     General: Bowel sounds are normal. There is no distension.     Palpations: Abdomen is soft. There is no mass.     Tenderness: There is no abdominal tenderness. There is no guarding or rebound.  Musculoskeletal:        General: Normal range of motion.     Cervical back: Normal range of motion and neck supple.     Right lower leg: No edema.     Left lower leg: No edema.  Lymphadenopathy:     Cervical: No cervical adenopathy.  Skin:    General: Skin is warm and dry.     Findings: No erythema or rash.  Neurological:     Mental Status: He is alert and oriented to person, place, and time.     Cranial Nerves: No cranial nerve deficit.     Motor: No abnormal muscle tone.     Deep Tendon Reflexes: Reflexes are normal and symmetric. Reflexes normal.  Psychiatric:        Mood and Affect: Mood normal.        Behavior: Behavior normal.        Thought Content: Thought content normal.        Judgment: Judgment normal.     BP 132/80 (BP Location: Left Arm, Patient Position: Sitting, Cuff Size: Large)   Pulse (!) 55   Temp 97.9 F (36.6 C) (Oral)   Resp 18   Ht 5' 10 (1.778 m)   Wt 247 lb 3.2 oz (112.1 kg)   SpO2 96%   BMI 35.47 kg/m  Wt Readings from Last 3 Encounters:  03/31/24 247 lb 3.2 oz (112.1 kg)  01/02/24 255 lb (115.7 kg)  11/16/23 255 lb (115.7 kg)    Diabetic Foot Exam - Simple   No data filed    Lab Results  Component Value Date   WBC 5.5 05/14/2023   HGB 14.3 05/14/2023   HCT 42.8 05/14/2023   PLT 187 05/14/2023   GLUCOSE 108 (H) 05/14/2023   CHOL 135 05/14/2023   TRIG 146 05/14/2023   HDL 48 05/14/2023   LDLDIRECT 147.0 01/16/2016   LDLCALC 62 05/14/2023   ALT 75 (H) 11/06/2023   AST 40 (H) 11/06/2023   NA 138 05/14/2023   K 4.4 05/14/2023   CL 102 05/14/2023   CREATININE 1.00 05/14/2023   BUN 14 05/14/2023   CO2 20 05/14/2023   TSH 2.040 12/12/2017   PSA 1.37 05/21/2023   INR 1.3  (H) 06/26/2022   HGBA1C 6.1 10/01/2016    Lab Results  Component Value Date   TSH 2.040 12/12/2017   Lab Results  Component Value Date   WBC 5.5 05/14/2023   HGB 14.3 05/14/2023   HCT 42.8 05/14/2023   MCV 93 05/14/2023   PLT 187 05/14/2023   Lab Results  Component Value Date   NA 138 05/14/2023   K 4.4 05/14/2023   CO2 20 05/14/2023   GLUCOSE 108 (H) 05/14/2023   BUN 14 05/14/2023   CREATININE 1.00 05/14/2023   BILITOT 1.0  11/06/2023   ALKPHOS 51 11/06/2023   AST 40 (H) 11/06/2023   ALT 75 (H) 11/06/2023   PROT 7.1 11/06/2023   ALBUMIN 4.8 11/06/2023   CALCIUM 9.1 05/14/2023   ANIONGAP 13 01/29/2018   EGFR 81 05/14/2023   GFR 80.03 02/20/2022   Lab Results  Component Value Date   CHOL 135 05/14/2023   Lab Results  Component Value Date   HDL 48 05/14/2023   Lab Results  Component Value Date   LDLCALC 62 05/14/2023   Lab Results  Component Value Date   TRIG 146 05/14/2023   Lab Results  Component Value Date   CHOLHDL 2.8 05/14/2023   Lab Results  Component Value Date   HGBA1C 6.1 10/01/2016       Assessment & Plan:  Preventative health care Assessment & Plan: Ghm utd Check labs See AVS Health Maintenance  Topic Date Due   Medicare Annual Wellness (AWV)  Never done   COVID-19 Vaccine (2 - Pfizer risk series) 04/16/2024 (Originally 04/08/2020)   Colonoscopy  08/15/2026   DTaP/Tdap/Td (4 - Td or Tdap) 03/20/2032   Pneumococcal Vaccine: 50+ Years  Completed   Influenza Vaccine  Completed   Hepatitis C Screening  Completed   Zoster Vaccines- Shingrix   Completed   Meningococcal B Vaccine  Aged Out      Primary osteoarthritis involving multiple joints -     Meloxicam ; Take 1 tablet (15 mg total) by mouth daily.  Dispense: 90 tablet; Refill: 0  Need for influenza vaccination -     Flu vaccine HIGH DOSE PF(Fluzone Trivalent)  Primary hypertension Assessment & Plan: Well controlled, no changes to meds. Encouraged heart healthy diet such  as the DASH diet and exercise as tolerated.    Orders: -     Lipid panel -     TSH -     Comprehensive metabolic panel with GFR -     CBC with Differential/Platelet  Mixed hyperlipidemia -     Lipid panel -     TSH -     Comprehensive metabolic panel with GFR  Persistent atrial fibrillation (HCC) Assessment & Plan: On eliquis    Atherosclerosis of native coronary artery of native heart without angina pectoris Assessment & Plan: Check labs  Per cardiology   Morbid obesity (HCC) Assessment & Plan: Pt is working out again [employer] insurance    Nocturia -     PSA  Leg cramps -     Magnesium  Atypical atrial flutter (HCC) Assessment & Plan: On eliquis  ---- per cardiology   HYPERLIPIDEMIA, FAMILIAL Assessment & Plan: Encourage heart healthy diet such as MIND or DASH diet, increase exercise, avoid trans fats, simple carbohydrates and processed foods, consider a krill or fish or flaxseed oil cap daily.  Pt is on repatha ---- per lipid clinic    Assessment and Plan Assessment & Plan Adult Wellness Visit   Routine adult wellness visit with no acute concerns. Blood pressure is well-controlled. He maintains regular exercise and dietary monitoring, achieving a weight loss of 12 pounds. No new significant health issues reported. Scheduled next year's physical appointment.  Immunization administration   Flu shot administered today. Discussed RSV vaccine for adults over 65, noting it is optional and available at pharmacies. He expressed concerns about mRNA vaccines. Consider RSV vaccine at pharmacy.  Primary generalized osteoarthritis   Intermittent joint pain managed with meloxicam  as needed, with no daily use required. Regular follow-up with orthopedic specialist Dr. Pietro scheduled for the end of the  year. Continue meloxicam  as needed and follow-up with Dr. Pietro.  Morbid obesity due to excess calories   Weight loss of 12 pounds achieved through exercise and dietary  monitoring using My Net Diary app. No significant changes in diet, but caloric intake is controlled. Continue current exercise regimen and use My Net Diary app for caloric monitoring.  Hiatal hernia   Approximately 5 cm, not warranting surgical intervention. Managed with pantoprazole  (Protonix ). Continue pantoprazole  (Protonix ).  Inguinal hernia, bilateral   Bilateral inguinal hernias present, occasionally causing discomfort. Previous surgical consultation advised monitoring unless symptoms worsen. Continue to monitor for changes in symptoms.  Venous stasis changes of lower extremity   Venous stasis changes noted in lower extremities, likely due to age and blood thinners. No swelling or significant discomfort reported. Continue to monitor for changes in symptoms.  General Health Maintenance   Regular eye exams conducted. Blood pressure and cholesterol levels monitored. No new vaccinations required per current guidelines. Continue regular eye exams and monitoring blood pressure and cholesterol levels.   Bran Aldridge R Lowne Chase, DO

## 2024-03-31 NOTE — Assessment & Plan Note (Signed)
 Encourage heart healthy diet such as MIND or DASH diet, increase exercise, avoid trans fats, simple carbohydrates and processed foods, consider a krill or fish or flaxseed oil cap daily.  Pt is on repatha ---- per lipid clinic

## 2024-03-31 NOTE — Assessment & Plan Note (Signed)
 On eliquis

## 2024-03-31 NOTE — Assessment & Plan Note (Signed)
 Well controlled, no changes to meds. Encouraged heart healthy diet such as the DASH diet and exercise as tolerated.

## 2024-04-09 ENCOUNTER — Ambulatory Visit: Payer: Self-pay | Admitting: Family Medicine

## 2024-05-03 ENCOUNTER — Other Ambulatory Visit: Payer: Self-pay | Admitting: Cardiology

## 2024-05-19 ENCOUNTER — Ambulatory Visit: Admitting: Cardiology

## 2024-05-22 ENCOUNTER — Other Ambulatory Visit: Payer: Self-pay | Admitting: Cardiology

## 2024-05-22 DIAGNOSIS — I484 Atypical atrial flutter: Secondary | ICD-10-CM

## 2024-05-22 DIAGNOSIS — I4891 Unspecified atrial fibrillation: Secondary | ICD-10-CM

## 2024-05-22 NOTE — Telephone Encounter (Signed)
 Eliquis  5mg  refill request received. Patient is 74 years old, weight-112.1kg, Crea-1.08 on 03/31/24, Diagnosis-Aflutter, and last seen by Dr. Inocencio on 09/10/23 and pending Dr. Pietro on 06/16/24 Dose is appropriate based on dosing criteria. Will send in refill to requested pharmacy.

## 2024-05-26 ENCOUNTER — Ambulatory Visit: Payer: PPO

## 2024-06-02 NOTE — Progress Notes (Unsigned)
 "    HPI: FU mitral valve repair secondary to severe mitral regurgitation in February 2009. Note preoperative catheterization showed a 40% left main, 30% LAD and 30% right coronary artery. He has also had atrial flutter ablation in March 2009.  Carotid dopplers in August of 2011 were normal. Patient has had elevated LFTs with statins in the past. Nuclear study 10/16 showed EF 49 with normal perfusion. TEE/DCCV of atrial flutter May 2019; images showed ejection fraction 35 to 40%, mildly dilated aortic root, mild mitral stenosis and mitral regurgitation; severe left atrial enlargement. Cardiac CTA January 2023 showed calcium score of 117 which is 73rd percentile and mild nonobstructive coronary disease: Normal caliber aorta.  Underwent repeat atrial flutter ablation May 30, 2021.  Echocardiogram December 2023 showed ejection fraction 45 to 50%, severe left atrial enlargement, status post mitral valve repair with mean gradient 3 mmHg and trace mitral regurgitation, mildly dilated aortic root at 42 mm.  Since I last saw him,   Current Outpatient Medications  Medication Sig Dispense Refill   amLODipine  (NORVASC ) 5 MG tablet Take 1 tablet (5 mg total) by mouth daily. 90 tablet 3   apixaban  (ELIQUIS ) 5 MG TABS tablet TAKE 1 TABLET BY MOUTH TWICE A DAY 180 tablet 2   carvedilol  (COREG ) 12.5 MG tablet TAKE 1 TABLET (12.5MG  TOTAL) BY MOUTH TWICE A DAY WITH MEALS 180 tablet 0   Cholecalciferol (VITAMIN D3) 50 MCG (2000 UT) capsule Take 2,000 Units by mouth daily.     Evolocumab  (REPATHA  SURECLICK) 140 MG/ML SOAJ INJECT 140 MG INTO THE SKIN EVERY 14 (FOURTEEN) DAYS. 6 mL 3   meloxicam  (MOBIC ) 15 MG tablet Take 1 tablet (15 mg total) by mouth daily. 90 tablet 0   Multiple Vitamins-Minerals (CENTRUM SILVER PO) Take 1 tablet by mouth daily.     pantoprazole  (PROTONIX ) 40 MG tablet Take 1 tablet (40 mg total) by mouth daily. 90 tablet 3   Saw Palmetto 500 MG CAPS Take 500 mg by mouth every other day. 585 mg      telmisartan  (MICARDIS ) 80 MG tablet TAKE 1 TABLET BY MOUTH EVERY DAY 90 tablet 3   Vitamin E 180 MG (400 UNIT) CAPS Take 400 Units by mouth daily.     No current facility-administered medications for this visit.     Past Medical History:  Diagnosis Date   Allergy    Atrial flutter (HCC)    s/p ablation   CAD (coronary artery disease)    cath 06/2007 40% LM   CVD (cerebrovascular disease)    59% right ICA, 49% left ICA; h/oTIA; fu study 8/11 with normal carotids   Dysrhythmia    Gait abnormality    Heart murmur    History of bronchitis    History of colonoscopy    Horseshoe kidney    Hyperlipidemia    Hypertension    corrected with medication, diet, exercise   MR (mitral regurgitation)    severe; mitral valve repair in march 2009   MVP (mitral valve prolapse)    Osteoarthritis    both knees   Shortness of breath dyspnea    increased exertion     Past Surgical History:  Procedure Laterality Date   A-FLUTTER ABLATION N/A 01/29/2018   Procedure: A-FLUTTER ABLATION;  Surgeon: Inocencio Soyla Lunger, MD;  Location: [facility] INVASIVE CV [facility];  Service: Cardiovascular;  Laterality: N/A;   ATRIAL FIBRILLATION ABLATION N/A 01/21/2019   Procedure: ATRIAL FIBRILLATION ABLATION;  Surgeon: Inocencio Soyla Lunger, MD;  Location: [facility]  INVASIVE CV LAB;  Service: Cardiovascular;  Laterality: N/A;   ATRIAL FIBRILLATION ABLATION N/A 05/30/2021   Procedure: ATRIAL FIBRILLATION ABLATION;  Surgeon: Inocencio Soyla Lunger, MD;  Location: [facility] INVASIVE CV LAB;  Service: Cardiovascular;  Laterality: N/A;   BACK SURGERY     secondary to ruptured disc   CARDIAC CATHETERIZATION     CARDIOVERSION N/A 09/23/2017   Procedure: CARDIOVERSION;  Surgeon: Pietro Redell RAMAN, MD;  Location: [facility] ENDOSCOPY;  Service: Cardiovascular;  Laterality: N/A;   COLONOSCOPY     heart ablation     JOINT REPLACEMENT     bilateral knees   MITRAL VALVE REPAIR  06/22/2007   SPINE SURGERY     TEE WITHOUT CARDIOVERSION N/A 09/23/2017    Procedure: TRANSESOPHAGEAL ECHOCARDIOGRAM (TEE);  Surgeon: Pietro Redell RAMAN, MD;  Location: [facility] ENDOSCOPY;  Service: Cardiovascular;  Laterality: N/A;   TONSILLECTOMY  05/21/1958   TOTAL KNEE ARTHROPLASTY Bilateral 03/09/2015   Procedure: TOTAL KNEE BILATERAL;  Surgeon: Dempsey Moan, MD;  Location: [facility] ORS;  Service: Orthopedics;  Laterality: Bilateral;    Social History   Socioeconomic History   Marital status: Married    Spouse name: Not on file   Number of children: 2   Years of education: Not on file   Highest education level: Bachelor's degree (e.g., BA, AB, BS)  Occupational History   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   Occupation: retired  Tobacco Use   Smoking status: Never   Smokeless tobacco: Never  Vaping Use   Vaping status: Never Used  Substance and Sexual Activity   Alcohol use: Not Currently    Comment: occasional 1-2 wine drinks a month   Drug use: Never   Sexual activity: Not Currently    Partners: Female  Other Topics Concern   Not on file  Social History Narrative   Exercise- gym 3x a week   Social Drivers of Health   Tobacco Use: Low Risk (03/31/2024)   Patient History    Smoking Tobacco Use: Never    Smokeless Tobacco Use: Never    Passive Exposure: Not on file  Financial Resource Strain: Low Risk (03/24/2024)   Overall Financial Resource Strain (CARDIA)    Difficulty of Paying Living Expenses: Not very hard  Food Insecurity: No Food Insecurity (03/24/2024)   Epic    Worried About Radiation Protection Practitioner of Food in the Last Year: Never true    Ran Out of Food in the Last Year: Never true  Transportation Needs: Unknown (03/24/2024)   Epic    Lack of Transportation (Medical): Not on file    Lack of Transportation (Non-Medical): No  Physical Activity: Insufficiently Active (03/24/2024)   Exercise Vital Sign    Days of Exercise per Week: 3 days    Minutes of Exercise per Session: 20 min  Stress: No Stress Concern Present (03/24/2024)    [employer] of Occupational Health - Occupational Stress Questionnaire    Feeling of Stress: Not at all  Social Connections: Moderately Integrated (03/24/2024)   Social Connection and Isolation Panel    Frequency of Communication with Friends and Family: Once a week    Frequency of Social Gatherings with Friends and Family: Once a week    Attends Religious Services: More than 4 times per year    Active Member of [employer] or Organizations: Yes    Attends Banker Meetings: 1 to 4 times per year    Marital Status: Married  Catering Manager Violence: Not  At Risk (05/21/2023)   Humiliation, Afraid, Rape, and Kick questionnaire    Fear of Current or Ex-Partner: No    Emotionally Abused: No    Physically Abused: No    Sexually Abused: No  Depression (PHQ2-9): Low Risk (03/31/2024)   Depression (PHQ2-9)    PHQ-2 Score: 0  Alcohol Screen: Low Risk (03/24/2024)   Alcohol Screen    Last Alcohol Screening Score (AUDIT): 1  Housing: Low Risk (03/24/2024)   Epic    Unable to Pay for Housing in the Last Year: No    Number of Times Moved in the Last Year: 0    Homeless in the Last Year: No  Utilities: Not At Risk (05/21/2023)   AHC Utilities    Threatened with loss of utilities: No  Health Literacy: Adequate Health Literacy (05/21/2023)   B1300 Health Literacy    Frequency of need for help with medical instructions: Never    Family History  Problem Relation Age of Onset   Liver disease Father        cancer   Hyperlipidemia Father    Liver cancer Father    Colitis Daughter    Crohn's disease Daughter    Diabetes Other    Colon cancer Neg Hx    Esophageal cancer Neg Hx    Rectal cancer Neg Hx    Stomach cancer Neg Hx    Colon polyps Neg Hx     ROS: no fevers or chills, productive cough, hemoptysis, dysphasia, odynophagia, melena, hematochezia, dysuria, hematuria, rash, seizure activity, orthopnea, PND, pedal edema, claudication. Remaining systems are  negative.  Physical Exam: Well-developed well-nourished in no acute distress.  Skin is warm and dry.  HEENT is normal.  Neck is supple.  Chest is clear to auscultation with normal expansion.  Cardiovascular exam is regular rate and rhythm.  Abdominal exam nontender or distended. No masses palpated. Extremities show no edema. neuro grossly intact  ECG- personally reviewed  A/P  1 history of mitral valve repair-continue SBE prophylaxis.  2 status post atrial flutter ablation-patient remains in sinus rhythm.  Continue apixaban .  3 hyperlipidemia-continue Repatha  as patient is intolerant to statins.  4 hypertension-blood pressure controlled.  Continue present medical regimen.  5 coronary artery disease-mild on previous catheterization.  6 cardiomyopathy-LV function was mildly reduced on previous echocardiogram.  Continue ARB and beta-blocker.  Repeat echocardiogram.  7 obesity-we again discussed the importance of weight loss.  Redell Shallow, MD    "

## 2024-06-16 ENCOUNTER — Ambulatory Visit: Admitting: Cardiology

## 2024-06-16 ENCOUNTER — Encounter: Payer: Self-pay | Admitting: Cardiology

## 2024-06-16 ENCOUNTER — Ambulatory Visit: Attending: Cardiology | Admitting: Cardiology

## 2024-06-16 VITALS — BP 158/89 | HR 59 | Ht 70.0 in | Wt 246.1 lb

## 2024-06-16 DIAGNOSIS — Z9889 Other specified postprocedural states: Secondary | ICD-10-CM

## 2024-06-16 DIAGNOSIS — E785 Hyperlipidemia, unspecified: Secondary | ICD-10-CM

## 2024-06-16 DIAGNOSIS — I484 Atypical atrial flutter: Secondary | ICD-10-CM

## 2024-06-16 DIAGNOSIS — I10 Essential (primary) hypertension: Secondary | ICD-10-CM

## 2024-06-16 DIAGNOSIS — I2510 Atherosclerotic heart disease of native coronary artery without angina pectoris: Secondary | ICD-10-CM | POA: Diagnosis not present

## 2024-06-16 NOTE — Progress Notes (Signed)
 "    HPI: FU mitral valve repair secondary to severe mitral regurgitation in February 2009. Note preoperative catheterization showed a 40% left main, 30% LAD and 30% right coronary artery. He has also had atrial flutter ablation in March 2009.  Carotid dopplers in August of 2011 were normal. Patient has had elevated LFTs with statins in the past. Nuclear study 10/16 showed EF 49 with normal perfusion. TEE/DCCV of atrial flutter May 2019; images showed ejection fraction 35 to 40%, mildly dilated aortic root, mild mitral stenosis and mitral regurgitation; severe left atrial enlargement. Cardiac CTA January 2023 showed calcium score of 117 which is 73rd percentile and mild nonobstructive coronary disease: Normal caliber aorta.  Underwent repeat atrial flutter ablation May 30, 2021.  Echocardiogram December 2023 showed ejection fraction 45 to 50%, severe left atrial enlargement, status post mitral valve repair with mean gradient 3 mmHg and trace mitral regurgitation, mildly dilated aortic root at 42 mm.  Since I last saw him, he notes some dyspnea on exertion.  No orthopnea, PND, pedal edema, chest pain or syncope.  Current Outpatient Medications  Medication Sig Dispense Refill   amLODipine  (NORVASC ) 5 MG tablet Take 1 tablet (5 mg total) by mouth daily. 90 tablet 3   apixaban  (ELIQUIS ) 5 MG TABS tablet TAKE 1 TABLET BY MOUTH TWICE A DAY 180 tablet 2   carvedilol  (COREG ) 12.5 MG tablet TAKE 1 TABLET (12.5MG  TOTAL) BY MOUTH TWICE A DAY WITH MEALS 180 tablet 0   Cholecalciferol (VITAMIN D3) 50 MCG (2000 UT) capsule Take 2,000 Units by mouth daily. (Patient taking differently: Take 1,000 Units by mouth daily.)     Evolocumab  (REPATHA  SURECLICK) 140 MG/ML SOAJ INJECT 140 MG INTO THE SKIN EVERY 14 (FOURTEEN) DAYS. 6 mL 3   meloxicam  (MOBIC ) 15 MG tablet Take 1 tablet (15 mg total) by mouth daily. 90 tablet 0   Multiple Vitamins-Minerals (CENTRUM SILVER PO) Take 1 tablet by mouth daily.     pantoprazole   (PROTONIX ) 40 MG tablet Take 1 tablet (40 mg total) by mouth daily. 90 tablet 3   Saw Palmetto 500 MG CAPS Take 500 mg by mouth every other day. 585 mg     telmisartan  (MICARDIS ) 80 MG tablet TAKE 1 TABLET BY MOUTH EVERY DAY 90 tablet 3   Vitamin E 180 MG (400 UNIT) CAPS Take 400 Units by mouth daily.     No current facility-administered medications for this visit.     Past Medical History:  Diagnosis Date   Allergy    Atrial flutter (HCC)    s/p ablation   CAD (coronary artery disease)    cath 06/2007 40% LM   CVD (cerebrovascular disease)    59% right ICA, 49% left ICA; h/oTIA; fu study 8/11 with normal carotids   Dysrhythmia    Gait abnormality    Heart murmur    History of bronchitis    History of colonoscopy    Horseshoe kidney    Hyperlipidemia    Hypertension    corrected with medication, diet, exercise   MR (mitral regurgitation)    severe; mitral valve repair in march 2009   MVP (mitral valve prolapse)    Osteoarthritis    both knees   Shortness of breath dyspnea    increased exertion     Past Surgical History:  Procedure Laterality Date   A-FLUTTER ABLATION N/A 01/29/2018   Procedure: A-FLUTTER ABLATION;  Surgeon: Inocencio Soyla Lunger, MD;  Location: [facility] IN[facility] CV [facility];  Service: Cardiovascular;  Laterality: N/A;   ATRIAL FIBRILLATION ABLATION N/A 01/21/2019   Procedure: ATRIAL FIBRILLATION ABLATION;  Surgeon: Inocencio Soyla Lunger, MD;  Location: [facility];  Service: Cardiovascular;  Laterality: N/A;   ATRIAL FIBRILLATION ABLATION N/A 05/30/2021   Procedure: ATRIAL FIBRILLATION ABLATION;  Surgeon: Inocencio Soyla Lunger, MD;  Location: [facility];  Service: Cardiovascular;  Laterality: N/A;   BACK SURGERY     secondary to ruptured disc   CARDIAC CATHETERIZATION     CARDIOVERSION N/A 09/23/2017   Procedure: CARDIOVERSION;  Surgeon: Pietro Redell RAMAN, MD;  Location: [facility] ENDOSCOPY;  Service: Cardiovascular;  Laterality: N/A;   COLONOSCOPY      heart ablation     JOINT REPLACEMENT     bilateral knees   MITRAL VALVE REPAIR  06/22/2007   SPINE SURGERY     TEE WITHOUT CARDIOVERSION N/A 09/23/2017   Procedure: TRANSESOPHAGEAL ECHOCARDIOGRAM (TEE);  Surgeon: Pietro Redell RAMAN, MD;  Location: [facility] ENDOSCOPY;  Service: Cardiovascular;  Laterality: N/A;   TONSILLECTOMY  05/21/1958   TOTAL KNEE ARTHROPLASTY Bilateral 03/09/2015   Procedure: TOTAL KNEE BILATERAL;  Surgeon: Dempsey Moan, MD;  Location: [facility] ORS;  Service: Orthopedics;  Laterality: Bilateral;    Social History   Socioeconomic History   Marital status: Married    Spouse name: Not on file   Number of children: 2   Years of education: Not on file   Highest education level: Bachelor's degree (e.g., BA, AB, BS)  Occupational History   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   Occupation: retired  Tobacco Use   Smoking status: Never   Smokeless tobacco: Never  Vaping Use   Vaping status: Never Used  Substance and Sexual Activity   Alcohol use: Not Currently    Comment: occasional 1-2 wine drinks a month   Drug use: Never   Sexual activity: Not Currently    Partners: Female  Other Topics Concern   Not on file  Social History Narrative   Exercise- gym 3x a week   Social Drivers of Health   Tobacco Use: Low Risk (06/16/2024)   Patient History    Smoking Tobacco Use: Never    Smokeless Tobacco Use: Never    Passive Exposure: Not on file  Financial Resource Strain: Low Risk (03/24/2024)   Overall Financial Resource Strain (CARDIA)    Difficulty of Paying Living Expenses: Not very hard  Food Insecurity: No Food Insecurity (03/24/2024)   Epic    Worried About Radiation Protection Practitioner of Food in the Last Year: Never true    Ran Out of Food in the Last Year: Never true  Transportation Needs: Unknown (03/24/2024)   Epic    Lack of Transportation (Medical): Not on file    Lack of Transportation (Non-Medical): No  Physical Activity: Insufficiently Active (03/24/2024)    Exercise Vital Sign    Days of Exercise per Week: 3 days    Minutes of Exercise per Session: 20 min  Stress: No Stress Concern Present (03/24/2024)   [employer] of Occupational Health - Occupational Stress Questionnaire    Feeling of Stress: Not at all  Social Connections: Moderately Integrated (03/24/2024)   Social Connection and Isolation Panel    Frequency of Communication with Friends and Family: Once a week    Frequency of Social Gatherings with Friends and Family: Once a week    Attends Religious Services: More than 4 times per year    Active Member of [employer] or Organizations: Yes  Attends Banker Meetings: 1 to 4 times per year    Marital Status: Married  Catering Manager Violence: Not At Risk (05/21/2023)   Humiliation, Afraid, Rape, and Kick questionnaire    Fear of Current or Ex-Partner: No    Emotionally Abused: No    Physically Abused: No    Sexually Abused: No  Depression (PHQ2-9): Low Risk (03/31/2024)   Depression (PHQ2-9)    PHQ-2 Score: 0  Alcohol Screen: Low Risk (03/24/2024)   Alcohol Screen    Last Alcohol Screening Score (AUDIT): 1  Housing: Low Risk (03/24/2024)   Epic    Unable to Pay for Housing in the Last Year: No    Number of Times Moved in the Last Year: 0    Homeless in the Last Year: No  Utilities: Not At Risk (05/21/2023)   AHC Utilities    Threatened with loss of utilities: No  Health Literacy: Adequate Health Literacy (05/21/2023)   B1300 Health Literacy    Frequency of need for help with medical instructions: Never    Family History  Problem Relation Age of Onset   Liver disease Father        cancer   Hyperlipidemia Father    Liver cancer Father    Colitis Daughter    Crohn's disease Daughter    Diabetes Other    Colon cancer Neg Hx    Esophageal cancer Neg Hx    Rectal cancer Neg Hx    Stomach cancer Neg Hx    Colon polyps Neg Hx     ROS: no fevers or chills, productive cough, hemoptysis, dysphasia,  odynophagia, melena, hematochezia, dysuria, hematuria, rash, seizure activity, orthopnea, PND, pedal edema, claudication. Remaining systems are negative.  Physical Exam: Well-developed well-nourished in no acute distress.  Skin is warm and dry.  HEENT is normal.  Neck is supple.  Chest is clear to auscultation with normal expansion.  Cardiovascular exam is regular rate and rhythm.  Abdominal exam nontender or distended. No masses palpated. Extremities show no edema. neuro grossly intact  ECG- personally reviewed  A/P  1 history of mitral valve repair-continue SBE prophylaxis.  2 status post atrial flutter ablation-patient remains in sinus rhythm on exam.  Continue apixaban .  3 hyperlipidemia-continue Repatha  as patient is intolerant to statins and Zetia.  4 hypertension-blood pressure mildly elevated but controlled at home.  Continue present medications and follow-up.  5 coronary artery disease-mild on previous catheterization.  6 cardiomyopathy-LV function was mildly reduced on previous echocardiogram.  Continue ARB and beta-blocker.  He describes some increased dyspnea on exertion.  Repeat echocardiogram.  7 obesity-we again discussed the importance of weight loss.  Redell Shallow, MD    "

## 2024-06-16 NOTE — Patient Instructions (Signed)
 Medication Instructions:  No changes *If you need a refill on your cardiac medications before your next appointment, please call your pharmacy*  Lab Work: None ordered If you have labs (blood work) drawn today and your tests are completely normal, you will receive your results only by: MyChart Message (if you have MyChart) OR A paper copy in the mail If you have any lab test that is abnormal or we need to change your treatment, we will call you to review the results.  Testing/Procedures: Your physician has requested that you have an echocardiogram. Echocardiography is a painless test that uses sound waves to create images of your heart. It provides your doctor with information about the size and shape of your heart and how well your hearts chambers and valves are working. This procedure takes approximately one hour. There are no restrictions for this procedure. Please do NOT wear cologne, perfume, aftershave, or lotions (deodorant is allowed). Please arrive 15 minutes prior to your appointment time.  Please note: We ask at that you not bring children with you during ultrasound (echo/ vascular) testing. Due to room size and safety concerns, children are not allowed in the ultrasound rooms during exams. Our front office staff cannot provide observation of children in our lobby area while testing is being conducted. An adult accompanying a patient to their appointment will only be allowed in the ultrasound room at the discretion of the ultrasound technician under special circumstances. We apologize for any inconvenience.   Follow-Up: At [facility], you and your health needs are our priority.  As part of our continuing mission to provide you with exceptional heart care, our providers are all part of one team.  This team includes your primary Cardiologist (physician) and Advanced Practice Providers or APPs (Physician Assistants and Nurse Practitioners) who all work together to provide you  with the care you need, when you need it.  Your next appointment:   1 year(s)  Provider:   Redell Shallow, MD    We recommend signing up for the patient portal called MyChart.  Sign up information is provided on this After Visit Summary.  MyChart is used to connect with patients for Virtual Visits (Telemedicine).  Patients are able to view lab/test results, encounter notes, upcoming appointments, etc.  Non-urgent messages can be sent to your provider as well.   To learn more about what you can do with MyChart, go to [email or web address].

## 2024-07-16 ENCOUNTER — Ambulatory Visit (HOSPITAL_COMMUNITY)

## 2024-09-10 ENCOUNTER — Ambulatory Visit

## 2024-09-17 ENCOUNTER — Ambulatory Visit: Admitting: Cardiology

## 2025-04-01 ENCOUNTER — Encounter: Admitting: Family Medicine
